# Patient Record
Sex: Female | Born: 1935 | Race: White | Hispanic: No | Smoking: Never smoker
Health system: Southern US, Community
[De-identification: ages and names within clinical notes are randomized; demographics above are authoritative.]

## PROBLEM LIST (undated history)

## (undated) DIAGNOSIS — Z950 Presence of cardiac pacemaker: Secondary | ICD-10-CM

## (undated) DIAGNOSIS — E119 Type 2 diabetes mellitus without complications: Secondary | ICD-10-CM

## (undated) DIAGNOSIS — I10 Essential (primary) hypertension: Secondary | ICD-10-CM

## (undated) DIAGNOSIS — L909 Atrophic disorder of skin, unspecified: Secondary | ICD-10-CM

## (undated) DIAGNOSIS — R7989 Other specified abnormal findings of blood chemistry: Secondary | ICD-10-CM

## (undated) DIAGNOSIS — I442 Atrioventricular block, complete: Secondary | ICD-10-CM

## (undated) DIAGNOSIS — R238 Other skin changes: Secondary | ICD-10-CM

## (undated) DIAGNOSIS — R945 Abnormal results of liver function studies: Secondary | ICD-10-CM

## (undated) DIAGNOSIS — F0390 Unspecified dementia without behavioral disturbance: Secondary | ICD-10-CM

## (undated) DIAGNOSIS — I493 Ventricular premature depolarization: Secondary | ICD-10-CM

## (undated) DIAGNOSIS — I2510 Atherosclerotic heart disease of native coronary artery without angina pectoris: Secondary | ICD-10-CM

## (undated) DIAGNOSIS — I482 Chronic atrial fibrillation, unspecified: Secondary | ICD-10-CM

## (undated) DIAGNOSIS — I255 Ischemic cardiomyopathy: Secondary | ICD-10-CM

## (undated) DIAGNOSIS — E785 Hyperlipidemia, unspecified: Secondary | ICD-10-CM

## (undated) HISTORY — DX: Ischemic cardiomyopathy: I25.5

## (undated) HISTORY — DX: Atherosclerotic heart disease of native coronary artery without angina pectoris: I25.10

## (undated) HISTORY — DX: Abnormal results of liver function studies: R94.5

## (undated) HISTORY — DX: Other specified abnormal findings of blood chemistry: R79.89

## (undated) HISTORY — DX: Hyperlipidemia, unspecified: E78.5

## (undated) HISTORY — DX: Atrioventricular block, complete: I44.2

## (undated) HISTORY — DX: Ventricular premature depolarization: I49.3

## (undated) HISTORY — DX: Essential (primary) hypertension: I10

## (undated) HISTORY — DX: Type 2 diabetes mellitus without complications: E11.9

## (undated) HISTORY — PX: OTHER SURGICAL HISTORY: SHX169

## (undated) HISTORY — DX: Chronic atrial fibrillation, unspecified: I48.20

## (undated) HISTORY — DX: Presence of cardiac pacemaker: Z95.0

---

## 1983-04-16 HISTORY — PX: CORONARY ARTERY BYPASS GRAFT: SHX141

## 1996-04-15 HISTORY — PX: CORONARY ARTERY BYPASS GRAFT: SHX141

## 1998-12-26 ENCOUNTER — Other Ambulatory Visit: Admission: RE | Admit: 1998-12-26 | Discharge: 1998-12-26 | Payer: Self-pay | Admitting: Internal Medicine

## 1999-04-02 ENCOUNTER — Encounter: Admission: RE | Admit: 1999-04-02 | Discharge: 1999-04-02 | Payer: Self-pay | Admitting: Internal Medicine

## 1999-04-02 ENCOUNTER — Encounter: Payer: Self-pay | Admitting: Internal Medicine

## 1999-09-20 ENCOUNTER — Encounter: Admission: RE | Admit: 1999-09-20 | Discharge: 1999-09-20 | Payer: Self-pay | Admitting: Internal Medicine

## 1999-09-20 ENCOUNTER — Encounter: Payer: Self-pay | Admitting: Internal Medicine

## 2000-03-24 ENCOUNTER — Encounter: Admission: RE | Admit: 2000-03-24 | Discharge: 2000-03-24 | Payer: Self-pay | Admitting: Internal Medicine

## 2000-03-24 ENCOUNTER — Encounter: Payer: Self-pay | Admitting: Internal Medicine

## 2001-02-03 ENCOUNTER — Other Ambulatory Visit: Admission: RE | Admit: 2001-02-03 | Discharge: 2001-02-03 | Payer: Self-pay | Admitting: Internal Medicine

## 2001-03-25 ENCOUNTER — Encounter: Payer: Self-pay | Admitting: Internal Medicine

## 2001-03-25 ENCOUNTER — Encounter: Admission: RE | Admit: 2001-03-25 | Discharge: 2001-03-25 | Payer: Self-pay | Admitting: Internal Medicine

## 2002-01-04 ENCOUNTER — Encounter: Payer: Self-pay | Admitting: Cardiology

## 2002-01-04 ENCOUNTER — Ambulatory Visit (HOSPITAL_COMMUNITY): Admission: RE | Admit: 2002-01-04 | Discharge: 2002-01-04 | Payer: Self-pay | Admitting: Cardiology

## 2002-03-09 ENCOUNTER — Inpatient Hospital Stay (HOSPITAL_COMMUNITY): Admission: RE | Admit: 2002-03-09 | Discharge: 2002-03-19 | Payer: Self-pay | Admitting: Cardiology

## 2002-03-12 ENCOUNTER — Encounter: Payer: Self-pay | Admitting: Internal Medicine

## 2002-03-16 ENCOUNTER — Encounter: Payer: Self-pay | Admitting: Internal Medicine

## 2002-08-19 ENCOUNTER — Encounter: Payer: Self-pay | Admitting: Internal Medicine

## 2002-08-19 ENCOUNTER — Encounter: Admission: RE | Admit: 2002-08-19 | Discharge: 2002-08-19 | Payer: Self-pay | Admitting: Internal Medicine

## 2003-01-04 ENCOUNTER — Encounter: Admission: RE | Admit: 2003-01-04 | Discharge: 2003-01-04 | Payer: Self-pay | Admitting: Specialist

## 2003-01-04 ENCOUNTER — Encounter: Payer: Self-pay | Admitting: Specialist

## 2003-01-11 ENCOUNTER — Emergency Department (HOSPITAL_COMMUNITY): Admission: EM | Admit: 2003-01-11 | Discharge: 2003-01-11 | Payer: Self-pay | Admitting: *Deleted

## 2003-01-11 ENCOUNTER — Encounter: Payer: Self-pay | Admitting: *Deleted

## 2003-11-15 ENCOUNTER — Encounter: Admission: RE | Admit: 2003-11-15 | Discharge: 2003-11-15 | Payer: Self-pay | Admitting: Internal Medicine

## 2004-02-20 ENCOUNTER — Ambulatory Visit: Payer: Self-pay | Admitting: Cardiology

## 2004-03-07 ENCOUNTER — Ambulatory Visit: Payer: Self-pay

## 2004-03-14 ENCOUNTER — Ambulatory Visit: Payer: Self-pay | Admitting: Cardiology

## 2004-04-04 ENCOUNTER — Ambulatory Visit: Payer: Self-pay | Admitting: Cardiology

## 2004-04-30 ENCOUNTER — Ambulatory Visit: Payer: Self-pay | Admitting: Cardiology

## 2004-05-02 ENCOUNTER — Ambulatory Visit: Payer: Self-pay | Admitting: Cardiology

## 2004-05-30 ENCOUNTER — Ambulatory Visit: Payer: Self-pay | Admitting: Cardiovascular Disease

## 2004-06-27 ENCOUNTER — Ambulatory Visit: Payer: Self-pay | Admitting: *Deleted

## 2004-07-18 ENCOUNTER — Ambulatory Visit: Payer: Self-pay | Admitting: Cardiology

## 2004-07-18 ENCOUNTER — Ambulatory Visit: Payer: Self-pay | Admitting: *Deleted

## 2004-07-20 ENCOUNTER — Ambulatory Visit: Payer: Self-pay | Admitting: Cardiology

## 2004-08-06 ENCOUNTER — Ambulatory Visit: Payer: Self-pay | Admitting: Cardiology

## 2004-08-16 ENCOUNTER — Ambulatory Visit: Payer: Self-pay | Admitting: Cardiology

## 2004-08-27 ENCOUNTER — Ambulatory Visit: Payer: Self-pay | Admitting: Cardiology

## 2004-09-13 ENCOUNTER — Ambulatory Visit: Payer: Self-pay | Admitting: Cardiology

## 2004-09-24 ENCOUNTER — Ambulatory Visit: Payer: Self-pay | Admitting: Internal Medicine

## 2004-10-01 ENCOUNTER — Ambulatory Visit: Payer: Self-pay | Admitting: Cardiology

## 2004-10-15 ENCOUNTER — Ambulatory Visit: Payer: Self-pay | Admitting: Cardiology

## 2004-10-17 ENCOUNTER — Ambulatory Visit: Payer: Self-pay | Admitting: Cardiology

## 2004-11-12 ENCOUNTER — Ambulatory Visit: Payer: Self-pay | Admitting: Cardiology

## 2004-12-10 ENCOUNTER — Ambulatory Visit: Payer: Self-pay | Admitting: Cardiology

## 2004-12-18 ENCOUNTER — Ambulatory Visit: Payer: Self-pay | Admitting: Cardiology

## 2005-01-03 ENCOUNTER — Encounter: Admission: RE | Admit: 2005-01-03 | Discharge: 2005-01-03 | Payer: Self-pay | Admitting: Internal Medicine

## 2005-01-08 ENCOUNTER — Ambulatory Visit: Payer: Self-pay | Admitting: Cardiology

## 2005-01-15 ENCOUNTER — Ambulatory Visit: Payer: Self-pay | Admitting: Cardiology

## 2005-01-16 ENCOUNTER — Ambulatory Visit: Payer: Self-pay | Admitting: Cardiology

## 2005-01-25 ENCOUNTER — Ambulatory Visit: Payer: Self-pay

## 2005-02-05 ENCOUNTER — Ambulatory Visit: Payer: Self-pay | Admitting: Cardiology

## 2005-02-08 ENCOUNTER — Ambulatory Visit: Payer: Self-pay | Admitting: Cardiology

## 2005-02-12 ENCOUNTER — Ambulatory Visit: Payer: Self-pay | Admitting: Cardiology

## 2005-02-13 ENCOUNTER — Inpatient Hospital Stay (HOSPITAL_BASED_OUTPATIENT_CLINIC_OR_DEPARTMENT_OTHER): Admission: RE | Admit: 2005-02-13 | Discharge: 2005-02-13 | Payer: Self-pay | Admitting: Cardiology

## 2005-02-13 ENCOUNTER — Ambulatory Visit: Payer: Self-pay | Admitting: Cardiology

## 2005-02-18 ENCOUNTER — Ambulatory Visit: Payer: Self-pay

## 2005-02-19 ENCOUNTER — Ambulatory Visit: Payer: Self-pay | Admitting: Internal Medicine

## 2005-02-20 ENCOUNTER — Ambulatory Visit: Payer: Self-pay | Admitting: Cardiology

## 2005-03-04 ENCOUNTER — Ambulatory Visit: Payer: Self-pay | Admitting: Cardiology

## 2005-03-18 ENCOUNTER — Ambulatory Visit: Payer: Self-pay | Admitting: Cardiology

## 2005-04-03 ENCOUNTER — Ambulatory Visit: Payer: Self-pay | Admitting: Cardiology

## 2005-04-03 ENCOUNTER — Ambulatory Visit: Payer: Self-pay | Admitting: Internal Medicine

## 2005-04-12 ENCOUNTER — Ambulatory Visit: Payer: Self-pay | Admitting: Cardiology

## 2005-05-01 ENCOUNTER — Ambulatory Visit: Payer: Self-pay | Admitting: Cardiology

## 2005-05-07 ENCOUNTER — Ambulatory Visit: Payer: Self-pay | Admitting: Cardiology

## 2005-05-17 ENCOUNTER — Ambulatory Visit: Payer: Self-pay | Admitting: Cardiology

## 2005-06-12 ENCOUNTER — Ambulatory Visit: Payer: Self-pay | Admitting: Internal Medicine

## 2005-07-09 ENCOUNTER — Ambulatory Visit: Payer: Self-pay | Admitting: Cardiology

## 2005-07-10 ENCOUNTER — Ambulatory Visit: Payer: Self-pay | Admitting: *Deleted

## 2005-07-28 ENCOUNTER — Emergency Department (HOSPITAL_COMMUNITY): Admission: EM | Admit: 2005-07-28 | Discharge: 2005-07-28 | Payer: Self-pay | Admitting: Emergency Medicine

## 2005-07-31 ENCOUNTER — Ambulatory Visit: Payer: Self-pay | Admitting: *Deleted

## 2005-08-12 ENCOUNTER — Ambulatory Visit: Payer: Self-pay | Admitting: Cardiology

## 2005-08-16 ENCOUNTER — Ambulatory Visit (HOSPITAL_COMMUNITY): Admission: RE | Admit: 2005-08-16 | Discharge: 2005-08-16 | Payer: Self-pay | Admitting: Cardiology

## 2005-08-20 ENCOUNTER — Ambulatory Visit (HOSPITAL_COMMUNITY): Admission: RE | Admit: 2005-08-20 | Discharge: 2005-08-20 | Payer: Self-pay | Admitting: Cardiology

## 2005-08-20 ENCOUNTER — Ambulatory Visit: Payer: Self-pay | Admitting: Cardiology

## 2005-09-03 ENCOUNTER — Ambulatory Visit: Payer: Self-pay | Admitting: *Deleted

## 2005-09-13 ENCOUNTER — Ambulatory Visit: Payer: Self-pay | Admitting: Cardiology

## 2005-09-27 ENCOUNTER — Ambulatory Visit: Payer: Self-pay | Admitting: Cardiology

## 2005-10-18 ENCOUNTER — Ambulatory Visit: Payer: Self-pay | Admitting: Internal Medicine

## 2005-11-15 ENCOUNTER — Ambulatory Visit: Payer: Self-pay | Admitting: Cardiology

## 2005-12-01 ENCOUNTER — Emergency Department (HOSPITAL_COMMUNITY): Admission: AD | Admit: 2005-12-01 | Discharge: 2005-12-01 | Payer: Self-pay | Admitting: Family Medicine

## 2005-12-13 ENCOUNTER — Ambulatory Visit: Payer: Self-pay | Admitting: Cardiology

## 2006-01-03 ENCOUNTER — Ambulatory Visit: Payer: Self-pay | Admitting: Cardiology

## 2006-01-06 ENCOUNTER — Encounter: Admission: RE | Admit: 2006-01-06 | Discharge: 2006-01-06 | Payer: Self-pay | Admitting: Internal Medicine

## 2006-01-29 ENCOUNTER — Ambulatory Visit: Payer: Self-pay | Admitting: Internal Medicine

## 2006-02-14 ENCOUNTER — Ambulatory Visit: Payer: Self-pay | Admitting: Cardiology

## 2006-02-19 ENCOUNTER — Emergency Department (HOSPITAL_COMMUNITY): Admission: EM | Admit: 2006-02-19 | Discharge: 2006-02-19 | Payer: Self-pay | Admitting: Emergency Medicine

## 2006-02-25 ENCOUNTER — Ambulatory Visit: Payer: Self-pay | Admitting: *Deleted

## 2006-03-11 ENCOUNTER — Ambulatory Visit: Payer: Self-pay | Admitting: Cardiology

## 2006-03-24 ENCOUNTER — Ambulatory Visit: Payer: Self-pay | Admitting: Cardiology

## 2006-04-01 ENCOUNTER — Ambulatory Visit: Payer: Self-pay | Admitting: Cardiology

## 2006-04-16 ENCOUNTER — Ambulatory Visit: Payer: Self-pay | Admitting: *Deleted

## 2006-04-23 ENCOUNTER — Ambulatory Visit: Payer: Self-pay | Admitting: Cardiology

## 2006-05-14 ENCOUNTER — Ambulatory Visit: Payer: Self-pay | Admitting: Internal Medicine

## 2006-05-21 ENCOUNTER — Ambulatory Visit: Payer: Self-pay | Admitting: Cardiology

## 2006-06-04 ENCOUNTER — Ambulatory Visit: Payer: Self-pay | Admitting: Cardiovascular Disease

## 2006-06-18 ENCOUNTER — Ambulatory Visit: Payer: Self-pay | Admitting: *Deleted

## 2006-07-09 ENCOUNTER — Ambulatory Visit: Payer: Self-pay | Admitting: Cardiovascular Disease

## 2006-07-18 ENCOUNTER — Ambulatory Visit: Payer: Self-pay | Admitting: Cardiology

## 2006-08-01 ENCOUNTER — Ambulatory Visit: Payer: Self-pay | Admitting: Cardiology

## 2006-08-11 ENCOUNTER — Ambulatory Visit: Payer: Self-pay | Admitting: Cardiology

## 2006-08-29 ENCOUNTER — Ambulatory Visit: Payer: Self-pay | Admitting: Cardiology

## 2006-09-16 ENCOUNTER — Ambulatory Visit: Payer: Self-pay

## 2006-09-16 ENCOUNTER — Ambulatory Visit: Payer: Self-pay | Admitting: Cardiology

## 2006-09-16 ENCOUNTER — Encounter: Payer: Self-pay | Admitting: Cardiology

## 2006-10-07 ENCOUNTER — Ambulatory Visit: Payer: Self-pay | Admitting: Cardiology

## 2006-10-30 ENCOUNTER — Ambulatory Visit: Payer: Self-pay | Admitting: Cardiology

## 2006-11-04 ENCOUNTER — Ambulatory Visit: Payer: Self-pay | Admitting: Internal Medicine

## 2006-11-18 ENCOUNTER — Ambulatory Visit: Payer: Self-pay | Admitting: Cardiology

## 2006-12-09 ENCOUNTER — Ambulatory Visit: Payer: Self-pay | Admitting: Cardiology

## 2006-12-23 ENCOUNTER — Ambulatory Visit: Payer: Self-pay | Admitting: Cardiology

## 2006-12-25 ENCOUNTER — Ambulatory Visit: Payer: Self-pay | Admitting: Cardiology

## 2007-01-08 ENCOUNTER — Encounter: Admission: RE | Admit: 2007-01-08 | Discharge: 2007-01-08 | Payer: Self-pay | Admitting: Internal Medicine

## 2007-01-13 ENCOUNTER — Ambulatory Visit: Payer: Self-pay | Admitting: Cardiology

## 2007-01-27 ENCOUNTER — Ambulatory Visit: Payer: Self-pay | Admitting: Cardiology

## 2007-02-10 ENCOUNTER — Ambulatory Visit: Payer: Self-pay | Admitting: Cardiology

## 2007-02-19 ENCOUNTER — Ambulatory Visit: Payer: Self-pay | Admitting: Cardiology

## 2007-02-24 ENCOUNTER — Ambulatory Visit: Payer: Self-pay | Admitting: Cardiovascular Disease

## 2007-03-10 ENCOUNTER — Ambulatory Visit: Payer: Self-pay | Admitting: Internal Medicine

## 2007-03-17 ENCOUNTER — Ambulatory Visit: Payer: Self-pay | Admitting: Cardiology

## 2007-03-17 LAB — CONVERTED CEMR LAB
BUN: 24 mg/dL — ABNORMAL HIGH (ref 6–23)
CO2: 27 meq/L (ref 19–32)
Calcium: 9.6 mg/dL (ref 8.4–10.5)
Eosinophils Relative: 0.8 % (ref 0.0–5.0)
GFR calc Af Amer: 43 mL/min
GFR calc non Af Amer: 35 mL/min
Lymphocytes Relative: 23.7 % (ref 12.0–46.0)
MCV: 94.5 fL (ref 78.0–100.0)
Neutro Abs: 4.5 10*3/uL (ref 1.4–7.7)
Platelets: 222 10*3/uL (ref 150–400)
Potassium: 4.1 meq/L (ref 3.5–5.1)
Pro B Natriuretic peptide (BNP): 615 pg/mL — ABNORMAL HIGH (ref 0.0–100.0)
RBC: 3.55 M/uL — ABNORMAL LOW (ref 3.87–5.11)
RDW: 12.9 % (ref 11.5–14.6)
Sodium: 139 meq/L (ref 135–145)
TSH: 2.52 microintl units/mL (ref 0.35–5.50)

## 2007-04-01 ENCOUNTER — Ambulatory Visit: Payer: Self-pay | Admitting: Cardiovascular Disease

## 2007-04-03 ENCOUNTER — Observation Stay (HOSPITAL_COMMUNITY): Admission: EM | Admit: 2007-04-03 | Discharge: 2007-04-06 | Payer: Self-pay | Admitting: Emergency Medicine

## 2007-04-03 ENCOUNTER — Ambulatory Visit: Payer: Self-pay | Admitting: Cardiology

## 2007-04-14 ENCOUNTER — Ambulatory Visit: Payer: Self-pay

## 2007-04-14 ENCOUNTER — Encounter: Payer: Self-pay | Admitting: Cardiology

## 2007-04-14 ENCOUNTER — Ambulatory Visit: Payer: Self-pay | Admitting: Internal Medicine

## 2007-04-14 LAB — CONVERTED CEMR LAB
Creatinine, Ser: 1.4 mg/dL — ABNORMAL HIGH (ref 0.4–1.2)
GFR calc Af Amer: 46 mL/min
Potassium: 4.7 meq/L (ref 3.5–5.1)
Sodium: 140 meq/L (ref 135–145)

## 2007-04-17 ENCOUNTER — Ambulatory Visit: Payer: Self-pay | Admitting: Cardiology

## 2007-04-24 ENCOUNTER — Encounter: Admission: RE | Admit: 2007-04-24 | Discharge: 2007-04-24 | Payer: Self-pay | Admitting: Internal Medicine

## 2007-04-27 ENCOUNTER — Ambulatory Visit: Payer: Self-pay | Admitting: Cardiology

## 2007-04-27 LAB — CONVERTED CEMR LAB
BUN: 34 mg/dL — ABNORMAL HIGH (ref 6–23)
Basophils Absolute: 0 10*3/uL (ref 0.0–0.1)
Chloride: 101 meq/L (ref 96–112)
Eosinophils Absolute: 0 10*3/uL (ref 0.0–0.6)
Eosinophils Relative: 0.7 % (ref 0.0–5.0)
Glucose, Bld: 108 mg/dL — ABNORMAL HIGH (ref 70–99)
HCT: 37.9 % (ref 36.0–46.0)
Hemoglobin: 13 g/dL (ref 12.0–15.0)
Monocytes Absolute: 0.6 10*3/uL (ref 0.2–0.7)
Neutro Abs: 4.7 10*3/uL (ref 1.4–7.7)
Neutrophils Relative %: 66.5 % (ref 43.0–77.0)
Platelets: 167 10*3/uL (ref 150–400)
Sodium: 141 meq/L (ref 135–145)
WBC: 7 10*3/uL (ref 4.5–10.5)

## 2007-04-29 ENCOUNTER — Ambulatory Visit: Payer: Self-pay | Admitting: Cardiology

## 2007-04-29 LAB — CONVERTED CEMR LAB
Prothrombin Time: 20.4 s — ABNORMAL HIGH (ref 10.9–13.3)
aPTT: 35.4 s — ABNORMAL HIGH (ref 21.7–29.8)

## 2007-04-30 ENCOUNTER — Inpatient Hospital Stay (HOSPITAL_COMMUNITY): Admission: AD | Admit: 2007-04-30 | Discharge: 2007-05-01 | Payer: Self-pay | Admitting: Cardiology

## 2007-04-30 ENCOUNTER — Ambulatory Visit: Payer: Self-pay | Admitting: Cardiology

## 2007-04-30 ENCOUNTER — Ambulatory Visit: Payer: Self-pay | Admitting: Internal Medicine

## 2007-05-04 ENCOUNTER — Inpatient Hospital Stay (HOSPITAL_COMMUNITY): Admission: AD | Admit: 2007-05-04 | Discharge: 2007-05-06 | Payer: Self-pay | Admitting: Internal Medicine

## 2007-05-04 ENCOUNTER — Ambulatory Visit: Payer: Self-pay | Admitting: Internal Medicine

## 2007-05-11 ENCOUNTER — Ambulatory Visit: Payer: Self-pay | Admitting: Cardiology

## 2007-05-19 ENCOUNTER — Ambulatory Visit: Payer: Self-pay | Admitting: Cardiology

## 2007-05-19 ENCOUNTER — Ambulatory Visit: Payer: Self-pay | Admitting: Cardiovascular Disease

## 2007-05-19 LAB — CONVERTED CEMR LAB
Calcium: 9.9 mg/dL (ref 8.4–10.5)
Chloride: 102 meq/L (ref 96–112)
Eosinophils Relative: 2.1 % (ref 0.0–5.0)
GFR calc Af Amer: 39 mL/min
GFR calc non Af Amer: 33 mL/min
Glucose, Bld: 103 mg/dL — ABNORMAL HIGH (ref 70–99)
HCT: 37.9 % (ref 36.0–46.0)
Hemoglobin: 12.4 g/dL (ref 12.0–15.0)
Lymphocytes Relative: 25.7 % (ref 12.0–46.0)
Monocytes Absolute: 0.7 10*3/uL (ref 0.2–0.7)
Neutrophils Relative %: 62.7 % (ref 43.0–77.0)
Pro B Natriuretic peptide (BNP): 544 pg/mL — ABNORMAL HIGH (ref 0.0–100.0)
RBC: 4.07 M/uL (ref 3.87–5.11)
WBC: 7.4 10*3/uL (ref 4.5–10.5)

## 2007-06-03 ENCOUNTER — Ambulatory Visit: Payer: Self-pay | Admitting: Internal Medicine

## 2007-06-03 ENCOUNTER — Ambulatory Visit: Payer: Self-pay

## 2007-06-03 LAB — CONVERTED CEMR LAB
Calcium: 9.1 mg/dL (ref 8.4–10.5)
Chloride: 101 meq/L (ref 96–112)
GFR calc Af Amer: 39 mL/min
GFR calc non Af Amer: 33 mL/min
Glucose, Bld: 116 mg/dL — ABNORMAL HIGH (ref 70–99)
Sodium: 139 meq/L (ref 135–145)

## 2007-06-09 ENCOUNTER — Ambulatory Visit: Payer: Self-pay | Admitting: Cardiology

## 2007-07-02 ENCOUNTER — Ambulatory Visit: Payer: Self-pay | Admitting: Cardiology

## 2007-07-02 ENCOUNTER — Ambulatory Visit: Payer: Self-pay | Admitting: Internal Medicine

## 2007-07-02 LAB — CONVERTED CEMR LAB
Basophils Relative: 1.1 % — ABNORMAL HIGH (ref 0.0–1.0)
CO2: 29 meq/L (ref 19–32)
Eosinophils Absolute: 0.1 10*3/uL (ref 0.0–0.6)
Eosinophils Relative: 1.3 % (ref 0.0–5.0)
Glucose, Bld: 106 mg/dL — ABNORMAL HIGH (ref 70–99)
HCT: 34.4 % — ABNORMAL LOW (ref 36.0–46.0)
Hemoglobin: 11.4 g/dL — ABNORMAL LOW (ref 12.0–15.0)
MCHC: 33.2 g/dL (ref 30.0–36.0)
Monocytes Absolute: 0.6 10*3/uL (ref 0.2–0.7)
Neutro Abs: 4.2 10*3/uL (ref 1.4–7.7)
Potassium: 4 meq/L (ref 3.5–5.1)
RDW: 14.8 % — ABNORMAL HIGH (ref 11.5–14.6)
WBC: 6.9 10*3/uL (ref 4.5–10.5)

## 2007-07-10 ENCOUNTER — Ambulatory Visit: Payer: Self-pay | Admitting: Cardiology

## 2007-07-10 ENCOUNTER — Ambulatory Visit: Payer: Self-pay | Admitting: Internal Medicine

## 2007-07-10 LAB — CONVERTED CEMR LAB
Chloride: 106 meq/L (ref 96–112)
Glucose, Bld: 98 mg/dL (ref 70–99)
Potassium: 4.3 meq/L (ref 3.5–5.1)

## 2007-07-23 ENCOUNTER — Ambulatory Visit: Payer: Self-pay | Admitting: Internal Medicine

## 2007-07-30 ENCOUNTER — Ambulatory Visit: Payer: Self-pay | Admitting: Cardiology

## 2007-08-07 ENCOUNTER — Ambulatory Visit: Payer: Self-pay | Admitting: Internal Medicine

## 2007-08-07 LAB — CONVERTED CEMR LAB
BUN: 26 mg/dL — ABNORMAL HIGH (ref 6–23)
CO2: 28 meq/L (ref 19–32)
Chloride: 110 meq/L (ref 96–112)
GFR calc Af Amer: 55 mL/min
GFR calc non Af Amer: 45 mL/min
Glucose, Bld: 120 mg/dL — ABNORMAL HIGH (ref 70–99)
Potassium: 4.4 meq/L (ref 3.5–5.1)
Sodium: 143 meq/L (ref 135–145)

## 2007-08-13 ENCOUNTER — Ambulatory Visit: Payer: Self-pay | Admitting: Cardiology

## 2007-08-24 ENCOUNTER — Ambulatory Visit: Payer: Self-pay | Admitting: Cardiology

## 2007-09-14 ENCOUNTER — Ambulatory Visit: Payer: Self-pay | Admitting: Cardiovascular Disease

## 2007-10-12 ENCOUNTER — Ambulatory Visit: Payer: Self-pay | Admitting: Cardiology

## 2007-10-29 ENCOUNTER — Ambulatory Visit: Payer: Self-pay | Admitting: Cardiology

## 2007-10-29 ENCOUNTER — Ambulatory Visit: Payer: Self-pay | Admitting: Cardiovascular Disease

## 2007-10-29 LAB — CONVERTED CEMR LAB
Basophils Relative: 1 % (ref 0.0–3.0)
Calcium: 9.4 mg/dL (ref 8.4–10.5)
Eosinophils Relative: 2.6 % (ref 0.0–5.0)
GFR calc Af Amer: 46 mL/min
GFR calc non Af Amer: 38 mL/min
Glucose, Bld: 102 mg/dL — ABNORMAL HIGH (ref 70–99)
HCT: 32.1 % — ABNORMAL LOW (ref 36.0–46.0)
Hemoglobin: 10.8 g/dL — ABNORMAL LOW (ref 12.0–15.0)
Lymphocytes Relative: 28.6 % (ref 12.0–46.0)
MCV: 94.4 fL (ref 78.0–100.0)
Neutro Abs: 3.6 10*3/uL (ref 1.4–7.7)
Neutrophils Relative %: 58.9 % (ref 43.0–77.0)
RDW: 13 % (ref 11.5–14.6)

## 2007-11-09 ENCOUNTER — Ambulatory Visit: Payer: Self-pay | Admitting: Cardiology

## 2007-11-09 ENCOUNTER — Ambulatory Visit: Payer: Self-pay | Admitting: Cardiovascular Disease

## 2007-11-09 LAB — CONVERTED CEMR LAB
BUN: 26 mg/dL — ABNORMAL HIGH (ref 6–23)
Calcium: 9.3 mg/dL (ref 8.4–10.5)
Creatinine, Ser: 1.4 mg/dL — ABNORMAL HIGH (ref 0.4–1.2)
Sodium: 143 meq/L (ref 135–145)

## 2007-11-24 ENCOUNTER — Ambulatory Visit: Payer: Self-pay | Admitting: Cardiovascular Disease

## 2007-12-04 ENCOUNTER — Ambulatory Visit: Payer: Self-pay | Admitting: Internal Medicine

## 2007-12-25 ENCOUNTER — Ambulatory Visit: Payer: Self-pay | Admitting: Internal Medicine

## 2008-01-08 ENCOUNTER — Ambulatory Visit: Payer: Self-pay | Admitting: Cardiology

## 2008-01-21 ENCOUNTER — Encounter: Admission: RE | Admit: 2008-01-21 | Discharge: 2008-01-21 | Payer: Self-pay | Admitting: Internal Medicine

## 2008-01-27 ENCOUNTER — Ambulatory Visit: Payer: Self-pay | Admitting: Cardiology

## 2008-01-27 LAB — CONVERTED CEMR LAB
Basophils Absolute: 0 10*3/uL (ref 0.0–0.1)
Chloride: 111 meq/L (ref 96–112)
Creatinine, Ser: 1.2 mg/dL (ref 0.4–1.2)
GFR calc non Af Amer: 45 mL/min
HCT: 31.9 % — ABNORMAL LOW (ref 36.0–46.0)
Hemoglobin: 10.7 g/dL — ABNORMAL LOW (ref 12.0–15.0)
MCV: 95.1 fL (ref 78.0–100.0)
Monocytes Absolute: 0.5 10*3/uL (ref 0.1–1.0)
Neutro Abs: 3.9 10*3/uL (ref 1.4–7.7)
Neutrophils Relative %: 61.9 % (ref 43.0–77.0)
Potassium: 4.4 meq/L (ref 3.5–5.1)
RBC: 3.35 M/uL — ABNORMAL LOW (ref 3.87–5.11)
RDW: 12.4 % (ref 11.5–14.6)

## 2008-01-28 ENCOUNTER — Encounter: Admission: RE | Admit: 2008-01-28 | Discharge: 2008-01-28 | Payer: Self-pay | Admitting: Internal Medicine

## 2008-02-12 ENCOUNTER — Ambulatory Visit: Payer: Self-pay | Admitting: Internal Medicine

## 2008-02-12 ENCOUNTER — Ambulatory Visit: Payer: Self-pay | Admitting: Cardiology

## 2008-02-12 LAB — CONVERTED CEMR LAB
Chloride: 106 meq/L (ref 96–112)
Creatinine, Ser: 1.2 mg/dL (ref 0.4–1.2)
GFR calc Af Amer: 55 mL/min
Glucose, Bld: 95 mg/dL (ref 70–99)
Sodium: 140 meq/L (ref 135–145)

## 2008-03-11 ENCOUNTER — Ambulatory Visit: Payer: Self-pay | Admitting: Internal Medicine

## 2008-04-06 ENCOUNTER — Ambulatory Visit: Payer: Self-pay | Admitting: Cardiovascular Disease

## 2008-04-09 DIAGNOSIS — I110 Hypertensive heart disease with heart failure: Secondary | ICD-10-CM | POA: Insufficient documentation

## 2008-04-09 DIAGNOSIS — I4891 Unspecified atrial fibrillation: Secondary | ICD-10-CM | POA: Insufficient documentation

## 2008-04-09 DIAGNOSIS — E119 Type 2 diabetes mellitus without complications: Secondary | ICD-10-CM

## 2008-04-09 DIAGNOSIS — I25810 Atherosclerosis of coronary artery bypass graft(s) without angina pectoris: Secondary | ICD-10-CM

## 2008-04-09 DIAGNOSIS — E785 Hyperlipidemia, unspecified: Secondary | ICD-10-CM

## 2008-04-09 DIAGNOSIS — Z950 Presence of cardiac pacemaker: Secondary | ICD-10-CM

## 2008-04-28 ENCOUNTER — Encounter (INDEPENDENT_AMBULATORY_CARE_PROVIDER_SITE_OTHER): Payer: Self-pay | Admitting: *Deleted

## 2008-05-04 ENCOUNTER — Ambulatory Visit: Payer: Self-pay | Admitting: Internal Medicine

## 2008-05-19 ENCOUNTER — Ambulatory Visit: Payer: Self-pay | Admitting: Cardiology

## 2008-05-19 ENCOUNTER — Ambulatory Visit: Payer: Self-pay | Admitting: Internal Medicine

## 2008-05-19 LAB — CONVERTED CEMR LAB
Albumin: 3.8 g/dL (ref 3.5–5.2)
Alkaline Phosphatase: 42 units/L (ref 39–117)
Basophils Absolute: 0.1 10*3/uL (ref 0.0–0.1)
Basophils Relative: 0.7 % (ref 0.0–3.0)
Bilirubin, Direct: 0.1 mg/dL (ref 0.0–0.3)
Calcium: 9.4 mg/dL (ref 8.4–10.5)
Cholesterol: 124 mg/dL (ref 0–200)
GFR calc Af Amer: 42 mL/min
HCT: 32.9 % — ABNORMAL LOW (ref 36.0–46.0)
MCV: 93.9 fL (ref 78.0–100.0)
Neutro Abs: 4.9 10*3/uL (ref 1.4–7.7)
RBC: 3.51 M/uL — ABNORMAL LOW (ref 3.87–5.11)
Sodium: 140 meq/L (ref 135–145)
Total Bilirubin: 0.5 mg/dL (ref 0.3–1.2)
Total CHOL/HDL Ratio: 2.4

## 2008-05-30 ENCOUNTER — Encounter: Payer: Self-pay | Admitting: Cardiology

## 2008-05-30 ENCOUNTER — Ambulatory Visit: Payer: Self-pay

## 2008-06-13 ENCOUNTER — Ambulatory Visit: Payer: Self-pay | Admitting: Cardiology

## 2008-07-11 ENCOUNTER — Ambulatory Visit: Payer: Self-pay | Admitting: Cardiology

## 2008-08-08 ENCOUNTER — Ambulatory Visit: Payer: Self-pay | Admitting: Cardiology

## 2008-08-18 ENCOUNTER — Telehealth: Payer: Self-pay | Admitting: Cardiology

## 2008-08-31 ENCOUNTER — Ambulatory Visit: Payer: Self-pay | Admitting: Cardiology

## 2008-09-13 ENCOUNTER — Encounter: Payer: Self-pay | Admitting: *Deleted

## 2008-09-15 ENCOUNTER — Telehealth: Payer: Self-pay | Admitting: Cardiology

## 2008-09-19 ENCOUNTER — Ambulatory Visit: Payer: Self-pay | Admitting: Cardiology

## 2008-09-27 ENCOUNTER — Emergency Department (HOSPITAL_COMMUNITY): Admission: EM | Admit: 2008-09-27 | Discharge: 2008-09-27 | Payer: Self-pay | Admitting: Emergency Medicine

## 2008-09-28 ENCOUNTER — Ambulatory Visit: Payer: Self-pay | Admitting: Cardiology

## 2008-09-29 ENCOUNTER — Encounter: Payer: Self-pay | Admitting: Cardiology

## 2008-10-07 ENCOUNTER — Encounter (INDEPENDENT_AMBULATORY_CARE_PROVIDER_SITE_OTHER): Payer: Self-pay | Admitting: Cardiology

## 2008-10-07 ENCOUNTER — Ambulatory Visit: Payer: Self-pay | Admitting: Internal Medicine

## 2008-10-07 LAB — CONVERTED CEMR LAB: POC INR: 1.5

## 2008-10-19 ENCOUNTER — Encounter: Payer: Self-pay | Admitting: *Deleted

## 2008-10-21 ENCOUNTER — Ambulatory Visit: Payer: Self-pay | Admitting: Cardiology

## 2008-10-24 ENCOUNTER — Encounter: Admission: RE | Admit: 2008-10-24 | Discharge: 2008-10-24 | Payer: Self-pay | Admitting: Internal Medicine

## 2008-11-04 ENCOUNTER — Ambulatory Visit: Payer: Self-pay | Admitting: Internal Medicine

## 2008-11-04 LAB — CONVERTED CEMR LAB
POC INR: 1.9
Prothrombin Time: 17 s

## 2008-11-14 ENCOUNTER — Ambulatory Visit: Payer: Self-pay | Admitting: Cardiology

## 2008-11-14 ENCOUNTER — Ambulatory Visit: Payer: Self-pay | Admitting: Internal Medicine

## 2008-11-14 LAB — CONVERTED CEMR LAB
Calcium: 9 mg/dL (ref 8.4–10.5)
GFR calc non Af Amer: 41.23 mL/min (ref >60)
POC INR: 2
Prothrombin Time: 17.3 s
Sodium: 142 meq/L (ref 135–145)

## 2008-11-30 ENCOUNTER — Ambulatory Visit: Payer: Self-pay | Admitting: Cardiology

## 2008-11-30 LAB — CONVERTED CEMR LAB: POC INR: 2.1

## 2008-12-28 ENCOUNTER — Ambulatory Visit: Payer: Self-pay | Admitting: Internal Medicine

## 2008-12-28 LAB — CONVERTED CEMR LAB: POC INR: 2.9

## 2009-01-09 ENCOUNTER — Ambulatory Visit: Payer: Self-pay | Admitting: Cardiology

## 2009-01-25 ENCOUNTER — Ambulatory Visit: Payer: Self-pay | Admitting: Internal Medicine

## 2009-01-25 LAB — CONVERTED CEMR LAB: POC INR: 2.9

## 2009-02-06 ENCOUNTER — Encounter: Admission: RE | Admit: 2009-02-06 | Discharge: 2009-02-06 | Payer: Self-pay | Admitting: Internal Medicine

## 2009-02-22 ENCOUNTER — Ambulatory Visit: Payer: Self-pay | Admitting: Internal Medicine

## 2009-03-22 ENCOUNTER — Ambulatory Visit: Payer: Self-pay

## 2009-04-10 ENCOUNTER — Ambulatory Visit: Payer: Self-pay | Admitting: Cardiology

## 2009-04-19 ENCOUNTER — Ambulatory Visit: Payer: Self-pay | Admitting: Cardiovascular Disease

## 2009-04-19 LAB — CONVERTED CEMR LAB: POC INR: 2.3

## 2009-05-22 ENCOUNTER — Ambulatory Visit: Payer: Self-pay | Admitting: Cardiology

## 2009-05-22 LAB — CONVERTED CEMR LAB: POC INR: 3.4

## 2009-05-23 LAB — CONVERTED CEMR LAB
ALT: 18 units/L (ref 0–35)
AST: 29 units/L (ref 0–37)
Albumin: 3.9 g/dL (ref 3.5–5.2)
Alkaline Phosphatase: 45 units/L (ref 39–117)
Basophils Absolute: 0.1 10*3/uL (ref 0.0–0.1)
Basophils Relative: 1 % (ref 0.0–3.0)
Calcium: 9.1 mg/dL (ref 8.4–10.5)
Cholesterol: 145 mg/dL (ref 0–200)
Eosinophils Relative: 3.1 % (ref 0.0–5.0)
GFR calc non Af Amer: 41.18 mL/min (ref >60)
Glucose, Bld: 83 mg/dL (ref 70–99)
HCT: 32.8 % — ABNORMAL LOW (ref 36.0–46.0)
Hemoglobin: 10.7 g/dL — ABNORMAL LOW (ref 12.0–15.0)
LDL Cholesterol: 71 mg/dL (ref 0–99)
Lymphocytes Relative: 27.2 % (ref 12.0–46.0)
Lymphs Abs: 1.5 10*3/uL (ref 0.7–4.0)
Monocytes Relative: 8.6 % (ref 3.0–12.0)
Neutro Abs: 3.1 10*3/uL (ref 1.4–7.7)
Potassium: 4.1 meq/L (ref 3.5–5.1)
RBC: 3.4 M/uL — ABNORMAL LOW (ref 3.87–5.11)
Sodium: 143 meq/L (ref 135–145)
TSH: 1.83 microintl units/mL (ref 0.35–5.50)
VLDL: 15 mg/dL (ref 0.0–40.0)
WBC: 5.4 10*3/uL (ref 4.5–10.5)

## 2009-05-30 ENCOUNTER — Telehealth: Payer: Self-pay | Admitting: Cardiology

## 2009-06-16 ENCOUNTER — Ambulatory Visit: Payer: Self-pay | Admitting: Cardiology

## 2009-06-16 ENCOUNTER — Encounter (INDEPENDENT_AMBULATORY_CARE_PROVIDER_SITE_OTHER): Payer: Self-pay | Admitting: Cardiology

## 2009-06-16 LAB — CONVERTED CEMR LAB: POC INR: 2.3

## 2009-07-14 ENCOUNTER — Ambulatory Visit: Payer: Self-pay | Admitting: Internal Medicine

## 2009-07-14 LAB — CONVERTED CEMR LAB: POC INR: 3.2

## 2009-08-07 ENCOUNTER — Ambulatory Visit: Payer: Self-pay | Admitting: Cardiovascular Disease

## 2009-09-04 ENCOUNTER — Ambulatory Visit: Payer: Self-pay | Admitting: Cardiology

## 2009-09-04 LAB — CONVERTED CEMR LAB: POC INR: 2.4

## 2009-10-02 ENCOUNTER — Ambulatory Visit: Payer: Self-pay | Admitting: Cardiology

## 2009-10-02 LAB — CONVERTED CEMR LAB: POC INR: 2.1

## 2009-10-17 ENCOUNTER — Encounter (INDEPENDENT_AMBULATORY_CARE_PROVIDER_SITE_OTHER): Payer: Self-pay | Admitting: Internal Medicine

## 2009-10-17 ENCOUNTER — Ambulatory Visit (HOSPITAL_COMMUNITY): Admission: RE | Admit: 2009-10-17 | Discharge: 2009-10-17 | Payer: Self-pay | Admitting: Internal Medicine

## 2009-10-17 ENCOUNTER — Encounter: Payer: Self-pay | Admitting: Cardiovascular Disease

## 2009-10-17 ENCOUNTER — Ambulatory Visit: Payer: Self-pay

## 2009-10-17 ENCOUNTER — Ambulatory Visit: Payer: Self-pay | Admitting: Internal Medicine

## 2009-10-17 DIAGNOSIS — I6529 Occlusion and stenosis of unspecified carotid artery: Secondary | ICD-10-CM

## 2009-10-30 ENCOUNTER — Ambulatory Visit: Payer: Self-pay | Admitting: Cardiology

## 2009-11-14 ENCOUNTER — Ambulatory Visit: Payer: Self-pay | Admitting: Cardiology

## 2009-11-14 DIAGNOSIS — R279 Unspecified lack of coordination: Secondary | ICD-10-CM | POA: Insufficient documentation

## 2009-11-14 DIAGNOSIS — R5381 Other malaise: Secondary | ICD-10-CM

## 2009-11-14 DIAGNOSIS — R5383 Other fatigue: Secondary | ICD-10-CM | POA: Insufficient documentation

## 2009-11-21 LAB — CONVERTED CEMR LAB
BUN: 45 mg/dL — ABNORMAL HIGH (ref 6–23)
Basophils Absolute: 0 10*3/uL (ref 0.0–0.1)
Chloride: 111 meq/L (ref 96–112)
Creatinine, Ser: 1.4 mg/dL — ABNORMAL HIGH (ref 0.4–1.2)
Eosinophils Absolute: 0.2 10*3/uL (ref 0.0–0.7)
Glucose, Bld: 99 mg/dL (ref 70–99)
HCT: 31.6 % — ABNORMAL LOW (ref 36.0–46.0)
Lymphs Abs: 1.6 10*3/uL (ref 0.7–4.0)
MCV: 95.1 fL (ref 78.0–100.0)
Monocytes Absolute: 0.5 10*3/uL (ref 0.1–1.0)
Neutrophils Relative %: 61.5 % (ref 43.0–77.0)
Platelets: 146 10*3/uL — ABNORMAL LOW (ref 150.0–400.0)
Potassium: 4.4 meq/L (ref 3.5–5.1)
RDW: 13.9 % (ref 11.5–14.6)
TSH: 1.64 microintl units/mL (ref 0.35–5.50)
WBC: 6 10*3/uL (ref 4.5–10.5)

## 2009-11-27 ENCOUNTER — Ambulatory Visit: Payer: Self-pay | Admitting: Cardiology

## 2009-11-27 LAB — CONVERTED CEMR LAB: POC INR: 3.3

## 2009-12-19 ENCOUNTER — Ambulatory Visit: Payer: Self-pay | Admitting: Cardiovascular Disease

## 2010-01-16 ENCOUNTER — Ambulatory Visit: Payer: Self-pay | Admitting: Cardiology

## 2010-01-29 ENCOUNTER — Ambulatory Visit: Payer: Self-pay | Admitting: Cardiology

## 2010-02-05 ENCOUNTER — Ambulatory Visit: Payer: Self-pay | Admitting: Internal Medicine

## 2010-02-07 ENCOUNTER — Encounter: Admission: RE | Admit: 2010-02-07 | Discharge: 2010-02-07 | Payer: Self-pay | Admitting: Internal Medicine

## 2010-02-09 ENCOUNTER — Encounter (INDEPENDENT_AMBULATORY_CARE_PROVIDER_SITE_OTHER): Payer: Self-pay | Admitting: *Deleted

## 2010-02-26 ENCOUNTER — Ambulatory Visit: Payer: Self-pay | Admitting: Internal Medicine

## 2010-02-26 LAB — CONVERTED CEMR LAB: POC INR: 2.5

## 2010-03-19 ENCOUNTER — Ambulatory Visit: Payer: Self-pay | Admitting: Internal Medicine

## 2010-03-19 ENCOUNTER — Encounter: Payer: Self-pay | Admitting: Cardiology

## 2010-03-19 ENCOUNTER — Ambulatory Visit: Payer: Self-pay | Admitting: Cardiology

## 2010-03-20 LAB — CONVERTED CEMR LAB
BUN: 37 mg/dL — ABNORMAL HIGH (ref 6–23)
Basophils Absolute: 0 10*3/uL (ref 0.0–0.1)
CO2: 29 meq/L (ref 19–32)
Chloride: 101 meq/L (ref 96–112)
GFR calc non Af Amer: 37.73 mL/min — ABNORMAL LOW (ref >60.00)
Glucose, Bld: 135 mg/dL — ABNORMAL HIGH (ref 70–99)
HCT: 31.6 % — ABNORMAL LOW (ref 36.0–46.0)
Hemoglobin: 10.8 g/dL — ABNORMAL LOW (ref 12.0–15.0)
Lymphs Abs: 1.7 10*3/uL (ref 0.7–4.0)
MCHC: 34.2 g/dL (ref 30.0–36.0)
MCV: 94.9 fL (ref 78.0–100.0)
Monocytes Absolute: 0.5 10*3/uL (ref 0.1–1.0)
Monocytes Relative: 8.3 % (ref 3.0–12.0)
Neutro Abs: 3.6 10*3/uL (ref 1.4–7.7)
Potassium: 4.7 meq/L (ref 3.5–5.1)
RDW: 13.8 % (ref 11.5–14.6)
Sodium: 139 meq/L (ref 135–145)

## 2010-04-17 ENCOUNTER — Ambulatory Visit: Admission: RE | Admit: 2010-04-17 | Discharge: 2010-04-17 | Payer: Self-pay | Source: Home / Self Care

## 2010-05-06 ENCOUNTER — Encounter: Payer: Self-pay | Admitting: Internal Medicine

## 2010-05-07 ENCOUNTER — Ambulatory Visit: Admission: RE | Admit: 2010-05-07 | Discharge: 2010-05-07 | Payer: Self-pay | Source: Home / Self Care

## 2010-05-07 LAB — CONVERTED CEMR LAB: POC INR: 2.6

## 2010-05-15 NOTE — Medication Information (Signed)
Summary: rov/jb  Anticoagulant Therapy  Managed by: Bethena Midget, RN, BSN Referring MD: Charlies Constable MD PCP: DR Lenon Ahmadi MD: Tenny Craw MD, Gunnar Fusi Indication 1: Atrial Fibrillation (ICD-427.31) Lab Used: [facility] Site: [facility] INR POC 2.7 INR RANGE 2 - 3  Dietary changes: no    Health status changes: no    Bleeding/hemorrhagic complications: no    Recent/future hospitalizations: no    Any changes in medication regimen? no    Recent/future dental: no  Any missed doses?: no       Is patient compliant with meds? yes      Comments: Seeing Dr Juanda Chance today.   Allergies: No Known Drug Allergies  Anticoagulation Management History:      The patient is taking warfarin and comes in today for a routine follow up visit.  Positive risk factors for bleeding include an age of 75 years or older and presence of serious comorbidities.  The bleeding index is 'intermediate risk'.  Positive CHADS2 values include History of CHF, Age > 56 years old, and History of Diabetes.  The start date was 12/18/2001.  Her last INR was 2.6 RATIO.  Anticoagulation responsible provider: Tenny Craw MD, Gunnar Fusi.  INR POC: 2.7.  Cuvette Lot#: [id number].  Exp: 03/2011.    Anticoagulation Management Assessment/Plan:      The patient's current anticoagulation dose is Coumadin 5 mg tabs: Take as directed by coumadin clinic..  The target INR is 2 - 3.  The next INR is due 04/17/2010.  Anticoagulation instructions were given to patient.  Results were reviewed/authorized by Bethena Midget, RN, BSN.  She was notified by Bethena Midget, RN, BSN.         Prior Anticoagulation Instructions: The patient is to continue with the same dose of coumadin.  This dosage includes:  5mg  daily.  We will see you again before your other appointment on 12/5 at 1100 am   Current Anticoagulation Instructions: INR 2.7 Continue 5mg s daily. REcheck in 4 weeks.

## 2010-05-15 NOTE — Medication Information (Signed)
Summary: rov/tm  Anticoagulant Therapy  Managed by: Bethena Midget, RN, BSN Referring MD: Charlies Constable MD PCP: DR Lenon Ahmadi MD: Excell Seltzer MD, Casimiro Needle Indication 1: Atrial Fibrillation (ICD-427.31) Lab Used: [facility] Site: [facility] INR POC 2.5 INR RANGE 2 - 3  Dietary changes: no    Health status changes: no    Bleeding/hemorrhagic complications: no    Recent/future hospitalizations: no    Any changes in medication regimen? no    Recent/future dental: no  Any missed doses?: no       Is patient compliant with meds? yes       Allergies: No Known Drug Allergies  Anticoagulation Management History:      The patient is taking warfarin and comes in today for a routine follow up visit.  Positive risk factors for bleeding include an age of 75 years or older and presence of serious comorbidities.  The bleeding index is 'intermediate risk'.  Positive CHADS2 values include History of CHF, Age > 56 years old, and History of Diabetes.  The start date was 12/18/2001.  Her last INR was 2.6 RATIO.  Anticoagulation responsible provider: Excell Seltzer MD, Casimiro Needle.  INR POC: 2.5.  Cuvette Lot#: [id number].  Exp: 09/2010.    Anticoagulation Management Assessment/Plan:      The patient's current anticoagulation dose is Coumadin 5 mg tabs: Take as directed by coumadin clinic..  The target INR is 2 - 3.  The next INR is due 09/04/2009.  Anticoagulation instructions were given to patient.  Results were reviewed/authorized by Bethena Midget, RN, BSN.  She was notified by Bethena Midget, RN, BSN.         Prior Anticoagulation Instructions: INR 3.2 Skip today's dose then resume 5mg s everyday. Recheck in 3 weeks.   Current Anticoagulation Instructions: INR 2.5 Continue 5mg s daily. Recheck in 4 weeks.

## 2010-05-15 NOTE — Miscellaneous (Signed)
Summary: Orders Update  Clinical Lists Changes  Problems: Added new problem of CAROTID ARTERY DISEASE (ICD-433.10) Orders: Added new Test order of Carotid Duplex (Carotid Duplex) - Signed 

## 2010-05-15 NOTE — Medication Information (Signed)
Summary: rov/eac  Anticoagulant Therapy  Managed by: Shelby Dubin, PharmD, BCPS, CPP Referring MD: Charlies Constable MD PCP: DR Lenon Ahmadi MD: Riley Kill MD, Maisie Fus Indication 1: Atrial Fibrillation (ICD-427.31) Lab Used: [facility] Site: [facility] INR POC 2.3 INR RANGE 2 - 3  Dietary changes: no    Health status changes: no    Bleeding/hemorrhagic complications: yes       Details: continues to bruise on regular basis  Recent/future hospitalizations: no    Any changes in medication regimen? no    Recent/future dental: no  Any missed doses?: no       Is patient compliant with meds? yes       Current Medications (verified): 1)  Altace 10 Mg Caps (Ramipril) .... Take 2 Tablets Once Daily 2)  Coreg 25 Mg Tabs (Carvedilol) .Marland Kitchen.. 1 Tablet Twice Daily 3)  Coumadin 5 Mg Tabs (Warfarin Sodium) .... Take As Directed By Coumadin Clinic. 4)  Furosemide 40 Mg Tabs (Furosemide) .... Take 1/2 Tablet Daily 5)  Zocor 40 Mg Tabs (Simvastatin) .... Take 1 Tablet By Mouth At Bedtime 6)  Aspirin 81 Mg Tbec (Aspirin) .... Take One Tablet By Mouth Daily 7)  Onglyza 5 Mg Tabs (Saxagliptin Hcl) .... Take 1/2 Tab Once Daily 8)  Glimepiride 2 Mg Tabs (Glimepiride) .... 1/2  By Mouth Daily  Allergies (verified): No Known Drug Allergies  Anticoagulation Management History:      The patient is taking warfarin and comes in today for a routine follow up visit.  Positive risk factors for bleeding include an age of 74 years or older and presence of serious comorbidities.  The bleeding index is 'intermediate risk'.  Positive CHADS2 values include History of CHF, Age > 54 years old, and History of Diabetes.  The start date was 12/18/2001.  Her last INR was 2.6 RATIO.  Anticoagulation responsible provider: Riley Kill MD, Maisie Fus.  INR POC: 2.3.  Cuvette Lot#: [id number].  Exp: 08/2010.    Anticoagulation Management Assessment/Plan:      The patient's current anticoagulation dose is Coumadin 5 mg tabs: Take as  directed by coumadin clinic..  The target INR is 2 - 3.  The next INR is due 07/14/2009.  Anticoagulation instructions were given to patient.  Results were reviewed/authorized by Shelby Dubin, PharmD, BCPS, CPP.  She was notified by Shelby Dubin PharmD, BCPS, CPP.         Prior Anticoagulation Instructions: INR 3.4  Do not take coumadin today.  Then return to normal dosing schedule of 1 tablet every day. Return to clinic in 4 weeks.  Current Anticoagulation Instructions: INR 2.3  Continue 1 tab daily.  Recheck in 4 weeks.

## 2010-05-15 NOTE — Cardiovascular Report (Signed)
Summary: Office Visit  Office Visit   Imported By: Marylou Mccoy 12/08/2009 10:25:51  _____________________________________________________________________  External Attachment:    Type:   Image     Comment:   External Document

## 2010-05-15 NOTE — Medication Information (Signed)
Summary: Judith Zuniga  Anticoagulant Therapy  Managed by: Bethena Midget, RN, BSN Referring MD: Charlies Constable MD PCP: DR Lenon Ahmadi MD: Johney Frame MD, Fayrene Fearing Indication 1: Atrial Fibrillation (ICD-427.31) Lab Used: [facility] Site: [facility] INR POC 2.7 INR RANGE 2 - 3  Dietary changes: no    Health status changes: no    Bleeding/hemorrhagic complications: no    Recent/future hospitalizations: no    Any changes in medication regimen? yes       Details: Avelox completed last Wednesday  Recent/future dental: no  Any missed doses?: no       Is patient compliant with meds? yes       Allergies: No Known Drug Allergies  Anticoagulation Management History:      The patient is taking warfarin and comes in today for a routine follow up visit.  Positive risk factors for bleeding include an age of 75 years or older and presence of serious comorbidities.  The bleeding index is 'intermediate risk'.  Positive CHADS2 values include History of CHF, Age > 68 years old, and History of Diabetes.  The start date was 12/18/2001.  Her last INR was 2.6 RATIO.  Anticoagulation responsible provider: Allred MD, Fayrene Fearing.  INR POC: 2.7.  Cuvette Lot#: [id number].  Exp: 03/2011.    Anticoagulation Management Assessment/Plan:      The patient's current anticoagulation dose is Coumadin 5 mg tabs: Take as directed by coumadin clinic..  The target INR is 2 - 3.  The next INR is due 02/26/2010.  Anticoagulation instructions were given to patient.  Results were reviewed/authorized by Bethena Midget, RN, BSN.  She was notified by Bethena Midget, RN, BSN.         Prior Anticoagulation Instructions: INR 4.1  Skip today and tomorrow's dose. Then continue taking 1 tablet everyday. Recheck in 1 week.   Current Anticoagulation Instructions: INR 2.7 Continue 5mg s everyday.  Recheck in 3 weeks.

## 2010-05-15 NOTE — Medication Information (Signed)
Summary: rov/sl  Anticoagulant Therapy  Managed by: Bethena Midget, RN, BSN Referring MD: Charlies Constable MD PCP: DR Lenon Ahmadi MD: Eden Emms MD, Theron Arista Indication 1: Atrial Fibrillation (ICD-427.31) Lab Used: [facility] Site: [facility] INR POC 2.7 INR RANGE 2 - 3  Dietary changes: no    Health status changes: no    Bleeding/hemorrhagic complications: no    Recent/future hospitalizations: no    Any changes in medication regimen? no    Recent/future dental: no  Any missed doses?: yes     Details: missed last night dose  Is patient compliant with meds? yes       Allergies: No Known Drug Allergies  Anticoagulation Management History:      The patient is taking warfarin and comes in today for a routine follow up visit.  Positive risk factors for bleeding include an age of 40 years or older and presence of serious comorbidities.  The bleeding index is 'intermediate risk'.  Positive CHADS2 values include History of CHF, Age > 41 years old, and History of Diabetes.  The start date was 12/18/2001.  Her last INR was 2.6 RATIO.  Anticoagulation responsible provider: Eden Emms MD, Theron Arista.  INR POC: 2.7.  Cuvette Lot#: [id number].  Exp: 01/2011.    Anticoagulation Management Assessment/Plan:      The patient's current anticoagulation dose is Coumadin 5 mg tabs: Take as directed by coumadin clinic..  The target INR is 2 - 3.  The next INR is due 01/16/2010.  Anticoagulation instructions were given to patient.  Results were reviewed/authorized by Bethena Midget, RN, BSN.  She was notified by Bethena Midget, RN, BSN.         Prior Anticoagulation Instructions: INR 3.3  Do not Coumadin today, Monday, August 15th. Then, continue taking Coumadin 1 tab (5 mg) every day.  Return to clinic in 3 weeks.   Current Anticoagulation Instructions: INR 2.7 Continue 5mg s everyday. Recheck in 4 weeks.

## 2010-05-15 NOTE — Medication Information (Signed)
Summary: starting Avelox x 7 days on 01/25/10/ewj  Anticoagulant Therapy  Managed by: Weston Brass, PharmD Referring MD: Charlies Constable MD PCP: DR Lenon Ahmadi MD: Daleen Squibb MD, Maisie Fus Indication 1: Atrial Fibrillation (ICD-427.31) Lab Used: [facility] Site: [facility] INR POC 4.1 INR RANGE 2 - 3  Dietary changes: no    Health status changes: no    Bleeding/hemorrhagic complications: no    Recent/future hospitalizations: no    Any changes in medication regimen? yes       Details: avelox  Recent/future dental: no  Any missed doses?: no       Is patient compliant with meds? yes       Allergies: No Known Drug Allergies  Anticoagulation Management History:      The patient is taking warfarin and comes in today for a routine follow up visit.  Positive risk factors for bleeding include an age of 75 years or older and presence of serious comorbidities.  The bleeding index is 'intermediate risk'.  Positive CHADS2 values include History of CHF, Age > 68 years old, and History of Diabetes.  The start date was 12/18/2001.  Her last INR was 2.6 RATIO.  Anticoagulation responsible Junaid Wurzer: Daleen Squibb MD, Maisie Fus.  INR POC: 4.1.  Cuvette Lot#: [id number].  Exp: 02/2011.    Anticoagulation Management Assessment/Plan:      The patient's current anticoagulation dose is Coumadin 5 mg tabs: Take as directed by coumadin clinic..  The target INR is 2 - 3.  The next INR is due 02/05/2010.  Anticoagulation instructions were given to patient.  Results were reviewed/authorized by Weston Brass, PharmD.  She was notified by Ilean Skill D candidate.         Prior Anticoagulation Instructions: INR 2.7  Continue taking Coumadin 1 tab (5 mg) every day. Return to clinic in 4 weeks.   Current Anticoagulation Instructions: INR 4.1  Skip today and tomorrow's dose. Then continue taking 1 tablet everyday. Recheck in 1 week.

## 2010-05-15 NOTE — Medication Information (Signed)
Summary: rov/sp  Anticoagulant Therapy  Managed by: Cloyde Reams, RN, BSN Referring MD: Charlies Constable MD PCP: DR Lenon Ahmadi MD: Shirlee Latch MD, Shanyia Stines Indication 1: Atrial Fibrillation (ICD-427.31) Lab Used: [facility] Site: [address] INR POC 2.1 INR RANGE 2 - 3  Dietary changes: no    Health status changes: no    Bleeding/hemorrhagic complications: yes       Details: Bruising on arms.    Recent/future hospitalizations: no    Any changes in medication regimen? yes       Details: Started on new BP med x 2 weeks, unsure of name.  Recent/future dental: no  Any missed doses?: yes     Details: Missed 1 dosage, but took 1.5 tablets next day.    Is patient compliant with meds? yes       Allergies: No Known Drug Allergies  Anticoagulation Management History:      The patient is taking warfarin and comes in today for a routine follow up visit.  Positive risk factors for bleeding include an age of 75 years or older and presence of serious comorbidities.  The bleeding index is 'intermediate risk'.  Positive CHADS2 values include History of CHF, Age > 75 years old, and History of Diabetes.  The start date was 12/18/2001.  Her last INR was 2.6 RATIO.  Anticoagulation responsible provider: Shirlee Latch MD, Derrius Furtick.  INR POC: 2.1.  Cuvette Lot#: [id number].  Exp: 12/2010.    Anticoagulation Management Assessment/Plan:      The patient's current anticoagulation dose is Coumadin 5 mg tabs: Take as directed by coumadin clinic..  The target INR is 2 - 3.  The next INR is due 10/30/2009.  Anticoagulation instructions were given to patient.  Results were reviewed/authorized by Cloyde Reams, RN, BSN.  She was notified by Cloyde Reams RN.         Prior Anticoagulation Instructions: INR 2.4  Continue same dose of 1 tablet every day.    Current Anticoagulation Instructions: INR 2.1  Continue on same dosage 5mg  daily.  Recheck in 4 weeks.

## 2010-05-15 NOTE — Medication Information (Signed)
Summary: rov/tm  Anticoagulant Therapy  Managed by: Weston Brass, PharmD Referring MD: Charlies Constable MD PCP: DR Lenon Ahmadi MD: Jens Som MD, Arlys John Indication 1: Atrial Fibrillation (ICD-427.31) Lab Used: [facility] Site: [facility] INR POC 2.4 INR RANGE 2 - 3  Dietary changes: no    Health status changes: no    Bleeding/hemorrhagic complications: no    Recent/future hospitalizations: no    Any changes in medication regimen? no    Recent/future dental: no  Any missed doses?: no       Is patient compliant with meds? yes       Allergies: No Known Drug Allergies  Anticoagulation Management History:      The patient is taking warfarin and comes in today for a routine follow up visit.  Positive risk factors for bleeding include an age of 38 years or older and presence of serious comorbidities.  The bleeding index is 'intermediate risk'.  Positive CHADS2 values include History of CHF, Age > 23 years old, and History of Diabetes.  The start date was 12/18/2001.  Her last INR was 2.6 RATIO.  Anticoagulation responsible provider: Jens Som MD, Arlys John.  INR POC: 2.4.  Cuvette Lot#: [id number].  Exp: 11/2010.    Anticoagulation Management Assessment/Plan:      The patient's current anticoagulation dose is Coumadin 5 mg tabs: Take as directed by coumadin clinic..  The target INR is 2 - 3.  The next INR is due 10/02/2009.  Anticoagulation instructions were given to patient.  Results were reviewed/authorized by Weston Brass, PharmD.  She was notified by Weston Brass PharmD.         Prior Anticoagulation Instructions: INR 2.5 Continue 5mg s daily. Recheck in 4 weeks.   Current Anticoagulation Instructions: INR 2.4  Continue same dose of 1 tablet every day.

## 2010-05-15 NOTE — Assessment & Plan Note (Signed)
Summary: per check out   Visit Type:  Follow-up Primary Provider:  DR Selena Batten  CC:  ROV; No Complaints.  History of Present Illness: The patient is 77 years old and return for management of CAD, CHF, atrial fibrillation, and her pacemaker. She had redo bypass surgery in 1998. She had LV dysfunction with an ejection fraction of 25% but this normalized after biventriclar  pacing. She is status post a biventricular pacemaker. and is status post AV nodal ablation. Her pacemaker is programmed to VVI she has a history of systolic and diastolic CHF.  We did an echocardiogram on her recently which showed good LV function with an ejection fraction of 55% but she had severe mitral regurgitation.  Dr. Selena Batten has switched her from Altace to losartan because of cough.  She says she has been well since her last visit with no chest pain shortness of breath or palpitations. 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  I discussed with her followup with Dr. Shirlee Latch for her general cardiology problems and followup in one year with Dr. Graciela Husbands for her biventricular pacemaker.  Current Medications (verified): 1)  Coreg 25 Mg Tabs (Carvedilol) .... Take One Tablet Two Times A Day 2)  Coumadin 5 Mg Tabs (Warfarin Sodium) .... Take As Directed By Coumadin Clinic. 3)  Furosemide 40 Mg Tabs (Furosemide) .... Take 1/2 Tablet Daily As Directed 4)  Zocor 40 Mg Tabs (Simvastatin) .... Take 1 Tablet By Mouth At Bedtime 5)  Aspirin 81 Mg Tbec (Aspirin) .... Take One Tablet By Mouth Daily 6)  Glimepiride 2 Mg Tabs (Glimepiride) .... 1/2  By Mouth Daily 7)  Losartan Potassium 100 Mg Tabs (Losartan Potassium) .... Take 1/2 Tab By Mouth Once Daily  Allergies (verified): No Known Drug Allergies  Past History:  Past Surgical History: Last updated: 05/07/08 1998: CABG x 5:  RIMA to diagonal one, RIMA to diagonal 2, SVG to PD SVG to PL SVG to Circ 1985 CABG x 3: LIMA to LAD, SVG to PD and SVG to PL multiple cardiac  caths (most recently 2006) pacer generator change out  Family History: Last updated: 05/07/2008 Mother died at 68 of MI Father died at 52 in sleep (pt was told it was due to MI, but no autopsy was completed) No siblings with cardiovascular disease  Social History: Last updated: 05/07/08 Lives in [place], 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  No alcohol, tobacco, or drug abuse history.    Past Medical History: Reviewed history from 11/13/2008 and no changes required. Ablation-AV Node (S/P) Pacemaker-BiV (S/P)  AODM (ICD-250.00) PACEMAKER (ICD-V45.Marland Kitchen01) 1. Coronary artery disease status post redo bypass surgery in 1998     with patent graft in January 2009. 2. 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 3. Systolic heart failure, class II and euvolemic. 4. Status post biventricular pacer and status post atrioventricular     nodal ablation with no implantable cardioverter-defibrillator. 5. Chronic atrial fibrillation. 6. Hypertension. 7. Hyperlipidemia. 8. Diabetes. 9. Frequent premature ventricular contractions. 10.Elevated liver function tests on amiodarone.   Review of Systems       ROS is negative except as outlined in HPI.   Vital Signs:  Patient profile:   77 year old female Height:      63 inches Weight:      127 pounds BMI:     22.58 Pulse rate:   70 / minute BP sitting:   156 / 58  (left arm)  Vitals  Entered By: Stanton Kidney, EMT-P (March 19, 2010 11:17 AM)  Physical Exam  Additional Exam:  Gen. Well-nourished, in no distress   Neck: No JVD, thyroid not enlarged, no carotid bruits Lungs: No tachypnea, clear without rales, rhonchi or wheezes Cardiovascular: Rhythm regular, PMI not displaced,  heart sounds  normal, grade 2-3/6 systolic murmur at the apex, no peripheral edema, pulses normal in all 4 extremities. Abdomen: BS normal, abdomen soft and non-tender without masses or organomegaly, no  hepatosplenomegaly. MS: No deformities, no cyanosis or clubbing   Neuro:  No focal sns   Skin:  no lesions    PPM Specifications Following MD:  Everardo Beals. Juanda Chance, MD     PPM Vendor:  [employer]     PPM Model Number:  [id number]     PPM Serial Number:  [id number] DOI:  05/04/2007      Lead 1    Location: RA     DOI: 03/15/2002     Model #: 1688TC     Serial #: [id number]     Status: active Lead 2    Location: RV     DOI: 03/15/2002     Model #: 1688TC     Serial #: [id number]     Status: active Lead 3    Location: LV     DOI: 05/04/2007     Model #: 1158T     Serial #: [id number]     Status: active  Magnet Response Rate:  BOL 98.6 ERI 86.3  Indications:  ICM;A-fib   PPM Follow Up Remote Check?  No Battery Voltage:  2.78 V     Battery Est. Longevity:  8.50 years     Pacer Dependent:  Yes     Right Ventricle  Impedance: 503 ohms, Threshold: 1.0 V at 0.4 msec Left Ventricle  Impedance: 656 ohms, Threshold: 1.0 V at 0.6 msec  Episodes Coumadin:  Yes  Parameters Mode:  VVTR     Lower Rate Limit:  70     Upper Rate Limit:  105 Next Cardiology Appt Due:  09/14/2010 Tech Comments:  No parameter changes.  Device function normal.  Rate response blunted but adequate for the patient's level of activity. TTM's with [employer]. ROV 6 months with Dr. Johney Frame. Altha Harm, LPN  March 19, 2010 11:43 AM   Impression & Recommendations:  Problem # 1:  CAD, ARTERY BYPASS GRAFT (ICD-414.04)  She has had redo bypass surgery and had patent grafts at last catheterization. She's had no chest pain. This polyp is stable. Her updated medication list for this problem includes:    Coreg 25 Mg Tabs (Carvedilol) .Marland Kitchen... Take one tablet two times a day    Coumadin 5 Mg Tabs (Warfarin sodium) .Marland Kitchen... Take as directed by coumadin clinic.    Aspirin 81 Mg Tbec (Aspirin) .Marland Kitchen... Take one tablet by mouth daily  Orders: EKG w/ Interpretation (93000) TLB-BMP (Basic Metabolic Panel-BMET) (80048-METABOL) TLB-CBC Platelet - w/Differential  (85025-CBCD)  Problem # 2:  SYSTOLIC HEART FAILURE, CHRONIC (ICD-428.22)  She has systolic heart failure. Her left ventricular function has normalized on last echo with an ejection fraction of 55% in July of 2011. She does have severe mitral regurgitation by echo. She appears euvolemic today in his palm appears stable. Her updated medication list for this problem includes:    Coreg 25 Mg Tabs (Carvedilol) .Marland Kitchen... Take one tablet two times a day    Coumadin 5 Mg Tabs (Warfarin sodium) .Marland Kitchen... Take as directed by coumadin  clinic.    Furosemide 40 Mg Tabs (Furosemide) .Marland Kitchen... Take 1/2 tablet daily as directed    Aspirin 81 Mg Tbec (Aspirin) .Marland Kitchen... Take one tablet by mouth daily    Losartan Potassium 100 Mg Tabs (Losartan potassium) .Marland Kitchen... Take 1/2 tab by mouth once daily  Orders: EKG w/ Interpretation (93000) TLB-BMP (Basic Metabolic Panel-BMET) (80048-METABOL) TLB-CBC Platelet - w/Differential (85025-CBCD)  Problem # 3:  PACEMAKER, PERMANENT (ICD-V45.01) She has a biventricular pacemaker without an ICD. We interrogated her pacemaker today and she is pacing the ventricle essentially all the time. She had good thresholds on both the right and left ventricle.  Problem # 4:  ATRIAL FIBRILLATION (ICD-427.31) She has chronic atrial fibrillation and is status She is on chronic Coumadin therapy. post AV nodal ablation. Her updated medication list for this problem includes:    Coreg 25 Mg Tabs (Carvedilol) .Marland Kitchen... Take one tablet two times a day    Coumadin 5 Mg Tabs (Warfarin sodium) .Marland Kitchen... Take as directed by coumadin clinic.    Aspirin 81 Mg Tbec (Aspirin) .Marland Kitchen... Take one tablet by mouth daily  Patient Instructions: 1)  Labwork today: bmet/cbc (414.01;427.31;428.22) 2)  Your physician wants you to follow-up in: 1) 6 months with Dr. Shirlee Latch. & 2) 1year with Dr. Graciela Husbands.  You will receive a reminder letter in the mail two months in advance. If you don't receive a letter, please call our office to schedule the  follow-up appointment. 3)  Your physician recommends that you continue on your current medications as directed. Please refer to the Current Medication list given to you today.

## 2010-05-15 NOTE — Medication Information (Signed)
Summary: [facility] Zuniga  Anticoagulant Therapy  Managed by: Bethena Midget, RN, BSN Referring MD: Charlies Constable MD PCP: DR Lenon Ahmadi MD: Tenny Craw MD, Gunnar Fusi Indication 1: Atrial Fibrillation (ICD-427.31) Lab Used: [facility] Site: [facility] INR POC 3.2 INR RANGE 2 - 3  Dietary changes: no    Health status changes: no    Bleeding/hemorrhagic complications: no    Recent/future hospitalizations: no    Any changes in medication regimen? no    Recent/future dental: no  Any missed doses?: no       Is patient compliant with meds? yes      Comments: Didn't eat as many green leafy vegetables this week.  Allergies: No Known Drug Allergies  Anticoagulation Management History:      The patient is taking warfarin and comes in today for a routine follow up visit.  Positive risk factors for bleeding include an age of 75 years or older and presence of serious comorbidities.  The bleeding index is 'intermediate risk'.  Positive CHADS2 values include History of CHF, Age > 39 years old, and History of Diabetes.  The start date was 12/18/2001.  Her last INR was 2.6 RATIO.  Anticoagulation responsible provider: Tenny Craw MD, Gunnar Fusi.  INR POC: 3.2.  Cuvette Lot#: [id number].  Exp: 08/2010.    Anticoagulation Management Assessment/Plan:      The patient's current anticoagulation dose is Coumadin 5 mg tabs: Take as directed by coumadin clinic..  The target INR is 2 - 3.  The next INR is due 08/07/2009.  Anticoagulation instructions were given to patient.  Results were reviewed/authorized by Bethena Midget, RN, BSN.  She was notified by Bethena Midget, RN, BSN.         Prior Anticoagulation Instructions: INR 2.3  Continue 1 tab daily.  Recheck in 4 weeks.    Current Anticoagulation Instructions: INR 3.2 Skip today's dose then resume 5mg s everyday. Recheck in 3 weeks.

## 2010-05-15 NOTE — Assessment & Plan Note (Signed)
Summary: 6 month rov   Primary Provider:  DR Selena Batten   History of Present Illness: The patient is 77 years old and returns for management of CAD and CHF and her pacemaker. She had redo bypass surgery in 1998. Her last catheterization was in 2009 at which time she had patent grafts. She has a history of systolic CHF and she has a biventricular pacemaker without an ICD. She also has a history of atrial fibrillation status post AV nodal ablation. ut this imously had an ejection fraction of 25% but this improved to normal by echocardiography in February 2010 after her biventricular pacemaker.  She says she's been doing quite well has had no chest pain shortness of breath palpitations or swelling.  Current Medications (verified): 1)  Altace 10 Mg Caps (Ramipril) .... Take 2 Tablets Once Daily 2)  Coreg 25 Mg Tabs (Carvedilol) .Marland Kitchen.. 1 Tablet Twice Daily 3)  Coumadin 5 Mg Tabs (Warfarin Sodium) .... Take As Directed By Coumadin Clinic. 4)  Furosemide 40 Mg Tabs (Furosemide) .... Take 1/2 Tablet Daily 5)  Zocor 40 Mg Tabs (Simvastatin) .... Take 1 Tablet By Mouth At Bedtime 6)  Aspirin 81 Mg Tbec (Aspirin) .... Take One Tablet By Mouth Daily 7)  Onglyza 5 Mg Tabs (Saxagliptin Hcl) .... Take 1/2 Tab Once Daily 8)  Glimepiride 2 Mg Tabs (Glimepiride) .... 1/2  By Mouth Daily  Allergies (verified): No Known Drug Allergies  Past History:  Past Medical History: Reviewed history from 11/13/2008 and no changes required. Ablation-AV Node (S/P) Pacemaker-BiV (S/P)  AODM (ICD-250.00) PACEMAKER (ICD-V45.Marland Kitchen01) 1. Coronary artery disease status post redo bypass surgery in 1998     with patent graft in January 2009. 2. 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 3. Systolic heart failure, class II and euvolemic. 4. Status post biventricular pacer and status post atrioventricular     nodal ablation with no implantable cardioverter-defibrillator. 5. Chronic atrial fibrillation. 6. Hypertension. 7.  Hyperlipidemia. 8. Diabetes. 9. Frequent premature ventricular contractions. 10.Elevated liver function tests on amiodarone.   Review of Systems       ROS is negative except as outlined in HPI.   Vital Signs:  Patient profile:   77 year old female Height:      64 inches Weight:      129 pounds Pulse rate:   70 / minute Resp:     16 per minute BP sitting:   130 / 67  (right arm)  Vitals Entered By: Marrion Coy, CNA (May 22, 2009 8:56 AM)  Physical Exam  Additional Exam:  Gen. Well-nourished, in no distress   Neck: No JVD, thyroid not enlarged, no carotid bruits Lungs: No tachypnea, clear without rales, rhonchi or wheezes Cardiovascular: Rhythm regular, PMI not displaced,  heart sounds  normal, grade 2/6 pansystolic murmur at the apex, no peripheral edema, pulses normal in all 4 extremities. Abdomen: BS normal, abdomen soft and non-tender without masses or organomegaly, no hepatosplenomegaly. MS: No deformities, no cyanosis or clubbing   Neuro:  No focal sns   Skin:  no lesions    PPM Specifications Following MD:  Everardo Beals. Juanda Chance, MD     PPM Vendor:  [employer]     PPM Model Number:  [id number]     PPM Serial Number:  [id number] DOI:  05/04/2007      Lead 1    Location: RA     DOI: 03/15/2002     Model #: 1688TC     Serial #: [id number]  Status: active Lead 2    Location: RV     DOI: 03/15/2002     Model #: 1688TC     Serial #: [id number]     Status: active Lead 3    Location: LV     DOI: 05/04/2007     Model #: 1158T     Serial #: [id number]     Status: active  Magnet Response Rate:  BOL 98.6 ERI 86.3  Indications:  ICM;A-fib   PPM Follow Up Remote Check?  No Battery Voltage:  2.78 V     Battery Est. Longevity:  >10 YEARS     Pacer Dependent:  Yes     Right Ventricle  Amplitude: PACED AT 30 mV, Impedance: 488 ohms, Threshold: 1.25 V at 0.4 msec Left Ventricle  Impedance: 669 ohms, Threshold: 1.0 V at 0.6 msec  Episodes Ventricular Pacing:  99%  Parameters Mode:  VVTR      Lower Rate Limit:  70     Upper Rate Limit:  105 Next Cardiology Appt Due:  10/13/2009 Tech Comments:  Normal device function.  No changes made today.  Histagrams appropriate for patient.  TTM's with [facility].  ROV 6 months clinic. Gypsy Balsam RN BSN  May 22, 2009 9:22 AM   Impression & Recommendations:  Problem # 1:  SYSTOLIC HEART FAILURE, CHRONIC (ICD-428.22) She has a history of systolic CHF and left ventricular dysfunction but her LV function has normalized by last echo.ugh her LV function has normalized by last echo. She is euvolemic today and is well compensated. We will continue current therapy.  Her updated medication list for this problem includes:    Altace 10 Mg Caps (Ramipril) .Marland Kitchen... Take 2 tablets once daily    Coreg 25 Mg Tabs (Carvedilol) .Marland Kitchen... 1 tablet twice daily    Coumadin 5 Mg Tabs (Warfarin sodium) .Marland Kitchen... Take as directed by coumadin clinic.    Furosemide 40 Mg Tabs (Furosemide) .Marland Kitchen... Take 1/2 tablet daily    Aspirin 81 Mg Tbec (Aspirin) .Marland Kitchen... Take one tablet by mouth daily  Orders: EKG w/ Interpretation (93000) TLB-BMP (Basic Metabolic Panel-BMET) (80048-METABOL) TLB-CBC Platelet - w/Differential (85025-CBCD) TLB-TSH (Thyroid Stimulating Hormone) (84443-TSH)  Problem # 2:  CAD, ARTERY BYPASS GRAFT (ICD-414.04) She has had redo bypass surgery. She has had no recent chest pain in this problem appears stable.   Her updated medication list for this problem includes:    Altace 10 Mg Caps (Ramipril) .Marland Kitchen... Take 2 tablets once daily    Coreg 25 Mg Tabs (Carvedilol) .Marland Kitchen... 1 tablet twice daily    Coumadin 5 Mg Tabs (Warfarin sodium) .Marland Kitchen... Take as directed by coumadin clinic.    Aspirin 81 Mg Tbec (Aspirin) .Marland Kitchen... Take one tablet by mouth daily  Orders: EKG w/ Interpretation (93000) TLB-BMP (Basic Metabolic Panel-BMET) (80048-METABOL) TLB-CBC Platelet - w/Differential (85025-CBCD) TLB-Hepatic/Liver Function Pnl (80076-HEPATIC) TLB-Lipid Panel (80061-LIPID) TLB-TSH  (Thyroid Stimulating Hormone) (84443-TSH)  Problem # 3:  ATRIAL FIBRILLATION (ICD-427.31) She has chronic atrial fibrillation status post AV nodal ablation. This problem is stable and controlled with her biventricular pacemaker. Her updated medication list for this problem includes:    Coreg 25 Mg Tabs (Carvedilol) .Marland Kitchen... 1 tablet twice daily    Coumadin 5 Mg Tabs (Warfarin sodium) .Marland Kitchen... Take as directed by coumadin clinic.    Aspirin 81 Mg Tbec (Aspirin) .Marland Kitchen... Take one tablet by mouth daily  Orders: EKG w/ Interpretation (93000) TLB-BMP (Basic Metabolic Panel-BMET) (80048-METABOL) TLB-CBC Platelet - w/Differential (85025-CBCD) TLB-TSH (Thyroid Stimulating Hormone) (  84443-TSH)  Problem # 4:  PACEMAKER (ICD-V45.Marland Kitchen01) She has a biventricular pacemaker without an ICD.  She has atrial fibrillation status post AV nodal ablation.rial fibrillation status post AV nodal ablation. Her pacer function is good.  Patient Instructions: 1)  Your physician recommends that you have lab work today: lipid/liver/cbc/bmp/tsh (428.22;414.01;427.31) 2)  Your physician wants you to follow-up in: 6 months.  You will receive a reminder letter in the mail two months in advance. If you don't receive a letter, please call our office to schedule the follow-up appointment.

## 2010-05-15 NOTE — Progress Notes (Signed)
Summary: pt needs today  Phone Note Refill Request Call back at Home Phone [phone] Message from:  Patient  Refills Requested: Medication #1:  COREG 25 MG TABS 1 tablet twice daily Initial call taken by: Omer Jack,  May 30, 2009 12:19 PM    Prescriptions: COREG 25 MG TABS (CARVEDILOL) 1 tablet twice daily  #60 x 12   Entered by:   Kem Parkinson   Authorized by:   Lenoria Farrier, MD, [facility]   Signed by:   Kem Parkinson on 05/30/2009   Method used:   Electronically to        [facility] [phone]* (retail)       [address]       [place], [place]  [address]       Ph: [phone]       Fax: [phone]   RxID:   [id number]

## 2010-05-15 NOTE — Medication Information (Signed)
Summary: rov/tm  Anticoagulant Therapy  Managed by: Reina Fuse, PharmD Referring MD: Charlies Constable MD PCP: DR Lenon Ahmadi MD: Jens Som MD, Arlys John Indication 1: Atrial Fibrillation (ICD-427.31) Lab Used: [facility] Site: [facility] INR POC 2.7 INR RANGE 2 - 3  Dietary changes: no    Health status changes: no    Bleeding/hemorrhagic complications: no    Recent/future hospitalizations: no    Any changes in medication regimen? no    Recent/future dental: no  Any missed doses?: no       Is patient compliant with meds? yes       Current Medications (verified): 1)  Coreg 25 Mg Tabs (Carvedilol) .... Take One Tablet Two Times A Day 2)  Coumadin 5 Mg Tabs (Warfarin Sodium) .... Take As Directed By Coumadin Clinic. 3)  Furosemide 40 Mg Tabs (Furosemide) .... Take 1/2 Tablet Daily As Directed 4)  Zocor 40 Mg Tabs (Simvastatin) .... Take 1 Tablet By Mouth At Bedtime 5)  Aspirin 81 Mg Tbec (Aspirin) .... Take One Tablet By Mouth Daily 6)  Glimepiride 2 Mg Tabs (Glimepiride) .... 1/2  By Mouth Daily 7)  Losartan Potassium 100 Mg Tabs (Losartan Potassium) .... Take 1/2 Tab By Mouth Once Daily  Allergies (verified): No Known Drug Allergies  Anticoagulation Management History:      The patient is taking warfarin and comes in today for a routine follow up visit.  Positive risk factors for bleeding include an age of 75 years or older and presence of serious comorbidities.  The bleeding index is 'intermediate risk'.  Positive CHADS2 values include History of CHF, Age > 75 years old, and History of Diabetes.  The start date was 12/18/2001.  Her last INR was 2.6 RATIO.  Anticoagulation responsible Sharlotte Baka: Jens Som MD, Arlys John.  INR POC: 2.7.  Cuvette Lot#: [id number].  Exp: 01/2011.    Anticoagulation Management Assessment/Plan:      The patient's current anticoagulation dose is Coumadin 5 mg tabs: Take as directed by coumadin clinic..  The target INR is 2 - 3.  The next INR is due  02/14/2010.  Anticoagulation instructions were given to patient.  Results were reviewed/authorized by Reina Fuse, PharmD.  She was notified by Reina Fuse PharmD.         Prior Anticoagulation Instructions: INR 2.7 Continue 5mg s everyday. Recheck in 4 weeks.   Current Anticoagulation Instructions: INR 2.7  Continue taking Coumadin 1 tab (5 mg) every day. Return to clinic in 4 weeks.

## 2010-05-15 NOTE — Miscellaneous (Signed)
Summary: dx code correction  Clinical Lists Changes  Problems: Changed problem from PACEMAKER (ICD-V45.Marland Kitchen01) to PACEMAKER, PERMANENT (ICD-V45.01) changed the incorrect dx code to correct dx code Genella Mech  February 09, 2010 10:12 AM

## 2010-05-15 NOTE — Cardiovascular Report (Signed)
Summary: TTM   TTM   Imported By: Roderic Ovens 05/23/2009 16:01:29  _____________________________________________________________________  External Attachment:    Type:   Image     Comment:   External Document

## 2010-05-15 NOTE — Assessment & Plan Note (Signed)
Summary: f68m   Visit Type:  Follow-up Primary Berkley Wrightsman:  DR Selena Batten  CC:  has been feeling weak for the last week.  History of Present Illness: The patient is 77 years old and return for management of CAD, CHF, atrial fibrillation, and her pacemaker. She had redo bypass surgery in 1998. She had LV dysfunction with an ejection fraction of 25% but this normalized after by V. pacing. She is status post a biventricular pacemaker. tatus post AV nodal ablation.brillation and is status post AV nodal ablation. Her pacemaker is programmed to VVI she has a history of systolic and diastolic CHF.  She says over the last month she has not felt well. She has had a very unsteady gait. She says she's had no energy.  We did an echocardiogram on her recently which showed good LV function with an ejection fraction of 55% but she had severe mitral regurgitation.  Dr. Selena Batten recently switched her from Altace to losartan because of cough.  Current Medications (verified): 1)  Coreg 25 Mg Tabs (Carvedilol) .... Take One Tablet Two Times A Day 2)  Coumadin 5 Mg Tabs (Warfarin Sodium) .... Take As Directed By Coumadin Clinic. 3)  Furosemide 40 Mg Tabs (Furosemide) .... Take 1/2 Tablet Daily As Directed 4)  Zocor 40 Mg Tabs (Simvastatin) .... Take 1 Tablet By Mouth At Bedtime 5)  Aspirin 81 Mg Tbec (Aspirin) .... Take One Tablet By Mouth Daily 6)  Glimepiride 2 Mg Tabs (Glimepiride) .... 1/2  By Mouth Daily 7)  Losartan Potassium 100 Mg Tabs (Losartan Potassium) .... Take 1/2 Tab By Mouth Once Daily  Allergies (verified): No Known Drug Allergies  Past History:  Past Medical History: Reviewed history from 11/13/2008 and no changes required. Ablation-AV Node (S/P) Pacemaker-BiV (S/P)  AODM (ICD-250.00) PACEMAKER (ICD-V45.Marland Kitchen01) 1. Coronary artery disease status post redo bypass surgery in 1998     with patent graft in January 2009. 2. 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 3. Systolic heart failure,  class II and euvolemic. 4. Status post biventricular pacer and status post atrioventricular     nodal ablation with no implantable cardioverter-defibrillator. 5. Chronic atrial fibrillation. 6. Hypertension. 7. Hyperlipidemia. 8. Diabetes. 9. Frequent premature ventricular contractions. 10.Elevated liver function tests on amiodarone.   Review of Systems       ROS is negative except as outlined in HPI.   Vital Signs:  Patient profile:   77 year old female Height:      64 inches Weight:      127 pounds BMI:     21.88 Pulse rate:   82 / minute BP sitting:   141 / 72  (left arm)  Vitals Entered By: Burnett Kanaris, CNA (November 14, 2009 10:11 AM)  Physical Exam  Additional Exam:  Gen. Well-nourished, in no distress   Neck: No JVD, thyroid not enlarged, no carotid bruits Lungs: No tachypnea, clear without rales, rhonchi or wheezes Cardiovascular: Rhythm regular, PMI not displaced,  heart sounds  normal, 3/6 PSM at apex, no peripheral edema, pulses normal in all 4 extremities. Abdomen: BS normal, abdomen soft and non-tender without masses or organomegaly, no hepatosplenomegaly. MS: No deformities, no cyanosis or clubbing   Neuro:  No focal sns   Skin:  no lesions    PPM Specifications Following MD:  Everardo Beals. Juanda Chance, MD     PPM Vendor:  [employer]     PPM Model Number:  [id number]     PPM Serial Number:  [id number] PPM DOI:  05/04/2007      Lead 1    Location: RA     DOI: 03/15/2002     Model #: [id number]     Serial #: [id number]     Status: active Lead 2    Location: RV     DOI: 03/15/2002     Model #: 1688TC     Serial #: [id number]     Status: active Lead 3    Location: LV     DOI: 05/04/2007     Model #: 1158T     Serial #: [id number]     Status: active  Magnet Response Rate:  BOL 98.6 ERI 86.3  Indications:  ICM;A-fib   PPM Follow Up Remote Check?  No Battery Voltage:  2.78 V     Battery Est. Longevity:  9.25 YEARS     Pacer Dependent:  Yes     Right Ventricle  Impedance: 507 ohms, Threshold:  1.0 V at 0.4 msec Left Ventricle  Impedance: 657 ohms, Threshold: 1.0 V at 0.6 msec  Episodes Coumadin:  Yes Ventricular Pacing:  100%  Parameters Mode:  VVTR     Lower Rate Limit:  70     Upper Rate Limit:  105 Next Cardiology Appt Due:  05/16/2010 Tech Comments:  Ventricular sensitivity reprogrammed 4mV.  TTM's with [employer].  ROV 6 months clinic. Altha Harm, LPN  November 14, 2009 10:48 AM   Impression & Recommendations:  Problem # 1:  SYSTOLIC HEART FAILURE, CHRONIC (ICD-428.22) She appears euvolemic today. This appears stable. The following medications were removed from the medication list:    Altace 10 Mg Caps (Ramipril) .Marland Kitchen... Take 2 tablets once daily Her updated medication list for this problem includes:    Coreg 25 Mg Tabs (Carvedilol) .Marland Kitchen... Take one tablet two times a day    Coumadin 5 Mg Tabs (Warfarin sodium) .Marland Kitchen... Take as directed by coumadin clinic.    Furosemide 40 Mg Tabs (Furosemide) .Marland Kitchen... Take 1/2 tablet daily as directed    Aspirin 81 Mg Tbec (Aspirin) .Marland Kitchen... Take one tablet by mouth daily    Losartan Potassium 100 Mg Tabs (Losartan potassium) .Marland Kitchen... Take 1/2 tab by mouth once daily  Orders: TLB-BMP (Basic Metabolic Panel-BMET) (80048-METABOL) TLB-CBC Platelet - w/Differential (85025-CBCD) TLB-TSH (Thyroid Stimulating Hormone) (84443-TSH)  Problem # 2:  ATRIAL FIBRILLATION (ICD-427.31) She has chronic AF. She is s/p AVN RFA and has a pacer. Her updated medication list for this problem includes:    Coreg 25 Mg Tabs (Carvedilol) .Marland Kitchen... Take one tablet two times a day    Coumadin 5 Mg Tabs (Warfarin sodium) .Marland Kitchen... Take as directed by coumadin clinic.    Aspirin 81 Mg Tbec (Aspirin) .Marland Kitchen... Take one tablet by mouth daily  Orders: TLB-BMP (Basic Metabolic Panel-BMET) (80048-METABOL) TLB-CBC Platelet - w/Differential (85025-CBCD) TLB-TSH (Thyroid Stimulating Hormone) (84443-TSH)  Problem # 3:  PACEMAKER (ICD-V45.Marland Kitchen01) She has  a bi-V pacer programmed to VVIR.  This is  stable.  Problem # 4:  CAD, ARTERY BYPASS GRAFT (ICD-414.04) She has had re-do CABG.  No CP.  This is stable. The following medications were removed from the medication list:    Altace 10 Mg Caps (Ramipril) .Marland Kitchen... Take 2 tablets once daily Her updated medication list for this problem includes:    Coreg 25 Mg Tabs (Carvedilol) .Marland Kitchen... Take one tablet two times a day    Coumadin 5 Mg Tabs (Warfarin sodium) .Marland Kitchen... Take as directed by coumadin clinic.    Aspirin 81 Mg Tbec (Aspirin) .Marland KitchenMarland KitchenMarland KitchenMarland Kitchen  Take one tablet by mouth daily  Orders: TLB-BMP (Basic Metabolic Panel-BMET) (80048-METABOL) TLB-CBC Platelet - w/Differential (85025-CBCD) TLB-TSH (Thyroid Stimulating Hormone) (84443-TSH)  Problem # 5:  FATIGUE / MALAISE (ICD-780.79) She has fatigue.  May be related in part to severe MR.  Recent echo showed good LV fct but severe MR.  Problem # 6:  ATAXIA (ICD-781.3) Her other CC is balance problems and difficulty walking.  Defere to Dr Selena Batten re further eval.  Patient Instructions: 1)  Your physician recommends that you schedule a follow-up appointment in: 4 months. 2)  Your physician recommends that you continue on your current medications as directed. Please refer to the Current Medication list given to you today. 3)  Your physician recommends that you have lab work today: bmet/cbc/tsh (428.22;414.01;427.31).

## 2010-05-15 NOTE — Medication Information (Signed)
Summary: rov/ewj  Anticoagulant Therapy  Managed by: Weston Brass, PharmD Referring MD: Charlies Constable MD PCP: DR Lenon Ahmadi MD: Antoine Poche MD, Fayrene Fearing Indication 1: Atrial Fibrillation (ICD-427.31) Lab Used: [facility]  Site: [facility] INR POC 2.4 INR RANGE 2 - 3  Dietary changes: no    Health status changes: no    Bleeding/hemorrhagic complications: no    Recent/future hospitalizations: no    Any changes in medication regimen? no    Recent/future dental: no  Any missed doses?: no       Is patient compliant with meds? yes       Allergies: No Known Drug Allergies  Anticoagulation Management History:      The patient is taking warfarin and comes in today for a routine follow up visit.  Positive risk factors for bleeding include an age of 75 years or older and presence of serious comorbidities.  The bleeding index is 'intermediate risk'.  Positive CHADS2 values include History of CHF, Age > 25 years old, and History of Diabetes.  The start date was 12/18/2001.  Her last INR was 2.6 RATIO.  Anticoagulation responsible provider: Antoine Poche MD, Fayrene Fearing.  INR POC: 2.4.  Cuvette Lot#: [id number].  Exp: 01/2011.    Anticoagulation Management Assessment/Plan:      The patient's current anticoagulation dose is Coumadin 5 mg tabs: Take as directed by coumadin clinic..  The target INR is 2 - 3.  The next INR is due 11/27/2009.  Anticoagulation instructions were given to patient.  Results were reviewed/authorized by Weston Brass, PharmD.  She was notified by Dillard Cannon.         Prior Anticoagulation Instructions: INR 2.1  Continue on same dosage 5mg  daily.  Recheck in 4 weeks.    Current Anticoagulation Instructions: INR 2.4  Continue same dose of 1 tab daily.  Re-check in 4 weeks.

## 2010-05-15 NOTE — Letter (Signed)
Summary: Handout Printed  Printed Handout:  - Coumadin Instructions-w/out Meds 

## 2010-05-15 NOTE — Medication Information (Signed)
Summary: rov/tm  Anticoagulant Therapy  Managed by: Eda Keys, PharmD Referring MD: Charlies Constable MD PCP: DR Lenon Ahmadi MD: Jens Som MD, Arlys John Indication 1: Atrial Fibrillation (ICD-427.31) Lab Used: [facility] Site: [facility] INR POC 3.4 INR RANGE 2 - 3  Dietary changes: no    Health status changes: no    Bleeding/hemorrhagic complications: no    Recent/future hospitalizations: no    Any changes in medication regimen? no    Recent/future dental: no  Any missed doses?: no       Is patient compliant with meds? yes       Allergies: No Known Drug Allergies  Anticoagulation Management History:      The patient is taking warfarin and comes in today for a routine follow up visit.  Positive risk factors for bleeding include an age of 75 years or older and presence of serious comorbidities.  The bleeding index is 'intermediate risk'.  Positive CHADS2 values include History of CHF, Age > 7 years old, and History of Diabetes.  The start date was 12/18/2001.  Her last INR was 2.6 RATIO.  Anticoagulation responsible provider: Jens Som MD, Arlys John.  INR POC: 3.4.  Cuvette Lot#: [id number].  Exp: 07/2010.    Anticoagulation Management Assessment/Plan:      The patient's current anticoagulation dose is Coumadin 5 mg tabs: Take as directed by coumadin clinic..  The target INR is 2 - 3.  The next INR is due 06/16/2009.  Anticoagulation instructions were given to patient.  Results were reviewed/authorized by Eda Keys, PharmD.  She was notified by Eda Keys.         Prior Anticoagulation Instructions: INR 2.3 contnue 5mg s daily. .Recheck in 4 weeks.   Current Anticoagulation Instructions: INR 3.4  Do not take coumadin today.  Then return to normal dosing schedule of 1 tablet every day. Return to clinic in 4 weeks.

## 2010-05-15 NOTE — Cardiovascular Report (Signed)
Summary: Office Visit   Office Visit   Imported By: Roderic Ovens 05/25/2009 14:46:07  _____________________________________________________________________  External Attachment:    Type:   Image     Comment:   External Document

## 2010-05-15 NOTE — Medication Information (Signed)
Summary: rov/tm  Anticoagulant Therapy  Managed by: Bethena Midget, RN, BSN Referring MD: Charlies Constable MD PCP: DR Lenon Ahmadi MD: Clifton James MD, Cristal Deer Indication 1: Atrial Fibrillation (ICD-427.31) Lab Used: [facility] [place] Site: [facility] INR POC 2.3 INR RANGE 2 - 3  Dietary changes: no    Health status changes: no    Bleeding/hemorrhagic complications: yes       Details: Had nosebleed during night Saturday Morning.   Recent/future hospitalizations: no    Any changes in medication regimen? no    Recent/future dental: no  Any missed doses?: no       Is patient compliant with meds? yes       Allergies: No Known Drug Allergies  Anticoagulation Management History:      The patient comes in today for her initial visit for anticoagulation therapy.  Positive risk factors for bleeding include an age of 75 years or older and presence of serious comorbidities.  The bleeding index is 'intermediate risk'.  Positive CHADS2 values include History of CHF, Age > 17 years old, and History of Diabetes.  The start date was 12/18/2001.  Her last INR was 2.6 RATIO.  Anticoagulation responsible provider: Clifton James MD, Cristal Deer.  INR POC: 2.3.  Cuvette Lot#: [id number].  Exp: 05/2010.    Anticoagulation Management Assessment/Plan:      The patient's current anticoagulation dose is Coumadin 5 mg tabs: Take as directed by coumadin clinic..  The target INR is 2 - 3.  The next INR is due 05/22/2009.  Anticoagulation instructions were given to patient.  Results were reviewed/authorized by Bethena Midget, RN, BSN.  She was notified by Bethena Midget, RN, BSN.         Prior Anticoagulation Instructions: INR 2.4 Continue 5mg s everyday. Recheck in 4 weeks.   Current Anticoagulation Instructions: INR 2.3 contnue 5mg s daily. .Recheck in 4 weeks.

## 2010-05-15 NOTE — Medication Information (Signed)
Summary: rov/ln  Anticoagulant Therapy  Managed by: Reina Fuse, Pharmd Referring MD: Charlies Constable MD PCP: DR Lenon Ahmadi MD: Shirlee Latch MD, Dalton Indication 1: Atrial Fibrillation (ICD-427.31) Lab Used: [facility] Site: [address] INR POC 3.3 INR RANGE 2 - 3  Dietary changes: yes       Details: Has not eaten any greens this past month.   Health status changes: no    Bleeding/hemorrhagic complications: no    Recent/future hospitalizations: no    Any changes in medication regimen? no    Recent/future dental: no  Any missed doses?: no       Is patient compliant with meds? yes       Current Medications (verified): 1)  Coreg 25 Mg Tabs (Carvedilol) .... Take One Tablet Two Times A Day 2)  Coumadin 5 Mg Tabs (Warfarin Sodium) .... Take As Directed By Coumadin Clinic. 3)  Furosemide 40 Mg Tabs (Furosemide) .... Take 1/2 Tablet Daily As Directed 4)  Zocor 40 Mg Tabs (Simvastatin) .... Take 1 Tablet By Mouth At Bedtime 5)  Aspirin 81 Mg Tbec (Aspirin) .... Take One Tablet By Mouth Daily 6)  Glimepiride 2 Mg Tabs (Glimepiride) .... 1/2  By Mouth Daily 7)  Losartan Potassium 100 Mg Tabs (Losartan Potassium) .... Take 1/2 Tab By Mouth Once Daily  Allergies (verified): No Known Drug Allergies  Anticoagulation Management History:      The patient is taking warfarin and comes in today for a routine follow up visit.  Positive risk factors for bleeding include an age of 75 years or older and presence of serious comorbidities.  The bleeding index is 'intermediate risk'.  Positive CHADS2 values include History of CHF, Age > 70 years old, and History of Diabetes.  The start date was 12/18/2001.  Her last INR was 2.6 RATIO.  Anticoagulation responsible provider: Shirlee Latch MD, Dalton.  INR POC: 3.3.  Cuvette Lot#: [id number].  Exp: 01/2011.    Anticoagulation Management Assessment/Plan:      The patient's current anticoagulation dose is Coumadin 5 mg tabs: Take as directed by coumadin clinic..   The target INR is 2 - 3.  The next INR is due 12/19/2009.  Anticoagulation instructions were given to patient.  Results were reviewed/authorized by Reina Fuse, Pharmd.  She was notified by Reina Fuse PharmD.         Prior Anticoagulation Instructions: INR 2.4  Continue same dose of 1 tab daily.  Re-check in 4 weeks.  Current Anticoagulation Instructions: INR 3.3  Do not Coumadin today, Monday, August 15th. Then, continue taking Coumadin 1 tab (5 mg) every day.  Return to clinic in 3 weeks.

## 2010-05-16 DIAGNOSIS — I4891 Unspecified atrial fibrillation: Secondary | ICD-10-CM

## 2010-05-16 DIAGNOSIS — Z7901 Long term (current) use of anticoagulants: Secondary | ICD-10-CM

## 2010-05-17 ENCOUNTER — Ambulatory Visit: Admit: 2010-05-17 | Payer: Self-pay

## 2010-05-17 NOTE — Medication Information (Signed)
Summary: ROV/ej  Anticoagulant Therapy  Managed by: Earvin Hansen, PharmD Referring MD: Charlies Constable MD PCP: DR Lenon Ahmadi MD: Daleen Squibb MD, Maisie Fus Indication 1: Atrial Fibrillation (ICD-427.31) Lab Used: [facility] Site: [facility] INR POC 2.6 INR RANGE 2 - 3  Dietary changes: no    Health status changes: no    Bleeding/hemorrhagic complications: no    Recent/future hospitalizations: no    Any changes in medication regimen? no    Recent/future dental: no  Any missed doses?: no       Is patient compliant with meds? yes       Allergies: No Known Drug Allergies  Anticoagulation Management History:      Positive risk factors for bleeding include an age of 75 years or older and presence of serious comorbidities.  The bleeding index is 'intermediate risk'.  Positive CHADS2 values include History of CHF, Age > 75 years old, and History of Diabetes.  The start date was 12/18/2001.  Her last INR was 2.6 RATIO.  Anticoagulation responsible provider: Daleen Squibb MD, Maisie Fus.  INR POC: 2.6.  Exp: 03/2011.    Anticoagulation Management Assessment/Plan:      The patient's current anticoagulation dose is Coumadin 5 mg tabs: Take as directed by coumadin clinic..  The target INR is 2 - 3.  The next INR is due 06/04/2010.  Anticoagulation instructions were given to patient.  Results were reviewed/authorized by Earvin Hansen, PharmD.         Prior Anticoagulation Instructions: INR:  3   Your INR is at the higher end of goal today.  Try some green leafy vegetables tonight.  Continue taking Coumadin 5 mg everyday and return to clinic in 3 weeks for another INR check  Current Anticoagulation Instructions: Continue taking coumadin as scheduled with 5 mg (1 tablet) daily.  INR=2.6

## 2010-05-17 NOTE — Cardiovascular Report (Signed)
Summary: Office Visit   Office Visit   Imported By: Roderic Ovens 03/29/2010 11:50:38  _____________________________________________________________________  External Attachment:    Type:   Image     Comment:   External Document

## 2010-05-17 NOTE — Medication Information (Signed)
Summary: rov/tp  Anticoagulant Therapy  Managed by: Geoffry Paradise, PharmD Referring MD: Charlies Constable MD PCP: DR Lenon Ahmadi MD: Jens Som MD, Arlys John Indication 1: Atrial Fibrillation (ICD-427.31) Lab Used: [facility] Site: [facility] INR RANGE 2 - 3  Dietary changes: no    Health status changes: no    Bleeding/hemorrhagic complications: no    Recent/future hospitalizations: no    Any changes in medication regimen? no    Recent/future dental: no  Any missed doses?: no       Is patient compliant with meds? yes       Allergies: No Known Drug Allergies  Anticoagulation Management History:      Positive risk factors for bleeding include an age of 75 years or older and presence of serious comorbidities.  The bleeding index is 'intermediate risk'.  Positive CHADS2 values include History of CHF, Age > 75 years old, and History of Diabetes.  The start date was 12/18/2001.  Her last INR was 2.6 RATIO.  Anticoagulation responsible provider: Jens Som MD, Arlys John.  Exp: 03/2071.    Anticoagulation Management Assessment/Plan:      The patient's current anticoagulation dose is Coumadin 5 mg tabs: Take as directed by coumadin clinic..  The target INR is 2 - 3.  The next INR is due 05/07/2010.  Anticoagulation instructions were given to patient.  Results were reviewed/authorized by Geoffry Paradise, PharmD.         Prior Anticoagulation Instructions: INR 2.7 Continue 5mg s daily. REcheck in 4 weeks.   Current Anticoagulation Instructions: INR:  3   Your INR is at the higher end of goal today.  Try some green leafy vegetables tonight.  Continue taking Coumadin 5 mg everyday and return to clinic in 3 weeks for another INR check

## 2010-05-17 NOTE — Medication Information (Signed)
Summary: rov/sl  Anticoagulant Therapy  Managed by: Bethena Midget, RN, BSN Referring MD: Charlies Constable MD PCP: DR Lenon Ahmadi MD: Johney Frame MD, Fayrene Fearing Indication 1: Atrial Fibrillation (ICD-427.31) Lab Used: [facility] Site: [facility] INR POC 2.5 INR RANGE 2 - 3  Dietary changes: no    Health status changes: no    Bleeding/hemorrhagic complications: no    Recent/future hospitalizations: no    Any changes in medication regimen? no    Recent/future dental: no  Any missed doses?: no       Is patient compliant with meds? yes       Current Medications (verified): 1)  Coreg 25 Mg Tabs (Carvedilol) .... Take One Tablet Two Times A Day 2)  Coumadin 5 Mg Tabs (Warfarin Sodium) .... Take As Directed By Coumadin Clinic. 3)  Furosemide 40 Mg Tabs (Furosemide) .... Take 1/2 Tablet Daily As Directed 4)  Zocor 40 Mg Tabs (Simvastatin) .... Take 1 Tablet By Mouth At Bedtime 5)  Aspirin 81 Mg Tbec (Aspirin) .... Take One Tablet By Mouth Daily 6)  Glimepiride 2 Mg Tabs (Glimepiride) .... 1/2  By Mouth Daily 7)  Losartan Potassium 100 Mg Tabs (Losartan Potassium) .... Take 1/2 Tab By Mouth Once Daily  Allergies (verified): No Known Drug Allergies  Anticoagulation Management History:      Positive risk factors for bleeding include an age of 75 years or older and presence of serious comorbidities.  The bleeding index is 'intermediate risk'.  Positive CHADS2 values include History of CHF, Age > 20 years old, and History of Diabetes.  The start date was 12/18/2001.  Her last INR was 2.6 RATIO.  Anticoagulation responsible Carmel Garfield: Allred MD, Fayrene Fearing.  INR POC: 2.5.  Exp: 03/2011.    Anticoagulation Management Assessment/Plan:      The patient's current anticoagulation dose is Coumadin 5 mg tabs: Take as directed by coumadin clinic..  The target INR is 2 - 3.  The next INR is due 03/26/2010.  Anticoagulation instructions were given to patient.  Results were reviewed/authorized by Bethena Midget,  RN, BSN.         Prior Anticoagulation Instructions: INR 2.7 Continue 5mg s everyday.  Recheck in 3 weeks.   Current Anticoagulation Instructions: The patient is to continue with the same dose of coumadin.  This dosage includes:  5mg  daily.  We will see you again before your other appointment on 12/5 at 1100 am

## 2010-06-04 ENCOUNTER — Encounter: Payer: Self-pay | Admitting: Cardiovascular Disease

## 2010-06-04 ENCOUNTER — Encounter (INDEPENDENT_AMBULATORY_CARE_PROVIDER_SITE_OTHER): Payer: Medicare Other

## 2010-06-04 DIAGNOSIS — Z7901 Long term (current) use of anticoagulants: Secondary | ICD-10-CM

## 2010-06-04 DIAGNOSIS — I4891 Unspecified atrial fibrillation: Secondary | ICD-10-CM

## 2010-06-12 NOTE — Medication Information (Signed)
Summary: rov/tm  Anticoagulant Therapy  Managed by: Bethena Midget, RN, BSN Referring MD: Charlies Constable MD PCP: DR Lenon Ahmadi MD: Clifton James MD, Cristal Deer Indication 1: Atrial Fibrillation (ICD-427.31) Lab Used: [facility] Site: [address] INR POC 2.9 INR RANGE 2 - 3  Dietary changes: no    Health status changes: no    Bleeding/hemorrhagic complications: no    Recent/future hospitalizations: no    Any changes in medication regimen? no    Recent/future dental: no  Any missed doses?: no       Is patient compliant with meds? yes       Allergies: No Known Drug Allergies  Anticoagulation Management History:      The patient is taking warfarin and comes in today for a routine follow up visit.  Positive risk factors for bleeding include an age of 75 years or older and presence of serious comorbidities.  The bleeding index is 'intermediate risk'.  Positive CHADS2 values include History of CHF, Age > 75 years old, and History of Diabetes.  The start date was 12/18/2001.  Her last INR was 2.6 RATIO.  Anticoagulation responsible provider: Clifton James MD, Cristal Deer.  INR POC: 2.9.  Cuvette Lot#: [id number].  Exp: 05/2011.    Anticoagulation Management Assessment/Plan:      The patient's current anticoagulation dose is Coumadin 5 mg tabs: Take as directed by coumadin clinic..  The target INR is 2 - 3.  The next INR is due 07/02/2010.  Anticoagulation instructions were given to patient.  Results were reviewed/authorized by Bethena Midget, RN, BSN.  She was notified by Bethena Midget, RN, BSN.         Prior Anticoagulation Instructions: Continue taking coumadin as scheduled with 5 mg (1 tablet) daily.  INR=2.6  Current Anticoagulation Instructions: INR 2.9 Continue 5mg s daily. Recheck in 4 weeks.

## 2010-07-02 ENCOUNTER — Encounter: Payer: Self-pay | Admitting: Internal Medicine

## 2010-07-02 ENCOUNTER — Encounter (INDEPENDENT_AMBULATORY_CARE_PROVIDER_SITE_OTHER): Payer: Medicare Other

## 2010-07-02 DIAGNOSIS — I4891 Unspecified atrial fibrillation: Secondary | ICD-10-CM

## 2010-07-02 DIAGNOSIS — Z7901 Long term (current) use of anticoagulants: Secondary | ICD-10-CM

## 2010-07-12 NOTE — Medication Information (Signed)
Summary: rov/tm  Anticoagulant Therapy  Managed by: Louann Sjogren, PharmD Referring MD: Charlies Constable MD PCP: DR Lenon Ahmadi MD: Tenny Craw MD, Gunnar Fusi Indication 1: Atrial Fibrillation (ICD-427.31) Lab Used: [facility] Site: [facility] INR POC 2.3 INR RANGE 2 - 3  Dietary changes: no    Health status changes: no    Bleeding/hemorrhagic complications: no    Recent/future hospitalizations: no    Any changes in medication regimen? no    Recent/future dental: no  Any missed doses?: no       Is patient compliant with meds? yes       Current Medications (verified): 1)  Coreg 25 Mg Tabs (Carvedilol) .... Take One Tablet Two Times A Day 2)  Coumadin 5 Mg Tabs (Warfarin Sodium) .... Take As Directed By Coumadin Clinic. 3)  Furosemide 40 Mg Tabs (Furosemide) .... Take 1/2 Tablet Daily As Directed 4)  Zocor 40 Mg Tabs (Simvastatin) .... Take 1 Tablet By Mouth At Bedtime 5)  Aspirin 81 Mg Tbec (Aspirin) .... Take One Tablet By Mouth Daily 6)  Glimepiride 2 Mg Tabs (Glimepiride) .... 1/2  By Mouth Daily 7)  Losartan Potassium 100 Mg Tabs (Losartan Potassium) .... Take 1/2 Tab By Mouth Once Daily  Allergies (verified): No Known Drug Allergies  Anticoagulation Management History:      The patient is taking warfarin and comes in today for a routine follow up visit.  Positive risk factors for bleeding include an age of 22 years or older and presence of serious comorbidities.  The bleeding index is 'intermediate risk'.  Positive CHADS2 values include History of CHF, Age > 65 years old, and History of Diabetes.  The start date was 12/18/2001.  Her last INR was 2.6 RATIO and today's INR is 2.3.  Anticoagulation responsible provider: Tenny Craw MD, Gunnar Fusi.  INR POC: 2.3.  Cuvette Lot#: [id number].  Exp: 05/2011.    Anticoagulation Management Assessment/Plan:      The patient's current anticoagulation dose is Coumadin 5 mg tabs: Take as directed by coumadin clinic..  The target INR is 2 - 3.  The  next INR is due 07/30/2010.  Anticoagulation instructions were given to patient.  Results were reviewed/authorized by Louann Sjogren, PharmD.  She was notified by Louann Sjogren PharmD.         Prior Anticoagulation Instructions: INR 2.9 Continue 5mg s daily. Recheck in 4 weeks.   Current Anticoagulation Instructions: INR 2.3 (goal 2-3)  Continue taking 5mg  (1 tablet) everyday.  Recheck in 4 weeks.

## 2010-07-16 ENCOUNTER — Telehealth: Payer: Self-pay | Admitting: Cardiology

## 2010-07-16 NOTE — Telephone Encounter (Signed)
Pt needs losartan and zocor..called in to [facility] # [phone]

## 2010-07-17 MED ORDER — LOSARTAN POTASSIUM 100 MG PO TABS: 50.0000 mg | ORAL_TABLET | Freq: Every day | ORAL | Status: DC

## 2010-07-17 MED ORDER — SIMVASTATIN 40 MG PO TABS: 40.0000 mg | ORAL_TABLET | Freq: Every evening | ORAL | Status: DC

## 2010-07-18 ENCOUNTER — Telehealth: Payer: Self-pay | Admitting: Internal Medicine

## 2010-07-18 NOTE — Telephone Encounter (Signed)
This is a cardiology medication that was originally given under the direction of Dr Charlies Constable. Looks like Dr Shirlee Latch is now seeing her. I have spoken to Dois Davenport at [facility] and she will make note and send to correct Dr.

## 2010-07-30 ENCOUNTER — Ambulatory Visit (INDEPENDENT_AMBULATORY_CARE_PROVIDER_SITE_OTHER): Payer: Medicare Other | Admitting: *Deleted

## 2010-07-30 DIAGNOSIS — I4891 Unspecified atrial fibrillation: Secondary | ICD-10-CM

## 2010-07-30 DIAGNOSIS — Z7901 Long term (current) use of anticoagulants: Secondary | ICD-10-CM

## 2010-07-30 LAB — POCT INR: INR: 2.4

## 2010-08-27 ENCOUNTER — Ambulatory Visit (INDEPENDENT_AMBULATORY_CARE_PROVIDER_SITE_OTHER): Payer: Medicare Other | Admitting: *Deleted

## 2010-08-27 DIAGNOSIS — Z7901 Long term (current) use of anticoagulants: Secondary | ICD-10-CM

## 2010-08-27 DIAGNOSIS — I4891 Unspecified atrial fibrillation: Secondary | ICD-10-CM

## 2010-08-27 LAB — POCT INR: INR: 2.4

## 2010-08-28 NOTE — Assessment & Plan Note (Signed)
Judith Zuniga                            CARDIOLOGY OFFICE NOTE   Judith Zuniga, Judith Zuniga                       MRN:          [id number]  DATE:09/16/2006                            DOB:          Aug 06, 1920    PRIMARY CARE PHYSICIAN:  Dr. Pearson Grippe at [facility].   PAST MEDICAL HISTORY:  Judith Zuniga is 77 years old and has had a redo  bypass surgery in 1998 by Dr. Andrey Campanile.  She has good left ventricular  function, but has moderate mitral regurgitation and has had paroxysmal  atrial fibrillation, has heart failure associated with atrial  fibrillation and mitral regurgitation, and diastolic dysfunction.  She  also has a DDD pacemaker in for sick sinus syndrome.   She says she has been doing quite well.  We did a generator change-out  last year with a Victory DDD pacemaker.  Her previous generator had  lasted only a short time.  She says she has had no recent chest pain,  shortness of breath, but she does have occasional palpitations.   PAST MEDICAL HISTORY:  Significant for hypertension, hyperlipidemia.   CURRENT MEDICATIONS:  Metoprolol.  Coumadin.  Aspirin.  Amiodarone.  Lasix.  Altace.  Vytorin.  Januvia, which was started newly for a new  diagnosis of diabetes.   EXAMINATION:  The blood pressure was 149/71, pulse 67 and regular.  There was no venous distension.  The carotid pulses were full without  bruits.  CHEST:  Clear.  CARDIAC:  Rhythm was regular.  There was a grade 2/6 pansystolic murmur  at the apex.  ABDOMEN:  Soft with normal bowel sounds.  There is no  hepatosplenomegaly.  The peripheral pulses are full and there is no peripheral edema.   We interrogated her pacemaker and she is pacing the ventricle very  little and pacing the atrium a good percentage of the time.  She has  good thresholds in both leads and good longevity.   IMPRESSION:  1. Coronary artery disease status post redo bypass surgery in 1998      with  patent grafts in January of 2007.  2. Sick sinus syndrome with paroxysmal atrial fibrillation,      uncontrolled on amiodarone.  3. Status post dual-mode, dual-pacing, dual-sensing pacemaker with      recent generator change using a victory dual-mode, dual-pacing,      dual-sensing pacemaker with good pacer function.  4. Congestive heart failure related to mitral regurgitation, atrial      fibrillation, and diastolic dysfunction now controlled.  5. Hypertension.  6. Hyperlipidemia.  7. Recent-onset diabetes.   RECOMMENDATIONS:  I think Judith Zuniga is doing quite well.  Her blood  pressure is not under optimum control and I asked her to check her blood  pressures over the next couple of weeks and call us with the results.  If she is running over 130 consistently, then we will increase her out-  takes from 10 to 20 a day, and then we will need to get a BNP and a week  after that change.  Her baseline creatinine  was 1.5 on her recent labs.  I will plan to see her back in followup in 6 months.   ADDENDUM:  She had an echocardiogram performed today, which showed  moderate mitral regurgitation and good left ventricular function with an  ejection fraction of 50%.     Bruce Elvera Lennox Juanda Chance, MD, [facility]  Electronically Signed    BRB/MedQ  DD: 09/16/2006  DT: 09/16/2006  Job #: [id number]   cc:   Dr. Pearson Grippe

## 2010-08-28 NOTE — Assessment & Plan Note (Signed)
[facility]                            CARDIOLOGY OFFICE NOTE   MARCAYLA, BUDGE                       MRN:          [id number]  DATE:03/17/2007                            DOB:          1920-06-12    PRIMARY CARE PHYSICIAN:  Dr. Massie Maroon.   CLINICAL COURSE:  Ms. Mckenzy Salazar returns for follow-up management of  her coronary artery disease and diastolic heart failure.  She is 77  years old and has had redo bypass surgery in 1998.  She has good LV  function and moderate mitral regurgitation.  She has had paroxysmal  atrial fibrillation and has had congestive heart failure.  She has had  paroxysmal atrial fibrillation and in the past has had some congestive  heart failure related to her atrial fibrillation and mitral  regurgitation with sick sinus syndrome.   She says she has been doing well and had no recent symptoms of chest  pain, shortness of breath or palpitations.   PAST MEDICAL HISTORY:  1. Significant for hypertension.  2. Hyperlipidemia.   CURRENT MEDICATIONS:  1. Coumadin.  2. Aspirin.  3. Amiodarone.  4. Lasix.  5. Altace.  6. Vytorin.  7. Januvia.  8. Metoprolol.   PHYSICAL EXAMINATION:  VITAL SIGNS:  Blood pressure 139/83, pulse 85 and  irregular.  NECK:  Venous pulsation reveals about 2 cm above the clavicle.  Carotid  pulses full without bruits.  CHEST:  Clear without rales or rhonchi.  HEART:  Rhythm irregular.  There is a grade 3/6 pan-systolic murmur at  the apex.  ABDOMEN:  Soft, with normal bowel sounds.  There is no  hepatosplenomegaly.  EXTREMITIES:  There is no peripheral edema.  Pedal pulses equal.   Her electrocardiogram showed atrial fibrillation and left bundle branch  block.   We interrogated her pacemaker and she had good threshold ventricularly  and was in atrial fibrillation.  She appeared to be in atrial  fibrillation approximately 17% of the time.   IMPRESSION:  1. Coronary artery disease,  status post re-do coronary artery bypass      graft surgery in 1998, with patent grafts in January 2007.  2. Sick sinus syndrome with paroxysmal atrial fibrillation.  3. Status post DVD pacemaker implantation with generator change in      2008, with a Victory DV pacemaker.  4. History of congestive heart failure related to mitral      regurgitation, diastolic dysfunction and atrial fibrillation, now      complicated.  5. Hypertension.  6. Hyperlipidemia.  7. Diabetes.   RECOMMENDATIONS:  I think Ms. Bradstreet is doing fairly well.  She is  having a fair amount of atrial fibrillation and is only on 100 mg of  amiodarone a day.  We will plan to increase her amiodarone to 200 mg  daily.  Will get a BMP, beta natriuretic peptide, CBC and TSH.   FOLLOWUP:  I will plan to see her back in followup in six months.     Bruce Elvera Lennox Juanda Chance, MD, [facility]  Electronically Signed    BRB/MedQ  DD:  03/17/2007  DT: 03/18/2007  Job #: [id number]

## 2010-08-28 NOTE — Assessment & Plan Note (Signed)
[facility]                            CARDIOLOGY OFFICE NOTE   Judith, Zuniga                       MRN:          [id number]  DATE:04/27/2007                            DOB:          1921/01/15    PRIMARY CARE PHYSICIAN:  Dr. Pearson Grippe, he took over Dr. Janice Coffin  place.   CLINICAL HISTORY:  Judith Zuniga returned for a follow-up visit after a  recent hospitalization for congestive heart failure.  She is 77 years  old and has had redo bypass surgery in 1998.  She has previously had  good LV function and moderate mitral regurgitation and atrial  fibrillation.  She has a DDD pacemaker in place which is a Engineer, water. Jude  device.  She recently was admitted with symptoms of shortness of breath  and volume overload and felt to have congestive heart failure.  She was  diuresed, improved and was discharged home.  She had a follow-up  echocardiogram which showed an ejection fraction of 25% - 30% which was  new from a previously ejection fraction of 45%.  She also had a Myoview  scan which showed inferior and anterior ischemia and an ejection  fraction of 33%.   She says she has felt better since her discharge home.  She has had no  shortness of breath.  She has had some headaches and Dr. Selena Batten ordered a  CT which showed no hemorrhage.  She also had a fall and developed some  bruising.   PAST MEDICAL HISTORY:  1. Hypertension.  2. Hyperlipidemia.  3. Diabetes.   CURRENT MEDICATIONS:  Include Coumadin, aspirin, Lasix, Vytorin,  metoprolol, Altace, Januvia and amiodarone.   EXAMINATION:  The blood pressure is 125/70 and pulse 90 and irregular.  Jugular venous pulsations were visible just a sonometer above the  clavicle.  The carotid pulses were full.  CHEST:  Clear without rales or rhonchi.  CARDIAC:  Rhythm was irregular.  There is a grade 2/6 pansystolic murmur  at the apex.  ABDOMEN:  Soft with normal bowel sounds.  There is no peripheral edema  and pedal pulses were equal.   Her electrocardiogram showed atrial fibrillation with a wide left bundle  branch block overriding her pacemaker.  Her pacer function showed good  threshold on the ventricular lead and she was in atrial fibrillation  most all the time.  Her rates were not too fast.   IMPRESSION:  1. Recent systolic congestive heart failure, now appears close to      euvolemic and probably Class III.  2. Coronary artery disease status post redo coronary bypass graft      surgery in 1998 with patent grafts January 2007.  3. Recent ischemic Myoview scan.  4. Atrial fibrillation which has now become chronic.  5. Status post St. Jude Victory DDD pacemaker implantation.  6. Mitral regurgitation.  7. Hypertension.  8. Hyperlipidemia.  9. Diabetes.  10.Ejection fraction 25%.   RECOMMENDATIONS:  Judith Zuniga has had significant fall in her left  ventricular function and she has an ischemic Myoview scan.  We will plan  to bring her in the hospital the end of this week after taking her off  Coumadin for a right and left heart catheterization and grafts.  Her  Coumadin was 4.3 after taking extra doses following her discharge from  the hospital.  Will have her come in for an INR on Wednesday to see if  it will be feasible to do her on Thursday, otherwise we will postpone it  until next week.  She has a very wide QRS with left bundle branch block  and her ejection fraction now is 25% so she may be a candidate for BiV  pacing and ICD if she not a candidate for revascularization.     Bruce Elvera Lennox Juanda Chance, MD, [facility]  Electronically Signed    BRB/MedQ  DD: 04/27/2007  DT: 04/27/2007  Job #: [id number]

## 2010-08-28 NOTE — Cardiovascular Report (Signed)
NAME:  Judith Zuniga, Judith Zuniga                ACCOUNT NO.:  [id number]   MEDICAL RECORD NO.:  [id number]          PATIENT TYPE:  INP   LOCATION:  6526                         FACILITY:  [facility]   PHYSICIAN:  Everardo Beals. Juanda Chance, MD, FACCDATE OF BIRTH:  04/01/1921   DATE OF PROCEDURE:  04/30/2007  DATE OF DISCHARGE:                            CARDIAC CATHETERIZATION   PRIMARY CARE PHYSICIAN:  Dr. Selena Batten in Dr. Janice Coffin office.   CLINICAL HISTORY:  Ms. Gadberry is 52 years and has had a redo bypass  surgery.  She also has had atrial fibrillation that has become chronic  and refractory to amiodarone although her amiodarone has not yet been  discontinued.  She also has a DDD pacer in place.  She recently was  admitted with congestive heart failure.  She had a followup  echocardiogram, which showed a falling ejection fraction from 40% to 45%  to 25% to 30%.  She also had a Myoview scan, which suggested ischemia  and a reduced ejection fraction in the low 30% range.  She was admitted  today for evaluation with angiography.  We stopped her Coumadin on  Sunday and her INR was 2.2 this morning.  We decided to proceed with the  procedure.  She also has a creatinine of 2.0 and she was given  bicarbonate protocol prior to her procedure.   PROCEDURE:  Right heart catheterization was performed percutaneously via  the right femoral artery using an arterial sheath and 6-French preformed  coronary catheters.  A front wall arterial puncture was performed and  Omnipaque contrast was used.  Left heart catheterization was performed  percutaneously via the left femoral artery using an arterial sheath and  5-French preformed coronary catheters.  A front wall arterial puncture  was performed and Omnipaque contrast was used.  JR-4 catheters were used  for injection of the LIMA graft and the vein graft.  No left  ventriculogram was performed to save contrast.  The patient tolerated  the procedure well and left the  laboratory in satisfactory condition.   RESULTS:  Left main coronary artery had a 50% ostial stenosis.  The left  anterior descending artery gave rise to a diagonal branch and was  completely occluded.  There was competing flow in the diagonal branch.   The circumflex artery was completely occluded in its origin.   The right coronary artery.  The right coronary  was a moderate-sized  vessel that gave rise to a right ventricular branch and posterior  descending branch and then was completely occluded before 2  posterolateral branches.  There was 90% proximal and 90% distal stenosis  in the right coronary artery and there was 90%  stenosis at the ostium  of the posterior descending branch.   The saphenous vein graft to the posterior descending and posterolateral  branch of the right coronary artery and distal circumflex artery was  patent and functioned normally.  The distal circumflex artery filled  retrograde and filled 2 small marginal branches.   The RIMA graft to the first and second diagonal branch of the LAD was  patent and  functioned normally.  The diagonal vessels were small vessels  and the second diagonal branch had a 90% stenosis in the sub-branch.   The LIMA graft to the LAD was patent and functioned normally.   No left ventriculogram was performed.   HEMODYNAMIC DATA:  Right atrial pressure was 11 mean.  The pulmonary  artery pressure was 50/17 with mean of 31.  Pulmonary  wedge pressure  was 23 mean.  Left ventricle pressure was 121/16.  Aortic pressure was  121/64 with a mean of 88.  Cardiac output/ cardiac index was 2.0/1.2  m/minute/m squared.  Pulmonary artery saturation was 57%.  Aortic  saturation was 96%.   CONCLUSION:  1. Coronary artery disease, status post redo coronary bypass graft      surgery.  2. Severe native vessel disease with 50% stenosis in the left main      coronary artery, total occlusion of the left anterior descending      and circumflex  arteries, and 90% stenosis in the proximal and      distal right coronary artery with total occlusion of the right      coronary after the posterior descending branch.  3. Patent sequential vein graft to the posterior descending branch and      posterolateral branch of the right coronary and distal circumflex      artery, patent free RIMA graft to the first and second diagonal      branch of the LAD, and patent LIMA graft to the LAD.  4. Severe left ventricular dysfunction by noninvasive studies with an      estimated fraction of 25% to 30%.   RECOMMENDATIONS:  All the grafts are patent and there does not appear to  be any major source of ischemia.  The patient has severe left  ventricular dysfunction.  It does not appear that this is rate related.  She only had rates above 100 about 4% of the time on a recent pacemaker  interrogation.  Her underlying ECG is left bundle branch block.  I think  she may benefit from a BiV pacer, and we have asked Dr. Graciela Husbands to see her  in consultation tomorrow.  I will discuss with her whether she would  like to have an ICD or not.      Bruce Elvera Lennox Juanda Chance, MD, [facility]  Electronically Signed     BRB/MEDQ  D:  04/30/2007  T:  04/30/2007  Job:  [id number]

## 2010-08-28 NOTE — Assessment & Plan Note (Signed)
[facility]                            CARDIOLOGY OFFICE NOTE   Judith Zuniga, Judith Zuniga                       MRN:          [id number]  DATE:10/29/2007                            DOB:          04/08/1921    PRIMARY CARE PHYSICIAN:  Massie Maroon, MD   CLINICAL HISTORY:  Judith Zuniga returned for followup visit for  management of her coronary heart disease and systolic heart failure.  She is an 77 year old and had redo bypass surgery in 1988.  She recently  developed increasing worsening heart failure and her ejection fraction  was found to 25-30%.  She also had left bundle-branch block.  We did a  cath and all her grafts were patent.  We referred her to Dr. Graciela Husbands to  implant a BiV pacer and bi-ICD.  She initially did well, but after her  last visit with Dr. Graciela Husbands, she says she has had more difficulty and  developed profound weakness and decreased energy.  She is not any fluid  that she can tell.   PAST MEDICAL HISTORY:  Significant for chronic atrial fibrillation,  hypertension, hyperlipidemia, diabetes, and elevated liver function  tests on amiodarone.   CURRENT MEDICATIONS:  1. Altace 15 mg daily.  2. Januvia.  3. Furosemide 40 mg 1-1/2 tablet every other day.  4. Vytorin 10/40 daily.  5. Aspirin 81 mg daily.  6. Coreg 25 mg b.i.d..   PHYSICAL EXAMINATION:  VITAL SIGNS:  The blood pressure is 150/70 and  pulse 70 and regular.  NECK:  There was no venous distension.  The carotid pulses were full  without bruits.  CHEST:  Clear without rales or rhonchi.  HEART:  Rhythm was regular.  There was short systolic murmur.  ABDOMEN:  Soft with normal bowel sounds.  EXTREMITIES:  There is no peripheral edema.   On interrogation of pacemaker, she had frequent PVCs.  Her pacemaker was  programmed from VVIR to VVTR, so that when she has PVCs, she will BiV  pace.  Her ventricle had a very short interval after the sensing.   IMPRESSION:  1. Systolic  congestive heart failure class III, but euvolemic.  2. Coronary artery status post redo bypass surgery in 1998 with patent      grafts recently.  3. Status post recent BiV pacer and AV node ablation, but no ICD.  4. Chronic atrial fibrillation.  5. Abnormal liver function test, on amiodarone.  6. Hypertension.  7. Hyperlipidemia.  8. Diabetes.  9. Ejection fraction 25%.  10.Frequent PVCs.   RECOMMENDATIONS:  We have programmed her.  Judith Zuniga has increased  symptoms of fatigue and the reason is not clear.  It may be related to  her frequent PVCs and hopefully VVT pacing will help.  Her blood  pressure slightly elevated.  We will increase her Altace to 20 mg daily.  We will get a BMP, CBC, and TSH today and get another BMP a week after  changing Altace.  If she is not improved with changing a pacer mode she  is to let us know and we will  decide regarding further evaluation.  We  may perhaps consider therapy for her PVCs.     Bruce Elvera Lennox Juanda Chance, MD, [facility]  Electronically Signed    BRB/MedQ  DD: 10/29/2007  DT: 10/30/2007  Job #: [id number]

## 2010-08-28 NOTE — Assessment & Plan Note (Signed)
HEALTHCARE                         ELECTROPHYSIOLOGY OFFICE NOTE   NOREENE, BOREMAN                       MRN:          [id number]  DATE:08/07/2007                            DOB:          02-25-1921    Ms. Andujar is seen following CRT implantation with AV junction ablation  for uncontrolled atrial arrhythmias.  This was done in January.  Her  exercise tolerance is much improved with significantly less fatigue.  She is able to go all day long.  She does not notice any particular  change in her breathing.   She does describe an episode of chest discomfort that was midsternal a  little bit to the left that lasted mostly seconds.  She thinks it was  quite distinct from her previous coronary pain.   When she last saw Dr. Juanda Chance in March, he noted that her blood pressure  was elevated at that time.  Altace was increased. Coreg was increased.  BMET checked thereafter was notable for BUN and creatinine of 27/1.4  which was a little bit high.   Her medications currently include Lasix 40 mg a day, having been up-  titrated from 20 every other day to maybe 20 alternating with 40.   PHYSICAL EXAMINATION:  VITAL SIGNS:  Her blood pressure today, however,  was still elevated at 152/68, her pulse was 64.  NECK:  Her neck veins were flat.  LUNGS:  Clear.  CHEST:  Device pocket was well-healed.  Her heart sounds were regular.  ABDOMEN:  Soft.  EXTREMITIES:  Without edema.  SKIN:  Warm and dry.   Interrogation of her [employer] Frontier BiV pacer demonstrated no  intrinsic ventricular rhythm.  The RV impedance was 470 with a threshold  1.25 at 0.4.  The LV impedance was 570 with a threshold of 1 volt at  0.4.  Heart rate excursion was relatively flat, but she has no symptoms  regarding limitations of activity.  Her device was reprogrammed.   IMPRESSION:  1. Atrial fibrillation with an uncontrolled ventricular response.  2. Status post atrioventricular  junction ablation and CRT-P for the      above.  Implantable cardioverter-defibrillator deferred because of      comorbidities and age.  3. Coronary artery disease.      a.     Prior coronary artery bypass graft.      b.     Recurrent chest discomfort.  Doubt ischemia.  4. Hypertension, still elevated.  5. Evidence on last blood work of increasing prerenal azotemia in the      setting of increasing diuretics.   Ms. Popson is doing well from a functional point of  view following her  pacemaker implantation and AV junction ablation.  I do not think her  chest pain is cardiac, as noted, and I have asked her to follow up with  Dr. Juanda Chance in the event that it recurs.   Given the fact that her blood pressure remains elevated and she  tolerated the up-titration of her Coreg from 12 to 18.75, I have taken  the liberty of increasing it  further to 25 b.i.d., and I have refilled  her prescription for 3 months at that dosing level.   We will plan to recheck her BMET today, given the elevation in her BUN  and creatinine that was noted at the end of March, and then we will need  to make some adjustments and clarify her diuretic dosing.     Duke Salvia, MD, [facility]  Electronically Signed    SCK/MedQ  DD: 08/07/2007  DT: 08/07/2007  Job #: [id number]

## 2010-08-28 NOTE — Assessment & Plan Note (Signed)
[facility] HEALTHCARE                            CARDIOLOGY OFFICE NOTE   KYILEE, GREGG                       MRN:          [id number]  DATE:05/19/2007                            DOB:          December 04, 1920    PRIMARY CARE PHYSICIAN:  Massie Maroon, MD   PAST MEDICAL HISTORY:  Ms. Blinn returns for a followup visit  following her recent BiV pacer implantation and AV node ablation and for  management of her systolic congestive heart failure.  She is 77 years  old and had redo bypass surgery in 1998.  She developed increasing  symptoms of shortness of breath, and on noninvasive testing had  decreased LV function.  We brought her in for a catheterization.  All of  her grafts were patent, but her ejection fraction was 25-30%.  She has  underlying left bundle branch block and she was referred to Dr. Berton Mount, who implanted a BiV pacer.  We decided not to put an ICD in  because of her age and comorbidities.  Because she had inadequate rate  control with her atrial fibrillation, he also did an AV node ablation  the next day.  Her atrial fibrillation has not been chronic, but it has  been present more than 50% of the time.   She also has had moderate mitral regurgitation.   PAST MEDICAL HISTORY:  Significant for hypertension, hyperlipidemia and  diabetes.   MEDICATIONS:  Altace, Coumadin, aspirin, Lasix, vytorin, Tylenol,  Januvia, Coreg.   EXAMINATION:  The blood pressure was 110/63 and the pulse 90, regular.  Venous distention was visible right at the clavicle.  The carotid pulses  were full.  CHEST:  Clear without rales or rhonchi.  CARDIAC: Rhythm was regular.  There was a 2/6 systolic murmur at the  apex.  ABDOMEN:  Soft without organomegaly.  Peripheral pulses were full with no peripheral edema.   IMPRESSION:  1. Systolic congestive heart failure now better compensated class II      and euvolemic.  2. Status post recent BiV pacer and AV node  ablation.  3. Coronary artery disease status post redo coronary artery bypass      graft surgery 1998 with patent grafts recently.  4. Atrial fibrillation, which has become present most of the time.  5. Abnormal liver function tests on amiodarone.  6. Hypertension.  7. Hyperlipidemia.  8. Diabetes.  9. Ejection fraction 25%.   RECOMMENDATIONS:  Ms. Pelto is doing much better.  Her rate is still  at 90, so I think we ought to program that down.  We will get a BMP and  CBC on her today.  I think we can probably  take the Steri-Strips off since it has been two weeks.  I will plan to  see her back in followup in six weeks.     Bruce Elvera Lennox Juanda Chance, MD, [facility]  Electronically Signed    BRB/MedQ  DD: 05/19/2007  DT: 05/19/2007  Job #: [id number]

## 2010-08-28 NOTE — Discharge Summary (Signed)
NAMEMONTOYA, WATKIN                ACCOUNT NO.:  [id number]   MEDICAL RECORD NO.:  [id number]          PATIENT TYPE:  INP   LOCATION:  6533                         FACILITY:  [facility]   PHYSICIAN:  Massie Maroon, MD        DATE OF BIRTH:  May 27, 1920   DATE OF ADMISSION:  05/04/2007  DATE OF DISCHARGE:  24-Apr-202009                               DISCHARGE SUMMARY   ADDENDUM   This is a change in followup.  She will follow up with Dr. Juanda Chance,  Tuesday, February 3, at noon.  This has already been scheduled.  At that  time, a basic metabolic panel will be taken and Dr. Juanda Chance will enlist  the folks with the pacer clinic to interrogate her Bi-V pacer and to  turn down the rate of pacing from 90 to 80.      Maple Mirza, [place]      Massie Maroon, MD  Electronically Signed    GM/MEDQ  D:  24-Apr-202009  T:  24-Apr-202009  Job:  [id number]   cc:   Everardo Beals. Juanda Chance, MD, [facility]

## 2010-08-28 NOTE — Assessment & Plan Note (Signed)
[facility] HEALTHCARE                            CARDIOLOGY OFFICE NOTE   Judith Zuniga, Judith Zuniga                       MRN:          [id number]  DATE:01/27/2008                            DOB:          1920/05/05    PRIMARY CARE PHYSICIAN:  Massie Maroon, MD   CLINICAL HISTORY:  Judith Zuniga is 89 and returned for a followup  management of her coronary heart disease and congestive heart failure.  She had a redo bypass surgery in 1988.  She has had systolic heart  failure and has ICD BiV pacer.  She also has chronic atrial fibrillation  status post AV nodal ablation.  Her last catheterization was performed  in January 2009, at which time all her grafts were patent.   When I saw her last time she was having symptoms of fatigue, but these  have improved and she now feels like she is doing fairly well.  She has  not had any swelling or any chest pain or palpitations and feels like at  her current level of activity is not having much shortness of breath.   PAST MEDICAL HISTORY:  Significant for hypertension, hyperlipidemia, and  diabetes.  She has a history of elevated liver function test, on  amiodarone.   CURRENT MEDICATIONS:  1. Januvia.  2. Furosemide 40 mg one-half tablet every other day.  3. Vytorin.  4. Aspirin.  5. Coreg 25 b.i.d.  6. Altace 10 b.i.d.   PHYSICAL EXAMINATION:  VITAL SIGNS:  Blood pressure is 136/60 and the  pulse is 60 and regular.  NECK:  There was no venous distension.  The carotid pulses were full  without bruits.  CHEST:  Clear.  I could hear no rales or rhonchi.  CARDIAC:  Rhythm was regular.  There is a grade 2/6 pansystolic murmur  at the apex and left sternal edge.  I cannot hear any diastolic murmur.  ABDOMEN:  Soft with normal bowel sounds.  There is no peripheral edema  and the pedal pulses were equal.   We interrogated her pacemaker today and she had good thresholds on both  the right and left ventricular leads.  She is  pacer-dependent.  She is  programmed VVTR so that she will pace bi-V when she has PVCs because she  had frequent PVCs before.  She is ventricularly paced 74% of the time.  I presume that when she senses her PVCs that this does not count as a  paced beat even though she  paces once the sensing occurs in the VVT  mode.   IMPRESSION:  1. Coronary artery disease status post redo bypass surgery in 1998      with patent grafts in January 2009.  2. Ischemic cardiomyopathy, ejection fraction of 25%.  3. Systolic heart failure class II and euvolemic.  4. Status post biventricular pacer and status post atrioventricular      nodal ablation, but no implantable cardioverter-defibrillator.  5. Chronic atrial fibrillation.  6. Abnormal liver function test, on amiodarone.  7. Hypertension.  8. Hyperlipidemia.  9. Diabetes.  10.Frequent premature ventricular contractions.  RECOMMENDATIONS:  Judith Zuniga is doing much better.  We will not need to  make any change in her medications today.  We will get a BMP, BNP, and  CBC today.  I will see him back in followup in 4 months.     Bruce Elvera Lennox Juanda Chance, MD, [facility]  Electronically Signed    BRB/MedQ  DD: 01/27/2008  DT: 01/28/2008  Job #: [id number]   cc:   Massie Maroon, MD

## 2010-08-28 NOTE — Op Note (Signed)
Judith Zuniga, Judith Zuniga                ACCOUNT NO.:  [id number]   MEDICAL RECORD NO.:  [id number]          PATIENT TYPE:  INP   LOCATION:  6533                         FACILITY:  [facility]   PHYSICIAN:  Doylene Canning. Ladona Ridgel, MD    DATE OF BIRTH:  1920/07/04   DATE OF PROCEDURE:  05/05/2007  DATE OF DISCHARGE:                               OPERATIVE REPORT   PROCEDURE PERFORMED:  AV node ablation utilizing His-Purkinje mapping.   INTRODUCTION:  The patient is a very pleasant 77 year old woman who has  a history of atrial fibrillation which has been chronic, unfortunately  associated with a rapid ventricular response.  She is status post  pacemaker insertion in the past and underwent upgrade to a BiV pacemaker  one day ago.  Because her uncontrolled ventricular response and AFib,  she is now referred for AV node ablation.   DESCRIPTION OF PROCEDURE:  After informed consent was obtained, the  patient was taken to the diagnostic EP lab in a fasting state.  After  the usual preparation and draping, intravenous fentanyl and Valium were  given for sedation.  A 7-French quadripolar catheter was inserted  percutaneously in the right femoral vein and advanced to the right  atrium.  Mapping was carried out in the His bundle region.  A single RF  energy application was delivered for 60 seconds, resulting in the  creation of a complete heart block.  The patient was observed for  approximately 15 minutes and had no recurrent conduction through her AV  node.  The catheter was then removed.  Hemostasis was assured, and the  patient was returned to her room in satisfactory condition.  Prior to  return to the room, the patient's device was reprogrammed VVI with a  lower rate of 90.   COMPLICATIONS:  There were no major procedure complications.   RESULTS:  This demonstrates successful AV node ablation in a patient  with chronic atrial fibrillation status post BiV pacemaker insertion.      Doylene Canning. Ladona Ridgel,  MD  Electronically Signed     GWT/MEDQ  D:  05/05/2007  T:  05/05/2007  Job:  [id number]   cc:   Everardo Beals. Juanda Chance, MD, [facility]

## 2010-08-28 NOTE — Assessment & Plan Note (Signed)
[facility] HEALTHCARE                            CARDIOLOGY OFFICE NOTE   ZANAYA, BAIZE                       MRN:          [id number]  DATE:07/02/2007                            DOB:          08-16-1920    PRIMARY CARE PHYSICIAN:  Freada Bergeron.   CLINICAL HISTORY:  Ms. Cartwright returns for follow-up visit after a  recent hospitalization for implantation of a Bi-V pacer and 4 AV nodal  ablation.  She is 77 years old and redo bypass surgery in 1998.  She  recently developed increasing symptoms of congestive heart failure with  worsening LV function with an ejection fraction in the 25-30% range.  She underwent catheterization which showed that all her grafts were  patent.  She has left bundle branch block.  She underwent placement of a  Bi-V pacer by Dr. Graciela Husbands and we decided not with an ICD because of her  age and comorbidities.   She has done quite well since that time.  Says she had no chest pain,  shortness breath or palpitations.  She also has atrial fibrillation  which is now present most the time.  We stopped her Amiodarone due to  elevation in pulmonary function tests and because it was not effective  in controlling her rhythm.   Her past medical history is known for hypertension, hyperlipidemia and  diabetes.  The current medications include Altace 10 mg daily, Lasix 20  mg 1/2 tablet every other day, Coreg 12.5 mg b.i.d., Januvia, Vytorin,  aspirin and Coumadin.   On examination today blood pressure 155/71 and pulse 73 and regular.  Venous pulsation was visible 2 cm above the clavicle.  CHEST:  Was clear.  CARDIAC:  Rhythm was regular.  There was 2/6 systolic murmur at the  apex.  I can hear no diastolic murmur.  ABDOMEN:  Soft, normal bowel sounds.  There is no hepatosplenomegaly.  EXTREMITIES:  No peripheral edema.  Pedal pulses are equal.   We interrogated her pacemaker and she is programmed VVIR and had good  thresholds on both the right  and left ventricular leads.  Her rate  response was adjusted slightly.   IMPRESSION:  1. Systolic congestive heart failure now improved in class II and      close to euvolemic.  2. Status post recent Bi-V pacer and AV nodal ablation.  3. Coronary artery disease status post redo coronary bypass graft      surgery in 1998 with patent grafts recently.  4. Atrial fibrillation persistent.  5. Abnormal liver function tests on amiodarone.  6. Hypertension  7. Hyperlipidemia  8. Diabetes.  9. Ejection fraction 25%.   RECOMMENDATIONS:  I think Ms. Pawling is doing well at present.  Her  blood pressure is a little high and we will increase the Coreg to 12.5  mg 1.5 tablets twice a day and will increase her Altace from 10-15 a  day.  Will get a BMP and CBC today and get another BMP in a week after  increasing her Altace.  I will see her back in 4 months and  she is  scheduled to see Dr. Graciela Husbands in 1 month.     Bruce Elvera Lennox Juanda Chance, MD, [facility]  Electronically Signed    BRB/MedQ  DD: 07/02/2007  DT: 07/02/2007  Job #: [id number]

## 2010-08-28 NOTE — Assessment & Plan Note (Signed)
[place] [facility]CARE                         ELECTROPHYSIOLOGY OFFICE NOTE   CORRENA, MEACHAM                       MRN:          [id number]  DATE:06/03/2007                            DOB:          Aug 16, 1920    Ms.  Wooley is seen a month following CRT upgrade for her congestive  heart failure in the setting of atrial fibrillation, uncontrolled  ventricular response.  Her shortness of breath is much improved.  She is  having some problems with dizziness.  This may be some worse, it is  largely orthostatic in nature.   MEDICATIONS:  1. Altace 5.  2. Coreg 12.5 b.i.d.  3. Januvia 50.  4. Vytorin 10/40.  5. Lasix 40.  6. Coumadin.  7. Aspirin.   PHYSICAL EXAMINATION:  VITAL SIGNS:  Her blood pressure is 112/70, her  pulse was 84.  LUNGS:  Clear.  HEART:  Her heart sounds were regular.  EXTREMITIES:  Without edema.  NECK:  Her neck veins were flat.  CHEST:  Her device incision had a little bit of erythema over the medial  aspect of it.   Her device was reprogrammed from 80-70.   IMPRESSION:  1. Systolic congestive heart failure - chronic - improved.  2. Atrial fibrillation - chronic status post AV junction ablation.  3. Orthostatic intolerance, question related to need for down-      titration of her medications.   PLAN:  1. We will plan to check her BMET today to make sure she is not mildly      dehydrated, which can happen following CRT upgrade.  2. I have recommended six inch blocks underneath the head of her bed.  3. Will put her on Keflex 250 q.8h. for five days to treat any      overlying incisional infection.  4. She is suppose to follow with Dr. Edwyna Shell in the next couple of      weeks and we will wait hear from him if there is anything else that      we might be able to do.     Duke Salvia, MD, [facility]  Electronically Signed   SCK/MedQ  DD: 06/03/2007  DT: 06/04/2007  Job #: [id number]

## 2010-08-28 NOTE — Discharge Summary (Signed)
NAME:  DURENDA, PECHACEK                ACCOUNT NO.:  [id number]   MEDICAL RECORD NO.:  [id number]          PATIENT TYPE:  OBV   LOCATION:  2030                         FACILITY:  [facility]   PHYSICIAN:  Veverly Fells. Excell Seltzer, MD  DATE OF BIRTH:  1921-03-06   DATE OF ADMISSION:  04/03/2007  DATE OF DISCHARGE:  04/06/2007                               DISCHARGE SUMMARY   PROCEDURE:  None.   FINAL DISCHARGE DIAGNOSES:  1. Shortness of breath.  2. Acute on chronic diastolic congestive heart failure.  3. Chronic kidney disease stage III.   SECONDARY DIAGNOSES:  1. Chest pain, cardiac enzymes negative for myocardial infarction and      outpatient Myoview planned.  2. Status post aortocoronary bypass surgery x 2 most recently in 1998.  3. Catheterization in 2006 with severe native three-vessel disease and      patent grafts, but distal disease in the diagonal branch and right      coronary artery system with medical therapy recommended.  4. Ischemic cardiomyopathy with an ejection fraction of 40-45% at cath      as well as moderate mitral regurgitation.  5. Hypertension.  6. Hyperlipidemia.  7. Borderline diabetes with a hemoglobin A1c of 6.1 this admission.  8. History of paroxysmal atrial fibrillation, status post direct      current cardioversion and frequent mode switching by pacemaker      evaluation this admission.  9. Status post Victory XL/DDR [facility] Jude pacemaker for sick sinus      syndrome (generator change out for end-of-life performed).  10.Chronic Coumadin with an INR of 2.1 this admission.   ALLERGIES/INTOLERANCE:  MORPHINE.   FAMILY HISTORY:  Coronary artery disease in her mother who died at age  53.   Time of discharge 36 minutes.   HOSPITAL COURSE:  Judith Zuniga is an 77 year old female with known  coronary artery disease.  She reported a 5 day history of exertional  neck tightness which she states is her angina.  She also had some  orthopnea.  She came to the emergency  room where her BNP was  significantly elevated at 1886.  A chest x-ray did not show acute  pulmonary edema, but she was felt to be volume overloaded by exam as she  had significant JVD and bilateral rales with a few crackles as well.  She was admitted for further evaluation and diuresis.   Her cardiac enzymes were negative for MI.  Her chest pain resolved once  her respiratory status improved.  Her BUN and creatinine were elevated  with a BUN and creatinine of  18/1.56 on admission and after diuresis her BUN was 30 with a creatinine  1.97.  Her GFR at that time was 24.  Her diuretics were held and at  discharge her BUN was 30 with a creatinine 1.52 and GFR of 33.  She is  to go back on Lasix at her home dose and follow her weights carefully.  Dr. Juanda Chance felt that cardiac catheterization should be reserved for  elevated cardiac enzymes or recurrent symptoms.  Her symptoms resolved  and  her cardiac enzymes were negative, so she is scheduled for an  outpatient echocardiogram to evaluate her heart failure and Myoview.  Additionally because of her chronic kidney disease, a BMET will be  checked in a week.  On April 06, 2007, she was evaluated by Dr.  Excell Seltzer who felt she was stable for discharge with close outpatient  followup.   DISCHARGE INSTRUCTIONS:  1. Her activity level is to be increased gradually.  2. She is to stick to a low-sodium diabetic diet.  3. She is to weigh self daily.  4. She is to follow up with the [facility] on April 14, 2007      at 11 a.m.  5. She has an appointment for an echocardiogram on April 14, 2007      at 11:30 and a Myoview at 12:30.  She is to be n.p.o. after      midnight except for clear liquids up to 8:30 on the day of the      stress test.  6. She is to follow up with Dr. Juanda Chance on April 27, 2007 at 4 p.m.      and Dr. Selena Batten as needed.   DISCHARGE MEDICATIONS:  1. Coumadin 2.5 mg daily except for 5 mg on Wednesday.  2. Vytorin 40/10  daily.  3. Metoprolol 50 mg b.i.d.  4. Altace 2.5 mg daily.  5. Januvia 100 mg one-half tablet daily  6. Furosemide 40 mg a day.  7. Amiodarone 200 mg a day.  8. Nitroglycerin sublingual p.r.n.  9. Aspirin 81 mg a day.      Theodore Demark, PA-C      Veverly Fells. Excell Seltzer, MD  Electronically Signed    RB/MEDQ  D:  04/06/2007  T:  04/06/2007  Job:  [id number]   cc:   Massie Maroon, MD

## 2010-08-28 NOTE — Assessment & Plan Note (Signed)
[facility]                            CARDIOLOGY OFFICE NOTE   Judith, Zuniga                       MRN:          [id number]  DATE:05/19/2008                            DOB:          01-02-1921    PRIMARY CARE PHYSICIAN:  Massie Maroon, MD   CLINICAL HISTORY:  Judith Zuniga is 77 years old and returns for followup  management of coronary heart disease and congestive heart failure.  She  had bypass surgery in 1985 and redo bypass surgery in 1998.  She has had  systolic heart failure and has an ICD and IV pacer in place.  This was  put in January 2009.  She also has chronic atrial fibrillation status  post AV nodal ablation.  Her last catheterization in January 2009 showed  all patent grafts.   She had been doing well recently with no chest pain, shortness breath,  or palpitations.   Her past medical history is significant for hypertension,  hyperlipidemia, and diabetes.  She has a history of elevated liver  function tests on amiodarone.   CURRENT MEDICATIONS:  1. Januvia.  2. Vytorin.  3. Aspirin.  4. Coreg 25 b.i.d.  5. Altace 10 mg b.i.d.  6. Furosemide 40 mg daily.   On examination, her blood pressure is 110/54 and pulse 70 and regular.  There is no venous distention.  The carotid pulse were full without  bruits.  Chest was clear without rales or rhonchi.  Cardiac rhythm was  regular with extrasystoles.  There was a short systolic murmur.  The  abdomen was soft.  Normal bowel sounds.  There is no peripheral edema  and pedal pulses are equal.   Electrocardiogram showed ventricular pacing.  Interrogation of pacemaker  showed good thresholds on both leads.  She is pacer dependent.   IMPRESSION:  1. Coronary artery disease status post redo bypass surgery in 1998      with patent graft in January 2009.  2. 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  3. Systolic heart failure, class II and euvolemic.  4. Status post  biventricular pacer and status post atrioventricular      nodal ablation with no implantable cardioverter-defibrillator.  5. Chronic atrial fibrillation.  6. Hypertension.  7. Hyperlipidemia.  8. Diabetes.  9. Frequent premature ventricular contractions.  10.Elevated liver function tests on amiodarone.   RECOMMENDATIONS:  I think Judith Zuniga is doing well.  We will get the  laboratory work today including a lipid liver, CBC, and BMP.  She has  not had an echogram since her by BiV pacer.  We will plan to check that  to reevaluate her LV function.  I will plan to see her back in followup  in 6 months.     Bruce Elvera Lennox Juanda Chance, MD, [facility]  Electronically Signed    BRB/MedQ  DD: 05/19/2008  DT: 05/19/2008  Job #: [id number]

## 2010-08-28 NOTE — Op Note (Signed)
Judith Zuniga, Judith Zuniga                ACCOUNT NO.:  [id number]   MEDICAL RECORD NO.:  [id number]          PATIENT TYPE:  INP   LOCATION:  2807                         FACILITY:  [facility]   PHYSICIAN:  Duke Salvia, MD, FACCDATE OF BIRTH:  [birth year]   DATE OF PROCEDURE:  05/04/2007  DATE OF DISCHARGE:                               OPERATIVE REPORT   PREOPERATIVE DIAGNOSES:  1. Atrial fibrillation.  2. Chronic systolic congestive heart failure.   POSTOPERATIVE DIAGNOSES:  1. Atrial fibrillation.  2. Chronic systolic congestive heart failure.   PROCEDURES:  1. Explantation of a previously-implanted generator.  2. Implantation of a new generator with left ventricular lead      placement.   Following the obtaining of informed consent, the patient was brought to  the electrophysiology laboratory and placed on the fluoroscopic table in  the supine position.  After routine prep and drape of the left upper  chest, lidocaine was infiltrated about 1 cm cephalad to the previous  incision and the incision was carried down to layer of the prepectoral  fascia using sharp dissection.  At this point venipuncture was  accomplished using a micropuncture system without difficulty.  The  guidewire was placed.  The 5-French sheath was placed.  Through this we  placed a Wholey wire and then a Medtronic MB-2 coronary sinus  cannulation catheter was utilized, which allowed for ready access to the  coronary sinus, which was, interestingly, quite apically displaced.  In  any case, following its cannulation a  puff venogram demonstrated our  location and two balloon venograms, one in the LAO and one in the AP  projection, were taken with a 2:1 contrast mix so a total contrast  burden of 6 mL was required.  This demonstrated a high lateral branch  that coursed down onto the posterolateral surface, which with modest  difficulty and the use an Attain 90-degree cannulation sheath, we were  able to place the  wire and then through were able to pass a St. Jude  1158T, serial number [id number] lead was passed.  It had to be passed past  the rim of the of wall in the RAO projection; otherwise, we had  diaphragmatic stimulation, in fact, when we withdrew the sheath and  pulled back a little bit, so I put the soft wire in, pushed it forward  just a hair, and we were able to get it back to where the amplitude was  13.6 mV with a pace impedance of 883 ohms, a threshold 1.6 V at 0.5 msec  and there was, as noted, no diaphragmatic pacing at 10 V.  This lead was  secured to the prepectoral fascia.  Interrogation of the previously-  implanted St. Jude 1688-TC ventricular lead demonstrated a serial number  of [id number] with an R wave of 10, impedance of 324, a threshold 0.25 at  0.5, and the 1688-T active-fixation atrial lead, serial number [id number]  with a P-wave, that is, a fibrillation wave of 0.3 and an impedance of  380 ohms.  To get at these previously-implanted leads, recalling that  the incision  was made cephalad to the previous incision, we then had to  dissect caudal from this incision to open up the device pocket.  This  was accomplished just prior to the aforementioned measurements and this  was to minimize exposure of the chronic hardware.  Hemostasis was  obtained and all leads were then attached to a St. Jude Frontier 5586  CRT pulse generator, serial number [id number].  Intermittent ventricular  pacing was identified with a shortening of the QRS from about 180 msec  to 120 msec.   Surgicel was used to the cephalad aspect of the incision following the  copious irrigation with antibiotic-containing saline solution.  Hemostasis had been assured prior to that.  We then closed the wound  actually in just two layers  using Monocryl on both layers.  The wound was then washed, dried, and a  benzoin and Steri-Strip dressing was applied.  Needle counts, sponge  counts and instrument counts were correct at  the end of the procedure  according to the staff.  The patient tolerated the procedure without  apparent complication.      Duke Salvia, MD, [facility]  Electronically Signed     SCK/MEDQ  D:  05/04/2007  T:  05/04/2007  Job:  [id number]   cc:   Everardo Beals. Juanda Chance, MD, [facility]  Electrophysiology Laboratory  [facility]

## 2010-08-28 NOTE — Discharge Summary (Signed)
Judith Zuniga, Judith Zuniga                ACCOUNT NO.:  [id number]   MEDICAL RECORD NO.:  [id number]          PATIENT TYPE:  INP   LOCATION:  6533                         FACILITY:  [facility]   PHYSICIAN:  Maple Mirza, PA   DATE OF BIRTH:  04/20/1920   DATE OF ADMISSION:  05/04/2007  DATE OF DISCHARGE:  04-13-202009                               DISCHARGE SUMMARY   FINAL DIAGNOSIS:  1. Discharging day #2 status post implantation of a St. Jude Frontier      II  model 5586CRT-P pacing system.  Identifying spaces (explant of      existing pacemaker implanted for sick sinus syndrome).  2. Discharging day #1 status post AV node ablation.  3. Left bundle branch block.   SECONDARY DIAGNOSES:  1. Recent admission Christmastime 2008 for acute-on-chronic [place]      Heart Association class IIIB chronic systolic congestive heart      failure.  2. Echocardiogram April 14, 2007, ejection fraction 25% to 30%.      Identifying spaces declined - previous 45%.  Severe MR, moderate      TR.  3. Coronary artery disease status post CABG with redo 1998.  4. Myoview study April 14, 2007, with ejection fraction 32%,      inferobasal SCAR, mild peri infarct ischemia.  5. Left heart catheterization April 30, 2007, all grafts patent.  6. Atrial fibrillation/Coumadin.  7. Hypertension.  8. Dyslipidemia.  9. Diabetes.  10.Degenerative joint disease.   PROCEDURE THIS ADMISSION:  1. May 04, 2007, explant of existing dual-chamber pacemaker with      implant of a St. Jude Fronteir II Bi space pacer.  2. May 05, 2007, AV node ablation by Dr. Lewayne Bunting, pacemaker      was put in by Dr. Sherryl Manges.   BRIEF HISTORY:  This is a an 77 year old female.  She has a history of  three-vessel coronary artery disease.  She is status post coronary  artery bypass graft surgery and she had a redo in 1998.  She was  admitted four days at [facility] with progressive dyspnea.  She was  up four nights in a  chair with orthopnea.  Diagnosis at that time was  acute-on-chronic [place] Heart Association class IIIB/IV  systolic/diastolic congestive heart failure.  A 2-D echocardiogram done  at that time showed an ejection fraction 25% to 30% with decreased left  ventricular diastolic compliance.  She also demonstrated severe mitral  regurgitation and moderate tricuspid regurgitation.  This finding  represents a decrease from her previous ejection fraction estimated at  45%.  A followup study was done with a Myoview study on April 14, 2007, showing ejection fraction 32% with inferior basal SCAR and minor  peri-infarct ischemia.   As a follow up to the Myoview study.  She saw Dr. Juanda Chance January 12.  He  suggested left heart catheterization.  This was done January 15.  All  grafts were patent.  An electrophysiology consult was scheduled.  She  was seen by Dr. Sherryl Manges January 16 prior to discharge.  He  recommended  explantation of her existing pacemaker with implantation of  a, CRD-P system for biventricular pacing.  Arrangements are made for the  patient to be admitted to [facility] Monday, January 19.   HOSPITAL COURSE:  The patient was admitted electively on May 04, 2007, she underwent explantation of her existing pacemaker with  implantation of the IV pacemaker system by Dr. Graciela Husbands.  This was  successful.  No hematoma.  Following the procedure, she then underwent  AV node ablation the next day on January 20.  She is ready for discharge  postprocedure day #1.  Incision care has been discussed with the  patient, as has mobility of the left arm.  She also has extensive  followup with [facility] November 26, [address]   MEDICATIONS:  Her medications also have been changed slightly:  1. Addition of Keflex for a five-day treatment 1 tablet 1/2 hour      before breakfast, lunch and dinner of a 250-mg Keflex.  2. Stop metoprolol.  3. Coreg 12.5 mg twice  daily.  This is a new dose.  She had previously      been on 6.25 mg twice daily, started at left heart catheterization      April 30, 2007.  4. The patient will be on Coumadin 5-mg tablets.  Her INR on the day      of discharge has been 1.3.  On Wednesday January 21 she will take      7.5 mg, Thursday January 22 she will take 5 mg, Friday January 23      she will take 5, and on Saturday and Sunday 2.5 mg.  She will come      to the Coumadin Clinic Monday, January 26 at 9:45 o'clock.  5. Other medications include Altace 5 mg daily.  This is also new dose      down from 10 mg daily.  6. Enteric-coated aspirin 81 mg daily.  7. Lasix 40 mg daily.  8. Vytorin 10/40 daily at bedtime.  9. Januvia 100 mg 1/2 tablet daily.   DISCHARGE INSTRUCTIONS:  She is asked to keep her incision dry for next  six days and to sponge bathe until Monday, January 26.   DISCHARGE FOLLOWUP:  1. Follow up with [facility] November 26, [address]      and Coumadin Clinic Monday, January 26 at 9:45 o'clock.  2. Pacer clinic Wednesday, February 4 at 9:40.  A basic metabolic      panel will be taken and her device will be turned down from 90      beats a minute to 80 beats a minute.  3. Pacer clinic to see Dr. Graciela Husbands Wednesday, February 18 at 1:45 p.m.      Another basic metabolic panel and pacer check with the device      turned from 80 beats a minute to 70 beats a minute.  4. To see Dr. Graciela Husbands Friday, August 07, 2007, at 9:15 in the morning.   LAB STUDIES PERTINENT TO THIS ADMISSION:  Hemoglobin is 11.6, hematocrit  34.3, platelets 122,000, white cells 6.7 on January 21.  Serum  electrolytes January 21 show sodium 138, potassium 3.6, chloride 104,  carbonate 28, BUN 18, creatinine 1.23, glucose 104.  Once again, on the  discharge day January 21, protime is 16.5 and INR 1.3      Maple Mirza, [place]     GM/MEDQ  D:  February 07, 202009  T:  11-07-2007  Job:  [id number]   cc:   Everardo Beals. Juanda Chance, MD,  [facility]  Duke Salvia, MD, [facility]  Doylene Canning. Ladona Ridgel, MD  [facility] in [place] office  Massie Maroon, MD

## 2010-08-28 NOTE — H&P (Signed)
NAME:  Judith Zuniga, Judith Zuniga                ACCOUNT NO.:  [id number]   MEDICAL RECORD NO.:  [id number]          PATIENT TYPE:  INP   LOCATION:  1826                         FACILITY:  [facility]   PHYSICIAN:  Everardo Beals. Juanda Chance, MD, FACCDATE OF BIRTH:  02-02-1921   DATE OF ADMISSION:  04/03/2007  DATE OF DISCHARGE:                              HISTORY & PHYSICAL   PRIMARY CARE PHYSICIAN:  Dr. Rocky Link at [facility].   PRIMARY CARDIOLOGIST:  Everardo Beals. Juanda Chance, MD, [facility]   CHIEF COMPLAINT:  Shortness of breath and chest pain.   HISTORY OF PRESENT ILLNESS:  Judith Zuniga is an 77 year old female with a  history of coronary artery disease.  She has approximately 4 to 5-day  history of exertional chest pain.  Her symptoms are described as a neck  tightness.  Initially it was with exertion such as walking to the  mailbox.  She has also had nocturnal symptoms which have disturbed her  sleep.  She has associated shortness of breath but denies nausea,  vomiting or diaphoresis.  She has not had increased fatigue lately and  admits to orthopnea but no PND.  Her weight has not changed recently.  She has not had palpitations.  She has not had presyncope or syncope.  The neck tightness is her usual angina and has been previously  associated with treatment for coronary artery disease.  She has chronic  dyspnea on exertion, but this is worse, although she has not had resting  shortness of breath.  Currently she is resting comfortably on O2.   PAST MEDICAL HISTORY:  1. Status post aortocoronary bypass surgery x2, last surgery in 1998.  2. Status post cardiac catheterization November 2006 showing 80% left      main, LAD and circumflex totaled, RCA multiple 80% lesions and PDA      90% lesion, LIMA to LAD patent. RIMA to diagonal patent, but a      branch of the diagonal distally had a 70% stenosis. SVG to RCA and      posterolateral was patent, but there was distal disease.  Medical      therapy was recommended.  3. Ischemic cardiomyopathy with an EF of 40-45% at catheterization.  4. History of moderate MR.  5. Chronic diastolic congestive heart failure.  6. Hypertension.  7. Hyperlipidemia.  8. Borderline diabetes.  9. Paroxysmal atrial fibrillation status post direct current      cardioversion.  10.Sick sinus syndrome status post pacemaker and generator change out      for end of life, Victory XL DDR St. Jude device.  11.Chronic anticoagulation with Coumadin.   SURGICAL HISTORY:  She is status post cardiac catheterizations as well  as bypass surgery x2, pacemaker generator change out..   ALLERGIES:  MORPHINE causes nausea and vomiting.   CURRENT MEDICATIONS:  1. Coumadin 2.5 mg daily except for 5 mg on Wednesday.  2. Vytorin 40/10.  3. Metoprolol 50 mg a.m., 25 mg p.m.  4. Amiodarone 200 mg a day.  5. Aspirin 81 mg a day.  6. Lasix 40 mg tablets alternating 1 tablet and  1/2 tablet daily.  7. Altace 1.5 mg daily.  8. Januvia 50 mg, not taking secondary to financial issues.   SOCIAL HISTORY:  She lives in [place] alone and is currently working  part time Education officer, [employer] and has retired from business.  She has no  history of alcohol, tobacco or drug abuse.   FAMILY HISTORY:  For mother died at age 75 of an MI, and her father died  at age 81 in his sleep; she was told it was a heart attack, but no  autopsy was done.  She has no siblings with coronary artery disease.   REVIEW OF SYSTEMS:  She has had no fevers, chills or sweats.  She gets  hoarseness every winter but denies nasal discharge or sore throat.  The  shortness of breath is as described above.  She occasionally hears  herself wheeze.  She has chronic pain issues in her neck and back.  She  denies reflux symptoms, hematemesis, hemoptysis or melena.  Full 14-  point Review of Systems is otherwise negative.   PHYSICAL EXAMINATION:  VITAL SIGNS:  Temperature is 97.2, blood pressure  initially 141/93, pulse 103, respiratory  rate 20, O2 saturation 94% on  room air.  GENERAL:  She is a well-developed elderly white female in no acute  distress.  HEENT:  Normal.  NECK:  There is no lymphadenopathy, thyromegaly or bruit noted.  She has  JVD greater than 8 cm.  CARDIOVASCULAR:  Heart is slightly irregular in rate and rhythm with an  S1-S2, and systolic murmur is noted.  Distal pulses are 1+ in all four  extremities, and a faint left femoral bruits appreciated.  LUNGS:  She has dense rales in the bases and a few crackles as well.  SKIN:  No rashes or lesions are noted.  ABDOMEN:  Soft and nontender with active bowel sounds.  EXTREMITIES:  There is no cyanosis, clubbing or edema noted.  MUSCULOSKELETAL:  There is no joint deformity or effusions and no spine  or CVA tenderness.  NEUROLOGIC:  She is alert and oriented with cranial nerves II-XII  grossly intact.   Chest x-ray and labs are pending.   EKG is atrial fibrillation by appearance with a left bundle branch block  which is old.   IMPRESSION:  1. Congestive heart failure:  She has acute on chronic diastolic heart      failure.  She will receive IV Lasix today and then hopefully      transition back to p.o. Lasix in the morning.  Labs will be      followed closely with treatment changes depending on the results.  2. Dull pain which is her anginal equivalent:  She will be admitted,      and cardiac enzymes will be cycled.  If she is not having resting      symptoms, we will not add IV nitroglycerin.  Catheterization will      be reserved for elevated troponins or if she has recurrent ischemic      symptoms.  3. History of atrial fibrillation:  She appears to be in atrial      fibrillation at this time and may be pacing part of the time as      well.  The St. Jude representative has been contacted and will      interrogate her pacemaker.  We will increase her a amiodarone and      her beta blocker for better rate control and to hopefully  maintained  sinus rhythm.  4. Anticoagulation:  Her Coumadin is currently on hold.  However, it      can be restarted if her cardiac enzymes are negative and no      catheterization is planned.  5. She is otherwise stable and will be continued on her home      medications.  Target discharge is Sunday or      Monday, and she has outpatient followup arranged with an      echocardiogram and a Myoview on December 30 and a followup visit      with Dr. Juanda Chance on January 12.  Additionally, she had Coumadin      clinic followup on December 31.      Theodore Demark, PA-C      Bruce R. Juanda Chance, MD, [facility]  Electronically Signed    RB/MEDQ  D:  04/03/2007  T:  04/04/2007  Job:  [id number]   cc:   Dr. Rocky Link [facility]

## 2010-08-28 NOTE — Discharge Summary (Signed)
NAME:  Judith Zuniga, Judith Zuniga                ACCOUNT NO.:  [id number]   MEDICAL RECORD NO.:  [id number]          PATIENT TYPE:  INP   LOCATION:  6526                         FACILITY:  [facility]   PHYSICIAN:  Everardo Beals. Juanda Chance, MD, FACCDATE OF BIRTH:  03/24/1921   DATE OF ADMISSION:  04/30/2007  DATE OF DISCHARGE:  05/01/2007                               DISCHARGE SUMMARY   ALLERGIES:  This patient has an AMIODARONE INTOLERANCE with elevated  LFTs.   DISCHARGE PROCESS:  Dictation and examination and preparation for  readmittance greater than 45 minutes.   FINAL DIAGNOSIS:  Discharging day #1, status post left heart  catheterization.  This study showed that all her bypass grafts were  patent including the free right internal mammary artery to the diagonal,  the left internal mammary artery to the left anterior descending and a  sequential saphenous vein graft to the right coronary artery and to the  ramus intermediate.  She has severe native vessel disease.  Left  ventriculogram was not taken.   SECONDARY DIAGNOSES:  1. Recent admission at [facility] 2008 for acute on chronic [place]      [place] Heart Association class IIIB systolic/diastolic congestive      heart failure.      a.     Orthopnea 4 nights prior to admission.  2. Two-dimensional echocardiogram, April 14, 2007, ejection      fraction 25% to 30% with decreased left ventricular diastolic      compliance.  This represents a decreased from previous ejection      fraction noted to be 45%.  Marland Kitchen  a.  Severe mitral regurgitation and moderate tricuspid regurgitation.  1. Myoview study, April 14, 2007:  Ejection fraction 32%, inferior      basal scar, mild peri-infarct ischemia.  2. History of three-vessel coronary artery disease, status post      coronary artery bypass graft surgery with redo coronary artery      bypass graft surgery in 1998.  3. Ischemic cardiomyopathy with decline in ejection fraction from 45%      to 25% to 30% by  Myoview and echocardiogram, both done December      2008.  4. Prior evidence of myocardial infarction with inferior scarring.  5. History of paroxysmal atrial fibrillation, now chronic atrial      fibrillation, Coumadin therapy.  6. Pacemaker implanted, 2003, for sick sinus syndrome, a St. Jude      device; it was an Identity dual-chamber device.      a.     Change-out, May 2007, with implantation of St. Jude Victory       XL DR pacemaker.  7. Left bundle branch block.  8. Stage III chronic kidney disease.  9. Diabetes diagnosis 2-3 months ago.  10.Hypertension.  11.Dyslipidemia.  12.Degenerative joint disease.  13.Severe mitral regurgitation and moderate tricuspid regurgitation.   PROCEDURE:  April 30, 2007, left heart catheterization.  The study  showed that the left anterior descending was occluded after the first  diagonal, but the distal left anterior descending was free of  significant disease.  The left  circumflex was occluded proximally.  It  is fed through the ramus intermediate by a sequential graft.  The right  coronary artery has sequential 90% stenoses throughout the vessel; it is  fed through a saphenous vein graft to the distal right coronary artery.  The grafts are all patent as a free right internal mammary artery to  first diagonal, which then courses to the second diagonal.  The left  internal mammary artery to the mid left anterior descending is patent  and the saphenous vein graft to the distal right coronary artery and  then sequentially to the ramus intermediate is also patent.   BRIEF HISTORY:  Ms. Golding is an 77 year old female.  She has a history  of three-vessel coronary artery disease.  She is status post coronary  artery bypass graft surgery with redo in 1998.  She was admitted 4 days  at [facility] 2008 with progressive dyspnea.  She was up 4 nights in  a chair with orthopnea.  Diagnosis at that time was acute on chronic [place]  [place] Heart  Association class IIIB/IV systolic/diastolic congestive heart  failure.  A 2-D echocardiogram was done at that time, ejection fraction  25% to 30% with decreased left ventricular diastolic compliance and  severe mitral regurgitation and moderate tricuspid regurgitation; this  represents a decrease from her previous ejection fraction of 45%.  Myoview study was done April 14, 2007, ejection fraction of 32%,  inferior basal SCAR and minor peri-infarct ischemia.   She saw Dr. Juanda Chance in followup, Monday, January 12.  He suggested that a  left heart catheterization would be appropriate; this was done April 30, 2007, all grafts being patent.  He then scheduled an  electrophysiology consult.  She was seen by Dr. Sherryl Manges on January  16, prior to discharge.  He recommended explantation of her existing  pacemaker with implantation of a CRT-P system for biventricular pacing.  Arrangements have been made for the patient to be readmitted to [facility] through [facility], Monday, January 19 at 9:30.   DISCHARGE MEDICATIONS:  1. Coumadin.  She is to hold off on taking this until after her      procedure, January 19.  2. Enteric-coated aspirin 81 mg daily.  3. Lasix 40 mg daily.  4. Vytorin 10/40 daily at bedtime.  5. Metoprolol 50 mg twice daily.  6. Altace 10 mg daily.  7. Januvia 100 mg one-half tablet daily.  She is to hold off on taking      this the morning of January 19.  8. She is to stop taking amiodarone altogether.  9. She is to start Coreg 6.25 mg twice daily; this is a new      medication.   FOLLOWUP:  She will return to [facility] at 9:30,  Monday, January 19.  She is asked to eat nothing after midnight, Sunday,  January 18.  She also has an office visit with Dr. Juanda Chance at [facility], [address], Tuesday, February 3, at noon.   LABORATORY STUDIES PERTINENT TO THIS ADMISSION:  Complete blood count:  White cell count  5.7, hemoglobin 11.2, hematocrit 33.1, platelets  118,000.  Serum electrolytes:  Sodium 138, potassium 3.7, chloride 101,  bicarbonate 31, BUN is 30, creatinine 1.45, glucose 95.  Pro time is  26.7, INR 2.4.      Maple Mirza, PA      Bruce R. Juanda Chance, MD, [facility]  Electronically Signed  GM/MEDQ  D:  05/01/2007  T:  05/01/2007  Job:  [id number]   cc:   Massie Maroon, MD  Duke Salvia, MD, [facility]

## 2010-08-31 NOTE — Cardiovascular Report (Signed)
NAME:  Judith Zuniga, Judith Zuniga                          ACCOUNT NO.:  [id number]   MEDICAL RECORD NO.:  [id number]                   PATIENT TYPE:  OIB   LOCATION:  2871                                 FACILITY:  [facility]   PHYSICIAN:  Charlies Constable, M.D. [facility]              DATE OF BIRTH:  December 30, 1920   DATE OF PROCEDURE:  03/09/2002  DATE OF DISCHARGE:                              CARDIAC CATHETERIZATION   CLINICAL HISTORY:  The patient is 77 years old and had redo bypass surgery  in 1998. She also has had recent atrial fibrillation and underwent DC  cardioversion  but reverted back to atrial fibrillation. She has had  persistent symptoms of  shortness of breath with exertion. She also has had  elevated neck veins on physical examination. We made the decision to bring  her in for evaluation with right and left heart catheterization  to evaluate  her filling  pressures and her coronary circulation to rule out an ischemic  etiology for her shortness of breath.   PROCEDURES PERFORMED:  1. Right heart catheterization.  2. Left heart catheterization.  3. Selective coronary angiography.  4. Internal mammary graft.  5. Left ventriculography.   DESCRIPTION OF PROCEDURE:  The procedure was performed by the right  femoral  artery using an arterial sheath and a 6 French preformed coronary catheters.  A front wall arterial puncture was performed and Omnipaque contrast was  used. The right heart catheterization  was performed percutaneously via the  right femoral vein using a venous sheath and a Swann-Ganz _____ catheter.  We used JL4 catheter for injection of the vein grafts and the free RIMA  graft. We used a LIMA catheter for injection of the LIMA graft. The patient  tolerated the procedure well and left the laboratory in satisfactory  condition. We closed the right femoral artery with Perclose at the end of  the procedure.   RESULTS:  1. The left main coronary artery:  The  underlying left main  coronary had a     50% ostial and a 50% distal stenosis.  2. The left anterior descending artery:  The left anterior descending artery     had competing flow distally and there was a 90% stenosis in its     midportion.  3. Left circumflex artery:  The left circumflex artery was completely     occluded after a small intermediate branch. The intermediate branch had a     90% stenosis.  4. The right coronary artery:  The right coronary was a moderately large     vessel that gave rise to a marginal branch and a posterior descending     branch and then was completely occluded. There was a 90% proximal     stenosis and 80% distal stenosis and a 90% stenosis in the posterior     descending branch.  5. There was a Y saphenous vein graft  to the distal right and distal     circumflex system. One portion of the Y graft fed the  posterior     descending branch. The second portion of the Y fed an acute marginal     branch, the posterolateral branch of the right coronary artery, a     posterolateral branch of the circumflex artery and three small marginal     branches of the circumflex artery. This graft was patent and functioned     normally.  6. The free RIMA graft to the first and second diagonal branches of the LAD     was patent. There was 70% stenosis in the first diagonal branch to the     saphenous vein anastomosis and a 90% stenosis in one branch of the second     diagonal branch to the saphenous vein anastomosis.  7. The LIMA graft to the LAD was patent and functioned normal and the distal     LAD was free of major obstruction.  8. The left ventriculogram:  The left ventriculogram performed in the RAO     projection showed hypokinesis of the apex. The rest of the wall motion     was good and the ejection fraction was estimated at 55%. There was 2+     mitral regurgitation.   HEMODYNAMIC DATA:  The right atrial pressure was 12 mean. The right  ventricular pressure was 61/10. The pulmonary  artery pressure was 61/23 with  a mean of 41. The pulmonary wedge pressure was 28 mean. The  left  ventricular pressure was 168/20. The aortic pressure was 168/68 with a mean  of 100. The cardiac output/cardiac index by _____ was 3.5/2 L/min per meter  squared.   CONCLUSION:  1. Coronary artery disease, status post redo coronary artery bypass surgery     in 1998. Severe native vessel disease with 60% stenosis of the left main,     90% stenosis of the left anterior descending artery, total occlusion of     the circumflex artery and 90% proximal and 80% and 90% distal stenosis in     the right coronary artery.  2. Patent and normal functioning sequential vein graft to the posterior     descending branch of the right coronary artery, acute marginal branch of     the right coronary artery, the posterolateral branch of the right     coronary artery, posterolateral branch of the circumflex artery  and     three small marginal branches of the circumflex artery, and a patent     sequential LIMA graft, pre RAMI to the first diagonal branch and second     diagonal branch of the left anterior descending artery with a 70%     narrowing in the first diagonal branch after the anastomosis and 90%     narrowing in the second diagonal branch after the anastomosis and a     patent LIMA graft to the LAD.  3. Good LV function with apical hypokinesis and moderate mitral     regurgitation.  4. Elevated pulmonary wedge pressure of 28 mmHg and elevated pulmonary     artery pressure of 41 mmHg.   RECOMMENDATIONS:  The patient's circulation looks quite good, although she  does have some disease in the native diagonals after the graft insertion  sites. The main problem is elevated filling pressures which is probably a  combination of diastolic dysfunction and mitral regurgitation. This is  complicated by her atrial fibrillation.  We will plan to admit the patient for amiodarone load with plans to cardiovert her  back to sinus rhythm and  will also plan to diurese her.                                                 Charlies Constable, M.D. [facility]    BB/MEDQ  D:  03/09/2002  T:  03/10/2002  Job:  [id number]   cc:   Janae Bridgeman. Eloise Harman., M.D.  [address]  [place]  [place] [address]  Fax: [phone]

## 2010-08-31 NOTE — Discharge Summary (Signed)
NAME:  Judith Zuniga, Judith Zuniga                          ACCOUNT NO.:  [id number]   MEDICAL RECORD NO.:  [id number]                   PATIENT TYPE:  INP   LOCATION:  4733                                 FACILITY:  [facility]   PHYSICIAN:  Charlies Constable, M.D. [facility]              DATE OF BIRTH:  08-04-1920   DATE OF ADMISSION:  03/09/2002  DATE OF DISCHARGE:  03/19/2002                           DISCHARGE SUMMARY - REFERRING   PROCEDURES:  1. Cardiac catheterization.  2. Coronary arteriogram.  3. Left ventriculogram.  4. Graft angiogram.  5. Insertion of dual lead permanent pacemaker (St. Jude).  6. Direct current cardioversion.   HISTORY OF PRESENT ILLNESS:  The patient is a 77 year old female with a  history of atrial fibrillation and cardioversion.  After the cardioversion,  she continued to have shortness of breath and this was relieved with  nitroglycerin.  There was concern that this was secondary to angina and she  had evidence of pulmonary artery pressures as well.  Therefore, it was felt  that she needed admission to the hospital and cardiac catheterization.   HOSPITAL COURSE:  #1 - CORONARY ARTERY DISEASE:  The patient was  catheterized on March 09, 2002, and the cardiac catheterization showed  the left main with 50% and 60% stenoses and an LAD with a 90% mid stenosis.  The ramus intermedius had a 90% stenosis in the circumflex total.  The RCA  had multiple 80% and 90% lesions.  The PLA had a 90% stenosis as well.  The  LIMA to LAD was patent.  The RIMA to the first and second diagonals were  okay, although there were 70% and 90% distal lesions to the anastomosis.  The SVG to acute marginal PLR, PLC, OM2, OM1, as well as the PDA was patent.  She had 2+ MR with inferior hypokinesis and an EF of 55%.  Additionally, her  pulmonary artery pressure was elevated at 28.  It was felt that medical  therapy was the best option for her coronary artery disease with patent  grafts.  The patient was  well controlled on medical therapy.   #2 - CONGESTIVE HEART FAILURE:  This was felt to be multifactorial secondary  to diastolic dysfunction, atrial fibrillation, and mitral regurgitation.  She was diuresed and placed on a daily dose of diuretic, which she had not  been on prior to admission.  Her weight remained stable and her respirations  at baseline.  She is to follow up with a BMET four days after discharge.  She was not discharged on a potassium supplementation, but she was on an ACE  inhibitor and her potassium had been within normal limits.   #3 - ATRIAL FIBRILLATION:  Her rate was controlled and it was felt that she  needed a TEE cardioversion, so this was scheduled.  She was cardioverted  with 150 joules synchronized biphasic energy to a junctional rhythm and then  sinus bradycardia.  The TEE was without complication and there was no  thrombus.  Additionally, she was started on amiodarone.  She had problems  with bradycardia and because of this a permanent pacemaker was inserted.  Her heart rate dropped to a low of 28.  She redeveloped atrial fibrillation.  She was evaluated by Doylene Canning. Ladona Ridgel, M.D., and a St. Jude Identify ADXR  pulse generator was inserted, dual lead in DDD mode, on March 15, 2002.  She was cardioverted at that time as well.   #4 - ANTICOAGULATION:  The patient had been on Coumadin prior to admission  and her INR was therapeutic on 5 mg q.d., except 2.5 mg on Monday and  Thursday.  She was restarted on Coumadin once her pacemaker was reinserted  and her INR climbed to 1.9 by March 19, 2002.  At that point, she was felt  safe for discharge with a single injection of Lovenox prior to discharge.  Additionally, she has an [facility] on [facility].  She  is to be discharged home on 2.5 mg q.d. with a decreased dose secondary to  amiodarone being added to her medication regimen.   DISPOSITION:  With the improvement in her respiratory  status, as well as her  heart rate being under good control and no episodes of chest pain, she was  considered stable for discharge on March 19, 2002, with close follow-up.   LABORATORY DATA:  A BMET is pending at the time of dictation.  Hemoglobin  12.9, hematocrit 38.5, WBC 8.2, platelets 168.  INR 1.9 at discharge.   Chest x-ray:  No pneumothorax, satisfactory placement of dual-lead  pacemaker, stable cardiomegaly.   CONDITION ON DISCHARGE:  Improved.   CONSULTS:  Doylene Canning. Ladona Ridgel, M.D., with electrophysiology.   DISCHARGE DIAGNOSES:  1. Chest pain.  Grafts patent by catheterization this admission with medical     therapy recommended.  2. Atrial fibrillation, status post direct current cardioversion x 2 with     bradycardia on amiodarone.  3. Bradycardia, status post St. Jude Identity dual-lead permanent pacemaker     this admission.  4. Hypertension.  5. Hyperlipidemia.  6. Chronic anticoagulation.  7. Congestive heart failure.  8. Left ventricular dysfunction with an ejection fraction of 40-50%.   ACTIVITY:  Her activity level is to be as tolerated.  She is to increase her  arm movement as described in the discharge instructions.   DIET:  She is to stick to a diet that low in fat, salt, and cholesterol.   FOLLOW-UP:  She is to get an INR at the [facility] on Monday, March 22, 2002, at 11:30 a.m. and also get a BMET that day.  She is to get a wound  check on March 29, 2002, at 9:15 a.m. and follow up with Charlies Constable,  M.D., on April 05, 2002.  She is to follow up with Marcy Salvo C. Lendell Caprice,  M.D., on a p.r.n. basis.   DISCHARGE MEDICATIONS:  1. Amiodarone 200 mg two tablets b.i.d. for one week and then two tablets     q.d.  2. Lasix 40 mg q.d.  3. Coated aspirin 81 mg q.d.  4. Zocor 40 mg q.d.  5. Coumadin half of a 5 mg tablet q.d.  6.     Altace 10 mg q.d.  7. Fosamax 70 mg every week. 8. Lopressor 50 mg one-half tablet b.i.d.     Lavella Hammock, P.A. [facility]  Charlies Constable, M.D. [facility]    RG/MEDQ  D:  03/19/2002  T:  03/19/2002  Job:  [id number]   cc:   Janae Bridgeman. Eloise Harman., M.D.  [address]  [place]  [place] [address]  Fax: [phone]   Shelby Dubin, M.D.

## 2010-08-31 NOTE — Op Note (Signed)
NAME:  Judith Zuniga, Judith Zuniga                          ACCOUNT NO.:  [id number]   MEDICAL RECORD NO.:  [id number]                   PATIENT TYPE:  INP   LOCATION:  2902                                 FACILITY:  [facility]   PHYSICIAN:  Doylene Canning. Ladona Ridgel, M.D. [facility]           DATE OF BIRTH:  1920-06-07   DATE OF PROCEDURE:  DATE OF DISCHARGE:                                 OPERATIVE REPORT   PROCEDURE PERFORMED:  Insertion of a dual chamber pacemaker with DC  cardioversion.   The patient is a very pleasant, 77 year old woman with a history of ischemic  heart disease, stats post bypass surgery who was recently admitted to the  hospital with increasing dyspnea, in atrial fibrillation.  She underwent  catheterization demonstrating patent saphenous vein grafts with negative  three vessel disease and EF of 40%.  She subsequently underwent TE guided  cardioversion by Dr. Tenny Craw.  The day following the procedure, on amiodarone,  the patient developed severe bradycardia with a heart rate in the 20s.  She  subsequently re-developed atrial fibrillation and is now referred for  permanent pacemaker insertion for tachy-brady syndrome.   PROCEDURE:  After informed consent was obtained, the patient was taken to  the diagnostic VP lab in a fasting state.  After the usual preparation and  draping, intravenous Fentanyl and midazolam was given for sedation.  A total  of 30 cc of lidocaine was infiltrated into the left infraclavicular region.  A 6 cm incision was carried out over this region and electrocautery utilized  to dissect down to the subpectoralis fascia.  10 cc of contrast demonstrated  a patent left subclavian vein.  It was subsequently punctured and the St.  Jude, model 1688, 52 cm active fixation lead was placed by way of the left  subclavian vein into the right ventricle.  The ventricular lead serial  number was [id number].  The St. Jude model 1588, 56 cm active fixation lead,  serial number [id number] was  then advanced into the right atrium.  On initial  fluoroscopic evaluation, the patient's heart was rotated markedly towards  the right.  In the RAO projection, the ventricular catheter was placed in  the RV apex.  At this location, the R-wave was measured approximately 20  millivolts and the patient's threshold was 0.5 volts at 0.5 milliseconds  with a pacing impedence of 900 ohms after the lead was actually fixed.  10  volt pacing did not result in diaphragmatic stimulation.  With the  ventricular lead in satisfactory position, attention was turned to the  atrial lead. The patient's underlying atrial rhythm was atrial fibrillation.  At the patient's residual right atrial appendage, the fibrillation waves  were measured between 0.5 and 1 millivolts.  At other sites, the P-waves  were no better.  The lead was actively fixed to this site with AO pacing at  450 ohms once the lead was actively fixed.  At this point, the patient was  more heavily sedated with Fentanyl and versed and underwent DC cardioversion  with a total of 200 synchronized joules restoring sinus rhythm.  At this  point, the patient had very long pauses of over three seconds and a sinus  rate of approximately 20 beats per minute.  P-waves measured between 1 and  1.5 millivolts when they could be obtained.  Pacing was then carried out  from the right atrium, demonstrating an atrial pacing threshold of 0.7 volts  at 0.5 milliseconds with 10 volt pacing again not resulting in diaphragmatic  stimulation.  The patient was maintained at an atrial pacing at a rate of 40  and the leads were secured to the subpectoralis fascia with a figure-of-  eight silk suture.  The sewing sleeves were also secured with silk suture.  Electrocautery was then utilized to make a subcutaneous pocket.  Electrocautery was also used to assure hemostasis.  Gentamycin irrigation  was utilized to irrigate the pocket and the St. Jude Identity DR, model  [id number],  serial [id number], dual chamber pacemaker was connected to the atrial and  ventricular pacing leads, and placed in a subcutaneous pocket.  At this  point, the generator was secured with a silk suture.  Additional gentamicin  was utilized to irrigate the pocket and the incision was closed with a layer  of 2-0 Vicryl followed by a layer of 3-0 Vicryl, followed by a layer of 4-0  Vicryl.  Benzoin was painted on the skin, Steri-Strips were applied and  pressure dressing placed.  The patient was returned to her room in  satisfactory condition.  The patient's underlying pacemaker parameters were  set at DDD pacing at a rate of 70.  The mode switching was left on.  Rate  response was not initially turned on.                                               Doylene Canning. Ladona Ridgel, M.D. [facility]    GWT/MEDQ  D:  03/15/2002  T:  03/15/2002  Job:  [id number]   cc:   Charlies Constable, M.D. [facility]  [address]  [place]  [place] [address]   Janae Bridgeman. Eloise Harman., M.D.  [address]  [place]  [place] [address]  Fax: [phone]   Kathrine Cords, R.N. [facility]  [address]  [place], [place] [address]  Fax: [phone]

## 2010-08-31 NOTE — Assessment & Plan Note (Signed)
[facility]                            CARDIOLOGY OFFICE NOTE   MIKKI, ZIFF                       MRN:          [id number]  DATE:03/24/2006                            DOB:          1920-12-05    REFERRING PHYSICIAN:  Janae Bridgeman. Eloise Harman., M.D.   CLINICAL HISTORY:  Judith Zuniga is 77 years old, and has sick sinus  syndrome with atrial fibrillation controlled on amiodarone, and recently  had a Victory [id number] DDD generator change.  Her previous generator lasted  only 3 years, although her lead function is good.   Ms. Reach also has a history of previous bypass surgery and previous  congestive heart failure related to atrial fibrillation, mitral  regurgitation and diastolic dysfunction.  Her ejection fraction is  estimated at 40% to 45%.   She said she has been doing quite well recently, and has had no recent  chest pain, shortness of breath or palpitations.  However, she fell  about 5 weeks ago and hit her head and right arm, and still has  ecchymoses related to this, although there was no serious injury.   PAST MEDICAL HISTORY:  Significant for hypertension and hyperlipidemia.   CURRENT MEDICATIONS:  Includes metoprolol, Coumadin, aspirin, Actonel,  amiodarone, Lasix, Altace and Vytorin.   EXAMINATION:  Her blood pressure was 155/74 and the pulse 62 and  regular.  There was no venous distention.  The carotid pulses were full without  bruits.  CHEST:  Clear.  CARDIAC:  Rhythm was regular.  I could hear no murmurs or gallops.  ABDOMEN:  Soft without organomegaly.  Peripherals were full.  There was no peripheral edema.  She did have ecchymoses on her right cheek and her right arm and leg.   We interrogated her pacemaker.  Underlying rhythm was sinus rhythm with  left bundle branch block.  She had good thresholds on both leads.  She  is not pacing the ventricle at all since __________ program at 350.  She  is pacing the atrium about  40% of the time.   IMPRESSION:  1. Coronary artery disease status post previous bypass surgery in 1988      with patent grafts in January 2007.  2. Ischemic cardiomyopathy with ejection fraction of 40% to 45% and 2+      mitral regurgitation.  3. Sick sinus syndrome with paroxysmal atrial fibrillation controlled      on amiodarone and Coumadin therapy.  4. Status post recent generator change with Victory XL DR [id number] [employer]ude DD pacemaker with good pacer function.  5. History of congestive heart failure related to diastolic      dysfunction, mitral regurgitation and atrial fibrillation, now      compensated.  6. Hypertension.  7. Hyperlipidemia with recent excellent lipid profile.   RECOMMENDATIONS:  I think [name] is doing extremely well from a  cardiac standpoint.  We will not make any changes in her medications.  I  will get a CBC and BMP today.  We will plan to see her back in 6  months.  We will get an echocardiogram prior to her followup visit since it has  been some time since she has had this done.     Bruce Elvera Lennox Juanda Chance, MD, [facility]  Electronically Signed    BRB/MedQ  DD: 03/24/2006  DT: 03/24/2006  Job #: [id number]

## 2010-08-31 NOTE — Cardiovascular Report (Signed)
NAME:  Judith Zuniga, Judith Zuniga                ACCOUNT NO.:  [id number]   MEDICAL RECORD NO.:  [id number]          PATIENT TYPE:  OIB   LOCATION:  1961                         FACILITY:  [facility]   PHYSICIAN:  Charlies Constable, M.D. [facility] DATE OF BIRTH:  Aug 19, 1920   DATE OF PROCEDURE:  02/13/2005  DATE OF DISCHARGE:                              CARDIAC CATHETERIZATION   CLINICAL HISTORY:  Judith Zuniga is 77 years old and had redo bypass surgery  in 1998.  She has a history of atrial fibrillation, congestive heart  failure, related to diastolic dysfunction and mitral regurgitation.  She has  a St. Jude Identify DDV pacemaker implanted.  She recently had an  echocardiogram which showed a falling ejection fraction from 55% to 30-40%.  She also was felt to have moderately severe mitral regurgitation on her  echocardiogram.  She has not been having any new symptoms of shortness of  breath or chest pain, but because of this change, we elected to bring her in  for evaluation with angiography.   PROCEDURE:  The procedure was performed via the right femoral artery using  arterial sheath and 4 French preformed coronary catheters.  We used a LIMA  catheter to inject the LIMA graft.  We used the right bypass graft catheter  for injection of the vein graft to the right coronary artery.  We attempted  to access the right femoral vein, both on the right side and the left side,  including use with a Doppler needle.  We obtained some mild blood flow and  did an injection to the vein on the right side and the vein appeared to be  occluded at this site.  We finally abandoned further attempts at completing  the right heart catheterization.  The patient tolerated the procedure well  and left the laboratory in satisfactory condition.   RESULTS:  Left main coronary artery had an 80% ostial stenosis.  There was  no dampening of the pressure because the pressure was equalized with the  RIMA graft to the diagonal branch of the  LAD.   Left anterior descending artery was completely occluded after a moderately  large diagonal branch.  There was competing flow with the LIMA graft in the  diagonal branch.   Circumflex artery was completely occluded in its origin.   The right coronary artery was a large vessel that gave rise to a posterior  descending branch and then was completely occluded.  There was 80% proximal  and 80% distal stenosis in the right coronary artery and 90% stenosis in the  ostium of the posterior descending branch.  There is competing flow distally  in the posterior descending branch.   The sequential vein graft to the posterior descending and posterolateral  branch of the right coronary artery and posterolateral branch of the  circumflex was patent and functioned normally.  This is a Y graft with one  end of the Y connected to the posterior descending branch and the other Y  connecting to the two posterolateral branches.  The posterolateral branch in  the circumflex artery was occluded after the insertion site,  but this vessel  filled proximally and filled another posterolateral branch.   The free RIMA graft to the diagonal branch of the LAD was patent and  functioned normally.  The diagonal branch gave rise to two sub-branches, one  of which had a 70% stenosis.   The LIMA graft to the mid LAD was patent and functioned normally.   The left ventriculogram performed in the RAO projection showed mild global  hypokinesis with an estimated ejection fraction 40-45%.  There was 2+ mild  to moderate mitral regurgitation only.   The aortic pressure was 144/62 with a mean of 96.   CONCLUSION:  1.  Coronary artery disease status post prior redo coronary artery bypass      surgery in 1998.  2.  Severe native vessel coronary disease with 80% stenosis in the ostium of      the left main, total occlusion of the left anterior descending and      circumflex arteries, and multiple 80% and 90% stenoses  in the right      coronary artery with total occlusion of the distal right coronary artery      after the posterior descending branch.  3.  Patent sequential vein graft to the posterior descending and      posterolateral branch of the right coronary artery and posterolateral      branch of the circumflex artery with occlusion of the posterolateral      branch of the circumflex artery after the insertion site, patent free      RIMA graft to the diagonal branch of the LAD with 70% stenosis distal to      the insertion site in the diagonal branch, and a patent LIMA graft to      the LAD.  4.  Mild global hypokinesis with an estimated ejection fraction of 40-45%      and 2+ mild to moderate mitral regurgitation.   RECOMMENDATIONS:  All the grafts are patent and working well.  The decrease  in LV function does not appear as much as assessed by echocardiography.  We  are unable to measure right heart pressures due to the difficulty with  access, but the LVEDP was 20 and the mitral regurgitation was only mild to  moderate, at most, so I think this information is not critical.  We will  plan reassurance and continue medical management.  Once she returns for a  follow up visit, we will evaluate her pacer and see if we can have her use  her intrinsic rhythm if possible.  We will also arrange to have venous  Dopplers to rule out deep vein thrombophlebitis.           ______________________________  Charlies Constable, M.D. [facility]     BB/MEDQ  D:  02/13/2005  T:  02/13/2005  Job:  [id number]   cc:   Janae Bridgeman. Eloise Harman., M.D.  Fax: [phone]

## 2010-08-31 NOTE — Cardiovascular Report (Signed)
NAME:  Judith Zuniga, Judith Zuniga                ACCOUNT NO.:  [id number]   MEDICAL RECORD NO.:  [id number]          PATIENT TYPE:  OIB   LOCATION:  2853                         FACILITY:  [facility]   PHYSICIAN:  Charlies Constable, M.D. [facility] DATE OF BIRTH:  [birth year]   DATE OF PROCEDURE:  08/20/2005  DATE OF DISCHARGE:  08/20/2005                              CARDIAC CATHETERIZATION   HISTORY:  [name] is 77 years old and has had previous bypass surgery  and has had previous implantation with Identity DDD pacemaker for sick sinus  syndrome.  She also has had a history of atrial fibrillation and congestive  heart failure related to diastolic dysfunction and moderate mitral  regurgitation.  She recently reached the ERI and was brought in for  generator change.  Coumadin was held prior to her admission.   PROCEDURE:  Explantation of the old Identity ADx DR pacemaker (model [id number],  serial number [id number]), inspection of the old atrial lead (model [id number],  serial number [id number], date of implant March 15, 2002) and inspection of  the old ventricular lead (model [id number], serial number [id number], date of  implant March 15, 2002), and implantation of a new Victory XL DR DDDR  pacemaker (model number [id number], serial number [id number]).   INDICATIONS:  ERI of the old pulse generator and sick sinus syndrome.   ANESTHESIA:  One percent local Xylocaine.   ESTIMATED BLOOD LOSS:  Less than 10 cc.   COMPLICATIONS:  None.   PROCEDURE NOTE:  The procedure was performed in laboratory room #3.  The  left anterior chest was prepped and draped in usual fashion.  The skin was  anesthetized with 1% local Xylocaine.  An incision was made just below the  old incision and extended to the pocket.  The pocket was opened and the  generous removed.  The leads were inspected with good parameters described  below.  The pocket was irrigated with sterile kanamycin solution.  The leads  were detached from the old generator and attached to the  new generator.  The  new generator was implanted into the pocket.  The subcutaneous tissue was  closed with running 2-0 Vicryl.  The skin was closed with running 4-0  Vicryl.  The patient tolerated the procedure well and left the laboratory in  satisfactory condition.   Atrial lead:  P-wave 0.9-1.2 mV.  __________ threshold capture 0.4 volts and  resistance 126 ohms.   Ventricular lead:  R-wave 13.6 mV.  _________ threshold capture 0.7 volts  and resistance 175 ohms.   The patient tolerated the procedure well and left the laboratory in  satisfactory condition.           ______________________________  Charlies Constable, M.D. [facility]     BB/MEDQ  D:  08/20/2005  T:  08/21/2005  Job:  [id number]

## 2010-09-17 ENCOUNTER — Encounter: Payer: Self-pay | Admitting: Cardiology

## 2010-09-18 ENCOUNTER — Emergency Department (HOSPITAL_COMMUNITY): Payer: Medicare Other

## 2010-09-18 ENCOUNTER — Inpatient Hospital Stay (INDEPENDENT_AMBULATORY_CARE_PROVIDER_SITE_OTHER)
Admission: RE | Admit: 2010-09-18 | Discharge: 2010-09-18 | Disposition: A | Discharge status: Home / Self Care | Payer: Medicare Other | Source: Ambulatory Visit | Attending: Emergency Medicine | Admitting: Emergency Medicine

## 2010-09-18 ENCOUNTER — Emergency Department (HOSPITAL_COMMUNITY)
Admission: EM | Admit: 2010-09-18 | Discharge: 2010-09-19 | Disposition: A | Discharge status: Home / Self Care | Payer: Medicare Other | Attending: Emergency Medicine | Admitting: Emergency Medicine

## 2010-09-18 ENCOUNTER — Telehealth: Payer: Self-pay | Admitting: Cardiology

## 2010-09-18 DIAGNOSIS — E119 Type 2 diabetes mellitus without complications: Secondary | ICD-10-CM | POA: Insufficient documentation

## 2010-09-18 DIAGNOSIS — S40029A Contusion of unspecified upper arm, initial encounter: Secondary | ICD-10-CM | POA: Insufficient documentation

## 2010-09-18 DIAGNOSIS — S0990XA Unspecified injury of head, initial encounter: Secondary | ICD-10-CM

## 2010-09-18 DIAGNOSIS — Z79899 Other long term (current) drug therapy: Secondary | ICD-10-CM | POA: Insufficient documentation

## 2010-09-18 DIAGNOSIS — I499 Cardiac arrhythmia, unspecified: Secondary | ICD-10-CM | POA: Insufficient documentation

## 2010-09-18 DIAGNOSIS — Y92009 Unspecified place in unspecified non-institutional (private) residence as the place of occurrence of the external cause: Secondary | ICD-10-CM | POA: Insufficient documentation

## 2010-09-18 DIAGNOSIS — I10 Essential (primary) hypertension: Secondary | ICD-10-CM | POA: Insufficient documentation

## 2010-09-18 DIAGNOSIS — Y998 Other external cause status: Secondary | ICD-10-CM | POA: Insufficient documentation

## 2010-09-18 DIAGNOSIS — IMO0002 Reserved for concepts with insufficient information to code with codable children: Secondary | ICD-10-CM | POA: Insufficient documentation

## 2010-09-18 DIAGNOSIS — Z950 Presence of cardiac pacemaker: Secondary | ICD-10-CM | POA: Insufficient documentation

## 2010-09-18 DIAGNOSIS — S0083XA Contusion of other part of head, initial encounter: Secondary | ICD-10-CM | POA: Insufficient documentation

## 2010-09-18 DIAGNOSIS — E785 Hyperlipidemia, unspecified: Secondary | ICD-10-CM | POA: Insufficient documentation

## 2010-09-18 DIAGNOSIS — S0003XA Contusion of scalp, initial encounter: Secondary | ICD-10-CM | POA: Insufficient documentation

## 2010-09-18 DIAGNOSIS — W19XXXA Unspecified fall, initial encounter: Secondary | ICD-10-CM | POA: Insufficient documentation

## 2010-09-18 DIAGNOSIS — Z7901 Long term (current) use of anticoagulants: Secondary | ICD-10-CM | POA: Insufficient documentation

## 2010-09-18 LAB — POCT I-STAT, CHEM 8
BUN: 21 mg/dL (ref 6–23)
Calcium, Ion: 1.17 mmol/L (ref 1.12–1.32)
Creatinine, Ser: 1.2 mg/dL (ref 0.4–1.2)
Hemoglobin: 11.6 g/dL — ABNORMAL LOW (ref 12.0–15.0)
TCO2: 25 mmol/L (ref 0–100)

## 2010-09-18 NOTE — Telephone Encounter (Signed)
LMOM for pt that if she fell while on coumadin or not she should always be evaluated. Being on coumadin increases her risk of internal bleeding and we always suggested follow with PCP, ER or urgent care. Encouraged on voicemail to seek medical attention immediately since her message states she has bruising on forehead and around her eyes and call us after seeking medical attention and we will also follow up with her. TM

## 2010-09-18 NOTE — Telephone Encounter (Signed)
Pt fell Saturday night and she has bruise on fore head and around eyes.  Does she need to come in for PT check?

## 2010-09-18 NOTE — Telephone Encounter (Signed)
Pt telephoned back after hearing my message. She states she saw  Dr Kellie Simmering today and he wants her to come back on Thursday. He also wants her to get her eyes evaluated due to the bruising around her eyes.  She states that she had been taking Neurotin and that made her very dizzy and she fell at home on her face, faced down on Saturday. She states she is very dizzy now and she has a knot on her forehead. After discussing this with CVRR clinic staff we decided that the pt needed to go to ER. If she has nobody to drive her she needs to call EMS and report immediately to ER. She agreed because she states she is nervous about how she is feeling.

## 2010-09-26 ENCOUNTER — Encounter: Payer: Medicare Other | Admitting: Cardiology

## 2010-09-26 ENCOUNTER — Encounter: Payer: Medicare Other | Admitting: *Deleted

## 2010-10-08 ENCOUNTER — Ambulatory Visit (INDEPENDENT_AMBULATORY_CARE_PROVIDER_SITE_OTHER): Payer: Medicare Other | Admitting: Cardiology

## 2010-10-08 ENCOUNTER — Ambulatory Visit (INDEPENDENT_AMBULATORY_CARE_PROVIDER_SITE_OTHER): Payer: Medicare Other | Admitting: *Deleted

## 2010-10-08 ENCOUNTER — Encounter: Payer: Self-pay | Admitting: Cardiology

## 2010-10-08 DIAGNOSIS — I509 Heart failure, unspecified: Secondary | ICD-10-CM

## 2010-10-08 DIAGNOSIS — Z79899 Other long term (current) drug therapy: Secondary | ICD-10-CM

## 2010-10-08 DIAGNOSIS — I340 Nonrheumatic mitral (valve) insufficiency: Secondary | ICD-10-CM

## 2010-10-08 DIAGNOSIS — E785 Hyperlipidemia, unspecified: Secondary | ICD-10-CM

## 2010-10-08 DIAGNOSIS — I5032 Chronic diastolic (congestive) heart failure: Secondary | ICD-10-CM

## 2010-10-08 DIAGNOSIS — I4891 Unspecified atrial fibrillation: Secondary | ICD-10-CM

## 2010-10-08 DIAGNOSIS — Z7901 Long term (current) use of anticoagulants: Secondary | ICD-10-CM

## 2010-10-08 DIAGNOSIS — I25810 Atherosclerosis of coronary artery bypass graft(s) without angina pectoris: Secondary | ICD-10-CM

## 2010-10-08 DIAGNOSIS — I5033 Acute on chronic diastolic (congestive) heart failure: Secondary | ICD-10-CM | POA: Insufficient documentation

## 2010-10-08 DIAGNOSIS — I059 Rheumatic mitral valve disease, unspecified: Secondary | ICD-10-CM

## 2010-10-08 LAB — LIPID PANEL
LDL Cholesterol: 98 mg/dL (ref 0–99)
Total CHOL/HDL Ratio: 3
VLDL: 16 mg/dL (ref 0.0–40.0)

## 2010-10-08 LAB — BASIC METABOLIC PANEL
BUN: 26 mg/dL — ABNORMAL HIGH (ref 6–23)
CO2: 26 mEq/L (ref 19–32)
Chloride: 108 mEq/L (ref 96–112)
Creatinine, Ser: 1 mg/dL (ref 0.4–1.2)
Glucose, Bld: 95 mg/dL (ref 70–99)
Potassium: 4.4 mEq/L (ref 3.5–5.1)

## 2010-10-08 LAB — HEPATIC FUNCTION PANEL
Bilirubin, Direct: 0.2 mg/dL (ref 0.0–0.3)
Total Bilirubin: 0.5 mg/dL (ref 0.3–1.2)
Total Protein: 6.2 g/dL (ref 6.0–8.3)

## 2010-10-08 MED ORDER — SIMVASTATIN 40 MG PO TABS: 40.0000 mg | ORAL_TABLET | Freq: Every evening | ORAL | Status: DC

## 2010-10-08 MED ORDER — WARFARIN SODIUM 5 MG PO TABS: 5.0000 mg | ORAL_TABLET | ORAL | Status: DC

## 2010-10-08 MED ORDER — FUROSEMIDE 20 MG PO TABS: 20.0000 mg | ORAL_TABLET | Freq: Every day | ORAL | Status: DC

## 2010-10-08 NOTE — Assessment & Plan Note (Signed)
Last EF was 55%.  However, patient still has severe mitral regurgitation and does appear at least mildly volume overloaded today.  She needs to take Lasix 20 mg daily regularly. Will check BNP.

## 2010-10-08 NOTE — Assessment & Plan Note (Signed)
Check lipids/LFTs, goal LDL < 70.  

## 2010-10-08 NOTE — Progress Notes (Signed)
PCP: Dr. Selena Batten  77 yo with a history of atrial fibrillation s/p AV nodal ablation, CAD s/p CABG, and ischemic cardiomyopathy presents for cardiology followup.  She has been seen by Dr. Juanda Chance in the past and is seen by me for the first time today.  She had initial CABG in 1985 with redo in 1998. She developed an ischemic cardiomyopathy with EF 25%.  However, with biventricular pacing, her EF had improved to 55% on last echo. That echo did show severe mitral regurgitation.    Patient lives alone. She has been stable symptomatically. She vacuums and mops in her house with no exertional dyspnea.  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  No chest pain. She has not been taking Lasix regularly.  2-3 weeks ago, she started gabapentin for peripheral neuropathy. This made her feel "drunk" and unsteady.  She fell two weeks ago.  After stopping gabapentin, these symptoms resolved.    Labs (12/11): K 4.7, creatinine 1.4, HCT 31.6  PMH: 1. Atrial fibrillation: s/p AV nodal ablation 2. Ischemic CMP: EF as low as 25%.  After CRT, EF improved.  Last echo (7/11): EF 55%, severe central MR, severe TR, moderate biatrial enlargement.  3.  St. ude CRT device 4. CAD: s/p CABG 1985 with LIMA-LAD, SVG-PDA, SVG-PLV. Redo CABG 1998 with RIMA-D1 and D2, SVG-PDA, SVG-PLV, SVG-CFX.   5. Hyperlipidemia 6. Peripheral neuropathy.  7. Mitral regurgitation: Severe by 7/11 echo.  Possibly due to annular dilatation.   SH: Lives alone in [place]. Retired.  Nonsmoker, no ETOH.    FH: Mother died at 62, h/o MI.  Father with probable sudden cardiac death at 57.   ROS: All systems reviewed and negative except as per HPI.   Current Outpatient Prescriptions  Medication Sig Dispense Refill  . aspirin 81 MG tablet Take 81 mg by mouth daily.        . carvedilol (COREG) 25 MG tablet Take 25 mg by mouth 2 (two) times daily.        . furosemide (LASIX) 40 MG tablet Take 20 mg by mouth daily.        Marland Kitchen glimepiride (AMARYL) 2 MG tablet Take 1 mg by  mouth daily.        Marland Kitchen losartan (COZAAR) 100 MG tablet Take 0.5 tablets (50 mg total) by mouth daily.  15 tablet  9  . simvastatin (ZOCOR) 40 MG tablet Take 1 tablet (40 mg total) by mouth every evening.  30 tablet  9  . warfarin (COUMADIN) 5 MG tablet Take 1 tablet (5 mg total) by mouth as directed.  90 tablet  1  . DISCONTD: simvastatin (ZOCOR) 40 MG tablet Take 1 tablet (40 mg total) by mouth every evening.  30 tablet  9  . DISCONTD: warfarin (COUMADIN) 5 MG tablet Take by mouth as directed.        . furosemide (LASIX) 20 MG tablet Take 1 tablet (20 mg total) by mouth daily.  30 tablet  11    BP 136/62  Pulse 62  Resp 16  Ht 5\' 3"  (1.6 m)  Wt 128 lb (58.06 kg)  BMI 22.67 kg/m2 General: NAD, elderly Neck: JVP 8-9 cm, no thyromegaly or thyroid nodule.  Lungs: Clear to auscultation bilaterally with normal respiratory effort. CV: Nondisplaced PMI.  Heart regular S1/S2, no S3/S4, 2/6 HSM at apex.  1+ ankle edema.  No carotid bruit.  Normal pedal pulses.  Abdomen: Soft, nontender, no hepatosplenomegaly, no distention.  Neurologic: Alert and oriented x 3.  Psych: Normal affect. Extremities: No clubbing or cyanosis.

## 2010-10-08 NOTE — Patient Instructions (Signed)
Your physician recommends that you schedule a follow-up appointment in: 3 MONTHS WITH DR [facility]  Your physician has recommended you make the following change in your medication: ADD FUROSEMIDE 20 MG 1 EVERY DAY  Your physician recommends that you return for lab work in: TODAY BMET LIPID LIVER DX 272.4 V58.69  428.32 424.0

## 2010-10-08 NOTE — Assessment & Plan Note (Signed)
Severe on last echo.  At 77 years old with 2 prior sternotomies, she will not be a candidate for repeat heart surgery. Will manage CHF medically. Continue losartan for afterload reduction.

## 2010-10-08 NOTE — Assessment & Plan Note (Signed)
Stable s/p CABG and redo CABG with no ischemic symptoms.  Continue ASA, Coreg, ARB, and statin.

## 2010-10-08 NOTE — Assessment & Plan Note (Signed)
S/p AV nodal ablation with BIV-pacing.  Continue coumadin.

## 2010-10-12 ENCOUNTER — Telehealth: Payer: Self-pay | Admitting: *Deleted

## 2010-10-12 DIAGNOSIS — E785 Hyperlipidemia, unspecified: Secondary | ICD-10-CM

## 2010-10-12 DIAGNOSIS — I25810 Atherosclerosis of coronary artery bypass graft(s) without angina pectoris: Secondary | ICD-10-CM

## 2010-10-12 MED ORDER — ATORVASTATIN CALCIUM 40 MG PO TABS: 40.0000 mg | ORAL_TABLET | Freq: Every day | ORAL | Status: DC

## 2010-10-12 NOTE — Telephone Encounter (Signed)
Message copied by Jacqlyn Krauss on Fri Oct 12, 2010  2:17 PM ------      Message from: Laurey Morale      Created: Thu Oct 11, 2010  2:09 PM       LDL is above goal (< 70).  Would stop simvastatin and start atorvastatin 40 mg daily with lipids/LFTs in 2 months.

## 2010-10-12 NOTE — Telephone Encounter (Signed)
Notes Recorded by Jacqlyn Krauss, RN on 10/12/2010 at 2:16 PM I discussed results with pt. She will stop simvastatin and start atorvastatin 40mg  daily. She will return for fasting lipid/liver profile 12/13/10. Notes Recorded by Marca Ancona, MD on 10/11/2010 at 2:09 PM LDL is above goal (< 70). Would stop simvastatin and start atorvastatin 40 mg daily with lipids/LFTs in 2 months

## 2010-11-02 ENCOUNTER — Other Ambulatory Visit: Payer: Self-pay | Admitting: Internal Medicine

## 2010-11-02 DIAGNOSIS — I6529 Occlusion and stenosis of unspecified carotid artery: Secondary | ICD-10-CM

## 2010-11-05 ENCOUNTER — Encounter (INDEPENDENT_AMBULATORY_CARE_PROVIDER_SITE_OTHER): Payer: Medicare Other | Admitting: *Deleted

## 2010-11-05 ENCOUNTER — Ambulatory Visit (INDEPENDENT_AMBULATORY_CARE_PROVIDER_SITE_OTHER): Payer: Medicare Other | Admitting: *Deleted

## 2010-11-05 DIAGNOSIS — Z7901 Long term (current) use of anticoagulants: Secondary | ICD-10-CM

## 2010-11-05 DIAGNOSIS — I6529 Occlusion and stenosis of unspecified carotid artery: Secondary | ICD-10-CM

## 2010-11-05 DIAGNOSIS — I4891 Unspecified atrial fibrillation: Secondary | ICD-10-CM

## 2010-11-07 ENCOUNTER — Encounter: Payer: Self-pay | Admitting: Internal Medicine

## 2010-11-16 ENCOUNTER — Telehealth: Payer: Self-pay | Admitting: Cardiology

## 2010-11-16 NOTE — Telephone Encounter (Signed)
[employer] is calling the patient again to find out if she wants them to check her device

## 2010-11-17 ENCOUNTER — Inpatient Hospital Stay (INDEPENDENT_AMBULATORY_CARE_PROVIDER_SITE_OTHER)
Admission: RE | Admit: 2010-11-17 | Discharge: 2010-11-17 | Disposition: A | Discharge status: Home / Self Care | Payer: Medicare Other | Source: Ambulatory Visit | Attending: Emergency Medicine | Admitting: Emergency Medicine

## 2010-11-17 DIAGNOSIS — R040 Epistaxis: Secondary | ICD-10-CM

## 2010-11-17 LAB — PROTIME-INR
INR: 2.47 — ABNORMAL HIGH (ref 0.00–1.49)
Prothrombin Time: 27.2 seconds — ABNORMAL HIGH (ref 11.6–15.2)

## 2010-11-19 NOTE — Telephone Encounter (Signed)
Left message for patient.  She is to call [employer] and set up a time to do her TTM.

## 2010-12-03 ENCOUNTER — Ambulatory Visit (INDEPENDENT_AMBULATORY_CARE_PROVIDER_SITE_OTHER): Payer: Medicare Other | Admitting: *Deleted

## 2010-12-03 DIAGNOSIS — Z7901 Long term (current) use of anticoagulants: Secondary | ICD-10-CM

## 2010-12-03 DIAGNOSIS — I4891 Unspecified atrial fibrillation: Secondary | ICD-10-CM

## 2010-12-03 LAB — POCT INR: INR: 2.1

## 2010-12-13 ENCOUNTER — Telehealth: Payer: Self-pay

## 2010-12-13 ENCOUNTER — Other Ambulatory Visit (INDEPENDENT_AMBULATORY_CARE_PROVIDER_SITE_OTHER): Payer: Medicare Other | Admitting: *Deleted

## 2010-12-13 DIAGNOSIS — E785 Hyperlipidemia, unspecified: Secondary | ICD-10-CM

## 2010-12-13 DIAGNOSIS — I25810 Atherosclerosis of coronary artery bypass graft(s) without angina pectoris: Secondary | ICD-10-CM

## 2010-12-13 LAB — LIPID PANEL
Cholesterol: 142 mg/dL (ref 0–200)
Total CHOL/HDL Ratio: 3
Triglycerides: 76 mg/dL (ref 0.0–149.0)

## 2010-12-13 LAB — HEPATIC FUNCTION PANEL
ALT: 19 U/L (ref 0–35)
AST: 28 U/L (ref 0–37)
Albumin: 3.7 g/dL (ref 3.5–5.2)
Alkaline Phosphatase: 48 U/L (ref 39–117)

## 2010-12-13 NOTE — Progress Notes (Signed)
Quick Note:  RN Preliminarily reviewed. Forwarded to MD desktop for review and signature ______ 

## 2010-12-13 NOTE — Telephone Encounter (Signed)
Pt came in requesting phone number for [facility].  She states she received a call from them and wanted to return it.  She was provided with the phone number.

## 2010-12-31 ENCOUNTER — Ambulatory Visit (INDEPENDENT_AMBULATORY_CARE_PROVIDER_SITE_OTHER): Payer: Medicare Other | Admitting: *Deleted

## 2010-12-31 DIAGNOSIS — I4891 Unspecified atrial fibrillation: Secondary | ICD-10-CM

## 2010-12-31 DIAGNOSIS — Z7901 Long term (current) use of anticoagulants: Secondary | ICD-10-CM

## 2011-01-03 LAB — CBC
HCT: 34.3 — ABNORMAL LOW
Hemoglobin: 11.6 — ABNORMAL LOW
MCV: 92.3
Platelets: 118 — ABNORMAL LOW
RDW: 14.2
WBC: 6.7

## 2011-01-03 LAB — POCT I-STAT 3, VENOUS BLOOD GAS (G3P V)
Acid-base deficit: 2
Bicarbonate: 23.2
O2 Saturation: 57
pO2, Ven: 31

## 2011-01-03 LAB — BASIC METABOLIC PANEL
BUN: 18
BUN: 30 — ABNORMAL HIGH
BUN: 32 — ABNORMAL HIGH
CO2: 28
CO2: 30
Calcium: 8.3 — ABNORMAL LOW
Calcium: 8.3 — ABNORMAL LOW
Calcium: 8.4
Calcium: 8.8
Creatinine, Ser: 1.23 — ABNORMAL HIGH
Creatinine, Ser: 1.45 — ABNORMAL HIGH
Creatinine, Ser: 1.49 — ABNORMAL HIGH
Creatinine, Ser: 1.68 — ABNORMAL HIGH
GFR calc Af Amer: 39 — ABNORMAL LOW
GFR calc Af Amer: 40 — ABNORMAL LOW
GFR calc Af Amer: 50 — ABNORMAL LOW
GFR calc non Af Amer: 29 — ABNORMAL LOW
GFR calc non Af Amer: 33 — ABNORMAL LOW
GFR calc non Af Amer: 34 — ABNORMAL LOW
Glucose, Bld: 104 — ABNORMAL HIGH
Glucose, Bld: 95
Glucose, Bld: 97
Potassium: 4.1
Sodium: 139

## 2011-01-03 LAB — POCT I-STAT 3, ART BLOOD GAS (G3+)
Bicarbonate: 24.3 — ABNORMAL HIGH
Operator id: 274661
pH, Arterial: 7.443 — ABNORMAL HIGH
pO2, Arterial: 78 — ABNORMAL LOW

## 2011-01-03 LAB — PROTIME-INR: INR: 1.3

## 2011-01-09 ENCOUNTER — Encounter: Payer: Self-pay | Admitting: Cardiology

## 2011-01-09 ENCOUNTER — Ambulatory Visit (INDEPENDENT_AMBULATORY_CARE_PROVIDER_SITE_OTHER): Payer: Medicare Other | Admitting: Cardiology

## 2011-01-09 DIAGNOSIS — I4891 Unspecified atrial fibrillation: Secondary | ICD-10-CM

## 2011-01-09 DIAGNOSIS — I25810 Atherosclerosis of coronary artery bypass graft(s) without angina pectoris: Secondary | ICD-10-CM

## 2011-01-09 DIAGNOSIS — R0602 Shortness of breath: Secondary | ICD-10-CM

## 2011-01-09 DIAGNOSIS — I340 Nonrheumatic mitral (valve) insufficiency: Secondary | ICD-10-CM

## 2011-01-09 DIAGNOSIS — I5022 Chronic systolic (congestive) heart failure: Secondary | ICD-10-CM

## 2011-01-09 DIAGNOSIS — I509 Heart failure, unspecified: Secondary | ICD-10-CM

## 2011-01-09 DIAGNOSIS — I059 Rheumatic mitral valve disease, unspecified: Secondary | ICD-10-CM

## 2011-01-09 DIAGNOSIS — E785 Hyperlipidemia, unspecified: Secondary | ICD-10-CM

## 2011-01-09 DIAGNOSIS — I5032 Chronic diastolic (congestive) heart failure: Secondary | ICD-10-CM

## 2011-01-09 LAB — BASIC METABOLIC PANEL
CO2: 27 mEq/L (ref 19–32)
Chloride: 106 mEq/L (ref 96–112)
Glucose, Bld: 77 mg/dL (ref 70–99)
Potassium: 4 mEq/L (ref 3.5–5.1)
Sodium: 142 mEq/L (ref 135–145)

## 2011-01-09 NOTE — Patient Instructions (Signed)
Your physician recommends that you have lab work today--BMP/BNP 428.22  Your physician wants you to follow-up in: 4 months with Dr Shirlee Latch.( January 2013). You will receive a reminder letter in the mail two months in advance. If you don't receive a letter, please call our office to schedule the follow-up appointment.

## 2011-01-10 NOTE — Assessment & Plan Note (Signed)
Stable s/p CABG and redo CABG with no ischemic symptoms.  Continue ASA, Coreg, ARB, and statin.

## 2011-01-10 NOTE — Assessment & Plan Note (Signed)
Severe on last echo.  At 77 years old with 2 prior sternotomies, she will not be a candidate for repeat heart surgery. Will manage CHF medically. Continue losartan for afterload reduction.

## 2011-01-10 NOTE — Assessment & Plan Note (Signed)
Lipids near goal when recently checked (goal LDL < 70).

## 2011-01-10 NOTE — Progress Notes (Signed)
PCP: Dr. Selena Batten  77 yo with a history of atrial fibrillation s/p AV nodal ablation, CAD s/p CABG, and ischemic cardiomyopathy presents for cardiology followup.  She had initial CABG in 1985 with redo in 1998. She developed an ischemic cardiomyopathy with EF 25%.  However, with biventricular pacing, her EF had improved to 55% on last echo. That echo did show severe mitral regurgitation.    Patient lives alone. She has been stable symptomatically. She vacuums and mops in her house with no exertional dyspnea.  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  No chest pain. She is now taking Lasix regularly and lower extremity edema seems to have resolved.    Labs (12/11): K 4.7, creatinine 1.4, HCT 31.6 Labs (8/12): LDL 75, HDL 52  PMH: 1. Atrial fibrillation: s/p AV nodal ablation 2. Ischemic CMP: EF as low as 25%.  After CRT, EF improved.  Last echo (7/11): EF 55%, severe central MR, severe TR, moderate biatrial enlargement.  3.  St. Jude CRT device 4. CAD: s/p CABG 1985 with LIMA-LAD, SVG-PDA, SVG-PLV. Redo CABG 1998 with RIMA-D1 and D2, SVG-PDA, SVG-PLV, SVG-CFX.   5. Hyperlipidemia 6. Peripheral neuropathy.  7. Mitral regurgitation: Severe by 7/11 echo.  Possibly due to annular dilatation.   SH: Lives alone in [place]. Retired.  Nonsmoker, no ETOH.    FH: Mother died at 16, h/o MI.  Father with probable sudden cardiac death at 103.    Current Outpatient Prescriptions  Medication Sig Dispense Refill  . aspirin 81 MG tablet Take 81 mg by mouth daily.        Marland Kitchen atorvastatin (LIPITOR) 40 MG tablet Take 1 tablet (40 mg total) by mouth daily.  30 tablet  3  . carvedilol (COREG) 25 MG tablet Take 25 mg by mouth 2 (two) times daily.        . furosemide (LASIX) 20 MG tablet Take 1 tablet (20 mg total) by mouth daily.  30 tablet  11  . glimepiride (AMARYL) 2 MG tablet Take 1 mg by mouth daily.        Marland Kitchen losartan (COZAAR) 100 MG tablet Take 0.5 tablets (50 mg total) by mouth daily.  15 tablet  9  . warfarin  (COUMADIN) 5 MG tablet Take 1 tablet (5 mg total) by mouth as directed.  90 tablet  1    BP 136/77  Pulse 82  Ht 5\' 2"  (1.575 m)  Wt 127 lb (57.607 kg)  BMI 23.23 kg/m2 General: NAD, elderly Neck: JVP 7 cm, no thyromegaly or thyroid nodule.  Lungs: Clear to auscultation bilaterally with normal respiratory effort. CV: Nondisplaced PMI.  Heart regular S1/S2, no S3/S4, 2/6 HSM at apex.  Trace ankle edema.  No carotid bruit.  Normal pedal pulses.  Abdomen: Soft, nontender, no hepatosplenomegaly, no distention.  Neurologic: Alert and oriented x 3.  Psych: Normal affect. Extremities: No clubbing or cyanosis.

## 2011-01-10 NOTE — Assessment & Plan Note (Signed)
S/p AV nodal ablation with BIV-pacing.  Continue coumadin.

## 2011-01-10 NOTE — Assessment & Plan Note (Signed)
Last EF was 55%.  She has been taking Lasix regularly more recently and appears euvolemic on exam.  NYHA class II symptoms.

## 2011-01-12 ENCOUNTER — Emergency Department (HOSPITAL_COMMUNITY)
Admission: EM | Admit: 2011-01-12 | Discharge: 2011-01-12 | Disposition: A | Discharge status: Home / Self Care | Payer: Medicare Other | Attending: Emergency Medicine | Admitting: Emergency Medicine

## 2011-01-12 DIAGNOSIS — I2510 Atherosclerotic heart disease of native coronary artery without angina pectoris: Secondary | ICD-10-CM | POA: Insufficient documentation

## 2011-01-12 DIAGNOSIS — R040 Epistaxis: Secondary | ICD-10-CM | POA: Insufficient documentation

## 2011-01-12 DIAGNOSIS — Z951 Presence of aortocoronary bypass graft: Secondary | ICD-10-CM | POA: Insufficient documentation

## 2011-01-12 DIAGNOSIS — I10 Essential (primary) hypertension: Secondary | ICD-10-CM | POA: Insufficient documentation

## 2011-01-12 DIAGNOSIS — E785 Hyperlipidemia, unspecified: Secondary | ICD-10-CM | POA: Insufficient documentation

## 2011-01-12 DIAGNOSIS — Z7901 Long term (current) use of anticoagulants: Secondary | ICD-10-CM | POA: Insufficient documentation

## 2011-01-12 DIAGNOSIS — Z79899 Other long term (current) drug therapy: Secondary | ICD-10-CM | POA: Insufficient documentation

## 2011-01-12 DIAGNOSIS — Z7982 Long term (current) use of aspirin: Secondary | ICD-10-CM | POA: Insufficient documentation

## 2011-01-12 DIAGNOSIS — E119 Type 2 diabetes mellitus without complications: Secondary | ICD-10-CM | POA: Insufficient documentation

## 2011-01-12 DIAGNOSIS — Z950 Presence of cardiac pacemaker: Secondary | ICD-10-CM | POA: Insufficient documentation

## 2011-01-12 LAB — CBC
HCT: 33.1 % — ABNORMAL LOW (ref 36.0–46.0)
Hemoglobin: 11.1 g/dL — ABNORMAL LOW (ref 12.0–15.0)
MCH: 31.4 pg (ref 26.0–34.0)
MCV: 93.8 fL (ref 78.0–100.0)
RBC: 3.53 MIL/uL — ABNORMAL LOW (ref 3.87–5.11)

## 2011-01-12 LAB — PROTIME-INR: Prothrombin Time: 29.6 seconds — ABNORMAL HIGH (ref 11.6–15.2)

## 2011-01-18 LAB — CBC
HCT: 37
Hemoglobin: 12.3
MCV: 93.3
RBC: 3.96
WBC: 7.1

## 2011-01-18 LAB — CARDIAC PANEL(CRET KIN+CKTOT+MB+TROPI)
Relative Index: INVALID
Total CK: 110
Total CK: 88
Troponin I: 0.03

## 2011-01-18 LAB — BASIC METABOLIC PANEL
BUN: 22
BUN: 30 — ABNORMAL HIGH
BUN: 30 — ABNORMAL HIGH
CO2: 27
Calcium: 8.5
Calcium: 8.6
Chloride: 100
Creatinine, Ser: 1.52 — ABNORMAL HIGH
Creatinine, Ser: 1.97 — ABNORMAL HIGH
GFR calc Af Amer: 39 — ABNORMAL LOW
GFR calc non Af Amer: 24 — ABNORMAL LOW
GFR calc non Af Amer: 30 — ABNORMAL LOW
Glucose, Bld: 69 — ABNORMAL LOW

## 2011-01-18 LAB — COMPREHENSIVE METABOLIC PANEL
ALT: 161 — ABNORMAL HIGH
AST: 148 — ABNORMAL HIGH
CO2: 26
Calcium: 9.2
GFR calc Af Amer: 38 — ABNORMAL LOW
GFR calc non Af Amer: 32 — ABNORMAL LOW
Sodium: 141

## 2011-01-18 LAB — CK TOTAL AND CKMB (NOT AT ARMC): Total CK: 129

## 2011-01-18 LAB — B-NATRIURETIC PEPTIDE (CONVERTED LAB): Pro B Natriuretic peptide (BNP): 1886 — ABNORMAL HIGH

## 2011-01-18 LAB — DIFFERENTIAL
Eosinophils Absolute: 0 — ABNORMAL LOW
Eosinophils Relative: 0
Lymphs Abs: 1.3
Monocytes Relative: 7

## 2011-01-18 LAB — LIPID PANEL
Total CHOL/HDL Ratio: 3.7
VLDL: 16

## 2011-01-18 LAB — HEMOGLOBIN A1C
Hgb A1c MFr Bld: 6.1
Mean Plasma Glucose: 140

## 2011-01-28 ENCOUNTER — Ambulatory Visit (INDEPENDENT_AMBULATORY_CARE_PROVIDER_SITE_OTHER): Payer: Medicare Other | Admitting: *Deleted

## 2011-01-28 DIAGNOSIS — Z7901 Long term (current) use of anticoagulants: Secondary | ICD-10-CM

## 2011-01-28 DIAGNOSIS — I4891 Unspecified atrial fibrillation: Secondary | ICD-10-CM

## 2011-02-04 ENCOUNTER — Encounter: Payer: Self-pay | Admitting: Internal Medicine

## 2011-02-04 DIAGNOSIS — I495 Sick sinus syndrome: Secondary | ICD-10-CM

## 2011-02-26 ENCOUNTER — Other Ambulatory Visit: Payer: Self-pay | Admitting: Cardiology

## 2011-02-26 ENCOUNTER — Ambulatory Visit (INDEPENDENT_AMBULATORY_CARE_PROVIDER_SITE_OTHER): Payer: Medicare Other | Admitting: *Deleted

## 2011-02-26 DIAGNOSIS — Z7901 Long term (current) use of anticoagulants: Secondary | ICD-10-CM

## 2011-02-26 DIAGNOSIS — I4891 Unspecified atrial fibrillation: Secondary | ICD-10-CM

## 2011-02-26 LAB — POCT INR: INR: 2.5

## 2011-03-20 ENCOUNTER — Ambulatory Visit (INDEPENDENT_AMBULATORY_CARE_PROVIDER_SITE_OTHER): Payer: Medicare Other | Admitting: Internal Medicine

## 2011-03-20 ENCOUNTER — Encounter: Payer: Self-pay | Admitting: Internal Medicine

## 2011-03-20 ENCOUNTER — Ambulatory Visit (INDEPENDENT_AMBULATORY_CARE_PROVIDER_SITE_OTHER): Payer: Medicare Other | Admitting: *Deleted

## 2011-03-20 DIAGNOSIS — I509 Heart failure, unspecified: Secondary | ICD-10-CM

## 2011-03-20 DIAGNOSIS — Z7901 Long term (current) use of anticoagulants: Secondary | ICD-10-CM

## 2011-03-20 DIAGNOSIS — I4891 Unspecified atrial fibrillation: Secondary | ICD-10-CM

## 2011-03-20 DIAGNOSIS — I25810 Atherosclerosis of coronary artery bypass graft(s) without angina pectoris: Secondary | ICD-10-CM

## 2011-03-20 DIAGNOSIS — I5022 Chronic systolic (congestive) heart failure: Secondary | ICD-10-CM

## 2011-03-20 DIAGNOSIS — I498 Other specified cardiac arrhythmias: Secondary | ICD-10-CM

## 2011-03-20 DIAGNOSIS — Z950 Presence of cardiac pacemaker: Secondary | ICD-10-CM

## 2011-03-20 DIAGNOSIS — I5032 Chronic diastolic (congestive) heart failure: Secondary | ICD-10-CM

## 2011-03-20 LAB — PACEMAKER DEVICE OBSERVATION
BATTERY VOLTAGE: 2.78 V
LV LEAD THRESHOLD: 1 V
VENTRICULAR PACING PM: 99

## 2011-03-20 NOTE — Patient Instructions (Addendum)
Your physician wants you to follow-up in: 6 months in device clinic.  You will receive a reminder letter in the mail two months in advance. If you don't receive a letter, please call our office to schedule the follow-up appointment. Your physician has recommended you make the following change in your medication: STOP ASPIRIN 81 MG

## 2011-03-20 NOTE — Progress Notes (Signed)
HPI  Judith Zuniga is a 77 y.o. female following CRT implantation with AV junction ablation for uncontrolled atrial arrhythmias.  This was done in January.2009. This is associated with marked improvement in exercise tolerance. Echo cardiogram 2011 demonstrated improvement in left ventricular function   Left ventricle: Systolic function was normal. The estimated       ejection fraction was 55%. Wall motion was normal; there were no       regional wall motion abnormalities.     - Mitral valve: Calcified annulus. Mildly thickened leaflets .       Severe regurgitation directed centrally.Left atrium: moderately dilated.     - Right atrium:mildly to moderately dilated.     - Tricuspid valve: Severe regurgitation. Currently she is Struggling with a upper respiratory infection Past Medical History  Diagnosis Date  . S/P ablation of ventricular arrhythmia     AV Node (s/p)  . Pacemaker     BiV (s/p) and s/p atrioventricular nodal ablation with no implantable cardioverter-defibrillatior  . Type II or unspecified type diabetes mellitus without mention of complication, not stated as uncontrolled   . Cardiac pacemaker in situ   . CAD (coronary artery disease)     s/p redo bypass surgery in 1998 with patent graft in Jan. 2009.   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 Systolic heart failure     class II and euvolemic  . Chronic atrial fibrillation   . Hyperlipidemia   . Diabetes mellitus   . Premature ventricular contractions     frequent  . Elevated liver function tests     on amiodarone    Past Surgical History  Procedure Date  . Coronary artery bypass graft 1998    CABG x5: RIMA to diagonal one, RIMA to diagonal 2, SVG to PD, SVG to PL, SVG to circ  . Coronary artery bypass graft 1985    x3: LIMA to LAD, SVG to PD and SVG to PL  . Cardiac caths     multile  . Pacer generator change out     St. Jude pacemaker    Current Outpatient Prescriptions    Medication Sig Dispense Refill  . aspirin 81 MG tablet Take 81 mg by mouth daily.        Marland Kitchen atorvastatin (LIPITOR) 40 MG tablet TAKE ONE TABLET BY MOUTH EVERY DAY  30 tablet  5  . carvedilol (COREG) 25 MG tablet Take 25 mg by mouth 2 (two) times daily.        . furosemide (LASIX) 20 MG tablet Take 1 tablet (20 mg total) by mouth daily.  30 tablet  11  . glimepiride (AMARYL) 2 MG tablet Take 1 mg by mouth daily.        Marland Kitchen losartan (COZAAR) 100 MG tablet Take 0.5 tablets (50 mg total) by mouth daily.  15 tablet  9  . warfarin (COUMADIN) 5 MG tablet Take 1 tablet (5 mg total) by mouth as directed.  90 tablet  1  . azithromycin (ZITHROMAX) 250 MG tablet         No Known Allergies  Review of Systems negative except from HPI and PMH  Physical Exam Well developed and well nourished in no acute distress HENT normal E scleral and icterus clear Neck Supple JVP flat; carotids brisk and full Clear to ausculation Regular rate and rhythm, no murmurs gallops or rub Soft with active bowel sounds No clubbing cyanosis none Edema  Alert and oriented, grossly normal motor and sensory function Skin Warm and Dry   ECG demonstrates atrial fibrillation with biventricular pacing  Assessment and  Plan

## 2011-03-20 NOTE — Assessment & Plan Note (Signed)
Stable; will continue current medications 

## 2011-03-20 NOTE — Assessment & Plan Note (Signed)
The patient's device was interrogated.  The information was reviewed. No changes were made in the programming.    

## 2011-03-20 NOTE — Assessment & Plan Note (Signed)
Controlled the AV junction ablation. We'll discontinue her aspirin and continue her on warfarin

## 2011-03-20 NOTE — Assessment & Plan Note (Signed)
Discontinue aspirin a setting of chronic ischemic heart disease as she is still on warfarin

## 2011-04-10 ENCOUNTER — Encounter: Payer: Self-pay | Admitting: Internal Medicine

## 2011-04-24 ENCOUNTER — Other Ambulatory Visit: Payer: Self-pay | Admitting: Cardiology

## 2011-04-26 DIAGNOSIS — E119 Type 2 diabetes mellitus without complications: Secondary | ICD-10-CM | POA: Diagnosis not present

## 2011-04-30 ENCOUNTER — Ambulatory Visit (INDEPENDENT_AMBULATORY_CARE_PROVIDER_SITE_OTHER): Payer: Medicare Other | Admitting: *Deleted

## 2011-04-30 ENCOUNTER — Encounter: Payer: Self-pay | Admitting: Cardiology

## 2011-04-30 ENCOUNTER — Ambulatory Visit (INDEPENDENT_AMBULATORY_CARE_PROVIDER_SITE_OTHER): Payer: Medicare Other | Admitting: Cardiology

## 2011-04-30 DIAGNOSIS — Z7901 Long term (current) use of anticoagulants: Secondary | ICD-10-CM | POA: Diagnosis not present

## 2011-04-30 DIAGNOSIS — I25810 Atherosclerosis of coronary artery bypass graft(s) without angina pectoris: Secondary | ICD-10-CM

## 2011-04-30 DIAGNOSIS — I6529 Occlusion and stenosis of unspecified carotid artery: Secondary | ICD-10-CM

## 2011-04-30 DIAGNOSIS — R0602 Shortness of breath: Secondary | ICD-10-CM | POA: Diagnosis not present

## 2011-04-30 DIAGNOSIS — I5032 Chronic diastolic (congestive) heart failure: Secondary | ICD-10-CM

## 2011-04-30 DIAGNOSIS — E785 Hyperlipidemia, unspecified: Secondary | ICD-10-CM

## 2011-04-30 DIAGNOSIS — I4891 Unspecified atrial fibrillation: Secondary | ICD-10-CM | POA: Diagnosis not present

## 2011-04-30 DIAGNOSIS — I059 Rheumatic mitral valve disease, unspecified: Secondary | ICD-10-CM

## 2011-04-30 DIAGNOSIS — R55 Syncope and collapse: Secondary | ICD-10-CM

## 2011-04-30 LAB — BASIC METABOLIC PANEL
Calcium: 9.4 mg/dL (ref 8.4–10.5)
Creatinine, Ser: 1.1 mg/dL (ref 0.4–1.2)
GFR: 51.33 mL/min — ABNORMAL LOW (ref >60.00)

## 2011-04-30 LAB — BRAIN NATRIURETIC PEPTIDE: Pro B Natriuretic peptide (BNP): 505 pg/mL — ABNORMAL HIGH (ref 0.0–100.0)

## 2011-04-30 LAB — POCT INR: INR: 2.1

## 2011-04-30 NOTE — Patient Instructions (Signed)
Your physician recommends that you have lab work today--BMET/BNP 427.31  424.0  Use a cane every day.  Your physician wants you to follow-up in: 3 months with Dr Shirlee Latch. (April 2013).  You will receive a reminder letter in the mail two months in advance. If you don't receive a letter, please call our office to schedule the follow-up appointment.   Your physician has requested that you have a carotid duplex. This test is an ultrasound of the carotid arteries in your neck. It looks at blood flow through these arteries that supply the brain with blood. Allow one hour for this exam. There are no restrictions or special instructions. July 2013  Dr Shirlee Latch is changing your INR goal to 2-2.5.

## 2011-05-01 DIAGNOSIS — R55 Syncope and collapse: Secondary | ICD-10-CM | POA: Insufficient documentation

## 2011-05-01 NOTE — Progress Notes (Signed)
PCP: Dr. Selena Batten  78 yo with a history of atrial fibrillation s/p AV nodal ablation, CAD s/p CABG, and ischemic cardiomyopathy presents for cardiology followup.  She had initial CABG in 1985 with redo in 1998. She developed an ischemic cardiomyopathy with EF 25%.  However, with biventricular pacing, her EF had improved to 55% on last echo. That echo did show severe mitral regurgitation.    Patient lives alone. She has been stable symptomatically. She vacuums and mops in her house with no exertional dyspnea.  Stable weight.  No chest pain. She has had a number of nosebleeds since 11/12, some rather severe.  She is off ASA and only taking coumadin.  She has some gait instability and does not use a cane.  She had a fall in 11/12: stood up after using the bathroom and became lightheaded.  She briefly passed out.    Labs (12/11): K 4.7, creatinine 1.4, HCT 31.6 Labs (8/12): LDL 75, HDL 52 Labs (9/12): K 4, creatinine 1.2, pro-BNP 200, LDL 75, HDL 52  PMH: 1. Atrial fibrillation: s/p AV nodal ablation 2. Ischemic CMP: EF as low as 25%.  After CRT, EF improved.  Last echo (7/11): EF 55%, severe central MR, severe TR, moderate biatrial enlargement.  3.  St. Jude CRT device 4. CAD: s/p CABG 1985 with LIMA-LAD, SVG-PDA, SVG-PLV. Redo CABG 1998 with RIMA-D1 and D2, SVG-PDA, SVG-PLV, SVG-CFX.   5. Hyperlipidemia 6. Peripheral neuropathy.  7. Mitral regurgitation: Severe by 7/11 echo.  Possibly due to annular dilatation.  8. Carotid stenosis: Dopplers (7/12) with 40-59% bilateral stenosis.   SH: Lives alone in [place]. Retired.  Nonsmoker, no ETOH.    FH: Mother died at 64, h/o MI.  Father with probable sudden cardiac death at 2.   ROS: All systems reviewed and negative except as per HPI.    Current Outpatient Prescriptions  Medication Sig Dispense Refill  . atorvastatin (LIPITOR) 40 MG tablet TAKE ONE TABLET BY MOUTH EVERY DAY  30 tablet  5  . carvedilol (COREG) 25 MG tablet Take 25 mg by mouth 2  (two) times daily.        . furosemide (LASIX) 20 MG tablet Take 1 tablet (20 mg total) by mouth daily.  30 tablet  11  . glimepiride (AMARYL) 2 MG tablet Take 1 mg by mouth daily.        Marland Kitchen losartan (COZAAR) 100 MG tablet Take 0.5 tablets (50 mg total) by mouth daily.  15 tablet  9  . mupirocin ointment (BACTROBAN) 2 % As directed      . warfarin (COUMADIN) 5 MG tablet TAKE ONE TABLET BY MOUTH EVERY DAY AS DIRECTED  90 tablet  1    BP 154/67  Pulse 69  Ht 5\' 4"  (1.626 m)  Wt 57.153 kg (126 lb)  BMI 21.63 kg/m2 General: NAD, elderly Neck: JVP 7-8 cm, no thyromegaly or thyroid nodule.  Lungs: Clear to auscultation bilaterally with normal respiratory effort. CV: Nondisplaced PMI.  Heart regular S1/S2, no S3/S4, 3/6 HSM at apex.  1+ ankle edema.  No carotid bruit.  Normal pedal pulses.  Abdomen: Soft, nontender, no hepatosplenomegaly, no distention.  Neurologic: Alert and oriented x 3.  Psych: Normal affect. Extremities: No clubbing or cyanosis.

## 2011-05-01 NOTE — Assessment & Plan Note (Signed)
Mild to moderate bilateral carotid stenosis, repeat carotid dopplers in 7/13.

## 2011-05-01 NOTE — Assessment & Plan Note (Signed)
Last EF was 55%.  She is on Lasix and is near-euvolemic on exam.  NYHA class II symptoms.

## 2011-05-01 NOTE — Assessment & Plan Note (Signed)
S/p AV nodal ablation with BIV-pacing.  She is on coumadin but has had nosebleeds and had a fall in 11/12.  She has some gait unsteadiness.  I talked to her at length today about walking with a cane.  I am going to lower her INR goal to 2-2.5.  If she has further falls, will likely stop coumadin.

## 2011-05-01 NOTE — Assessment & Plan Note (Signed)
Patient had a syncopal episodes after using the bathroom in 11/12.  This was likely vagal as no significant events have been reported from interrogation of CRT device.

## 2011-05-01 NOTE — Assessment & Plan Note (Signed)
Stable s/p CABG and redo CABG with no ischemic symptoms.  Continue Coreg, ARB, and statin.  

## 2011-05-01 NOTE — Assessment & Plan Note (Signed)
LDL was near goal (< 70) in 9/12.

## 2011-05-03 ENCOUNTER — Telehealth: Payer: Self-pay | Admitting: *Deleted

## 2011-05-03 NOTE — Telephone Encounter (Signed)
05/03/11--1445p--spoke with ms Kingsbury and advised, due to lab work , we'd like her to weigh herself daily--pt agrees and states she can tell when she's retaining fluids as he feet swell--nt

## 2011-05-06 ENCOUNTER — Encounter: Payer: Self-pay | Admitting: Internal Medicine

## 2011-05-06 DIAGNOSIS — I495 Sick sinus syndrome: Secondary | ICD-10-CM

## 2011-05-07 DIAGNOSIS — I10 Essential (primary) hypertension: Secondary | ICD-10-CM | POA: Diagnosis not present

## 2011-05-07 DIAGNOSIS — E119 Type 2 diabetes mellitus without complications: Secondary | ICD-10-CM | POA: Diagnosis not present

## 2011-05-10 ENCOUNTER — Other Ambulatory Visit: Payer: Self-pay | Admitting: *Deleted

## 2011-05-10 MED ORDER — CARVEDILOL 25 MG PO TABS: 25.0000 mg | ORAL_TABLET | Freq: Two times a day (BID) | ORAL | Status: DC

## 2011-05-21 DIAGNOSIS — E119 Type 2 diabetes mellitus without complications: Secondary | ICD-10-CM | POA: Diagnosis not present

## 2011-06-11 ENCOUNTER — Ambulatory Visit (HOSPITAL_COMMUNITY)
Admission: RE | Admit: 2011-06-11 | Discharge: 2011-06-11 | Disposition: A | Discharge status: Home / Self Care | Payer: Medicare Other | Source: Ambulatory Visit | Attending: Rheumatology | Admitting: Rheumatology

## 2011-06-11 DIAGNOSIS — M79609 Pain in unspecified limb: Secondary | ICD-10-CM | POA: Insufficient documentation

## 2011-06-11 DIAGNOSIS — M7989 Other specified soft tissue disorders: Secondary | ICD-10-CM | POA: Diagnosis not present

## 2011-06-11 DIAGNOSIS — M25569 Pain in unspecified knee: Secondary | ICD-10-CM | POA: Diagnosis not present

## 2011-06-11 NOTE — Progress Notes (Signed)
Left lower extremity venous duplex completed at 14:35.  Preliminary report is negative for DVT, SVT, or a Baker's cyst in the left leg.  Negative for DVT in the right common femoral vein.

## 2011-06-13 ENCOUNTER — Ambulatory Visit (INDEPENDENT_AMBULATORY_CARE_PROVIDER_SITE_OTHER): Payer: Medicare Other | Admitting: Pharmacist

## 2011-06-13 DIAGNOSIS — M25569 Pain in unspecified knee: Secondary | ICD-10-CM | POA: Diagnosis not present

## 2011-06-13 DIAGNOSIS — I4891 Unspecified atrial fibrillation: Secondary | ICD-10-CM

## 2011-06-13 DIAGNOSIS — Z7901 Long term (current) use of anticoagulants: Secondary | ICD-10-CM

## 2011-06-13 DIAGNOSIS — M7989 Other specified soft tissue disorders: Secondary | ICD-10-CM | POA: Diagnosis not present

## 2011-06-13 LAB — POCT INR: INR: 3.1

## 2011-06-14 ENCOUNTER — Other Ambulatory Visit: Payer: Self-pay | Admitting: Rheumatology

## 2011-06-14 DIAGNOSIS — R52 Pain, unspecified: Secondary | ICD-10-CM

## 2011-06-18 ENCOUNTER — Ambulatory Visit
Admission: RE | Admit: 2011-06-18 | Discharge: 2011-06-18 | Disposition: A | Discharge status: Home / Self Care | Payer: Medicare Other | Source: Ambulatory Visit | Attending: Rheumatology | Admitting: Rheumatology

## 2011-06-18 DIAGNOSIS — R52 Pain, unspecified: Secondary | ICD-10-CM

## 2011-06-18 DIAGNOSIS — M7989 Other specified soft tissue disorders: Secondary | ICD-10-CM | POA: Diagnosis not present

## 2011-06-18 DIAGNOSIS — M25569 Pain in unspecified knee: Secondary | ICD-10-CM | POA: Diagnosis not present

## 2011-06-18 DIAGNOSIS — S7000XA Contusion of unspecified hip, initial encounter: Secondary | ICD-10-CM | POA: Diagnosis not present

## 2011-06-18 DIAGNOSIS — M79609 Pain in unspecified limb: Secondary | ICD-10-CM | POA: Diagnosis not present

## 2011-06-22 ENCOUNTER — Emergency Department (INDEPENDENT_AMBULATORY_CARE_PROVIDER_SITE_OTHER)
Admission: EM | Admit: 2011-06-22 | Discharge: 2011-06-22 | Disposition: A | Discharge status: Home / Self Care | Payer: Medicare Other | Source: Home / Self Care | Attending: Family Medicine | Admitting: Family Medicine

## 2011-06-22 ENCOUNTER — Encounter (HOSPITAL_COMMUNITY): Payer: Self-pay | Admitting: *Deleted

## 2011-06-22 DIAGNOSIS — L139 Bullous disorder, unspecified: Secondary | ICD-10-CM | POA: Diagnosis not present

## 2011-06-22 MED ORDER — SILVER SULFADIAZINE 1 % EX CREA
TOPICAL_CREAM | Freq: Once | CUTANEOUS | Status: AC
  Administered 2011-06-22: 12:00:00 via TOPICAL

## 2011-06-22 MED ORDER — SILVER SULFADIAZINE 1 % EX CREA: TOPICAL_CREAM | Freq: Two times a day (BID) | CUTANEOUS | Status: DC

## 2011-06-22 MED ORDER — CEPHALEXIN 500 MG PO CAPS: 500.0000 mg | ORAL_CAPSULE | Freq: Four times a day (QID) | ORAL | Status: AC

## 2011-06-22 NOTE — Discharge Instructions (Signed)
Please apply prescribed cream to the wound daily for the first 2-3 days. Change the dressing at least once daily with a clean, dry dressing. Keep the wound clean, with soap and water. Return to care. Should symptoms worsen, such as increased redness, pain, drainage or warmth to the area. Please followup with your regular doctor as discussed.

## 2011-06-22 NOTE — ED Notes (Addendum)
Pt with large blister anterior left foot - has been seen and treated by own md - told blister would break - 5am this morning blister broke here for treatment - per pt and daughter blister appeared 5 days ago seen by own md - ct scan

## 2011-06-22 NOTE — ED Provider Notes (Signed)
History     CSN: [id number]  Arrival date & time 06/22/11  1114   First MD Initiated Contact with Patient 06/22/11 1119      Chief Complaint  Patient presents with  . Blister    (Consider location/radiation/quality/duration/timing/severity/associated sxs/prior treatment) HPI Comments: Soriah is brought in by her family for evaluation of a very large bullous lesion over the dorsum of her left foot. Her daughter reports that she recently had an episode where her entire left leg was darkened in color and swollen. She was evaluated by a rheumatologist at this point was uncertain of the etiology. The discoloration has since resolved. There is a large bullous lesion that remains over the dorsum of her left foot.  The history is provided by the patient and a relative.    Past Medical History  Diagnosis Date  . S/P ablation of ventricular arrhythmia     AV Node (s/p)  . Pacemaker     BiV (s/p) and s/p atrioventricular nodal ablation with no implantable cardioverter-defibrillatior  . Type II or unspecified type diabetes mellitus without mention of complication, not stated as uncontrolled   . Cardiac pacemaker in situ   . CAD (coronary artery disease)     s/p redo bypass surgery in 1998 with patent graft in Jan. 2009.   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 Systolic heart failure     class II and euvolemic  . Chronic atrial fibrillation   . Hyperlipidemia   . Diabetes mellitus   . Premature ventricular contractions     frequent  . Elevated liver function tests     on amiodarone    Past Surgical History  Procedure Date  . Coronary artery bypass graft 1998    CABG x5: RIMA to diagonal one, RIMA to diagonal 2, SVG to PD, SVG to PL, SVG to circ  . Coronary artery bypass graft 1985    x3: LIMA to LAD, SVG to PD and SVG to PL  . Cardiac caths     multile  . Pacer generator change out     St. Jude pacemaker    History reviewed. No pertinent family  history.  History  Substance Use Topics  . Smoking status: Never Smoker   . Smokeless tobacco: Not on file  . Alcohol Use: No    OB History    Grav Para Term Preterm Abortions TAB SAB Ect Mult Living                  Review of Systems  Constitutional: Negative.   HENT: Negative.   Eyes: Negative.   Respiratory: Negative.   Cardiovascular: Positive for leg swelling.  Gastrointestinal: Negative.   Genitourinary: Negative.   Musculoskeletal: Negative.   Skin: Positive for color change and wound.  Neurological: Negative.     Allergies  Review of patient's allergies indicates no known allergies.  Home Medications   Current Outpatient Rx  Name Route Sig Dispense Refill  . ATORVASTATIN CALCIUM 40 MG PO TABS  TAKE ONE TABLET BY MOUTH EVERY DAY 30 tablet 5  . CARVEDILOL 25 MG PO TABS Oral Take 1 tablet (25 mg total) by mouth 2 (two) times daily. 60 tablet 5  . FUROSEMIDE 20 MG PO TABS Oral Take 1 tablet (20 mg total) by mouth daily. 30 tablet 11  . GLIMEPIRIDE 2 MG PO TABS Oral Take 1 mg by mouth daily.      Marland Kitchen HYDROCODONE-ACETAMINOPHEN 5-325  MG PO TABS Oral Take 1 tablet by mouth every 6 (six) hours as needed.    Marland Kitchen LOSARTAN POTASSIUM 100 MG PO TABS Oral Take 0.5 tablets (50 mg total) by mouth daily. 15 tablet 9  . WARFARIN SODIUM 5 MG PO TABS  TAKE ONE TABLET BY MOUTH EVERY DAY AS DIRECTED 90 tablet 1    90 tablets is 90 day supply  . CEPHALEXIN 500 MG PO CAPS Oral Take 1 capsule (500 mg total) by mouth 4 (four) times daily. 28 capsule 0  . MUPIROCIN 2 % EX OINT  As directed    . SILVER SULFADIAZINE 1 % EX CREA Topical Apply topically 2 (two) times daily. 50 g 0    BP 108/76  Pulse 70  Temp(Src) 98.3 F (36.8 C) (Oral)  Resp 18  SpO2 100%  Physical Exam  Nursing note and vitals reviewed. Constitutional: She is oriented to person, place, and time. She appears well-developed and well-nourished.  HENT:  Head: Normocephalic and atraumatic.  Eyes: EOM are normal.  Neck:  Normal range of motion.  Pulmonary/Chest: Effort normal.  Musculoskeletal: Normal range of motion.       Left lower leg: She exhibits swelling and edema.       Legs:      Feet:  Neurological: She is alert and oriented to person, place, and time.  Skin: Skin is warm and dry. Lesion noted.  Psychiatric: Her behavior is normal.    ED Course  Debridement Date/Time: 06/22/2011 12:00 PM Performed by: Renaee Munda Authorized by: Delanna Notice B Consent: Verbal consent obtained. Risks and benefits: risks, benefits and alternatives were discussed Consent given by: patient Patient understanding: patient states understanding of the procedure being performed Patient identity confirmed: verbally with patient and arm band Preparation: Patient was prepped and draped in the usual sterile fashion. Local anesthesia used: no Patient tolerance: Patient tolerated the procedure well with no immediate complications. Comments: Large 8 cm bullous lesion incised, debrided dead skin; covered with silvadene; dry. Sterile dressing.   (including critical care time)  Labs Reviewed - No data to display No results found.   1. Bullous eruption, localized       MDM  Incised and drained; skin debrided; silvadene applied; dry, sterile dressing; rx for cephalexin and silver sulfadiazene given       Renaee Munda, MD 06/22/11 1246

## 2011-06-26 DIAGNOSIS — L03119 Cellulitis of unspecified part of limb: Secondary | ICD-10-CM | POA: Diagnosis not present

## 2011-06-26 DIAGNOSIS — R509 Fever, unspecified: Secondary | ICD-10-CM | POA: Diagnosis not present

## 2011-06-26 DIAGNOSIS — Z4800 Encounter for change or removal of nonsurgical wound dressing: Secondary | ICD-10-CM | POA: Diagnosis not present

## 2011-06-28 DIAGNOSIS — Z4800 Encounter for change or removal of nonsurgical wound dressing: Secondary | ICD-10-CM | POA: Diagnosis not present

## 2011-06-28 DIAGNOSIS — L02619 Cutaneous abscess of unspecified foot: Secondary | ICD-10-CM | POA: Diagnosis not present

## 2011-06-28 DIAGNOSIS — L03119 Cellulitis of unspecified part of limb: Secondary | ICD-10-CM | POA: Diagnosis not present

## 2011-07-03 ENCOUNTER — Ambulatory Visit (INDEPENDENT_AMBULATORY_CARE_PROVIDER_SITE_OTHER): Payer: Medicare Other | Admitting: *Deleted

## 2011-07-03 ENCOUNTER — Telehealth: Payer: Self-pay | Admitting: *Deleted

## 2011-07-03 DIAGNOSIS — I4891 Unspecified atrial fibrillation: Secondary | ICD-10-CM

## 2011-07-03 DIAGNOSIS — Z7901 Long term (current) use of anticoagulants: Secondary | ICD-10-CM | POA: Diagnosis not present

## 2011-07-03 LAB — POCT INR: INR: 7.2

## 2011-07-03 LAB — PROTIME-INR
INR: 5.44 (ref <1.50)
Prothrombin Time: 50.3 seconds — ABNORMAL HIGH (ref 11.6–15.2)

## 2011-07-03 NOTE — Telephone Encounter (Signed)
Lab addressed by Coumadin clinic.  See anticoagulation encounter for details.

## 2011-07-03 NOTE — Patient Instructions (Signed)
Follow anticoagulant therapy guidelines for safety with elevated INR.

## 2011-07-03 NOTE — Progress Notes (Signed)
POC-INR-- 7.2--- 07-03-11

## 2011-07-03 NOTE — Telephone Encounter (Signed)
Received a call from lab on patients INR - 5.44 and PT 50.3 repeated and verified  Will forward to coumadin clinic for reveiw

## 2011-07-04 DIAGNOSIS — S81809A Unspecified open wound, unspecified lower leg, initial encounter: Secondary | ICD-10-CM | POA: Diagnosis not present

## 2011-07-07 ENCOUNTER — Emergency Department (HOSPITAL_COMMUNITY): Payer: Medicare Other

## 2011-07-07 ENCOUNTER — Emergency Department (HOSPITAL_COMMUNITY)
Admission: EM | Admit: 2011-07-07 | Discharge: 2011-07-08 | Disposition: A | Discharge status: Home / Self Care | Payer: Medicare Other | Attending: Emergency Medicine | Admitting: Emergency Medicine

## 2011-07-07 ENCOUNTER — Encounter (HOSPITAL_COMMUNITY): Payer: Self-pay | Admitting: *Deleted

## 2011-07-07 DIAGNOSIS — I2510 Atherosclerotic heart disease of native coronary artery without angina pectoris: Secondary | ICD-10-CM | POA: Diagnosis not present

## 2011-07-07 DIAGNOSIS — Z79899 Other long term (current) drug therapy: Secondary | ICD-10-CM | POA: Diagnosis not present

## 2011-07-07 DIAGNOSIS — E1169 Type 2 diabetes mellitus with other specified complication: Secondary | ICD-10-CM | POA: Diagnosis not present

## 2011-07-07 DIAGNOSIS — G319 Degenerative disease of nervous system, unspecified: Secondary | ICD-10-CM | POA: Insufficient documentation

## 2011-07-07 DIAGNOSIS — R7301 Impaired fasting glucose: Secondary | ICD-10-CM | POA: Diagnosis not present

## 2011-07-07 DIAGNOSIS — E162 Hypoglycemia, unspecified: Secondary | ICD-10-CM

## 2011-07-07 DIAGNOSIS — I4891 Unspecified atrial fibrillation: Secondary | ICD-10-CM | POA: Diagnosis not present

## 2011-07-07 DIAGNOSIS — R51 Headache: Secondary | ICD-10-CM | POA: Diagnosis not present

## 2011-07-07 DIAGNOSIS — F4489 Other dissociative and conversion disorders: Secondary | ICD-10-CM | POA: Diagnosis not present

## 2011-07-07 DIAGNOSIS — W19XXXA Unspecified fall, initial encounter: Secondary | ICD-10-CM

## 2011-07-07 LAB — GLUCOSE, CAPILLARY: Glucose-Capillary: 106 mg/dL — ABNORMAL HIGH (ref 70–99)

## 2011-07-07 LAB — POCT I-STAT, CHEM 8
BUN: 27 mg/dL — ABNORMAL HIGH (ref 6–23)
Creatinine, Ser: 1 mg/dL (ref 0.50–1.10)
Glucose, Bld: 114 mg/dL — ABNORMAL HIGH (ref 70–99)
Hemoglobin: 9.5 g/dL — ABNORMAL LOW (ref 12.0–15.0)
Potassium: 3.7 mEq/L (ref 3.5–5.1)
TCO2: 25 mmol/L (ref 0–100)

## 2011-07-07 LAB — CBC
MCH: 30.6 pg (ref 26.0–34.0)
MCV: 95.5 fL (ref 78.0–100.0)
Platelets: 196 10*3/uL (ref 150–400)
RDW: 15.5 % (ref 11.5–15.5)

## 2011-07-07 NOTE — ED Notes (Signed)
Patient brought in by EMS from home after being found in the bathroom by her family. Patient reports "passing out" in the bathroom, but denies injury or hitting head.  Per EMS, patient's blood sugar was 43 upon arrival. Patient was able to eat a sandwich en route and re-check of patient's blood sugar en route was 53. Patient AOx3 and c/o 10/10 left foot pain. Patient has ulceration to left anterior foot; wrapped upon arrival.

## 2011-07-07 NOTE — ED Provider Notes (Signed)
History     CSN: [id number]  Arrival date & time 07/07/11  7829   First MD Initiated Contact with Patient 07/07/11 2149      Chief Complaint  Patient presents with  . Hypoglycemia    (Consider location/radiation/quality/duration/timing/severity/associated sxs/prior treatment) HPI Comments: Patient has been have an infection in her foot for the last 3 weeks and has been slightly disoriented for the last couple days.  Friend reports that she spoke to her at 2:30 this afternoon.  She was at her normal baseline.  The patient called her at 6:30 and reported that she had fallen in the bathroom.  Patient is unsure, if she's eaten today, if she's taken her medication today.  Denies hitting her head.  There is no obvious sign of head trauma, but she is on Coumadin per her report, the last INR check was on Wednesday, and her INR was 7, but she was told to hold the dose for several days and eat greens, to  have it rechecked on Monday  The history is provided by the patient and a friend.    Past Medical History  Diagnosis Date  . S/P ablation of ventricular arrhythmia     AV Node (s/p)  . Pacemaker     BiV (s/p) and s/p atrioventricular nodal ablation with no implantable cardioverter-defibrillatior  . Type II or unspecified type diabetes mellitus without mention of complication, not stated as uncontrolled   . Cardiac pacemaker in situ   . CAD (coronary artery disease)     s/p redo bypass surgery in 1998 with patent graft in Jan. 2009.   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 Systolic heart failure     class II and euvolemic  . Chronic atrial fibrillation   . Hyperlipidemia   . Diabetes mellitus   . Premature ventricular contractions     frequent  . Elevated liver function tests     on amiodarone    Past Surgical History  Procedure Date  . Coronary artery bypass graft 1998    CABG x5: RIMA to diagonal one, RIMA to diagonal 2, SVG to PD, SVG to PL, SVG to  circ  . Coronary artery bypass graft 1985    x3: LIMA to LAD, SVG to PD and SVG to PL  . Cardiac caths     multile  . Pacer generator change out     St. Jude pacemaker    History reviewed. No pertinent family history.  History  Substance Use Topics  . Smoking status: Never Smoker   . Smokeless tobacco: Not on file  . Alcohol Use: No    OB History    Grav Para Term Preterm Abortions TAB SAB Ect Mult Living                  Review of Systems  Constitutional: Negative for fever.  HENT: Negative for congestion.   Respiratory: Negative for cough and shortness of breath.   Gastrointestinal: Negative for nausea and vomiting.  Neurological: Negative for dizziness, weakness and headaches.  Psychiatric/Behavioral: Positive for confusion.    Allergies  Review of patient's allergies indicates no known allergies.  Home Medications   Current Outpatient Rx  Name Route Sig Dispense Refill  . ACETAMINOPHEN 500 MG PO TABS Oral Take 1,000 mg by mouth every 6 (six) hours as needed. For pain    . ATORVASTATIN CALCIUM 40 MG PO TABS  TAKE ONE TABLET BY MOUTH  EVERY DAY 30 tablet 5  . CARVEDILOL 25 MG PO TABS Oral Take 1 tablet (25 mg total) by mouth 2 (two) times daily. 60 tablet 5  . FUROSEMIDE 20 MG PO TABS Oral Take 1 tablet (20 mg total) by mouth daily. 30 tablet 11  . GLIMEPIRIDE 2 MG PO TABS Oral Take 1 mg by mouth daily.      Marland Kitchen HYDROCODONE-ACETAMINOPHEN 5-325 MG PO TABS Oral Take 1 tablet by mouth every 6 (six) hours as needed.    Marland Kitchen LOSARTAN POTASSIUM 100 MG PO TABS Oral Take 0.5 tablets (50 mg total) by mouth daily. 15 tablet 9  . SILVER SULFADIAZINE 1 % EX CREA Topical Apply topically 2 (two) times daily. 50 g 0  . WARFARIN SODIUM 5 MG PO TABS Oral Take 5 mg by mouth daily.    . WARFARIN SODIUM 5 MG PO TABS        BP 135/64  Pulse 71  Temp(Src) 98.3 F (36.8 C) (Oral)  Resp 15  SpO2 96%  Physical Exam  Constitutional: She appears well-developed and well-nourished.  HENT:   Head: Normocephalic.  Eyes: Pupils are equal, round, and reactive to light.  Neck: Normal range of motion.  Cardiovascular: Normal rate.   Pulmonary/Chest: Effort normal.  Abdominal: She exhibits no distension. There is no tenderness.  Musculoskeletal:       Known L foot infection followed by Dr, Lajoyce Corners  Neurological: She is alert.  Skin: Skin is warm and dry.    ED Course  Procedures (including critical care time)  Labs Reviewed  GLUCOSE, CAPILLARY - Abnormal; Notable for the following:    Glucose-Capillary 106 (*)    All other components within normal limits  CBC - Abnormal; Notable for the following:    RBC 2.91 (*)    Hemoglobin 8.9 (*)    HCT 27.8 (*)    All other components within normal limits  PROTIME-INR - Abnormal; Notable for the following:    Prothrombin Time 28.7 (*)    INR 2.65 (*)    All other components within normal limits  POCT I-STAT, CHEM 8 - Abnormal; Notable for the following:    BUN 27 (*)    Glucose, Bld 114 (*)    Hemoglobin 9.5 (*)    HCT 28.0 (*)    All other components within normal limits  GLUCOSE, CAPILLARY - Abnormal; Notable for the following:    Glucose-Capillary 113 (*)    All other components within normal limits   Ct Head Wo Contrast  07/07/2011  *RADIOLOGY REPORT*  Clinical Data: Status post fall; headache.  On Coumadin.  CT HEAD WITHOUT CONTRAST  Technique:  Contiguous axial images were obtained from the base of the skull through the vertex without contrast.  Comparison: CT of the head performed 09/18/2010  Findings: There is no evidence of acute infarction, mass lesion, or intra- or extra-axial hemorrhage on CT.  Scattered periventricular and subcortical white matter change likely reflects small vessel ischemic microangiopathy.  Mild age- appropriate cortical volume loss is noted, with slight prominence of the ventricles and sulci.  Mild cerebellar atrophy is seen.  The brainstem and fourth ventricle are within normal limits.  The basal  ganglia are unremarkable in appearance.  The cerebral hemispheres demonstrate grossly normal gray-white differentiation. No mass effect or midline shift is seen.  There is no evidence of fracture; visualized osseous structures are unremarkable in appearance.  The orbits are within normal limits. The paranasal sinuses and mastoid air cells are  well-aerated.  No significant soft tissue abnormalities are seen.  IMPRESSION:  1.  No evidence of traumatic intracranial injury or fracture. 2.  Mild age-appropriate cortical volume loss and scattered small vessel ischemic microangiopathy.  Original Report Authenticated By: Tonia Ghent, M.D.     1. Hypoglycemia   2. Fall       MDM  Hypoglycemia most likely from taking medications without eating but patient unsure if she took her meds or ate  Will check head CT, INR, I-sta,t cbc         Arman Filter, NP 07/08/11 0056  Arman Filter, NP 07/08/11 0056  Arman Filter, NP 07/08/11 1610

## 2011-07-07 NOTE — ED Notes (Signed)
Patient provided with sandwich and crackers at this time. Awaiting discharge instructions.

## 2011-07-07 NOTE — ED Notes (Signed)
Patient brought in from home by EMS after being found on the ground by family at home. Patient's initial FSBS =43; en route patient's FSBS=53. Patient AOx3. Patient denies hitting head or any other injuries. Hx of Type 2 Diabetes.

## 2011-07-07 NOTE — ED Notes (Signed)
Gail, NP at bedside. 

## 2011-07-07 NOTE — ED Notes (Signed)
Patient transported to CT 

## 2011-07-08 DIAGNOSIS — E1149 Type 2 diabetes mellitus with other diabetic neurological complication: Secondary | ICD-10-CM | POA: Diagnosis not present

## 2011-07-08 DIAGNOSIS — I70269 Atherosclerosis of native arteries of extremities with gangrene, unspecified extremity: Secondary | ICD-10-CM | POA: Diagnosis not present

## 2011-07-08 DIAGNOSIS — L97409 Non-pressure chronic ulcer of unspecified heel and midfoot with unspecified severity: Secondary | ICD-10-CM | POA: Diagnosis not present

## 2011-07-08 LAB — URINALYSIS, ROUTINE W REFLEX MICROSCOPIC
Bilirubin Urine: NEGATIVE
Nitrite: NEGATIVE
Specific Gravity, Urine: 1.017 (ref 1.005–1.030)
pH: 6.5 (ref 5.0–8.0)

## 2011-07-08 LAB — URINE MICROSCOPIC-ADD ON

## 2011-07-08 NOTE — ED Notes (Signed)
Dr. Knapp to bedside. 

## 2011-07-08 NOTE — ED Provider Notes (Signed)
See prior note   Ward Givens, MD 07/08/11 1031

## 2011-07-08 NOTE — ED Provider Notes (Signed)
Patient lives alone. She has had a infection in her left foot for the past 3 weeks. Chest an appointment tomorrow Dr. Lajoyce Corners. She has diabetes and states she normally checks her blood sugar rated but she did not check it today. Is unsure if she ate today or even took her medicine today. However she did fall and was unable to get up. EMS report her blood sugar was 48. Patient states she fixes her food although her daughter did bring her some food yesterday that she states she ate today. Patient seems a little confused but daughter doesn't seem to feel like this is something new. She is alert and cooperative.  Medical screening examination/treatment/procedure(s) were conducted as a shared visit with non-physician practitioner(s) and myself.  I personally evaluated the patient during the encounter Devoria Albe, MD, Franz Dell, MD 07/08/11 [phone]

## 2011-07-08 NOTE — Discharge Instructions (Signed)
Low Blood Sugar Low blood sugar (hypoglycemia) means that the level of sugar in your blood is lower than it should be. Signs of low blood sugar include:  Getting sweaty.   Feeling hungry.   Feeling dizzy or weak.   Feeling sleepier than normal.   Feeling nervous.   Headaches.   Having a fast heartbeat.  Low blood sugar can happen fast and can be an emergency. Your doctor can do tests to check your blood sugar level. You can have low blood sugar and not have diabetes. HOME CARE  Check your blood sugar as told by your doctor. If it is less than 70 mg/dl or as told by your doctor, take 1 of the following:   3 to 4 glucose tablets.    cup clear juice.    cup soda pop, not diet.   1 cup milk.   5 to 6 hard candies.   Recheck blood sugar after 15 minutes. Repeat until it is at the right level.   Eat a snack if it is more than 1 hour until the next meal.   Only take medicine as told by your doctor.   Do not skip meals. Eat on time.   Do not drink alcohol except with meals.   Check your blood glucose before driving.   Check your blood glucose before and after exercise.   Always carry treatment with you, such as glucose pills.   Always wear a medical alert bracelet if you have diabetes.  GET HELP RIGHT AWAY IF:   Your blood glucose goes below 70 mg/dl or as told by your doctor, and you:   Are confused.   Are not able to swallow.   Pass out (faint).   You cannot treat yourself. You may need someone to help you.   You have low blood sugar problems often.   You have problems from your medicines.   You are not feeling better after 3 to 4 days.   You have vision changes.  MAKE SURE YOU:   Understand these instructions.   Will watch this condition.   Will get help right away if you are not doing well or get worse.  Document Released: 06/26/2009 Document Revised: 03/21/2011 Document Reviewed: 06/26/2009 [facility] Patient Information 2012 [place],  [place]. It is very important that you take your medication on a daily dressing basis, that she checked her blood sugar daily.  If your sugars are consistently in the low range I would discuss this with your physician, Dr. Selena Batten to see if this is even necessary any longer

## 2011-07-09 ENCOUNTER — Ambulatory Visit (INDEPENDENT_AMBULATORY_CARE_PROVIDER_SITE_OTHER): Payer: Medicare Other | Admitting: Vascular Surgery

## 2011-07-09 ENCOUNTER — Encounter: Payer: Self-pay | Admitting: Vascular Surgery

## 2011-07-09 VITALS — BP 158/66 | HR 91 | Temp 98.4°F | Resp 18 | Ht 63.0 in | Wt 130.0 lb

## 2011-07-09 DIAGNOSIS — I739 Peripheral vascular disease, unspecified: Secondary | ICD-10-CM | POA: Diagnosis not present

## 2011-07-09 DIAGNOSIS — L98499 Non-pressure chronic ulcer of skin of other sites with unspecified severity: Secondary | ICD-10-CM | POA: Diagnosis not present

## 2011-07-09 DIAGNOSIS — I7025 Atherosclerosis of native arteries of other extremities with ulceration: Secondary | ICD-10-CM | POA: Insufficient documentation

## 2011-07-09 DIAGNOSIS — L97909 Non-pressure chronic ulcer of unspecified part of unspecified lower leg with unspecified severity: Secondary | ICD-10-CM | POA: Diagnosis not present

## 2011-07-09 NOTE — Progress Notes (Signed)
Patient presents today for evaluation of arterial flow to her left foot. She has an unusual situation of a large bullous lesion arising from the dorsum of her foot approximately 3 weeks ago. She is here today with her daughter. Fortunately she has a picture of what the foot looked like 3 weeks ago. Very unusual situation she did have some bruising throughout her foot. The only event around this time was in the injection for knee pain. He specifically did not recall wearing tight shoes or walking for great distances. Some bullous lesion ruptured and she's being treated with Silvadene. She is on chronic Coumadin. He does not have any prior history of rest pain or claudication type symptoms. Past medical history is significant for coronary bypass grafting with vein harvest from both calves. He does report pain associated with the left foot currently. There is some edema.  Past Medical History  Diagnosis Date  . S/P ablation of ventricular arrhythmia     AV Node (s/p)  . Pacemaker     BiV (s/p) and s/p atrioventricular nodal ablation with no implantable cardioverter-defibrillatior  . Type II or unspecified type diabetes mellitus without mention of complication, not stated as uncontrolled   . Cardiac pacemaker in situ   . CAD (coronary artery disease)     s/p redo bypass surgery in 1998 with patent graft in Jan. 2009.   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 Systolic heart failure     class II and euvolemic  . Chronic atrial fibrillation   . Hyperlipidemia   . Diabetes mellitus   . Premature ventricular contractions     frequent  . Elevated liver function tests     on amiodarone    History  Substance Use Topics  . Smoking status: Never Smoker   . Smokeless tobacco: Never Used  . Alcohol Use: No    Family History  Problem Relation Age of Onset  . Heart disease Father   . Cancer Father     GALLBLADDER  . Cancer Sister     No Known Allergies  Current  outpatient prescriptions:acetaminophen (TYLENOL) 500 MG tablet, Take 1,000 mg by mouth every 6 (six) hours as needed. For pain, Disp: , Rfl: ;  atorvastatin (LIPITOR) 40 MG tablet, TAKE ONE TABLET BY MOUTH EVERY DAY, Disp: 30 tablet, Rfl: 5;  carvedilol (COREG) 25 MG tablet, Take 1 tablet (25 mg total) by mouth 2 (two) times daily., Disp: 60 tablet, Rfl: 5 furosemide (LASIX) 20 MG tablet, Take 1 tablet (20 mg total) by mouth daily., Disp: 30 tablet, Rfl: 11;  glimepiride (AMARYL) 2 MG tablet, Take 1 mg by mouth daily.  , Disp: , Rfl: ;  HYDROcodone-acetaminophen (NORCO) 5-325 MG per tablet, Take 1 tablet by mouth every 6 (six) hours as needed., Disp: , Rfl: ;  losartan (COZAAR) 100 MG tablet, Take 0.5 tablets (50 mg total) by mouth daily., Disp: 15 tablet, Rfl: 9 silver sulfADIAZINE (SILVADENE) 1 % cream, Apply topically 2 (two) times daily., Disp: 50 g, Rfl: 0;  warfarin (COUMADIN) 5 MG tablet, Take 5 mg by mouth daily., Disp: , Rfl:   BP 158/66  Pulse 91  Temp 98.4 F (36.9 C)  Resp 18  Ht 5\' 3"  (1.6 m)  Wt 130 lb (58.968 kg)  BMI 23.03 kg/m2  Body mass index is 23.03 kg/(m^2).       Review of systems negative except for above  Physical exam: Well-developed well-nourished white  female appearing younger than stated age of 44 she has 2+ radial 2+ femoral and 2+ popliteal pulses bilaterally. I do not feel any pedal pulses bilaterally. She has no lesions on her right foot. On her left foot she is a large area approximately 2 x 4 cm which appears to be partial thickness skin loss over the dorsum of her foot just down to the edge of her toes. She has some diffuse edema in her leg from her knee distally but no evidence of deep infection.  Noninvasive studies reveal some calcification of her vessels. She does have triphasic waveforms on the left monophasic on the right  Impression and plan: Unusual tissue loss on the dorsum of her left foot that appears to be partial thickness. I am unclear as to  the etiology of this. She has normal arterial flow down to the level of her popliteal level bilaterally. This does appear to be healing with local care. I have recommended to continue local wound care and oral antibiotics as is being done. I explained that she could have arteriography and further evaluation but difficult with normal popliteal flow and the tissue loss in the foot. Also she has had prior vein harvest which could make bypass more difficult. This does appear to be improving and is only 3 weeks out from initial treatment. He will continue with local wound care and see [place] if she has progression of her lesions at which time we would proceed with arteriography

## 2011-07-10 ENCOUNTER — Ambulatory Visit (INDEPENDENT_AMBULATORY_CARE_PROVIDER_SITE_OTHER): Payer: Medicare Other | Admitting: Pharmacist

## 2011-07-10 DIAGNOSIS — I4891 Unspecified atrial fibrillation: Secondary | ICD-10-CM

## 2011-07-10 DIAGNOSIS — Z7901 Long term (current) use of anticoagulants: Secondary | ICD-10-CM | POA: Diagnosis not present

## 2011-07-10 LAB — POCT INR: INR: 3

## 2011-07-22 ENCOUNTER — Ambulatory Visit (INDEPENDENT_AMBULATORY_CARE_PROVIDER_SITE_OTHER): Payer: Medicare Other | Admitting: *Deleted

## 2011-07-22 DIAGNOSIS — Z7901 Long term (current) use of anticoagulants: Secondary | ICD-10-CM

## 2011-07-22 DIAGNOSIS — I4891 Unspecified atrial fibrillation: Secondary | ICD-10-CM

## 2011-07-25 DIAGNOSIS — L97409 Non-pressure chronic ulcer of unspecified heel and midfoot with unspecified severity: Secondary | ICD-10-CM | POA: Diagnosis not present

## 2011-07-25 DIAGNOSIS — E1149 Type 2 diabetes mellitus with other diabetic neurological complication: Secondary | ICD-10-CM | POA: Diagnosis not present

## 2011-07-25 DIAGNOSIS — I70269 Atherosclerosis of native arteries of extremities with gangrene, unspecified extremity: Secondary | ICD-10-CM | POA: Diagnosis not present

## 2011-07-26 ENCOUNTER — Encounter: Payer: Self-pay | Admitting: Cardiology

## 2011-07-26 DIAGNOSIS — E119 Type 2 diabetes mellitus without complications: Secondary | ICD-10-CM | POA: Diagnosis not present

## 2011-07-26 DIAGNOSIS — I10 Essential (primary) hypertension: Secondary | ICD-10-CM | POA: Diagnosis not present

## 2011-07-26 DIAGNOSIS — E7800 Pure hypercholesterolemia, unspecified: Secondary | ICD-10-CM | POA: Diagnosis not present

## 2011-08-01 ENCOUNTER — Ambulatory Visit: Payer: Medicare Other | Admitting: Cardiology

## 2011-08-06 DIAGNOSIS — D649 Anemia, unspecified: Secondary | ICD-10-CM | POA: Diagnosis not present

## 2011-08-06 DIAGNOSIS — E119 Type 2 diabetes mellitus without complications: Secondary | ICD-10-CM | POA: Diagnosis not present

## 2011-08-06 DIAGNOSIS — I739 Peripheral vascular disease, unspecified: Secondary | ICD-10-CM | POA: Diagnosis not present

## 2011-08-09 DIAGNOSIS — E1149 Type 2 diabetes mellitus with other diabetic neurological complication: Secondary | ICD-10-CM | POA: Diagnosis not present

## 2011-08-09 DIAGNOSIS — L97409 Non-pressure chronic ulcer of unspecified heel and midfoot with unspecified severity: Secondary | ICD-10-CM | POA: Diagnosis not present

## 2011-08-09 DIAGNOSIS — I70269 Atherosclerosis of native arteries of extremities with gangrene, unspecified extremity: Secondary | ICD-10-CM | POA: Diagnosis not present

## 2011-08-09 DIAGNOSIS — B351 Tinea unguium: Secondary | ICD-10-CM | POA: Diagnosis not present

## 2011-08-23 ENCOUNTER — Ambulatory Visit (INDEPENDENT_AMBULATORY_CARE_PROVIDER_SITE_OTHER): Payer: Medicare Other | Admitting: Cardiology

## 2011-08-23 ENCOUNTER — Ambulatory Visit (INDEPENDENT_AMBULATORY_CARE_PROVIDER_SITE_OTHER): Payer: Medicare Other | Admitting: *Deleted

## 2011-08-23 VITALS — BP 172/70 | HR 70 | Ht 65.0 in | Wt 123.0 lb

## 2011-08-23 DIAGNOSIS — Z7901 Long term (current) use of anticoagulants: Secondary | ICD-10-CM | POA: Diagnosis not present

## 2011-08-23 DIAGNOSIS — I4891 Unspecified atrial fibrillation: Secondary | ICD-10-CM | POA: Diagnosis not present

## 2011-08-23 DIAGNOSIS — I5022 Chronic systolic (congestive) heart failure: Secondary | ICD-10-CM

## 2011-08-23 DIAGNOSIS — L98499 Non-pressure chronic ulcer of skin of other sites with unspecified severity: Secondary | ICD-10-CM

## 2011-08-23 DIAGNOSIS — I6529 Occlusion and stenosis of unspecified carotid artery: Secondary | ICD-10-CM

## 2011-08-23 DIAGNOSIS — I739 Peripheral vascular disease, unspecified: Secondary | ICD-10-CM

## 2011-08-23 DIAGNOSIS — I5032 Chronic diastolic (congestive) heart failure: Secondary | ICD-10-CM

## 2011-08-23 DIAGNOSIS — R0602 Shortness of breath: Secondary | ICD-10-CM

## 2011-08-23 DIAGNOSIS — I509 Heart failure, unspecified: Secondary | ICD-10-CM

## 2011-08-23 DIAGNOSIS — I340 Nonrheumatic mitral (valve) insufficiency: Secondary | ICD-10-CM

## 2011-08-23 DIAGNOSIS — I059 Rheumatic mitral valve disease, unspecified: Secondary | ICD-10-CM

## 2011-08-23 DIAGNOSIS — I2510 Atherosclerotic heart disease of native coronary artery without angina pectoris: Secondary | ICD-10-CM | POA: Diagnosis not present

## 2011-08-23 DIAGNOSIS — I110 Hypertensive heart disease with heart failure: Secondary | ICD-10-CM

## 2011-08-23 DIAGNOSIS — I25810 Atherosclerosis of coronary artery bypass graft(s) without angina pectoris: Secondary | ICD-10-CM

## 2011-08-23 MED ORDER — LOSARTAN POTASSIUM 50 MG PO TABS: 50.0000 mg | ORAL_TABLET | Freq: Two times a day (BID) | ORAL | Status: DC

## 2011-08-23 NOTE — Patient Instructions (Addendum)
Increase losartan to 50mg  twice a day.   Your physician recommends that you return for lab work in: 2 weeks--BMET/BNP.  Your physician recommends that you schedule a follow-up appointment in: 3 months with Dr Shirlee Latch.

## 2011-08-26 ENCOUNTER — Encounter: Payer: Self-pay | Admitting: Cardiology

## 2011-08-26 NOTE — Assessment & Plan Note (Signed)
S/p AV nodal ablation with BIV-pacing.  No recent falls.  She is on coumadin with INR goal 2-2.5.  She has been using her cane.  She is not on aspirin.  If she has more falls, will stop coumadin and go on aspirin.  

## 2011-08-26 NOTE — Assessment & Plan Note (Addendum)
Repeat carotid dopplers in 7/13.

## 2011-08-26 NOTE — Assessment & Plan Note (Signed)
Below-the-knee PAD bilaterally with ulceration on left foot.  Followed by wound care and Dr. Arbie Cookey.

## 2011-08-26 NOTE — Assessment & Plan Note (Signed)
Last EF was 55%.  She is on Lasix and is near-euvolemic on exam.  NYHA class II symptoms.  For now, I will keep her on the same Lasix dose.

## 2011-08-26 NOTE — Assessment & Plan Note (Signed)
Stable s/p CABG and redo CABG with no ischemic symptoms.  Continue Coreg, ARB, and statin.  

## 2011-08-26 NOTE — Progress Notes (Signed)
PCP: Dr. Selena Batten  78 yo with a history of atrial fibrillation s/p AV nodal ablation, CAD s/p CABG, and ischemic cardiomyopathy presents for cardiology followup.  She had initial CABG in 1985 with redo in 1998. She developed an ischemic cardiomyopathy with EF 25%.  However, with biventricular pacing, her EF had improved to 55% on last echo. That echo did show severe mitral regurgitation.  Recently, she has developed a non-healing ulcer on the dorsum of her left foot.  She has had evaluation by Dr. Arbie Cookey and has been found to have significant below the knee PAD bilaterally.   Judith Zuniga has not been going out much for about 3 months because of the pain from the foot ulcer.  She is not short of breath walking around her house.  She denies claudication/leg or foot pain.  No chest pain. No orthopnea or PND.  She has wound care followup for the foot ulcer.  BP has been running high: 172/70 today and systolic typically in the 150s at home.     Labs (12/11): K 4.7, creatinine 1.4, HCT 31.6 Labs (8/12): LDL 75, HDL 52 Labs (9/12): K 4, creatinine 1.2, pro-BNP 200, LDL 75, HDL 52 Labs (4/13): K 4, creatinine 1.3, LDL 63, HDL 46  ECG: BiV-paced  PMH: 1. Atrial fibrillation: s/p AV nodal ablation 2. Ischemic CMP: EF as low as 25%.  After CRT, EF improved.  Last echo (7/11): EF 55%, severe central MR, severe TR, moderate biatrial enlargement.  3.  St. Jude CRT device 4. CAD: s/p CABG 1985 with LIMA-LAD, SVG-PDA, SVG-PLV. Redo CABG 1998 with RIMA-D1 and D2, SVG-PDA, SVG-PLV, SVG-CFX.   5. Hyperlipidemia 6. Peripheral neuropathy.  7. Mitral regurgitation: Severe by 7/11 echo.  Possibly due to annular dilatation.  8. Carotid stenosis: Dopplers (7/12) with 40-59% bilateral stenosis.  9. PAD: Chronic ulcer dorsum left foot.  Evaluation by Dr. Arbie Cookey in 4/13 with good circulation to the popliteal arteries but right TBI 0.35 and left TBI 0.32 (significant below the knee PAD.   SH: Lives alone in [place]. Retired.   Nonsmoker, no ETOH.    FH: Mother died at 57, h/o MI.  Father with probable sudden cardiac death at 48.   ROS: All systems reviewed and negative except as per HPI.    Current Outpatient Prescriptions  Medication Sig Dispense Refill  . acetaminophen (TYLENOL) 500 MG tablet Take 1,000 mg by mouth every 6 (six) hours as needed. For pain      . atorvastatin (LIPITOR) 40 MG tablet TAKE ONE TABLET BY MOUTH EVERY DAY  30 tablet  5  . carvedilol (COREG) 25 MG tablet Take 1 tablet (25 mg total) by mouth 2 (two) times daily.  60 tablet  5  . furosemide (LASIX) 20 MG tablet Take 1 tablet (20 mg total) by mouth daily.  30 tablet  11  . HYDROcodone-acetaminophen (NORCO) 5-325 MG per tablet Take 1 tablet by mouth every 6 (six) hours as needed.      . silver sulfADIAZINE (SILVADENE) 1 % cream Apply topically 2 (two) times daily.  50 g  0  . warfarin (COUMADIN) 5 MG tablet Take 5 mg by mouth daily.      Marland Kitchen losartan (COZAAR) 50 MG tablet Take 1 tablet (50 mg total) by mouth 2 (two) times daily.  60 tablet  6    BP 172/70  Pulse 70  Ht 5\' 5"  (1.651 m)  Wt 123 lb (55.792 kg)  BMI 20.47 kg/m2 General: NAD, elderly Neck:  JVP 7-8 cm, no thyromegaly or thyroid nodule.  Lungs: Clear to auscultation bilaterally with normal respiratory effort. CV: Nondisplaced PMI.  Heart regular S1/S2, no S3/S4, 2/6 HSM at apex.  1+ chronic edema to the knee on left, 1+ edema 1/2 to knee on right.  No carotid bruit.   Abdomen: Soft, nontender, no hepatosplenomegaly, no distention.  Neurologic: Alert and oriented x 3.  Psych: Normal affect. Extremities: No clubbing or cyanosis. Left foot wrapped.

## 2011-08-26 NOTE — Assessment & Plan Note (Signed)
Severe on last echo.  At 78 years old with 2 prior sternotomies, she will not be a candidate for repeat heart surgery. Will manage CHF medically. Continue losartan for afterload reduction (increasing today).

## 2011-08-26 NOTE — Assessment & Plan Note (Signed)
BP running high.  Will increase losartan to 50 mg bid today with BMET and BNP in 2 wks.

## 2011-09-02 ENCOUNTER — Other Ambulatory Visit (INDEPENDENT_AMBULATORY_CARE_PROVIDER_SITE_OTHER): Payer: Medicare Other

## 2011-09-02 DIAGNOSIS — R0602 Shortness of breath: Secondary | ICD-10-CM | POA: Diagnosis not present

## 2011-09-02 DIAGNOSIS — I5022 Chronic systolic (congestive) heart failure: Secondary | ICD-10-CM | POA: Diagnosis not present

## 2011-09-02 DIAGNOSIS — I70269 Atherosclerosis of native arteries of extremities with gangrene, unspecified extremity: Secondary | ICD-10-CM | POA: Diagnosis not present

## 2011-09-02 DIAGNOSIS — I2510 Atherosclerotic heart disease of native coronary artery without angina pectoris: Secondary | ICD-10-CM | POA: Diagnosis not present

## 2011-09-02 DIAGNOSIS — L97409 Non-pressure chronic ulcer of unspecified heel and midfoot with unspecified severity: Secondary | ICD-10-CM | POA: Diagnosis not present

## 2011-09-02 DIAGNOSIS — E1149 Type 2 diabetes mellitus with other diabetic neurological complication: Secondary | ICD-10-CM | POA: Diagnosis not present

## 2011-09-02 LAB — BASIC METABOLIC PANEL
BUN: 19 mg/dL (ref 6–23)
Calcium: 9.1 mg/dL (ref 8.4–10.5)
Creatinine, Ser: 1 mg/dL (ref 0.4–1.2)
GFR: 56.1 mL/min — ABNORMAL LOW (ref >60.00)
Glucose, Bld: 134 mg/dL — ABNORMAL HIGH (ref 70–99)
Sodium: 141 mEq/L (ref 135–145)

## 2011-09-06 ENCOUNTER — Encounter: Payer: Self-pay | Admitting: Internal Medicine

## 2011-09-06 DIAGNOSIS — I495 Sick sinus syndrome: Secondary | ICD-10-CM | POA: Diagnosis not present

## 2011-09-19 ENCOUNTER — Ambulatory Visit (INDEPENDENT_AMBULATORY_CARE_PROVIDER_SITE_OTHER): Payer: Medicare Other | Admitting: *Deleted

## 2011-09-19 DIAGNOSIS — I4891 Unspecified atrial fibrillation: Secondary | ICD-10-CM

## 2011-09-19 DIAGNOSIS — Z7901 Long term (current) use of anticoagulants: Secondary | ICD-10-CM | POA: Diagnosis not present

## 2011-09-19 LAB — PACEMAKER DEVICE OBSERVATION
BATTERY VOLTAGE: 2.78 V
DEVICE MODEL PM: 1989073
LV LEAD THRESHOLD: 1 V
RV LEAD IMPEDENCE PM: 481 Ohm
RV LEAD THRESHOLD: 1.25 V
VENTRICULAR PACING PM: 99

## 2011-09-19 NOTE — Progress Notes (Signed)
Pacer check in clinic  

## 2011-09-30 DIAGNOSIS — L97919 Non-pressure chronic ulcer of unspecified part of right lower leg with unspecified severity: Secondary | ICD-10-CM | POA: Diagnosis not present

## 2011-09-30 DIAGNOSIS — E1149 Type 2 diabetes mellitus with other diabetic neurological complication: Secondary | ICD-10-CM | POA: Diagnosis not present

## 2011-09-30 DIAGNOSIS — I70269 Atherosclerosis of native arteries of extremities with gangrene, unspecified extremity: Secondary | ICD-10-CM | POA: Diagnosis not present

## 2011-09-30 DIAGNOSIS — L97929 Non-pressure chronic ulcer of unspecified part of left lower leg with unspecified severity: Secondary | ICD-10-CM | POA: Diagnosis not present

## 2011-09-30 DIAGNOSIS — I83219 Varicose veins of right lower extremity with both ulcer of unspecified site and inflammation: Secondary | ICD-10-CM | POA: Diagnosis not present

## 2011-10-16 ENCOUNTER — Ambulatory Visit (INDEPENDENT_AMBULATORY_CARE_PROVIDER_SITE_OTHER): Payer: Medicare Other | Admitting: Pharmacist

## 2011-10-16 DIAGNOSIS — Z7901 Long term (current) use of anticoagulants: Secondary | ICD-10-CM

## 2011-10-16 DIAGNOSIS — I4891 Unspecified atrial fibrillation: Secondary | ICD-10-CM | POA: Diagnosis not present

## 2011-10-24 ENCOUNTER — Other Ambulatory Visit: Payer: Self-pay | Admitting: Cardiology

## 2011-10-30 ENCOUNTER — Ambulatory Visit (INDEPENDENT_AMBULATORY_CARE_PROVIDER_SITE_OTHER): Payer: Medicare Other | Admitting: *Deleted

## 2011-10-30 DIAGNOSIS — Z7901 Long term (current) use of anticoagulants: Secondary | ICD-10-CM

## 2011-10-30 DIAGNOSIS — I4891 Unspecified atrial fibrillation: Secondary | ICD-10-CM | POA: Diagnosis not present

## 2011-11-05 ENCOUNTER — Encounter: Payer: Self-pay | Admitting: Cardiology

## 2011-11-05 DIAGNOSIS — E119 Type 2 diabetes mellitus without complications: Secondary | ICD-10-CM | POA: Diagnosis not present

## 2011-11-05 DIAGNOSIS — D649 Anemia, unspecified: Secondary | ICD-10-CM | POA: Diagnosis not present

## 2011-11-08 DIAGNOSIS — R490 Dysphonia: Secondary | ICD-10-CM | POA: Diagnosis not present

## 2011-11-08 DIAGNOSIS — Z951 Presence of aortocoronary bypass graft: Secondary | ICD-10-CM | POA: Diagnosis not present

## 2011-11-08 DIAGNOSIS — R634 Abnormal weight loss: Secondary | ICD-10-CM | POA: Diagnosis not present

## 2011-11-08 DIAGNOSIS — R209 Unspecified disturbances of skin sensation: Secondary | ICD-10-CM | POA: Diagnosis not present

## 2011-11-11 ENCOUNTER — Ambulatory Visit (INDEPENDENT_AMBULATORY_CARE_PROVIDER_SITE_OTHER): Payer: Medicare Other | Admitting: *Deleted

## 2011-11-11 ENCOUNTER — Encounter (INDEPENDENT_AMBULATORY_CARE_PROVIDER_SITE_OTHER): Payer: Medicare Other

## 2011-11-11 ENCOUNTER — Other Ambulatory Visit: Payer: Self-pay | Admitting: Internal Medicine

## 2011-11-11 DIAGNOSIS — R634 Abnormal weight loss: Secondary | ICD-10-CM

## 2011-11-11 DIAGNOSIS — Z7901 Long term (current) use of anticoagulants: Secondary | ICD-10-CM

## 2011-11-11 DIAGNOSIS — R42 Dizziness and giddiness: Secondary | ICD-10-CM

## 2011-11-11 DIAGNOSIS — I4891 Unspecified atrial fibrillation: Secondary | ICD-10-CM | POA: Diagnosis not present

## 2011-11-11 DIAGNOSIS — I6529 Occlusion and stenosis of unspecified carotid artery: Secondary | ICD-10-CM

## 2011-11-11 LAB — POCT INR: INR: 2

## 2011-11-16 ENCOUNTER — Other Ambulatory Visit: Payer: Self-pay | Admitting: Cardiology

## 2011-11-18 DIAGNOSIS — H6120 Impacted cerumen, unspecified ear: Secondary | ICD-10-CM | POA: Diagnosis not present

## 2011-11-18 DIAGNOSIS — R490 Dysphonia: Secondary | ICD-10-CM | POA: Diagnosis not present

## 2011-11-19 ENCOUNTER — Ambulatory Visit
Admission: RE | Admit: 2011-11-19 | Discharge: 2011-11-19 | Disposition: A | Discharge status: Home / Self Care | Payer: Medicare Other | Source: Ambulatory Visit | Attending: Internal Medicine | Admitting: Internal Medicine

## 2011-11-19 DIAGNOSIS — R634 Abnormal weight loss: Secondary | ICD-10-CM | POA: Diagnosis not present

## 2011-11-19 DIAGNOSIS — R911 Solitary pulmonary nodule: Secondary | ICD-10-CM | POA: Diagnosis not present

## 2011-11-19 DIAGNOSIS — K8020 Calculus of gallbladder without cholecystitis without obstruction: Secondary | ICD-10-CM | POA: Diagnosis not present

## 2011-11-19 MED ORDER — IOHEXOL 300 MG/ML  SOLN
100.0000 mL | Freq: Once | INTRAMUSCULAR | Status: AC | PRN
  Administered 2011-11-19: 100 mL via INTRAVENOUS

## 2011-11-29 ENCOUNTER — Other Ambulatory Visit: Payer: Self-pay | Admitting: Cardiology

## 2011-12-02 ENCOUNTER — Encounter: Payer: Self-pay | Admitting: Cardiology

## 2011-12-02 ENCOUNTER — Ambulatory Visit (INDEPENDENT_AMBULATORY_CARE_PROVIDER_SITE_OTHER): Payer: Medicare Other | Admitting: *Deleted

## 2011-12-02 ENCOUNTER — Ambulatory Visit (INDEPENDENT_AMBULATORY_CARE_PROVIDER_SITE_OTHER): Payer: Medicare Other | Admitting: Cardiology

## 2011-12-02 VITALS — BP 182/82 | HR 70 | Ht 65.0 in | Wt 122.0 lb

## 2011-12-02 DIAGNOSIS — L98499 Non-pressure chronic ulcer of skin of other sites with unspecified severity: Secondary | ICD-10-CM | POA: Diagnosis not present

## 2011-12-02 DIAGNOSIS — I340 Nonrheumatic mitral (valve) insufficiency: Secondary | ICD-10-CM

## 2011-12-02 DIAGNOSIS — I5032 Chronic diastolic (congestive) heart failure: Secondary | ICD-10-CM | POA: Diagnosis not present

## 2011-12-02 DIAGNOSIS — I739 Peripheral vascular disease, unspecified: Secondary | ICD-10-CM

## 2011-12-02 DIAGNOSIS — Z7901 Long term (current) use of anticoagulants: Secondary | ICD-10-CM | POA: Diagnosis not present

## 2011-12-02 DIAGNOSIS — R0602 Shortness of breath: Secondary | ICD-10-CM | POA: Diagnosis not present

## 2011-12-02 DIAGNOSIS — I509 Heart failure, unspecified: Secondary | ICD-10-CM

## 2011-12-02 DIAGNOSIS — I059 Rheumatic mitral valve disease, unspecified: Secondary | ICD-10-CM

## 2011-12-02 DIAGNOSIS — I25810 Atherosclerosis of coronary artery bypass graft(s) without angina pectoris: Secondary | ICD-10-CM | POA: Diagnosis not present

## 2011-12-02 DIAGNOSIS — I110 Hypertensive heart disease with heart failure: Secondary | ICD-10-CM

## 2011-12-02 DIAGNOSIS — E785 Hyperlipidemia, unspecified: Secondary | ICD-10-CM

## 2011-12-02 DIAGNOSIS — I6529 Occlusion and stenosis of unspecified carotid artery: Secondary | ICD-10-CM

## 2011-12-02 DIAGNOSIS — I4891 Unspecified atrial fibrillation: Secondary | ICD-10-CM | POA: Diagnosis not present

## 2011-12-02 LAB — POCT INR: INR: 1.6

## 2011-12-02 MED ORDER — FUROSEMIDE 40 MG PO TABS: 40.0000 mg | ORAL_TABLET | Freq: Every day | ORAL | Status: DC

## 2011-12-02 NOTE — Assessment & Plan Note (Signed)
Stable s/p CABG and redo CABG with no ischemic symptoms.  Continue Coreg, ARB, and statin.

## 2011-12-02 NOTE — Progress Notes (Signed)
PCP: Dr. Selena Batten  78 yo with a history of atrial fibrillation s/p AV nodal ablation, CAD s/p CABG, and ischemic cardiomyopathy presents for cardiology followup.  She had initial CABG in 1985 with redo in 1998. She developed an ischemic cardiomyopathy with EF 25%.  However, with biventricular pacing, her EF had improved to 55% on last echo in 7/11. That echo did show severe mitral regurgitation.  She has had vascular evaluation by Dr. Arbie Cookey and has been found to have significant below the knee PAD bilaterally.  She had a left foot ulcer at last appointment which she says has subsequently healed.  She sees Dr. Arbie Cookey again next month. No claudication-type pain.   BP today is 182/90 but she has not yet taken her BP meds.  She denies exertional dyspnea though she is not very active.  No chest pain, orthopnea, or PND.      Labs (12/11): K 4.7, creatinine 1.4, HCT 31.6 Labs (8/12): LDL 75, HDL 52 Labs (9/12): K 4, creatinine 1.2, pro-BNP 200, LDL 75, HDL 52 Labs (4/13): K 4, creatinine 1.3, LDL 63, HDL 46  ECG: BiV-paced with atrial fibrillation  PMH: 1. Atrial fibrillation: s/p AV nodal ablation 2. Ischemic CMP: EF as low as 25%.  After CRT, EF improved.  Last echo (7/11): EF 55%, severe central MR, severe TR, moderate biatrial enlargement.  3.  St. Jude CRT device 4. CAD: s/p CABG 1985 with LIMA-LAD, SVG-PDA, SVG-PLV. Redo CABG 1998 with RIMA-D1 and D2, SVG-PDA, SVG-PLV, SVG-CFX.   5. Hyperlipidemia 6. Peripheral neuropathy.  7. Mitral regurgitation: Severe by 7/11 echo.  Possibly due to annular dilatation.  8. Carotid stenosis: Dopplers (7/12) with 40-59% bilateral stenosis.  Dopplers (7/13) with 40-59% bilateral stenosis.  9. PAD: Chronic ulcer dorsum left foot.  Evaluation by Dr. Arbie Cookey in 4/13 with good circulation to the popliteal arteries but right TBI 0.35 and left TBI 0.32 (significant below the knee PAD.   SH: Lives alone in [place]. Retired.  Nonsmoker, no ETOH.    FH: Mother died at 89,  h/o MI.  Father with probable sudden cardiac death at 56.   ROS: All systems reviewed and negative except as per HPI.    Current Outpatient Prescriptions  Medication Sig Dispense Refill  . acetaminophen (TYLENOL) 500 MG tablet Take 1,000 mg by mouth every 6 (six) hours as needed. For pain      . atorvastatin (LIPITOR) 40 MG tablet TAKE ONE TABLET BY MOUTH EVERY DAY  30 tablet  6  . carvedilol (COREG) 25 MG tablet TAKE ONE TABLET BY MOUTH TWICE DAILY  60 tablet  5  . HYDROcodone-acetaminophen (NORCO) 5-325 MG per tablet Take 1 tablet by mouth every 6 (six) hours as needed.      Marland Kitchen losartan (COZAAR) 50 MG tablet Take 1 tablet (50 mg total) by mouth 2 (two) times daily.  60 tablet  6  . warfarin (COUMADIN) 5 MG tablet Take 5 mg by mouth daily.      Marland Kitchen warfarin (COUMADIN) 5 MG tablet       . warfarin (COUMADIN) 5 MG tablet TAKE ONE TABLET BY MOUTH EVERY DAY AS DIRECTED  90 tablet  1  . DISCONTD: furosemide (LASIX) 20 MG tablet Take 1 tablet (20 mg total) by mouth daily.  30 tablet  11  . DISCONTD: furosemide (LASIX) 20 MG tablet Take 20 mg by mouth daily.      . furosemide (LASIX) 40 MG tablet Take 1 tablet (40 mg total) by mouth  daily.  30 tablet  6    BP 182/82  Pulse 70  Ht 5\' 5"  (1.651 m)  Wt 122 lb (55.339 kg)  BMI 20.30 kg/m2 General: NAD, elderly Neck: JVP 8-9 cm, no thyromegaly or thyroid nodule.  Lungs: Clear to auscultation bilaterally with normal respiratory effort. CV: Nondisplaced PMI.  Heart regular S1/S2, no S3/S4, 3/6 HSM at apex.  1+ right ankle edema, wearing compression stocking on left leg.  No carotid bruit.   Abdomen: Soft, nontender, no hepatosplenomegaly, no distention.  Neurologic: Alert and oriented x 3.  Psych: Normal affect. Extremities: No clubbing or cyanosis.

## 2011-12-02 NOTE — Assessment & Plan Note (Addendum)
Patient is not very active and does not seem to have significant exertional symptoms.  However, she does appear more volume overloaded on exam with elevated JVP.  I am going to have her increase Lasix to 40 mg daily with BMET and BNP in 10 days.  I am going to get an echo to make sure that EF remains normal.

## 2011-12-02 NOTE — Patient Instructions (Addendum)
Increase lasix(furosemide) to 40mg  daily. You can take two 20mg  tablets daily at the same time and use your current supply.  Take and record your blood pressure daily. I will call you in 2 weeks to get the readings. Luana Shu [phone]  Your physician recommends that you return for a FASTING lipid profile /BMET/BNP in about 10 days.  Your physician has requested that you have an echocardiogram. Echocardiography is a painless test that uses sound waves to create images of your heart. It provides your doctor with information about the size and shape of your heart and how well your heart's chambers and valves are working. This procedure takes approximately one hour. There are no restrictions for this procedure. In 10 days when you have the fasting lab.  Your physician recommends that you schedule a follow-up appointment in: 3 months with Dr Shirlee Latch.

## 2011-12-02 NOTE — Assessment & Plan Note (Signed)
BP is high but she has not taken her meds today.  I will have her check her BP daily at home for 2 wks and we will call her to see what her numbers run when she is taking her medications.

## 2011-12-02 NOTE — Assessment & Plan Note (Signed)
Severe on last echo.  At 77 years old with 2 prior sternotomies, she will not be a candidate for repeat heart surgery. Will manage CHF medically. Continue losartan for afterload reduction.  Needs good blood pressure control to try to limit mitral regurgitation.

## 2011-12-02 NOTE — Assessment & Plan Note (Signed)
Below-the-knee PAD bilaterally with resolving ulceration on left foot.  Followed by wound care and Dr. Arbie Cookey.

## 2011-12-02 NOTE — Assessment & Plan Note (Signed)
I will check lipids with goal LDL < 70.  

## 2011-12-02 NOTE — Assessment & Plan Note (Signed)
Stable mild to moderate disease on dopplers in 7/13.  Repeat carotid dopplers in 7/14.

## 2011-12-02 NOTE — Assessment & Plan Note (Signed)
S/p AV nodal ablation with BIV-pacing.  No recent falls.  She is on coumadin with INR goal 2-2.5.  She has been using her cane.  She is not on aspirin.  If she has more falls, will stop coumadin and go on aspirin.

## 2011-12-06 DIAGNOSIS — E119 Type 2 diabetes mellitus without complications: Secondary | ICD-10-CM | POA: Diagnosis not present

## 2011-12-06 DIAGNOSIS — D649 Anemia, unspecified: Secondary | ICD-10-CM | POA: Diagnosis not present

## 2011-12-06 DIAGNOSIS — E7800 Pure hypercholesterolemia, unspecified: Secondary | ICD-10-CM | POA: Diagnosis not present

## 2011-12-06 DIAGNOSIS — I10 Essential (primary) hypertension: Secondary | ICD-10-CM | POA: Diagnosis not present

## 2011-12-08 DIAGNOSIS — I495 Sick sinus syndrome: Secondary | ICD-10-CM | POA: Diagnosis not present

## 2011-12-12 ENCOUNTER — Other Ambulatory Visit (INDEPENDENT_AMBULATORY_CARE_PROVIDER_SITE_OTHER): Payer: Medicare Other

## 2011-12-12 ENCOUNTER — Other Ambulatory Visit: Payer: Self-pay

## 2011-12-12 ENCOUNTER — Ambulatory Visit (HOSPITAL_COMMUNITY): Payer: Medicare Other | Attending: Cardiology

## 2011-12-12 DIAGNOSIS — I079 Rheumatic tricuspid valve disease, unspecified: Secondary | ICD-10-CM | POA: Insufficient documentation

## 2011-12-12 DIAGNOSIS — I4891 Unspecified atrial fibrillation: Secondary | ICD-10-CM

## 2011-12-12 DIAGNOSIS — I509 Heart failure, unspecified: Secondary | ICD-10-CM | POA: Insufficient documentation

## 2011-12-12 DIAGNOSIS — I5032 Chronic diastolic (congestive) heart failure: Secondary | ICD-10-CM | POA: Diagnosis not present

## 2011-12-12 DIAGNOSIS — I080 Rheumatic disorders of both mitral and aortic valves: Secondary | ICD-10-CM | POA: Insufficient documentation

## 2011-12-12 DIAGNOSIS — R0602 Shortness of breath: Secondary | ICD-10-CM | POA: Diagnosis not present

## 2011-12-12 DIAGNOSIS — I739 Peripheral vascular disease, unspecified: Secondary | ICD-10-CM | POA: Insufficient documentation

## 2011-12-12 DIAGNOSIS — I10 Essential (primary) hypertension: Secondary | ICD-10-CM | POA: Diagnosis not present

## 2011-12-12 DIAGNOSIS — I2589 Other forms of chronic ischemic heart disease: Secondary | ICD-10-CM | POA: Diagnosis not present

## 2011-12-12 LAB — LIPID PANEL
Cholesterol: 146 mg/dL (ref 0–200)
Total CHOL/HDL Ratio: 3
Triglycerides: 67 mg/dL (ref 0.0–149.0)

## 2011-12-12 LAB — BASIC METABOLIC PANEL
Calcium: 9.5 mg/dL (ref 8.4–10.5)
Creatinine, Ser: 1.3 mg/dL — ABNORMAL HIGH (ref 0.4–1.2)

## 2011-12-12 LAB — BRAIN NATRIURETIC PEPTIDE: Pro B Natriuretic peptide (BNP): 275 pg/mL — ABNORMAL HIGH (ref 0.0–100.0)

## 2011-12-12 NOTE — Progress Notes (Signed)
Echocardiogram performed.  

## 2011-12-13 ENCOUNTER — Ambulatory Visit (INDEPENDENT_AMBULATORY_CARE_PROVIDER_SITE_OTHER): Payer: Medicare Other | Admitting: Emergency Medicine

## 2011-12-13 ENCOUNTER — Encounter: Payer: Self-pay | Admitting: Emergency Medicine

## 2011-12-13 VITALS — BP 132/60 | HR 77 | Temp 96.8°F | Ht 63.0 in | Wt 115.0 lb

## 2011-12-13 DIAGNOSIS — R918 Other nonspecific abnormal finding of lung field: Secondary | ICD-10-CM | POA: Insufficient documentation

## 2011-12-13 NOTE — Patient Instructions (Addendum)
Your CT scan of the chest shows some inflammatory changes in your right lung. This could represent an atypical mycobacterial infection or colonization. Because you are not having symptoms right now, we do not need to treat this. If symptome develop, however, we may decide to repeat your CT scan or even start antibiotics.  Please follow with Dr Delton Coombes to discuss if you develop any symptoms.

## 2011-12-13 NOTE — Assessment & Plan Note (Signed)
Micronodular disease in the RUL and RLL. 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 Doubt responsible for her wt loss given paucity if CT findings and absence of sx. I wouldn't put her through the treatment for [facility] at this time. I explained colonization to her, asked her to speak w Dr Selena Batten if she develops cough, more wt loss, etc so that we can revisit.

## 2011-12-13 NOTE — Progress Notes (Signed)
Subjective:    Patient ID: Judith Zuniga, female    DOB: [birth year], 78 y.o.   MRN: [id number]  HPI 78 yo woman never smoker with hx CAD.CABG, cardiomyopathy w biV pacer, A fib s/p nodal ablation, DM. She underwent CT scan of the chest, abd, pelvis on 8/6 as part of w/u for unexplained wt loss. The scan shows micronodular disease in the RUL and RLL.  No cough, no dyspnea, no CP.    Review of Systems  Constitutional: Positive for unexpected weight change. Negative for fever.  HENT: Positive for nosebleeds. Negative for ear pain, congestion, sore throat, rhinorrhea, sneezing, trouble swallowing, dental problem, postnasal drip and sinus pressure.   Eyes: Negative for redness and itching.  Respiratory: Negative for cough, chest tightness, shortness of breath and wheezing.   Cardiovascular: Negative for palpitations and leg swelling.  Gastrointestinal: Negative for nausea and vomiting.  Genitourinary: Negative for dysuria.  Musculoskeletal: Negative for joint swelling.  Skin: Negative for rash.  Neurological: Negative for headaches.  Hematological: Bruises/bleeds easily.  Psychiatric/Behavioral: Negative for dysphoric mood. The patient is not nervous/anxious.    Past Medical History  Diagnosis Date  . S/P ablation of ventricular arrhythmia     AV Node (s/p)  . Pacemaker     BiV (s/p) and s/p atrioventricular nodal ablation with no implantable cardioverter-defibrillatior  . Type II or unspecified type diabetes mellitus without mention of complication, not stated as uncontrolled   . Cardiac pacemaker in situ   . CAD (coronary artery disease)     s/p redo bypass surgery in 1998 with patent graft in Jan. 2009.   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 Systolic heart failure     class II and euvolemic  . Chronic atrial fibrillation   . Hyperlipidemia   . Diabetes mellitus   . Premature ventricular contractions     frequent  . Elevated liver function tests       on amiodarone  . Hypertension      Family History  Problem Relation Age of Onset  . Heart disease Father   . Other Mother     pacemaker  . Cancer Sister     gallbladder     History   Social History  . Marital Status: Widowed    Spouse Name: N/A    Number of Children: 1  . Years of Education: N/A   Occupational History  . Retired    Social History Main Topics  . Smoking status: Never Smoker   . Smokeless tobacco: Never Used  . Alcohol Use: No  . Drug Use: No  . Sexually Active: Not on file   Other Topics Concern  . 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      No Known Allergies   Outpatient Prescriptions Prior to Visit  Medication Sig Dispense Refill  . atorvastatin (LIPITOR) 40 MG tablet TAKE ONE TABLET BY MOUTH EVERY DAY  30 tablet  6  . carvedilol (COREG) 25 MG tablet TAKE ONE TABLET BY MOUTH TWICE DAILY  60 tablet  5  . furosemide (LASIX) 40 MG tablet Take 1 tablet (40 mg total) by mouth daily.  30 tablet  6  . HYDROcodone-acetaminophen (NORCO) 5-325 MG per tablet Take 1 tablet by mouth every 6 (six) hours as needed.      . warfarin (COUMADIN) 5 MG  tablet TAKE ONE TABLET BY MOUTH EVERY DAY AS DIRECTED  90 tablet  1  . losartan (COZAAR) 50 MG tablet Take 1 tablet (50 mg total) by mouth 2 (two) times daily.  60 tablet  6  . warfarin (COUMADIN) 5 MG tablet Take 5 mg by mouth daily.      Marland Kitchen warfarin (COUMADIN) 5 MG tablet       . acetaminophen (TYLENOL) 500 MG tablet Take 1,000 mg by mouth every 6 (six) hours as needed. For pain             Objective:   Physical Exam  Gen: Pleasant, thin woman, in no distress,  normal affect, using a cane  ENT: No lesions,  mouth clear,  oropharynx clear, no postnasal drip  Neck: No JVD, no TMG, no carotid bruits  Lungs: No use of accessory muscles, no dullness to percussion, clear without rales or  rhonchi  Cardiovascular: RRR, heart sounds normal, no murmur or gallops, no peripheral edema  Musculoskeletal: No deformities, no cyanosis or clubbing  Neuro: alert, non focal  Skin: Warm, no lesions or rashes   CT CHEST 11/19/11 --  Findings: There is peribronchovascular nodularity within the  lateral right upper lobe peripherally. This is nonspecific but can  be seen with atypical infection and bronchiolitis such as  Mycobacterium avium complex. Within the right lower lobe  peripherally there is some nodularity present as well. No other  evidence of Mycobacterium avium complex is seen. No bronchiectasis  is noted. No pleural effusion is noted.  On soft tissue window images, the thyroid gland contains a few  areas of calcification. A permanent pacemaker is imbedded within  the left upper chest. Median sternotomy sutures are noted from  prior CABG. No mediastinal or hilar adenopathy is seen. The  largest mediastinal node is precarinal measuring 10 mm in short  axis diameter. There is a thoracic kyphosis present with  degenerative change in the thoracic spine.  IMPRESSION:  Peribronchovascular nodularity within the right upper lobe with  some nodularity in the right lower lobe suggestive of atypical  infection such as possibly Mycobacterium avium complex. Clinical  correlation is recommended.      Assessment & Plan:  Multiple pulmonary nodules Micronodular disease in the RUL and RLL. 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 Doubt responsible for her wt loss given paucity if CT findings and absence of sx. I wouldn't put her through the treatment for [facility] at this time. I explained colonization to her, asked her to speak w Dr Selena Batten if she develops cough, more wt loss, etc so that we can revisit.

## 2011-12-17 ENCOUNTER — Other Ambulatory Visit: Payer: Self-pay | Admitting: *Deleted

## 2011-12-17 DIAGNOSIS — I5032 Chronic diastolic (congestive) heart failure: Secondary | ICD-10-CM

## 2011-12-18 ENCOUNTER — Ambulatory Visit (INDEPENDENT_AMBULATORY_CARE_PROVIDER_SITE_OTHER): Payer: Medicare Other | Admitting: Pharmacist

## 2011-12-18 DIAGNOSIS — I4891 Unspecified atrial fibrillation: Secondary | ICD-10-CM

## 2011-12-18 DIAGNOSIS — Z7901 Long term (current) use of anticoagulants: Secondary | ICD-10-CM | POA: Diagnosis not present

## 2011-12-19 ENCOUNTER — Telehealth: Payer: Self-pay | Admitting: *Deleted

## 2011-12-19 NOTE — Telephone Encounter (Signed)
HYPERTENSION, HEART CONTROLLED W/ CHF - Marca Ancona, MD 12/02/2011 5:33 PM Signed  BP is high but she has not taken her meds today. I will have her check her BP daily at home for 2 wks and we will call her to see what her numbers run when she is taking her medications.   12/19/11--spoke with pt. Pt states she does not have her BP log with her. She has checked her BP regularly and it has been in the 130/140-70/80 range.

## 2011-12-27 ENCOUNTER — Other Ambulatory Visit (INDEPENDENT_AMBULATORY_CARE_PROVIDER_SITE_OTHER): Payer: Medicare Other

## 2011-12-27 DIAGNOSIS — I5032 Chronic diastolic (congestive) heart failure: Secondary | ICD-10-CM

## 2011-12-27 LAB — BASIC METABOLIC PANEL
BUN: 41 mg/dL — ABNORMAL HIGH (ref 6–23)
Calcium: 9.4 mg/dL (ref 8.4–10.5)
Creatinine, Ser: 1.3 mg/dL — ABNORMAL HIGH (ref 0.4–1.2)
GFR: 41.67 mL/min — ABNORMAL LOW (ref >60.00)
Glucose, Bld: 132 mg/dL — ABNORMAL HIGH (ref 70–99)
Potassium: 3.8 mEq/L (ref 3.5–5.1)

## 2011-12-30 DIAGNOSIS — L03039 Cellulitis of unspecified toe: Secondary | ICD-10-CM | POA: Diagnosis not present

## 2011-12-30 DIAGNOSIS — E1149 Type 2 diabetes mellitus with other diabetic neurological complication: Secondary | ICD-10-CM | POA: Diagnosis not present

## 2012-01-10 ENCOUNTER — Ambulatory Visit (INDEPENDENT_AMBULATORY_CARE_PROVIDER_SITE_OTHER): Payer: Medicare Other

## 2012-01-10 DIAGNOSIS — Z7901 Long term (current) use of anticoagulants: Secondary | ICD-10-CM | POA: Diagnosis not present

## 2012-01-10 DIAGNOSIS — I4891 Unspecified atrial fibrillation: Secondary | ICD-10-CM

## 2012-01-10 LAB — POCT INR: INR: 2.2

## 2012-01-14 DIAGNOSIS — B351 Tinea unguium: Secondary | ICD-10-CM | POA: Diagnosis not present

## 2012-01-14 DIAGNOSIS — M79609 Pain in unspecified limb: Secondary | ICD-10-CM | POA: Diagnosis not present

## 2012-01-20 DIAGNOSIS — E1149 Type 2 diabetes mellitus with other diabetic neurological complication: Secondary | ICD-10-CM | POA: Diagnosis not present

## 2012-01-20 DIAGNOSIS — L03039 Cellulitis of unspecified toe: Secondary | ICD-10-CM | POA: Diagnosis not present

## 2012-01-20 DIAGNOSIS — L97929 Non-pressure chronic ulcer of unspecified part of left lower leg with unspecified severity: Secondary | ICD-10-CM | POA: Diagnosis not present

## 2012-01-20 DIAGNOSIS — I83219 Varicose veins of right lower extremity with both ulcer of unspecified site and inflammation: Secondary | ICD-10-CM | POA: Diagnosis not present

## 2012-02-04 DIAGNOSIS — E119 Type 2 diabetes mellitus without complications: Secondary | ICD-10-CM | POA: Diagnosis not present

## 2012-02-07 ENCOUNTER — Ambulatory Visit (INDEPENDENT_AMBULATORY_CARE_PROVIDER_SITE_OTHER): Payer: Medicare Other

## 2012-02-07 DIAGNOSIS — Z23 Encounter for immunization: Secondary | ICD-10-CM | POA: Diagnosis not present

## 2012-02-07 DIAGNOSIS — Z7901 Long term (current) use of anticoagulants: Secondary | ICD-10-CM

## 2012-02-07 DIAGNOSIS — I4891 Unspecified atrial fibrillation: Secondary | ICD-10-CM

## 2012-03-04 ENCOUNTER — Encounter: Payer: Self-pay | Admitting: Cardiology

## 2012-03-04 ENCOUNTER — Ambulatory Visit (INDEPENDENT_AMBULATORY_CARE_PROVIDER_SITE_OTHER): Payer: Medicare Other | Admitting: Cardiology

## 2012-03-04 VITALS — BP 152/68 | HR 70 | Ht 63.0 in | Wt 119.0 lb

## 2012-03-04 DIAGNOSIS — I4891 Unspecified atrial fibrillation: Secondary | ICD-10-CM

## 2012-03-04 DIAGNOSIS — I509 Heart failure, unspecified: Secondary | ICD-10-CM

## 2012-03-04 DIAGNOSIS — I5032 Chronic diastolic (congestive) heart failure: Secondary | ICD-10-CM

## 2012-03-04 DIAGNOSIS — I6529 Occlusion and stenosis of unspecified carotid artery: Secondary | ICD-10-CM

## 2012-03-04 DIAGNOSIS — I110 Hypertensive heart disease with heart failure: Secondary | ICD-10-CM

## 2012-03-04 DIAGNOSIS — R0602 Shortness of breath: Secondary | ICD-10-CM

## 2012-03-04 DIAGNOSIS — I340 Nonrheumatic mitral (valve) insufficiency: Secondary | ICD-10-CM

## 2012-03-04 DIAGNOSIS — I059 Rheumatic mitral valve disease, unspecified: Secondary | ICD-10-CM

## 2012-03-04 DIAGNOSIS — I5022 Chronic systolic (congestive) heart failure: Secondary | ICD-10-CM

## 2012-03-04 DIAGNOSIS — L98499 Non-pressure chronic ulcer of skin of other sites with unspecified severity: Secondary | ICD-10-CM | POA: Diagnosis not present

## 2012-03-04 DIAGNOSIS — E785 Hyperlipidemia, unspecified: Secondary | ICD-10-CM

## 2012-03-04 DIAGNOSIS — I25810 Atherosclerosis of coronary artery bypass graft(s) without angina pectoris: Secondary | ICD-10-CM | POA: Diagnosis not present

## 2012-03-04 DIAGNOSIS — I739 Peripheral vascular disease, unspecified: Secondary | ICD-10-CM

## 2012-03-04 LAB — BASIC METABOLIC PANEL
BUN: 22 mg/dL (ref 6–23)
Calcium: 8.8 mg/dL (ref 8.4–10.5)
Creatinine, Ser: 0.9 mg/dL (ref 0.4–1.2)
GFR: 66.81 mL/min (ref >60.00)
Glucose, Bld: 161 mg/dL — ABNORMAL HIGH (ref 70–99)

## 2012-03-04 NOTE — Patient Instructions (Addendum)
Your physician recommends that you have lab work today--BMET/BNP.  Your physician recommends that you schedule a follow-up appointment in: 3 months with Dr McLean.     

## 2012-03-04 NOTE — Progress Notes (Signed)
Patient ID: Judith Zuniga, female   DOB: 02/18/1921, 78 y.o.   MRN: [id number] PCP: Dr. Selena Batten  78 yo with a history of atrial fibrillation s/p AV nodal ablation, CAD s/p CABG, and ischemic cardiomyopathy presents for cardiology followup.  She had initial CABG in 1985 with redo in 1998. She developed an ischemic cardiomyopathy with EF 25%.  However, with biventricular pacing, her EF had improved to 50-55% on last echo in 8/13. That echo did show moderte to severe mitral regurgitation and severe TR.  She has had vascular evaluation by Dr. Arbie Cookey and has been found to have significant below the knee PAD bilaterally.  She has a chronic left foot ulcer and gets wound care with Dr. Lajoyce Corners.  She says that Dr. Lajoyce Corners has told her that the ulcer is healing appropriately.  At last appointment, I increased her Lasix to 40 mg daily due to volume overload, then cut it back to 40 daily alternating with 20 daily due to elevation in BUN/creatinine.   She denies exertional dyspnea when walking.  She uses her cane for balance.  She lives alone and functions independently.  She does the housework like vacuuming and goes shopping without problems.  She has not fallen down recently.  She did have what sounds like a hypoglycemic episode about a week ago where she got shaky and fell against a door, cutting her forearm.  She did not fall to the ground or hit her head.  No chest pain. BP today is 152/68.   Labs (12/11): K 4.7, creatinine 1.4, HCT 31.6 Labs (8/12): LDL 75, HDL 52 Labs (9/12): K 4, creatinine 1.2, pro-BNP 200, LDL 75, HDL 52 Labs (4/13): K 4, creatinine 1.3, LDL 63 Labs (8/13): LDL 75, HDL 57, BNP 275 Labs (9/13): K 3.8, creatinine 1.3  ECG: BiV-paced with atrial fibrillation  PMH: 1. Atrial fibrillation: s/p AV nodal ablation 2. Ischemic CMP: EF as low as 25%.  After CRT, EF improved.  Last echo (8/13): EF 50-55%, moderate to severe central MR, severe TR, PA systolic pressure 49 mmHg.  3.  St. Jude CRT device 4.  CAD: s/p CABG 1985 with LIMA-LAD, SVG-PDA, SVG-PLV. Redo CABG 1998 with RIMA-D1 and D2, SVG-PDA, SVG-PLV, SVG-CFX.   5. Hyperlipidemia 6. Peripheral neuropathy.  7. Mitral regurgitation: Moderate to severe by 8/13 echo.  Possibly due to annular dilatation.  8. Carotid stenosis: Dopplers (7/12) with 40-59% bilateral stenosis.  Dopplers (7/13) with 40-59% bilateral stenosis.  9. PAD: Chronic ulcer dorsum left foot.  Evaluation by Dr. Arbie Cookey in 4/13 with good circulation to the popliteal arteries but right TBI 0.35 and left TBI 0.32 (significant below the knee PAD).  10. 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   SH: Lives alone in [place]. Retired.  Nonsmoker, no ETOH.    FH: Mother died at 63, h/o MI.  Father with probable sudden cardiac death at 35.   ROS: All systems reviewed and negative except as per HPI.    Current Outpatient Prescriptions  Medication Sig Dispense Refill  . atorvastatin (LIPITOR) 40 MG tablet TAKE ONE TABLET BY MOUTH EVERY DAY  30 tablet  6  . carvedilol (COREG) 25 MG tablet TAKE ONE TABLET BY MOUTH TWICE DAILY  60 tablet  5  . furosemide (LASIX) 40 MG tablet 20 mg alternating with 40mg  daily 12/17/11.      Marland Kitchen HYDROcodone-acetaminophen (NORCO) 5-325 MG per tablet Take 1 tablet by mouth every 6 (six) hours as needed.      Marland Kitchen  losartan (COZAAR) 50 MG tablet Take 100 mg by mouth daily.      Marland Kitchen warfarin (COUMADIN) 5 MG tablet TAKE ONE TABLET BY MOUTH EVERY DAY AS DIRECTED  90 tablet  1    BP 152/68  Pulse 70  Ht 5\' 3"  (1.6 m)  Wt 119 lb (53.978 kg)  BMI 21.08 kg/m2 General: NAD, elderly Neck: JVP 8-9 cm, no thyromegaly or thyroid nodule.  Lungs: Clear to auscultation bilaterally with normal respiratory effort. CV: Nondisplaced PMI.  Heart regular S1/S2, no S3/S4, 3/6 HSM at apex/LLSB.  Trace ankle edema bilaterally.  No carotid bruit.   Abdomen: Soft, nontender, no hepatosplenomegaly, no distention.  Neurologic: Alert and oriented x 3.    Psych: Normal affect. Extremities: No clubbing or cyanosis.  Assessment/Plan:  ATRIAL FIBRILLATION S/p AV nodal ablation with BIV-pacing. No recent falls to the ground. She is on coumadin with INR goal 2-2.5. She has been using her cane. She is not on aspirin. If she has more falls, would consider stopping coumadin.  Atherosclerosis of native arteries of the extremities with ulceration  Below-the-knee PAD bilaterally with resolving ulceration on left foot. Followed by wound care and Dr. Arbie Cookey.  CAD, ARTERY BYPASS GRAFT Stable s/p CABG and redo CABG with no ischemic symptoms. Continue Coreg, ARB, and statin.  CAROTID ARTERY DISEASE  Stable mild to moderate disease on dopplers in 7/13. Repeat carotid dopplers in 7/14.  Chronic diastolic heart failure  Patient is not very active and does not seem to have significant exertional dyspnea.  Weight is down 3 lbs since last appointment (taking higher dose of Lasix).  JVP remains mildly elevated.  I am going to keep her on her current Lasix regimen of 40 daily alternating with 20 daily.  Will need to check BMET/BNP today.  HYPERLIPIDEMIA-MIXED  Lipids at goal (LDL < 70) in 9/13.   HYPERTENSION, HEART CONTROLLED W/ CHF  BP mildly elevated but will tolerate SBP 150 or less (want to avoid making her lightheaded due to fall risk).  Mitral regurgitation  Moderate to severe on last echo. At 78 years old with 2 prior sternotomies, she will not be a candidate for repeat heart surgery. Will manage CHF medically. Continue losartan for afterload reduction.  Judith Zuniga 03/04/2012 11:34 AM

## 2012-03-18 DIAGNOSIS — E7800 Pure hypercholesterolemia, unspecified: Secondary | ICD-10-CM | POA: Diagnosis not present

## 2012-03-18 DIAGNOSIS — E119 Type 2 diabetes mellitus without complications: Secondary | ICD-10-CM | POA: Diagnosis not present

## 2012-03-18 DIAGNOSIS — I10 Essential (primary) hypertension: Secondary | ICD-10-CM | POA: Diagnosis not present

## 2012-03-20 ENCOUNTER — Ambulatory Visit (INDEPENDENT_AMBULATORY_CARE_PROVIDER_SITE_OTHER): Payer: Medicare Other

## 2012-03-20 DIAGNOSIS — Z7901 Long term (current) use of anticoagulants: Secondary | ICD-10-CM | POA: Diagnosis not present

## 2012-03-20 DIAGNOSIS — I4891 Unspecified atrial fibrillation: Secondary | ICD-10-CM | POA: Diagnosis not present

## 2012-03-23 DIAGNOSIS — I10 Essential (primary) hypertension: Secondary | ICD-10-CM | POA: Diagnosis not present

## 2012-03-23 DIAGNOSIS — E119 Type 2 diabetes mellitus without complications: Secondary | ICD-10-CM | POA: Diagnosis not present

## 2012-03-23 DIAGNOSIS — D649 Anemia, unspecified: Secondary | ICD-10-CM | POA: Diagnosis not present

## 2012-03-23 DIAGNOSIS — E7800 Pure hypercholesterolemia, unspecified: Secondary | ICD-10-CM | POA: Diagnosis not present

## 2012-04-02 DIAGNOSIS — D485 Neoplasm of uncertain behavior of skin: Secondary | ICD-10-CM | POA: Diagnosis not present

## 2012-04-02 DIAGNOSIS — L820 Inflamed seborrheic keratosis: Secondary | ICD-10-CM | POA: Diagnosis not present

## 2012-04-21 ENCOUNTER — Encounter: Payer: Medicare Other | Admitting: Internal Medicine

## 2012-04-21 NOTE — Progress Notes (Deleted)
Patient Care Team: Massie Maroon, MD as PCP - General Nadara Mustard, MD as Attending Physician (Orthopedic Surgery) Laurey Morale, MD (Cardiology)   HPI  Judith Zuniga is a 79 y.o. female following CRT implantation with AV junction ablation  for uncontrolled atrial arrhythmias. This was done in January.2009. This is associated with marked improvement in exercise tolerance. Echo cardiogram 2011 demonstrated improvement in left ventricular function  Left ventricle: Systolic function was normal. The estimated  ejection fraction was 55%. Wall motion was normal; there were no  regional wall motion abnormalities.  - Mitral valve: Calcified annulus. Mildly thickened leaflets .  Severe regurgitation directed centrally.Left atrium: moderately dilated.  - Right atrium:mildly to moderately dilated.  - Tricuspid valve: Severe regurgitation.  Currently she is Struggling with a upper respiratory infection   Past Medical History  Diagnosis Date  . S/P ablation of ventricular arrhythmia     AV Node (s/p)  . Pacemaker     BiV (s/p) and s/p atrioventricular nodal ablation with no implantable cardioverter-defibrillatior  . Type II or unspecified type diabetes mellitus without mention of complication, not stated as uncontrolled   . Cardiac pacemaker in situ   . CAD (coronary artery disease)     s/p redo bypass surgery in 1998 with patent graft in Jan. 2009.   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 Systolic heart failure     class II and euvolemic  . Chronic atrial fibrillation   . Hyperlipidemia   . Diabetes mellitus   . Premature ventricular contractions     frequent  . Elevated liver function tests     on amiodarone  . Hypertension     Past Surgical History  Procedure Date  . Coronary artery bypass graft 1998    CABG x5: RIMA to diagonal one, RIMA to diagonal 2, SVG to PD, SVG to PL, SVG to circ  . Coronary artery bypass graft 1985    x3: LIMA to LAD,  SVG to PD and SVG to PL  . Cardiac caths     multile  . Pacer generator change out     St. Jude pacemaker    Current Outpatient Prescriptions  Medication Sig Dispense Refill  . atorvastatin (LIPITOR) 40 MG tablet TAKE ONE TABLET BY MOUTH EVERY DAY  30 tablet  6  . carvedilol (COREG) 25 MG tablet TAKE ONE TABLET BY MOUTH TWICE DAILY  60 tablet  5  . furosemide (LASIX) 40 MG tablet 20 mg alternating with 40mg  daily 12/17/11.      Marland Kitchen HYDROcodone-acetaminophen (NORCO) 5-325 MG per tablet Take 1 tablet by mouth every 6 (six) hours as needed.      Marland Kitchen losartan (COZAAR) 50 MG tablet Take 100 mg by mouth daily.      Marland Kitchen warfarin (COUMADIN) 5 MG tablet TAKE ONE TABLET BY MOUTH EVERY DAY AS DIRECTED  90 tablet  1    No Known Allergies  Review of Systems negative except from HPI and PMH  Physical Exam There were no vitals taken for this visit. Well developed and well nourished in no acute distress HENT normal E scleral and icterus clear Neck Supple JVP flat; carotids brisk and full Clear to ausculation {CARD RHYTHM:10874} ***Regular rate and rhythm, no murmurs gallops or rub Soft with active bowel sounds No clubbing cyanosis {Numbers; edema:17696} Edema Alert and oriented, grossly normal motor and sensory function Skin Warm and Dry    Assessment and  Plan

## 2012-04-29 ENCOUNTER — Encounter: Payer: Self-pay | Admitting: Internal Medicine

## 2012-04-29 ENCOUNTER — Ambulatory Visit (INDEPENDENT_AMBULATORY_CARE_PROVIDER_SITE_OTHER): Payer: Medicare Other | Admitting: *Deleted

## 2012-04-29 DIAGNOSIS — Z7901 Long term (current) use of anticoagulants: Secondary | ICD-10-CM | POA: Diagnosis not present

## 2012-04-29 DIAGNOSIS — I4891 Unspecified atrial fibrillation: Secondary | ICD-10-CM

## 2012-04-29 LAB — POCT INR: INR: 1.9

## 2012-05-12 DIAGNOSIS — B351 Tinea unguium: Secondary | ICD-10-CM | POA: Diagnosis not present

## 2012-05-12 DIAGNOSIS — M79609 Pain in unspecified limb: Secondary | ICD-10-CM | POA: Diagnosis not present

## 2012-05-26 ENCOUNTER — Encounter: Payer: Self-pay | Admitting: Internal Medicine

## 2012-05-26 ENCOUNTER — Ambulatory Visit (INDEPENDENT_AMBULATORY_CARE_PROVIDER_SITE_OTHER): Payer: Medicare Other | Admitting: Internal Medicine

## 2012-05-26 ENCOUNTER — Ambulatory Visit (INDEPENDENT_AMBULATORY_CARE_PROVIDER_SITE_OTHER): Payer: Medicare Other

## 2012-05-26 VITALS — BP 140/70 | HR 70 | Ht 63.0 in | Wt 121.0 lb

## 2012-05-26 DIAGNOSIS — I110 Hypertensive heart disease with heart failure: Secondary | ICD-10-CM

## 2012-05-26 DIAGNOSIS — I4891 Unspecified atrial fibrillation: Secondary | ICD-10-CM

## 2012-05-26 DIAGNOSIS — Z950 Presence of cardiac pacemaker: Secondary | ICD-10-CM | POA: Diagnosis not present

## 2012-05-26 DIAGNOSIS — I509 Heart failure, unspecified: Secondary | ICD-10-CM | POA: Diagnosis not present

## 2012-05-26 DIAGNOSIS — I5032 Chronic diastolic (congestive) heart failure: Secondary | ICD-10-CM

## 2012-05-26 DIAGNOSIS — Z7901 Long term (current) use of anticoagulants: Secondary | ICD-10-CM

## 2012-05-26 LAB — PACEMAKER DEVICE OBSERVATION
DEVICE MODEL PM: 1989073
LV LEAD THRESHOLD: 0.75 V
RV LEAD THRESHOLD: 1.25 V

## 2012-05-26 LAB — POCT INR: INR: 3.3

## 2012-05-26 NOTE — Assessment & Plan Note (Signed)
permanet  On anticoagulation

## 2012-05-26 NOTE — Assessment & Plan Note (Addendum)
Her blood pressure is well-controlled. She is taking losartan 50 mg. We will renew at this does. BUN and creatinine were normal in November 2013

## 2012-05-26 NOTE — Assessment & Plan Note (Signed)
Euvolemic. Continue current medications 

## 2012-05-26 NOTE — Assessment & Plan Note (Signed)
The patient's device was interrogated.  The information was reviewed. No changes were made in the programming.    

## 2012-05-26 NOTE — Progress Notes (Signed)
Patient Care Team: Massie Maroon, MD as PCP - General Nadara Mustard, MD as Attending Physician (Orthopedic Surgery) Laurey Morale, MD (Cardiology)   HPI  Judith Zuniga is a 79 y.o. female following CRT implantation with AV junction ablation  for uncontrolled atrial arrhythmias. This was done in January.2009. This is associated with marked improvement in exercise tolerance   a. Echo cardiogram 2011 demonstrated improvement in left ventricular function; she reportedly had severe bilateral   AV valvular regurgitation.  The patient denies chest pain, shortness of breath, nocturnal dyspnea, orthopnea or peripheral edema.  There have been no palpitations, lightheadedness or syncope.     Past Medical History  Diagnosis Date  . Atrioventricular block, complete s/p AV ablation   . Pacemaker -CRT- St Judes   . Type II or unspecified type diabetes mellitus without mention of complication, not stated as uncontrolled   . CAD (coronary artery disease)     s/p redo bypass surgery in 1998 with patent graft in Jan. 2009.   . Ischemic cardiomyopathy     ischemic heart myopathy , ejection fraction of 25%  . Systolic heart failure     class II and euvolemic  . Permanent atrial fibrillation   . Hyperlipidemia   . Diabetes mellitus   . Premature ventricular contractions     frequent  . Elevated liver function tests     on amiodarone  . Hypertension     Past Surgical History  Procedure Laterality Date  . Coronary artery bypass graft  1998    CABG x5: RIMA to diagonal one, RIMA to diagonal 2, SVG to PD, SVG to PL, SVG to circ  . Coronary artery bypass graft  1985    x3: LIMA to LAD, SVG to PD and SVG to PL  . Cardiac caths      multile  . Pacer generator change out      St. Jude pacemaker    Current Outpatient Prescriptions  Medication Sig Dispense Refill  . atorvastatin (LIPITOR) 40 MG tablet TAKE ONE TABLET BY MOUTH EVERY DAY  30 tablet  6  . carvedilol (COREG) 25 MG tablet TAKE  ONE TABLET BY MOUTH TWICE DAILY  60 tablet  5  . furosemide (LASIX) 40 MG tablet 20 mg alternating with 40mg  daily 12/17/11.      Marland Kitchen HYDROcodone-acetaminophen (NORCO) 5-325 MG per tablet Take 1 tablet by mouth every 6 (six) hours as needed.      Marland Kitchen losartan (COZAAR) 50 MG tablet Take 100 mg by mouth daily.      Marland Kitchen warfarin (COUMADIN) 5 MG tablet TAKE ONE TABLET BY MOUTH EVERY DAY AS DIRECTED  90 tablet  1   No current facility-administered medications for this visit.    No Known Allergies  Review of Systems negative except from HPI and PMH  Physical Exam BP 140/70  Pulse 70  Ht 5\' 3"  (1.6 m)  Wt 121 lb (54.885 kg)  BMI 21.44 kg/m2  SpO2 99% Well developed and well nourished in no acute distress HENT normal E scleral and icterus clear Neck Supple JVP flat; carotids brisk and full Clear to ausculation  *Regular rate and rhythm, 2/6 murmur along the right sternal border Soft with active bowel sounds No clubbing cyanosis none Edema Alert and oriented, grossly normal motor and sensory function Skin Warm and Dry    Assessment and  Plan

## 2012-05-26 NOTE — Patient Instructions (Addendum)
Your physician wants you to follow-up in: August with Dr. Shirlee Latch & 1 year with Dr. Graciela Husbands. You will receive a reminder letter in the mail two months in advance. If you don't receive a letter, please call our office to schedule the follow-up appointment.  Your physician recommends that you continue on your current medications as directed. Please refer to the Current Medication list given to you today.

## 2012-06-01 ENCOUNTER — Encounter: Payer: Self-pay | Admitting: Internal Medicine

## 2012-06-02 ENCOUNTER — Other Ambulatory Visit: Payer: Self-pay | Admitting: Cardiology

## 2012-06-03 ENCOUNTER — Encounter: Payer: Medicare Other | Admitting: Internal Medicine

## 2012-06-05 ENCOUNTER — Telehealth: Payer: Self-pay | Admitting: Cardiology

## 2012-06-05 NOTE — Telephone Encounter (Signed)
Pt calling re appt with mclean on 2-25, she thought it was cxl and we would send a card to remind her of her next appt, I think she gotten that confused with klein's appt, but she wanted to make sure, pls call

## 2012-06-05 NOTE — Telephone Encounter (Signed)
Pt is correct - per Dr Odessa  note she is to see Dr Shirlee Latch in August and SCK back in 05/2013.  Recall placed for Mclean appt.  Recall for SCK was already in the system.

## 2012-06-09 ENCOUNTER — Ambulatory Visit: Payer: Medicare Other | Admitting: Cardiology

## 2012-06-10 ENCOUNTER — Ambulatory Visit (INDEPENDENT_AMBULATORY_CARE_PROVIDER_SITE_OTHER): Payer: Medicare Other | Admitting: *Deleted

## 2012-06-10 DIAGNOSIS — I4891 Unspecified atrial fibrillation: Secondary | ICD-10-CM

## 2012-06-10 DIAGNOSIS — Z7901 Long term (current) use of anticoagulants: Secondary | ICD-10-CM | POA: Diagnosis not present

## 2012-06-10 DIAGNOSIS — I495 Sick sinus syndrome: Secondary | ICD-10-CM | POA: Diagnosis not present

## 2012-06-10 LAB — POCT INR: INR: 1.6

## 2012-06-24 ENCOUNTER — Ambulatory Visit (INDEPENDENT_AMBULATORY_CARE_PROVIDER_SITE_OTHER): Payer: Medicare Other | Admitting: *Deleted

## 2012-06-24 DIAGNOSIS — Z7901 Long term (current) use of anticoagulants: Secondary | ICD-10-CM

## 2012-06-24 DIAGNOSIS — I4891 Unspecified atrial fibrillation: Secondary | ICD-10-CM | POA: Diagnosis not present

## 2012-06-24 LAB — POCT INR: INR: 1.8

## 2012-07-07 DIAGNOSIS — M25569 Pain in unspecified knee: Secondary | ICD-10-CM | POA: Diagnosis not present

## 2012-07-07 DIAGNOSIS — M79609 Pain in unspecified limb: Secondary | ICD-10-CM | POA: Diagnosis not present

## 2012-07-08 ENCOUNTER — Ambulatory Visit (INDEPENDENT_AMBULATORY_CARE_PROVIDER_SITE_OTHER): Payer: Medicare Other | Admitting: *Deleted

## 2012-07-08 DIAGNOSIS — I4891 Unspecified atrial fibrillation: Secondary | ICD-10-CM | POA: Diagnosis not present

## 2012-07-08 DIAGNOSIS — Z7901 Long term (current) use of anticoagulants: Secondary | ICD-10-CM

## 2012-07-08 LAB — POCT INR: INR: 2.2

## 2012-07-11 ENCOUNTER — Other Ambulatory Visit: Payer: Self-pay | Admitting: Cardiology

## 2012-07-22 ENCOUNTER — Ambulatory Visit (INDEPENDENT_AMBULATORY_CARE_PROVIDER_SITE_OTHER): Payer: Medicare Other | Admitting: Pharmacist

## 2012-07-22 DIAGNOSIS — I4891 Unspecified atrial fibrillation: Secondary | ICD-10-CM

## 2012-07-22 DIAGNOSIS — Z7901 Long term (current) use of anticoagulants: Secondary | ICD-10-CM

## 2012-08-04 DIAGNOSIS — R490 Dysphonia: Secondary | ICD-10-CM | POA: Diagnosis not present

## 2012-08-04 DIAGNOSIS — R42 Dizziness and giddiness: Secondary | ICD-10-CM | POA: Diagnosis not present

## 2012-08-05 ENCOUNTER — Ambulatory Visit (INDEPENDENT_AMBULATORY_CARE_PROVIDER_SITE_OTHER): Payer: Medicare Other | Admitting: *Deleted

## 2012-08-05 DIAGNOSIS — I4891 Unspecified atrial fibrillation: Secondary | ICD-10-CM | POA: Diagnosis not present

## 2012-08-05 DIAGNOSIS — Z7901 Long term (current) use of anticoagulants: Secondary | ICD-10-CM | POA: Diagnosis not present

## 2012-08-05 LAB — POCT INR: INR: 2.3

## 2012-08-11 DIAGNOSIS — B351 Tinea unguium: Secondary | ICD-10-CM | POA: Diagnosis not present

## 2012-08-11 DIAGNOSIS — M79609 Pain in unspecified limb: Secondary | ICD-10-CM | POA: Diagnosis not present

## 2012-08-19 ENCOUNTER — Ambulatory Visit (INDEPENDENT_AMBULATORY_CARE_PROVIDER_SITE_OTHER): Payer: Medicare Other | Admitting: *Deleted

## 2012-08-19 DIAGNOSIS — Z7901 Long term (current) use of anticoagulants: Secondary | ICD-10-CM | POA: Diagnosis not present

## 2012-08-19 DIAGNOSIS — I4891 Unspecified atrial fibrillation: Secondary | ICD-10-CM | POA: Diagnosis not present

## 2012-08-25 ENCOUNTER — Other Ambulatory Visit: Payer: Self-pay | Admitting: Cardiology

## 2012-08-26 ENCOUNTER — Other Ambulatory Visit: Payer: Self-pay | Admitting: Cardiology

## 2012-08-31 ENCOUNTER — Ambulatory Visit (INDEPENDENT_AMBULATORY_CARE_PROVIDER_SITE_OTHER): Payer: Medicare Other | Admitting: *Deleted

## 2012-08-31 DIAGNOSIS — I4891 Unspecified atrial fibrillation: Secondary | ICD-10-CM | POA: Diagnosis not present

## 2012-08-31 DIAGNOSIS — Z7901 Long term (current) use of anticoagulants: Secondary | ICD-10-CM

## 2012-09-10 ENCOUNTER — Ambulatory Visit (INDEPENDENT_AMBULATORY_CARE_PROVIDER_SITE_OTHER): Payer: Medicare Other | Admitting: Cardiology

## 2012-09-10 ENCOUNTER — Encounter: Payer: Self-pay | Admitting: Cardiology

## 2012-09-10 ENCOUNTER — Ambulatory Visit (INDEPENDENT_AMBULATORY_CARE_PROVIDER_SITE_OTHER): Payer: Medicare Other | Admitting: *Deleted

## 2012-09-10 VITALS — BP 142/64 | HR 71 | Ht 63.0 in | Wt 123.0 lb

## 2012-09-10 DIAGNOSIS — Z7901 Long term (current) use of anticoagulants: Secondary | ICD-10-CM | POA: Diagnosis not present

## 2012-09-10 DIAGNOSIS — R0602 Shortness of breath: Secondary | ICD-10-CM

## 2012-09-10 DIAGNOSIS — I6529 Occlusion and stenosis of unspecified carotid artery: Secondary | ICD-10-CM | POA: Diagnosis not present

## 2012-09-10 DIAGNOSIS — I25810 Atherosclerosis of coronary artery bypass graft(s) without angina pectoris: Secondary | ICD-10-CM | POA: Diagnosis not present

## 2012-09-10 DIAGNOSIS — I4891 Unspecified atrial fibrillation: Secondary | ICD-10-CM

## 2012-09-10 DIAGNOSIS — I5032 Chronic diastolic (congestive) heart failure: Secondary | ICD-10-CM | POA: Diagnosis not present

## 2012-09-10 DIAGNOSIS — E785 Hyperlipidemia, unspecified: Secondary | ICD-10-CM

## 2012-09-10 DIAGNOSIS — I509 Heart failure, unspecified: Secondary | ICD-10-CM

## 2012-09-10 LAB — POCT INR: INR: 2.1

## 2012-09-10 NOTE — Patient Instructions (Addendum)
Start lasix(furosemide) 20 mg daily.  Your physician recommends that you return for lab work in: 1 week--BMET/BNP/Lipid profile/liver profile.  Your physician has requested that you have a carotid duplex. This test is an ultrasound of the carotid arteries in your neck. It looks at blood flow through these arteries that supply the brain with blood. Allow one hour for this exam. There are no restrictions or special instructions. July 2014  Your physician recommends that you schedule a follow-up appointment in: 4 months with Dr Shirlee Latch.

## 2012-09-11 NOTE — Progress Notes (Signed)
Patient ID: Judith Zuniga, female   DOB: 1921/01/18, 79 y.o.   MRN: [id number] PCP: Dr. Selena Batten  79 yo with a history of atrial fibrillation s/p AV nodal ablation, CAD s/p CABG, and ischemic cardiomyopathy presents for cardiology followup.  She had initial CABG in 1985 with redo in 1998. She developed an ischemic cardiomyopathy with EF 25%.  However, with biventricular pacing, her EF had improved to 50-55% on last echo in 8/13. That echo did show moderate to severe mitral regurgitation and severe TR.  She has had vascular evaluation by Dr. Arbie Cookey and has been found to have significant below the knee PAD bilaterally.    She lives alone and functions independently.  She does the housework like vacuuming and goes shopping without problems.  She has not fallen recently.  No chest pain.  She denies exertional dyspnea or orthopnea.  She has had a left foot ulcer related to PAD that has been healing.  She has been taking all her medications except for Lasix.   Labs (12/11): K 4.7, creatinine 1.4, HCT 31.6 Labs (8/12): LDL 75, HDL 52 Labs (9/12): K 4, creatinine 1.2, pro-BNP 200, LDL 75, HDL 52 Labs (4/13): K 4, creatinine 1.3, LDL 63 Labs (8/13): LDL 75, HDL 57, BNP 275 Labs (9/13): K 3.8, creatinine 1.3 Labs (11/13): K 3.9, creatinine 0.9, BNP 365  ECG: BiV-paced with atrial fibrillation  PMH: 1. Atrial fibrillation: s/p AV nodal ablation 2. Ischemic CMP: EF as low as 25%.  After CRT, EF improved.  Last echo (8/13): EF 50-55%, moderate to severe central MR, severe TR, PA systolic pressure 49 mmHg.  3.  St. Jude CRT device 4. CAD: s/p CABG 1985 with LIMA-LAD, SVG-PDA, SVG-PLV. Redo CABG 1998 with RIMA-D1 and D2, SVG-PDA, SVG-PLV, SVG-CFX.   5. Hyperlipidemia 6. Peripheral neuropathy.  7. Mitral regurgitation: Moderate to severe by 8/13 echo.  Possibly due to annular dilatation.  8. Carotid stenosis: Dopplers (7/12) with 40-59% bilateral stenosis.  Dopplers (7/13) with 40-59% bilateral stenosis.  9. PAD:  Chronic ulcer dorsum left foot.  Evaluation by Dr. Arbie Cookey in 4/13 with good circulation to the popliteal arteries but right TBI 0.35 and left TBI 0.32 (significant below the knee PAD).  10. 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   SH: Lives alone in [place]. Retired.  Nonsmoker, no ETOH.    FH: Mother died at 46, h/o MI.  Father with probable sudden cardiac death at 32.   ROS: All systems reviewed and negative except as per HPI.    Current Outpatient Prescriptions  Medication Sig Dispense Refill  . atorvastatin (LIPITOR) 40 MG tablet TAKE ONE TABLET BY MOUTH EVERY DAY  30 tablet  4  . carvedilol (COREG) 25 MG tablet TAKE ONE TABLET BY MOUTH TWICE DAILY  60 tablet  4  . HYDROcodone-acetaminophen (NORCO) 5-325 MG per tablet Take 1 tablet by mouth every 6 (six) hours as needed.      Marland Kitchen losartan (COZAAR) 50 MG tablet Take 50 mg by mouth daily.       Marland Kitchen warfarin (COUMADIN) 5 MG tablet TAKE AS DIRECTED BY COUMADIN CLINIC  100 tablet  1  . furosemide (LASIX) 20 MG tablet Take 1 tablet (20 mg total) by mouth daily.       No current facility-administered medications for this visit.    BP 142/64  Pulse 71  Ht 5\' 3"  (1.6 m)  Wt 123 lb (55.792 kg)  BMI 21.79 kg/m2 General: NAD, elderly Neck: JVP 8-9 cm,  no thyromegaly or thyroid nodule.  Lungs: Clear to auscultation bilaterally with normal respiratory effort. CV: Nondisplaced PMI.  Heart regular S1/S2, no S3/S4, 3/6 HSM at apex/LLSB.  1+ ankle edema bilaterally.  No carotid bruit.   Abdomen: Soft, nontender, no hepatosplenomegaly, no distention.  Neurologic: Alert and oriented x 3.  Psych: Normal affect. Extremities: No clubbing or cyanosis.  Assessment/Plan:  ATRIAL FIBRILLATION S/p AV nodal ablation with BIV-pacing. No recent falls. She is on coumadin with INR goal 2-2.5. She has been using her cane. She is not on aspirin.  Atherosclerosis of native arteries of the extremities with ulceration   Below-the-knee PAD bilaterally with resolving ulceration on left foot. Followed by wound care and Dr. Arbie Cookey.  CAD, ARTERY BYPASS GRAFT Stable s/p CABG and redo CABG with no ischemic symptoms. Continue Coreg, ARB, and statin. She is not on aspirin given warfarin use with stable CAD.  CAROTID ARTERY DISEASE  Stable mild to moderate disease on dopplers in 7/13. Repeat carotid dopplers in 7/14.  Chronic diastolic heart failure  Patient is not very active and does not seem to have significant exertional dyspnea.  However, she does look volume overloaded on exam.  She has not been taking Lasix.  I would like her to restart on Lasix 20 mg daily with BMET/BNP in a week. HYPERLIPIDEMIA-MIXED  Check lipids with labs in 1 week.   HYPERTENSION BP mildly elevated but will tolerate SBP 150 or less (want to avoid making her lightheaded due to fall risk).  Mitral regurgitation  Moderate to severe on last echo. At 79 years old with 2 prior sternotomies, she will not be a candidate for repeat heart surgery. Will manage CHF medically. Continue losartan for afterload reduction.  Judith Zuniga 09/11/2012

## 2012-09-14 NOTE — Addendum Note (Signed)
Addended by: Jacqlyn Krauss on: 09/14/2012 05:15 PM   Modules accepted: Orders

## 2012-09-17 ENCOUNTER — Other Ambulatory Visit (INDEPENDENT_AMBULATORY_CARE_PROVIDER_SITE_OTHER): Payer: Medicare Other

## 2012-09-17 DIAGNOSIS — I5032 Chronic diastolic (congestive) heart failure: Secondary | ICD-10-CM

## 2012-09-17 DIAGNOSIS — R0602 Shortness of breath: Secondary | ICD-10-CM

## 2012-09-17 LAB — LIPID PANEL
Cholesterol: 155 mg/dL (ref 0–200)
VLDL: 7.4 mg/dL (ref 0.0–40.0)

## 2012-09-17 LAB — BRAIN NATRIURETIC PEPTIDE: Pro B Natriuretic peptide (BNP): 262 pg/mL — ABNORMAL HIGH (ref 0.0–100.0)

## 2012-09-17 LAB — HEPATIC FUNCTION PANEL
ALT: 24 U/L (ref 0–35)
AST: 29 U/L (ref 0–37)
Alkaline Phosphatase: 47 U/L (ref 39–117)
Bilirubin, Direct: 0.1 mg/dL (ref 0.0–0.3)
Total Bilirubin: 0.7 mg/dL (ref 0.3–1.2)
Total Protein: 6.8 g/dL (ref 6.0–8.3)

## 2012-09-17 LAB — BASIC METABOLIC PANEL
BUN: 37 mg/dL — ABNORMAL HIGH (ref 6–23)
Calcium: 9.4 mg/dL (ref 8.4–10.5)
Creatinine, Ser: 1 mg/dL (ref 0.4–1.2)
GFR: 56.63 mL/min — ABNORMAL LOW (ref >60.00)
Glucose, Bld: 119 mg/dL — ABNORMAL HIGH (ref 70–99)

## 2012-09-30 ENCOUNTER — Ambulatory Visit (INDEPENDENT_AMBULATORY_CARE_PROVIDER_SITE_OTHER): Payer: Medicare Other | Admitting: *Deleted

## 2012-09-30 DIAGNOSIS — I4891 Unspecified atrial fibrillation: Secondary | ICD-10-CM

## 2012-09-30 DIAGNOSIS — Z7901 Long term (current) use of anticoagulants: Secondary | ICD-10-CM | POA: Diagnosis not present

## 2012-09-30 NOTE — Patient Instructions (Signed)
Reinforced if your have a fall and hit your HEAD call 911 and go to ED,

## 2012-10-01 ENCOUNTER — Encounter: Payer: Self-pay | Admitting: Cardiology

## 2012-10-01 DIAGNOSIS — E119 Type 2 diabetes mellitus without complications: Secondary | ICD-10-CM | POA: Diagnosis not present

## 2012-10-01 DIAGNOSIS — I10 Essential (primary) hypertension: Secondary | ICD-10-CM | POA: Diagnosis not present

## 2012-10-07 DIAGNOSIS — E7800 Pure hypercholesterolemia, unspecified: Secondary | ICD-10-CM | POA: Diagnosis not present

## 2012-10-07 DIAGNOSIS — E119 Type 2 diabetes mellitus without complications: Secondary | ICD-10-CM | POA: Diagnosis not present

## 2012-10-07 DIAGNOSIS — D649 Anemia, unspecified: Secondary | ICD-10-CM | POA: Diagnosis not present

## 2012-10-07 DIAGNOSIS — I10 Essential (primary) hypertension: Secondary | ICD-10-CM | POA: Diagnosis not present

## 2012-10-07 DIAGNOSIS — I4891 Unspecified atrial fibrillation: Secondary | ICD-10-CM | POA: Diagnosis not present

## 2012-10-29 ENCOUNTER — Encounter (INDEPENDENT_AMBULATORY_CARE_PROVIDER_SITE_OTHER): Payer: Medicare Other

## 2012-10-29 ENCOUNTER — Ambulatory Visit (INDEPENDENT_AMBULATORY_CARE_PROVIDER_SITE_OTHER): Payer: Medicare Other | Admitting: *Deleted

## 2012-10-29 DIAGNOSIS — R42 Dizziness and giddiness: Secondary | ICD-10-CM

## 2012-10-29 DIAGNOSIS — I4891 Unspecified atrial fibrillation: Secondary | ICD-10-CM | POA: Diagnosis not present

## 2012-10-29 DIAGNOSIS — Z7901 Long term (current) use of anticoagulants: Secondary | ICD-10-CM | POA: Diagnosis not present

## 2012-10-29 DIAGNOSIS — R0602 Shortness of breath: Secondary | ICD-10-CM

## 2012-10-29 DIAGNOSIS — I5032 Chronic diastolic (congestive) heart failure: Secondary | ICD-10-CM

## 2012-10-29 DIAGNOSIS — I6529 Occlusion and stenosis of unspecified carotid artery: Secondary | ICD-10-CM

## 2012-11-10 DIAGNOSIS — M79609 Pain in unspecified limb: Secondary | ICD-10-CM | POA: Diagnosis not present

## 2012-11-10 DIAGNOSIS — B351 Tinea unguium: Secondary | ICD-10-CM | POA: Diagnosis not present

## 2012-11-26 ENCOUNTER — Ambulatory Visit (INDEPENDENT_AMBULATORY_CARE_PROVIDER_SITE_OTHER): Payer: Medicare Other | Admitting: *Deleted

## 2012-11-26 DIAGNOSIS — Z7901 Long term (current) use of anticoagulants: Secondary | ICD-10-CM

## 2012-11-26 DIAGNOSIS — I4891 Unspecified atrial fibrillation: Secondary | ICD-10-CM | POA: Diagnosis not present

## 2012-11-26 LAB — POCT INR: INR: 2.3

## 2013-01-07 ENCOUNTER — Encounter: Payer: Self-pay | Admitting: Cardiology

## 2013-01-07 ENCOUNTER — Ambulatory Visit (INDEPENDENT_AMBULATORY_CARE_PROVIDER_SITE_OTHER): Payer: Medicare Other | Admitting: Cardiology

## 2013-01-07 ENCOUNTER — Ambulatory Visit (INDEPENDENT_AMBULATORY_CARE_PROVIDER_SITE_OTHER): Payer: Medicare Other | Admitting: *Deleted

## 2013-01-07 VITALS — BP 142/79 | HR 70 | Wt 123.0 lb

## 2013-01-07 DIAGNOSIS — E785 Hyperlipidemia, unspecified: Secondary | ICD-10-CM

## 2013-01-07 DIAGNOSIS — I059 Rheumatic mitral valve disease, unspecified: Secondary | ICD-10-CM

## 2013-01-07 DIAGNOSIS — I5032 Chronic diastolic (congestive) heart failure: Secondary | ICD-10-CM | POA: Diagnosis not present

## 2013-01-07 DIAGNOSIS — Z7901 Long term (current) use of anticoagulants: Secondary | ICD-10-CM

## 2013-01-07 DIAGNOSIS — I4891 Unspecified atrial fibrillation: Secondary | ICD-10-CM

## 2013-01-07 DIAGNOSIS — I110 Hypertensive heart disease with heart failure: Secondary | ICD-10-CM | POA: Diagnosis not present

## 2013-01-07 DIAGNOSIS — R0602 Shortness of breath: Secondary | ICD-10-CM

## 2013-01-07 DIAGNOSIS — I340 Nonrheumatic mitral (valve) insufficiency: Secondary | ICD-10-CM

## 2013-01-07 DIAGNOSIS — I25810 Atherosclerosis of coronary artery bypass graft(s) without angina pectoris: Secondary | ICD-10-CM

## 2013-01-07 DIAGNOSIS — I6529 Occlusion and stenosis of unspecified carotid artery: Secondary | ICD-10-CM

## 2013-01-07 DIAGNOSIS — I509 Heart failure, unspecified: Secondary | ICD-10-CM

## 2013-01-07 MED ORDER — POTASSIUM CHLORIDE ER 10 MEQ PO TBCR: 10.0000 meq | EXTENDED_RELEASE_TABLET | Freq: Every day | ORAL | Status: DC

## 2013-01-07 MED ORDER — FUROSEMIDE 20 MG PO TABS: ORAL_TABLET | ORAL | Status: DC

## 2013-01-07 NOTE — Patient Instructions (Addendum)
  Medication changes:  Take Furosemide ( lasix) 40mg  once a day for a week                                                                               THEN:  decrease furosemide to  20mg  daily ( 1/2 tablet)                                           START: Potassium 10 meq 1 tablet daily  Return to our office in 1 week for lab work: bmp, bnp  You will also receive a reminder to schedule follow-up appointment with Dr. Graciela Husbands in February 2015  Your physician recommends that you schedule a follow-up appointment in: 1 month With Dr. Shirlee Latch

## 2013-01-09 NOTE — Progress Notes (Signed)
Patient ID: Judith Zuniga, female   DOB: Oct 17, 1920, 79 y.o.   MRN: [id number] PCP: Dr. Selena Batten  79 yo with a history of atrial fibrillation s/p AV nodal ablation, CAD s/p CABG, and ischemic cardiomyopathy presents for cardiology followup.  She had initial CABG in 1985 with redo in 1998. She developed an ischemic cardiomyopathy with EF 25%.  However, with biventricular pacing, her EF had improved to 50-55% on last echo in 8/13. That echo did show moderate to severe mitral regurgitation and severe TR.  She has had vascular evaluation by Dr. Arbie Cookey and has been found to have significant below the knee PAD bilaterally.    She lives alone and functions independently.  She has had a left foot ulcer related to PAD that has mostly healed.  She is short of breath with sweeping or other heavier housework.  She is short of breath when she walks further than just room to room in her house.  No orthopnea, PND, or chest pain.  She has not been taking Lasix.  She had similar symptoms at last appointment and I asked her to take her Lasix, but she apparently only took it for a brief period then stopped it.    Labs (12/11): K 4.7, creatinine 1.4, HCT 31.6 Labs (8/12): LDL 75, HDL 52 Labs (9/12): K 4, creatinine 1.2, pro-BNP 200, LDL 75, HDL 52 Labs (4/13): K 4, creatinine 1.3, LDL 63 Labs (8/13): LDL 75, HDL 57, BNP 275 Labs (9/13): K 3.8, creatinine 1.3 Labs (11/13): K 3.9, creatinine 0.9, BNP 365 Labs (6/14): K 4.3, creatinine 1.0, LDL 90, HDL 57  ECG: BiV-paced with atrial fibrillation  PMH: 1. Atrial fibrillation: s/p AV nodal ablation 2. Ischemic CMP: EF as low as 25%.  After CRT, EF improved.  Last echo (8/13): EF 50-55%, moderate to severe central MR, severe TR, PA systolic pressure 49 mmHg.  3.  St. Jude CRT device 4. CAD: s/p CABG 1985 with LIMA-LAD, SVG-PDA, SVG-PLV. Redo CABG 1998 with RIMA-D1 and D2, SVG-PDA, SVG-PLV, SVG-CFX.   5. Hyperlipidemia 6. Peripheral neuropathy.  7. Mitral regurgitation:  Moderate to severe by 8/13 echo.  Possibly due to annular dilatation.  8. Carotid stenosis: Dopplers (7/12) with 40-59% bilateral stenosis.  Dopplers (7/13) with 40-59% bilateral stenosis.  Dopplers (7/14) with 40-59% bilateral stenosis.  9. PAD: Chronic ulcer dorsum left foot (now mostly healed).  Evaluation by Dr. Arbie Cookey in 4/13 with good circulation to the popliteal arteries but right TBI 0.35 and left TBI 0.32 (significant below the knee PAD).  10. 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   SH: Lives alone in [place]. Retired.  Nonsmoker, no ETOH.    FH: Mother died at 57, h/o MI.  Father with probable sudden cardiac death at 43.   ROS: All systems reviewed and negative except as per HPI.    Current Outpatient Prescriptions  Medication Sig Dispense Refill  . atorvastatin (LIPITOR) 40 MG tablet TAKE ONE TABLET BY MOUTH EVERY DAY  30 tablet  4  . carvedilol (COREG) 25 MG tablet TAKE ONE TABLET BY MOUTH TWICE DAILY  60 tablet  4  . furosemide (LASIX) 20 MG tablet Take 2 tablets daily for 1 week. Then decrease to 1 tablet daily  48 tablet  3  . HYDROcodone-acetaminophen (NORCO) 5-325 MG per tablet Take 1 tablet by mouth every 6 (six) hours as needed.      Marland Kitchen losartan (COZAAR) 50 MG tablet Take 50 mg by mouth daily.       Marland Kitchen  warfarin (COUMADIN) 5 MG tablet TAKE AS DIRECTED BY COUMADIN CLINIC  100 tablet  1  . potassium chloride (K-DUR) 10 MEQ tablet Take 1 tablet (10 mEq total) by mouth daily.  90 tablet  3   No current facility-administered medications for this visit.    BP 142/79  Pulse 70  Wt 55.792 kg (123 lb)  BMI 21.79 kg/m2 General: NAD, elderly Neck: JVP 10-12 cm, no thyromegaly or thyroid nodule.  Lungs: Clear to auscultation bilaterally with normal respiratory effort. CV: Nondisplaced PMI.  Heart regular S1/S2, no S3/S4, 3/6 HSM at apex/LLSB.  1+ ankle edema bilaterally.  No carotid bruit.   Abdomen: Soft, nontender, no hepatosplenomegaly, no  distention.  Neurologic: Alert and oriented x 3.  Psych: Normal affect. Extremities: No clubbing or cyanosis.  Assessment/Plan:  ATRIAL FIBRILLATION S/p AV nodal ablation with BIV-pacing. No recent falls. She is on coumadin with INR goal 2-2.5. She has been using her cane. She is not on aspirin.  PAD Below-the-knee PAD bilaterally with mostly resolved ulceration on left foot. Followed by wound care and Dr. Arbie Cookey.  CAD Stable s/p CABG and redo CABG with no ischemic symptoms. Continue Coreg, ARB, and statin. She is not on aspirin given warfarin use with stable CAD.  CAROTID ARTERY DISEASE  Stable mild to moderate disease on dopplers in 7/14. Repeat carotid dopplers in 7/15.  Chronic diastolic heart failure  NYHA class II-III symptoms with volume overload on exam.  She has not been taking her Lasix.  I asked her to take Lasix 40 mg daily x 1 week then can decrease to 20 mg daily long-term.  She will also take KCl 10 mEq daily.  BMET/BNP in 10 days.  HYPERLIPIDEMIA Continue statin.    HYPERTENSION BP mildly elevated but will tolerate SBP 150 or less (want to avoid making her lightheaded due to fall risk).  Mitral regurgitation  Moderate to severe on last echo. At 79 years old with 2 prior sternotomies, she will not be a candidate for repeat heart surgery. Will manage CHF medically. Continue losartan for afterload reduction.  Followup in 1 month.    Marca Ancona 01/09/2013

## 2013-01-14 ENCOUNTER — Other Ambulatory Visit (INDEPENDENT_AMBULATORY_CARE_PROVIDER_SITE_OTHER): Payer: Medicare Other

## 2013-01-14 DIAGNOSIS — R0602 Shortness of breath: Secondary | ICD-10-CM

## 2013-01-14 DIAGNOSIS — I5032 Chronic diastolic (congestive) heart failure: Secondary | ICD-10-CM | POA: Diagnosis not present

## 2013-01-14 LAB — BASIC METABOLIC PANEL
BUN: 39 mg/dL — ABNORMAL HIGH (ref 6–23)
CO2: 32 mEq/L (ref 19–32)
Chloride: 101 mEq/L (ref 96–112)
Creatinine, Ser: 1.3 mg/dL — ABNORMAL HIGH (ref 0.4–1.2)
Glucose, Bld: 114 mg/dL — ABNORMAL HIGH (ref 70–99)

## 2013-01-17 ENCOUNTER — Other Ambulatory Visit: Payer: Self-pay | Admitting: Cardiology

## 2013-01-21 ENCOUNTER — Ambulatory Visit (INDEPENDENT_AMBULATORY_CARE_PROVIDER_SITE_OTHER): Payer: Medicare Other | Admitting: *Deleted

## 2013-01-21 DIAGNOSIS — Z7901 Long term (current) use of anticoagulants: Secondary | ICD-10-CM

## 2013-01-21 DIAGNOSIS — I4891 Unspecified atrial fibrillation: Secondary | ICD-10-CM | POA: Diagnosis not present

## 2013-01-21 LAB — POCT INR: INR: 2.9

## 2013-01-28 ENCOUNTER — Encounter: Payer: Self-pay | Admitting: Cardiology

## 2013-01-28 DIAGNOSIS — E7800 Pure hypercholesterolemia, unspecified: Secondary | ICD-10-CM | POA: Diagnosis not present

## 2013-01-28 DIAGNOSIS — E119 Type 2 diabetes mellitus without complications: Secondary | ICD-10-CM | POA: Diagnosis not present

## 2013-01-28 DIAGNOSIS — I10 Essential (primary) hypertension: Secondary | ICD-10-CM | POA: Diagnosis not present

## 2013-02-02 ENCOUNTER — Ambulatory Visit: Payer: Self-pay | Admitting: Podiatry

## 2013-02-03 ENCOUNTER — Other Ambulatory Visit: Payer: Self-pay | Admitting: Cardiology

## 2013-02-03 DIAGNOSIS — E119 Type 2 diabetes mellitus without complications: Secondary | ICD-10-CM | POA: Diagnosis not present

## 2013-02-03 DIAGNOSIS — E7800 Pure hypercholesterolemia, unspecified: Secondary | ICD-10-CM | POA: Diagnosis not present

## 2013-02-03 DIAGNOSIS — D649 Anemia, unspecified: Secondary | ICD-10-CM | POA: Diagnosis not present

## 2013-02-03 DIAGNOSIS — I10 Essential (primary) hypertension: Secondary | ICD-10-CM | POA: Diagnosis not present

## 2013-02-03 DIAGNOSIS — Z23 Encounter for immunization: Secondary | ICD-10-CM | POA: Diagnosis not present

## 2013-02-09 ENCOUNTER — Ambulatory Visit (INDEPENDENT_AMBULATORY_CARE_PROVIDER_SITE_OTHER): Payer: Medicare Other | Admitting: Cardiology

## 2013-02-09 ENCOUNTER — Encounter: Payer: Self-pay | Admitting: Cardiology

## 2013-02-09 ENCOUNTER — Ambulatory Visit (INDEPENDENT_AMBULATORY_CARE_PROVIDER_SITE_OTHER): Payer: Medicare Other | Admitting: *Deleted

## 2013-02-09 VITALS — BP 132/64 | HR 70 | Ht 60.0 in | Wt 120.0 lb

## 2013-02-09 DIAGNOSIS — I340 Nonrheumatic mitral (valve) insufficiency: Secondary | ICD-10-CM

## 2013-02-09 DIAGNOSIS — I509 Heart failure, unspecified: Secondary | ICD-10-CM | POA: Diagnosis not present

## 2013-02-09 DIAGNOSIS — E785 Hyperlipidemia, unspecified: Secondary | ICD-10-CM | POA: Diagnosis not present

## 2013-02-09 DIAGNOSIS — Z7901 Long term (current) use of anticoagulants: Secondary | ICD-10-CM | POA: Diagnosis not present

## 2013-02-09 DIAGNOSIS — I4891 Unspecified atrial fibrillation: Secondary | ICD-10-CM

## 2013-02-09 DIAGNOSIS — I5032 Chronic diastolic (congestive) heart failure: Secondary | ICD-10-CM

## 2013-02-09 DIAGNOSIS — I6529 Occlusion and stenosis of unspecified carotid artery: Secondary | ICD-10-CM

## 2013-02-09 DIAGNOSIS — I25810 Atherosclerosis of coronary artery bypass graft(s) without angina pectoris: Secondary | ICD-10-CM

## 2013-02-09 DIAGNOSIS — I059 Rheumatic mitral valve disease, unspecified: Secondary | ICD-10-CM

## 2013-02-09 LAB — POCT INR: INR: 3.9

## 2013-02-09 NOTE — Progress Notes (Signed)
Patient ID: Judith Zuniga, female   DOB: [birth year], 79 y.o.   MRN: [id number] PCP: Dr. Selena Batten  79 yo with a history of atrial fibrillation s/p AV nodal ablation, CAD s/p CABG, and ischemic cardiomyopathy presents for cardiology followup.  She had initial CABG in 1985 with redo in 1998. She developed an ischemic cardiomyopathy with EF 25%.  However, with biventricular pacing, her EF had improved to 50-55% on last echo in 8/13. That echo did show moderate to severe mitral regurgitation and severe TR.  She has had vascular evaluation by Dr. Arbie Cookey and has been found to have significant below the knee PAD bilaterally.    She lives alone and functions independently.  She has had a left foot ulcer related to PAD that has healed.  At last appointment, she reported exertional dyspnea and was noted to be volume overloaded.  I had her start Lasix 40 mg daily, which was later decreased to 20 mg daily.   She has lost 3 lbs.  She now denies dyspnea with housework or with walking on flat ground.  Occasional lightheadedness if she stands too fast, no falls or syncope.  No chest pain.  She actually denies significant claudication.    Labs (12/11): K 4.7, creatinine 1.4, HCT 31.6 Labs (8/12): LDL 75, HDL 52 Labs (9/12): K 4, creatinine 1.2, pro-BNP 200, LDL 75, HDL 52 Labs (4/13): K 4, creatinine 1.3, LDL 63 Labs (8/13): LDL 75, HDL 57, BNP 275 Labs (9/13): K 3.8, creatinine 1.3 Labs (11/13): K 3.9, creatinine 0.9, BNP 365 Labs (6/14): K 4.3, creatinine 1.0, LDL 90, HDL 57 Labs (10/14): LDL 98, LDL particle number 1087, K 4.6, creatinine 1.2  ECG: Atrial fibrillation with BiV pacing  PMH: 1. Atrial fibrillation: s/p AV nodal ablation 2. Ischemic CMP: EF as low as 25%.  After CRT, EF improved.  Last echo (8/13): EF 50-55%, moderate to severe central MR, severe TR, PA systolic pressure 49 mmHg.  3.  St. Jude CRT device 4. CAD: s/p CABG 1985 with LIMA-LAD, SVG-PDA, SVG-PLV. Redo CABG 1998 with RIMA-D1 and D2, SVG-PDA,  SVG-PLV, SVG-CFX.   5. Hyperlipidemia 6. Peripheral neuropathy.  7. Mitral regurgitation: Moderate to severe by 8/13 echo.  Possibly due to annular dilatation.  8. Carotid stenosis: Dopplers (7/12) with 40-59% bilateral stenosis.  Dopplers (7/13) with 40-59% bilateral stenosis.  Dopplers (7/14) with 40-59% bilateral stenosis.  9. PAD: Chronic ulcer dorsum left foot (now mostly healed).  Evaluation by Dr. Arbie Cookey in 4/13 with good circulation to the popliteal arteries but right TBI 0.35 and left TBI 0.32 (significant below the knee PAD).  10. 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   SH: Lives alone in [place]. Retired.  Nonsmoker, no ETOH.    FH: Mother died at 38, h/o MI.  Father with probable sudden cardiac death at 7.   ROS: All systems reviewed and negative except as per HPI.    Current Outpatient Prescriptions  Medication Sig Dispense Refill  . atorvastatin (LIPITOR) 40 MG tablet TAKE ONE TABLET BY MOUTH ONCE DAILY  30 tablet  0  . carvedilol (COREG) 25 MG tablet TAKE ONE TABLET BY MOUTH TWICE DAILY  60 tablet  0  . furosemide (LASIX) 40 MG tablet TAKE ONE TABLET BY MOUTH EVERY DAY  30 tablet  6  . HYDROcodone-acetaminophen (NORCO) 5-325 MG per tablet Take 1 tablet by mouth every 6 (six) hours as needed.      Marland Kitchen losartan (COZAAR) 50 MG tablet Take 50 mg  by mouth daily.       . potassium chloride (K-DUR) 10 MEQ tablet Take 1 tablet (10 mEq total) by mouth daily.  90 tablet  3  . warfarin (COUMADIN) 5 MG tablet TAKE AS DIRECTED BY COUMADIN CLINIC  100 tablet  1   No current facility-administered medications for this visit.    BP 132/64  Pulse 70  Ht 5' (1.524 m)  Wt 54.432 kg (120 lb)  BMI 23.44 kg/m2 General: NAD, elderly Neck: JVP 7-8 cm, no thyromegaly or thyroid nodule.  Lungs: Clear to auscultation bilaterally with normal respiratory effort. CV: Nondisplaced PMI.  Heart regular S1/S2, no S3/S4, 3/6 HSM at apex/LLSB.  No edema.  No carotid  bruit.   Abdomen: Soft, nontender, no hepatosplenomegaly, no distention.  Neurologic: Alert and oriented x 3.  Psych: Normal affect. Extremities: No clubbing or cyanosis.  Assessment/Plan:  ATRIAL FIBRILLATION S/p AV nodal ablation with BIV-pacing. No recent falls. She is on coumadin with INR goal 2-2.5. She has been using her cane. She is not on aspirin.  PAD Below-the-knee PAD bilaterally with recently resolved ulceration on left foot. No significant claudication. CAD Stable s/p CABG and redo CABG with no ischemic symptoms. Continue Coreg, ARB, and statin. She is not on aspirin given warfarin use with stable CAD.  CAROTID ARTERY DISEASE  Stable mild to moderate disease on dopplers in 7/14. Repeat carotid dopplers in 7/15.  Chronic diastolic heart failure  NYHA class II symptoms (improved).  She now appears near-euvolemic.  Continue Lasix 20 mg daily.  BMET again in 2 months. HYPERLIPIDEMIA Continue statin.  LDL not ideal but at least < 100.  Mitral regurgitation  Moderate to severe on last echo. At 79 years old with 2 prior sternotomies, she will not be a candidate for repeat heart surgery. Will manage CHF medically. Continue losartan for afterload reduction.  Marca Ancona 02/09/2013

## 2013-02-09 NOTE — Patient Instructions (Addendum)
Your physician recommends that you return for lab work in: 2 months--BMET.  Your physician wants you to follow-up in: 4 months with Dr Shirlee Latch. (February 2015). You will receive a reminder letter in the mail two months in advance. If you don't receive a letter, please call our office to schedule the follow-up appointment.

## 2013-02-11 ENCOUNTER — Other Ambulatory Visit: Payer: Self-pay | Admitting: *Deleted

## 2013-02-11 MED ORDER — ATORVASTATIN CALCIUM 40 MG PO TABS: ORAL_TABLET | ORAL | Status: DC

## 2013-02-25 ENCOUNTER — Ambulatory Visit (INDEPENDENT_AMBULATORY_CARE_PROVIDER_SITE_OTHER): Payer: Medicare Other | Admitting: *Deleted

## 2013-02-25 DIAGNOSIS — Z7901 Long term (current) use of anticoagulants: Secondary | ICD-10-CM

## 2013-02-25 DIAGNOSIS — I4891 Unspecified atrial fibrillation: Secondary | ICD-10-CM | POA: Diagnosis not present

## 2013-03-09 ENCOUNTER — Other Ambulatory Visit: Payer: Self-pay | Admitting: Cardiology

## 2013-03-15 ENCOUNTER — Ambulatory Visit (INDEPENDENT_AMBULATORY_CARE_PROVIDER_SITE_OTHER): Payer: Medicare Other | Admitting: General Practice

## 2013-03-15 DIAGNOSIS — I4891 Unspecified atrial fibrillation: Secondary | ICD-10-CM

## 2013-03-15 DIAGNOSIS — Z7901 Long term (current) use of anticoagulants: Secondary | ICD-10-CM | POA: Diagnosis not present

## 2013-03-15 LAB — POCT INR: INR: 2.7

## 2013-03-18 ENCOUNTER — Encounter: Payer: Self-pay | Admitting: Podiatry

## 2013-03-18 ENCOUNTER — Ambulatory Visit (INDEPENDENT_AMBULATORY_CARE_PROVIDER_SITE_OTHER): Payer: Medicare Other | Admitting: Podiatry

## 2013-03-18 VITALS — BP 109/84 | HR 70 | Resp 16 | Ht 65.0 in | Wt 123.0 lb

## 2013-03-18 DIAGNOSIS — B351 Tinea unguium: Secondary | ICD-10-CM | POA: Diagnosis not present

## 2013-03-18 DIAGNOSIS — M79609 Pain in unspecified limb: Secondary | ICD-10-CM

## 2013-03-20 NOTE — Progress Notes (Signed)
Judith Zuniga presents today with a chief complaint of painful toenails one through 5 bilateral.  Objective: Pulses remain palpable bilateral. Nails are thick yellow dystrophic clinically mycotic and painful on palpation.  Assessment: Pain in limb secondary to onychomycosis 1 through 5 bilateral.  Plan: Debridement of nails 1 through 5 bilateral is cover service secondary to pain.

## 2013-03-29 ENCOUNTER — Other Ambulatory Visit: Payer: Medicare Other

## 2013-04-05 ENCOUNTER — Ambulatory Visit (INDEPENDENT_AMBULATORY_CARE_PROVIDER_SITE_OTHER): Payer: Medicare Other | Admitting: *Deleted

## 2013-04-05 ENCOUNTER — Other Ambulatory Visit (INDEPENDENT_AMBULATORY_CARE_PROVIDER_SITE_OTHER): Payer: Medicare Other

## 2013-04-05 DIAGNOSIS — I4891 Unspecified atrial fibrillation: Secondary | ICD-10-CM

## 2013-04-05 DIAGNOSIS — Z7901 Long term (current) use of anticoagulants: Secondary | ICD-10-CM

## 2013-04-05 DIAGNOSIS — I509 Heart failure, unspecified: Secondary | ICD-10-CM

## 2013-04-05 DIAGNOSIS — I5032 Chronic diastolic (congestive) heart failure: Secondary | ICD-10-CM

## 2013-04-05 LAB — BASIC METABOLIC PANEL
CO2: 27 mEq/L (ref 19–32)
Calcium: 9.2 mg/dL (ref 8.4–10.5)
Chloride: 107 mEq/L (ref 96–112)
Glucose, Bld: 93 mg/dL (ref 70–99)
Potassium: 4.6 mEq/L (ref 3.5–5.1)
Sodium: 142 mEq/L (ref 135–145)

## 2013-04-05 LAB — POCT INR: INR: 2.8

## 2013-04-21 ENCOUNTER — Ambulatory Visit (INDEPENDENT_AMBULATORY_CARE_PROVIDER_SITE_OTHER): Payer: Medicare Other | Admitting: *Deleted

## 2013-04-21 DIAGNOSIS — Z7901 Long term (current) use of anticoagulants: Secondary | ICD-10-CM

## 2013-04-21 DIAGNOSIS — Z5181 Encounter for therapeutic drug level monitoring: Secondary | ICD-10-CM | POA: Diagnosis not present

## 2013-04-21 DIAGNOSIS — I4891 Unspecified atrial fibrillation: Secondary | ICD-10-CM | POA: Diagnosis not present

## 2013-04-21 LAB — POCT INR: INR: 2.6

## 2013-05-03 ENCOUNTER — Other Ambulatory Visit: Payer: Self-pay | Admitting: Pharmacist

## 2013-05-03 MED ORDER — WARFARIN SODIUM 5 MG PO TABS: ORAL_TABLET | ORAL | Status: DC

## 2013-05-05 ENCOUNTER — Ambulatory Visit (INDEPENDENT_AMBULATORY_CARE_PROVIDER_SITE_OTHER): Payer: Medicare Other | Admitting: *Deleted

## 2013-05-05 DIAGNOSIS — I4891 Unspecified atrial fibrillation: Secondary | ICD-10-CM | POA: Diagnosis not present

## 2013-05-05 DIAGNOSIS — Z5181 Encounter for therapeutic drug level monitoring: Secondary | ICD-10-CM | POA: Diagnosis not present

## 2013-05-05 DIAGNOSIS — Z7901 Long term (current) use of anticoagulants: Secondary | ICD-10-CM | POA: Diagnosis not present

## 2013-05-05 LAB — POCT INR: INR: 2.2

## 2013-05-18 DIAGNOSIS — E119 Type 2 diabetes mellitus without complications: Secondary | ICD-10-CM | POA: Diagnosis not present

## 2013-05-18 DIAGNOSIS — I10 Essential (primary) hypertension: Secondary | ICD-10-CM | POA: Diagnosis not present

## 2013-05-21 ENCOUNTER — Encounter: Payer: Self-pay | Admitting: Cardiology

## 2013-05-21 DIAGNOSIS — E119 Type 2 diabetes mellitus without complications: Secondary | ICD-10-CM | POA: Diagnosis not present

## 2013-05-21 DIAGNOSIS — E7800 Pure hypercholesterolemia, unspecified: Secondary | ICD-10-CM | POA: Diagnosis not present

## 2013-05-21 DIAGNOSIS — I10 Essential (primary) hypertension: Secondary | ICD-10-CM | POA: Diagnosis not present

## 2013-05-27 ENCOUNTER — Encounter: Payer: Medicare Other | Admitting: Internal Medicine

## 2013-06-03 DIAGNOSIS — I10 Essential (primary) hypertension: Secondary | ICD-10-CM | POA: Diagnosis not present

## 2013-06-03 DIAGNOSIS — E7800 Pure hypercholesterolemia, unspecified: Secondary | ICD-10-CM | POA: Diagnosis not present

## 2013-06-03 DIAGNOSIS — E119 Type 2 diabetes mellitus without complications: Secondary | ICD-10-CM | POA: Diagnosis not present

## 2013-06-03 DIAGNOSIS — F0390 Unspecified dementia without behavioral disturbance: Secondary | ICD-10-CM | POA: Diagnosis not present

## 2013-06-10 ENCOUNTER — Encounter: Payer: Medicare Other | Admitting: Internal Medicine

## 2013-06-11 ENCOUNTER — Ambulatory Visit (INDEPENDENT_AMBULATORY_CARE_PROVIDER_SITE_OTHER): Payer: Medicare Other | Admitting: Pharmacist

## 2013-06-11 DIAGNOSIS — I4891 Unspecified atrial fibrillation: Secondary | ICD-10-CM

## 2013-06-11 DIAGNOSIS — Z7901 Long term (current) use of anticoagulants: Secondary | ICD-10-CM

## 2013-06-11 LAB — POCT INR: INR: 2

## 2013-06-12 ENCOUNTER — Other Ambulatory Visit: Payer: Self-pay | Admitting: Cardiology

## 2013-06-15 ENCOUNTER — Ambulatory Visit: Payer: BLUE CROSS/BLUE SHIELD | Admitting: Podiatry

## 2013-06-25 ENCOUNTER — Encounter: Payer: Self-pay | Admitting: Podiatry

## 2013-06-25 ENCOUNTER — Ambulatory Visit (INDEPENDENT_AMBULATORY_CARE_PROVIDER_SITE_OTHER): Payer: Medicare Other | Admitting: Podiatry

## 2013-06-25 VITALS — BP 102/64 | HR 86 | Resp 12

## 2013-06-25 DIAGNOSIS — M79609 Pain in unspecified limb: Secondary | ICD-10-CM | POA: Diagnosis not present

## 2013-06-25 DIAGNOSIS — B351 Tinea unguium: Secondary | ICD-10-CM

## 2013-06-25 NOTE — Progress Notes (Signed)
She presents today with a chief complaint of painful E. elongated toenails one through 5 bilateral.  Objective: Vital signs are stable she is alert and oriented x3. Nails are thick yellow dystrophic with mycotic and painful palpation.  Assessment: Pain in limb secondary to onychomycosis 1 through 5 bilateral.  Plan: Debridement of nails in thickness and length as cover service secondary to pain.

## 2013-07-05 DIAGNOSIS — E7800 Pure hypercholesterolemia, unspecified: Secondary | ICD-10-CM | POA: Diagnosis not present

## 2013-07-05 DIAGNOSIS — I10 Essential (primary) hypertension: Secondary | ICD-10-CM | POA: Diagnosis not present

## 2013-07-05 DIAGNOSIS — R413 Other amnesia: Secondary | ICD-10-CM | POA: Diagnosis not present

## 2013-07-05 DIAGNOSIS — E119 Type 2 diabetes mellitus without complications: Secondary | ICD-10-CM | POA: Diagnosis not present

## 2013-07-06 ENCOUNTER — Ambulatory Visit: Payer: BLUE CROSS/BLUE SHIELD | Admitting: Podiatry

## 2013-07-13 ENCOUNTER — Encounter: Payer: Self-pay | Admitting: Internal Medicine

## 2013-07-13 ENCOUNTER — Ambulatory Visit (INDEPENDENT_AMBULATORY_CARE_PROVIDER_SITE_OTHER): Payer: Medicare Other | Admitting: *Deleted

## 2013-07-13 ENCOUNTER — Ambulatory Visit (INDEPENDENT_AMBULATORY_CARE_PROVIDER_SITE_OTHER): Payer: Medicare Other | Admitting: Internal Medicine

## 2013-07-13 VITALS — BP 147/74 | HR 70 | Ht 64.0 in | Wt 121.1 lb

## 2013-07-13 DIAGNOSIS — I509 Heart failure, unspecified: Secondary | ICD-10-CM

## 2013-07-13 DIAGNOSIS — Z5181 Encounter for therapeutic drug level monitoring: Secondary | ICD-10-CM | POA: Insufficient documentation

## 2013-07-13 DIAGNOSIS — I5032 Chronic diastolic (congestive) heart failure: Secondary | ICD-10-CM

## 2013-07-13 DIAGNOSIS — Z7901 Long term (current) use of anticoagulants: Secondary | ICD-10-CM

## 2013-07-13 DIAGNOSIS — I4891 Unspecified atrial fibrillation: Secondary | ICD-10-CM | POA: Diagnosis not present

## 2013-07-13 DIAGNOSIS — Z950 Presence of cardiac pacemaker: Secondary | ICD-10-CM | POA: Diagnosis not present

## 2013-07-13 DIAGNOSIS — E119 Type 2 diabetes mellitus without complications: Secondary | ICD-10-CM | POA: Diagnosis not present

## 2013-07-13 DIAGNOSIS — H04129 Dry eye syndrome of unspecified lacrimal gland: Secondary | ICD-10-CM | POA: Diagnosis not present

## 2013-07-13 LAB — MDC_IDC_ENUM_SESS_TYPE_INCLINIC
Brady Statistic RV Percent Paced: 99 %
Implantable Pulse Generator Serial Number: 1989073
Lead Channel Impedance Value: 469 Ohm
Lead Channel Impedance Value: 709 Ohm
Lead Channel Pacing Threshold Amplitude: 1 V
Lead Channel Pacing Threshold Amplitude: 1 V
Lead Channel Pacing Threshold Pulse Width: 0.4 ms
Lead Channel Pacing Threshold Pulse Width: 0.6 ms
Lead Channel Setting Pacing Amplitude: 2 V
MDC IDC MSMT BATTERY VOLTAGE: 2.76 V
MDC IDC SET LEADCHNL LV PACING PULSEWIDTH: 0.6 ms
MDC IDC SET LEADCHNL RV PACING AMPLITUDE: 2.5 V
MDC IDC SET LEADCHNL RV PACING PULSEWIDTH: 0.4 ms
MDC IDC SET LEADCHNL RV SENSING SENSITIVITY: 4 mV

## 2013-07-13 LAB — POCT INR: INR: 2.5

## 2013-07-13 NOTE — Progress Notes (Signed)
Patient Care Team: Jani Gravel, MD as PCP - General Newt Minion, MD as Attending Physician (Orthopedic Surgery) Larey Dresser, MD (Cardiology)   HPI  Judith Zuniga is a 80 y.o. female following CRT implantation with AV junction ablation  for uncontrolled atrial arrhythmias. This was done in January.2009.    Echo cardiogram 2013 demonstrated improvement in left ventricular function; she had severe bilateral AV valvular regurgitation.   The patient denies chest pain, shortness of breath, nocturnal dyspnea, orthopnea or peripheral edema. There have been no palpitations, or syncope.SHe does however have orthostatic lightheadedness which has been particularly problematic in the morning.   Past Medical History  Diagnosis Date  . Atrioventricular block, complete s/p AV ablation   . Pacemaker -CRT- [employer] Judes   . Type II or unspecified type diabetes mellitus without mention of complication, not stated as uncontrolled   . CAD (coronary artery disease)     s/p redo bypass surgery in 1998 with patent graft in Jan. 2009.   . Ischemic cardiomyopathy     ischemic heart myopathy , ejection fraction of 25%  . Systolic heart failure     class II and euvolemic  . Permanent atrial fibrillation   . Hyperlipidemia   . Diabetes mellitus   . Premature ventricular contractions     frequent  . Elevated liver function tests     on amiodarone  . Hypertension     Past Surgical History  Procedure Laterality Date  . Coronary artery bypass graft  1998    CABG x5: RIMA to diagonal one, RIMA to diagonal 2, SVG to PD, SVG to PL, SVG to circ  . Coronary artery bypass graft  1985    x3: LIMA to LAD, SVG to PD and SVG to PL  . Cardiac caths      multile  . Pacer generator change out      St. Jude pacemaker    Current Outpatient Prescriptions  Medication Sig Dispense Refill  . amLODipine (NORVASC) 2.5 MG tablet Take 2.5 mg by mouth daily.       Marland Kitchen atorvastatin (LIPITOR) 40 MG tablet TAKE ONE  TABLET BY MOUTH ONCE DAILY  30 tablet  3  . carvedilol (COREG) 25 MG tablet TAKE ONE TABLET BY MOUTH TWICE DAILY  60 tablet  0  . furosemide (LASIX) 40 MG tablet TAKE1/2 TABLET BY MOUTH EVERY DAY      . glimepiride (AMARYL) 1 MG tablet Take 1 mg by mouth daily with breakfast.       . losartan (COZAAR) 50 MG tablet Take 50 mg by mouth daily.       . Memantine HCl ER (NAMENDA XR) 28 MG CP24 Take 1 capsule by mouth daily.      . potassium chloride (K-DUR) 10 MEQ tablet Take 1 tablet (10 mEq total) by mouth daily.  90 tablet  3  . warfarin (COUMADIN) 5 MG tablet Take as directed by coumadin clinic  100 tablet  1   No current facility-administered medications for this visit.    No Known Allergies  Review of Systems negative except from HPI and PMH  Physical Exam BP 147/74  Pulse 70  Ht 5\' 4"  (1.626 m)  Wt 121 lb 1.9 oz (54.94 kg)  BMI 20.78 kg/m2 Well developed and well nourished in no acute distress HENT normal E scleral and icterus clear Neck Supple Device pocket well healed; without hematoma or erythema.  There is no tethering  Clear  to ausculation  Regular rate and rhythm, 2/6 murmur preserved S2  Soft with active bowel sounds No clubbing cyanosis  Edema Alert and oriented, grossly normal motor and sensory function Skin Warm and Dry    Assessment and  Plan  Atrial fibrillation-permanent  Complete heart block S./P. AV node ablation  CRT-P.-St. Jude  Hypertension  Orthostatic lightheadedness  Stable from rhythm point view  We discussed the physiology of orthostatic intolerance. We reviewed maneuvers to try to mitigate including raising the head of the bed 4-6 inches, isometric contraction, volume repletion especially in the morning. The event that these are insufficient, on pharmacological maneuvers could include abdominal binding or thigh stockings  Pharmacological maneuvers including the use of Mestinon and her ProAmatine.

## 2013-07-13 NOTE — Patient Instructions (Signed)

## 2013-07-15 ENCOUNTER — Encounter: Payer: Self-pay | Admitting: Internal Medicine

## 2013-07-16 ENCOUNTER — Other Ambulatory Visit: Payer: Self-pay | Admitting: Cardiology

## 2013-07-28 ENCOUNTER — Ambulatory Visit (INDEPENDENT_AMBULATORY_CARE_PROVIDER_SITE_OTHER): Payer: Medicare Other | Admitting: Cardiology

## 2013-07-28 ENCOUNTER — Encounter: Payer: Self-pay | Admitting: Cardiology

## 2013-07-28 VITALS — BP 140/56 | HR 70 | Ht 64.0 in | Wt 122.0 lb

## 2013-07-28 DIAGNOSIS — I509 Heart failure, unspecified: Secondary | ICD-10-CM

## 2013-07-28 DIAGNOSIS — I5032 Chronic diastolic (congestive) heart failure: Secondary | ICD-10-CM

## 2013-07-28 DIAGNOSIS — I951 Orthostatic hypotension: Secondary | ICD-10-CM

## 2013-07-28 DIAGNOSIS — I25810 Atherosclerosis of coronary artery bypass graft(s) without angina pectoris: Secondary | ICD-10-CM | POA: Diagnosis not present

## 2013-07-28 DIAGNOSIS — I340 Nonrheumatic mitral (valve) insufficiency: Secondary | ICD-10-CM

## 2013-07-28 DIAGNOSIS — I059 Rheumatic mitral valve disease, unspecified: Secondary | ICD-10-CM | POA: Diagnosis not present

## 2013-07-28 DIAGNOSIS — I4891 Unspecified atrial fibrillation: Secondary | ICD-10-CM

## 2013-07-28 NOTE — Patient Instructions (Signed)
Stop amlodipine.   Use your cane.  Use compression stockings to help the swelling in your feet and legs. Put them on in the morning and take them off at bedtime.   Your physician wants you to follow-up in: 3 months with Dr Aundra Dubin. (July 2015). You will receive a reminder letter in the mail two months in advance. If you don't receive a letter, please call our office to schedule the follow-up appointment.

## 2013-07-28 NOTE — Progress Notes (Signed)
Patient ID: Judith Zuniga, female   DOB: 1920/11/05, 80 y.o.   MRN: [id number] PCP: Dr. Maudie Mercury  80 yo with a history of atrial fibrillation s/p AV nodal ablation, CAD s/p CABG, and ischemic cardiomyopathy presents for cardiology followup.  She had initial CABG in 1985 with redo in 1998. She developed an ischemic cardiomyopathy with EF 25%.  However, with biventricular pacing, her EF had improved to 50-55% on last echo in 8/13. That echo did show moderate to severe mitral regurgitation and severe TR.  She has had vascular evaluation by Dr. Donnetta Hutching and has been found to have significant below the knee PAD bilaterally.    She lives alone and functions independently.  She has had a left foot ulcer related to PAD that has healed.  At a prior appointment, she reported exertional dyspnea and was noted to be volume overloaded.  I had her start Lasix 40 mg daily, which was later decreased to 20 mg daily.  She now denies dyspnea with housework or with walking on flat ground.  No chest pain.  She actually denies significant claudication.  Main complaint is of "dizzy spells."  She will get lightheaded on occasion with standing.  No falls.   Labs (12/11): K 4.7, creatinine 1.4, HCT 31.6 Labs (8/12): LDL 75, HDL 52 Labs (9/12): K 4, creatinine 1.2, pro-BNP 200, LDL 75, HDL 52 Labs (4/13): K 4, creatinine 1.3, LDL 63 Labs (8/13): LDL 75, HDL 57, BNP 275 Labs (9/13): K 3.8, creatinine 1.3 Labs (11/13): K 3.9, creatinine 0.9, BNP 365 Labs (6/14): K 4.3, creatinine 1.0, LDL 90, HDL 57 Labs (10/14): LDL 98, LDL particle number 1087, K 4.6, creatinine 1.2 Labs (2/15): LDL 88, HDL 55, K 4.1, creatinine 1.1  PMH: 1. Atrial fibrillation: s/p AV nodal ablation 2. Ischemic CMP: EF as low as 25%.  After CRT, EF improved.  Last echo (8/13): EF 50-55%, moderate to severe central MR, severe TR, PA systolic pressure 49 mmHg.  3.  St. Jude CRT device 4. CAD: s/p CABG 1985 with LIMA-LAD, SVG-PDA, SVG-PLV. Redo CABG 1998 with RIMA-D1  and D2, SVG-PDA, SVG-PLV, SVG-CFX.   5. Hyperlipidemia 6. Peripheral neuropathy.  7. Mitral regurgitation: Moderate to severe by 8/13 echo.  Possibly due to annular dilatation.  8. Carotid stenosis: Dopplers (7/12) with 40-59% bilateral stenosis.  Dopplers (7/13) with 40-59% bilateral stenosis.  Dopplers (7/14) with 40-59% bilateral stenosis.  9. PAD: Chronic ulcer dorsum left foot (now mostly healed).  Evaluation by Dr. Donnetta Hutching in 4/13 with good circulation to the popliteal arteries but right TBI 0.35 and left TBI 0.32 (significant below the knee PAD).  10. 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   SH: Lives alone in [place]. Retired.  Nonsmoker, no ETOH.    FH: Mother died at 23, h/o MI.  Father with probable sudden cardiac death at 61.   ROS: All systems reviewed and negative except as per HPI.    Current Outpatient Prescriptions  Medication Sig Dispense Refill  . atorvastatin (LIPITOR) 40 MG tablet TAKE ONE TABLET BY MOUTH ONCE DAILY  30 tablet  3  . carvedilol (COREG) 25 MG tablet TAKE ONE TABLET BY MOUTH TWICE DAILY  60 tablet  3  . furosemide (LASIX) 40 MG tablet TAKE1/2 TABLET BY MOUTH EVERY DAY      . glimepiride (AMARYL) 1 MG tablet Take 1 mg by mouth daily with breakfast.       . losartan (COZAAR) 50 MG tablet Take 50 mg by mouth  daily.       . Memantine HCl ER (NAMENDA XR) 28 MG CP24 Take 1 capsule by mouth daily.      . potassium chloride (K-DUR) 10 MEQ tablet Take 1 tablet (10 mEq total) by mouth daily.  90 tablet  3  . warfarin (COUMADIN) 5 MG tablet Take as directed by coumadin clinic  100 tablet  1   No current facility-administered medications for this visit.    BP 140/56  Pulse 70  Ht 5\' 4"  (1.626 m)  Wt 55.339 kg (122 lb)  BMI 20.93 kg/m2 General: NAD, elderly Neck: JVP 7 cm, no thyromegaly or thyroid nodule.  Lungs: Clear to auscultation bilaterally with normal respiratory effort. CV: Nondisplaced PMI.  Heart regular S1/S2, no  S3/S4, 3/6 HSM at apex/LLSB.  No edema.  No carotid bruit.   Abdomen: Soft, nontender, no hepatosplenomegaly, no distention.  Neurologic: Alert and oriented x 3.  Psych: Normal affect. Extremities: No clubbing or cyanosis.  Assessment/Plan:  ATRIAL FIBRILLATION S/p AV nodal ablation with BIV-pacing. No recent falls. She is on coumadin with INR goal 2-2.5. She has been using her cane. She is not on aspirin.  PAD Below-the-knee PAD bilaterally with recently resolved ulceration on left foot. No significant claudication. CAD Stable s/p CABG and redo CABG with no ischemic symptoms. Continue Coreg, ARB, and statin. She is not on aspirin given warfarin use with stable CAD.  CAROTID ARTERY DISEASE  Stable mild to moderate disease on dopplers in 7/14. Repeat carotid dopplers in 7/15.  Chronic diastolic heart failure  NYHA class II symptoms (improved).  She now appears near-euvolemic.  Continue Lasix 20 mg daily.  Recent BMET looked. HYPERLIPIDEMIA Continue statin.  Recent LDL ok.   Mitral regurgitation  Moderate to severe on last echo. At 80 years old with 2 prior sternotomies, she will not be a candidate for repeat heart surgery. Will manage CHF medically. Continue losartan for afterload reduction. Orthostatic lightheadedness No falls but gets lightheadedness with standing.  I am going to have her stop amlodipine.  I will also have her wear compression stockings.    Larey Dresser 07/28/2013

## 2013-08-24 IMAGING — CT CT FEMUR *L* W/O CM
4 series · 17 of 35 positions shown, 19 images · non-contrast
Comparison: None

CLINICAL DATA: Left thigh pain and swelling.

CT OF THE LEFT FEMUR WITHOUT CONTRAST

[Series 3: femur standard · axial · 0.51mm/px · z∈[-194,+143]mm · 6 of 216 slices shown]
[im 27/216  soft-tissue]
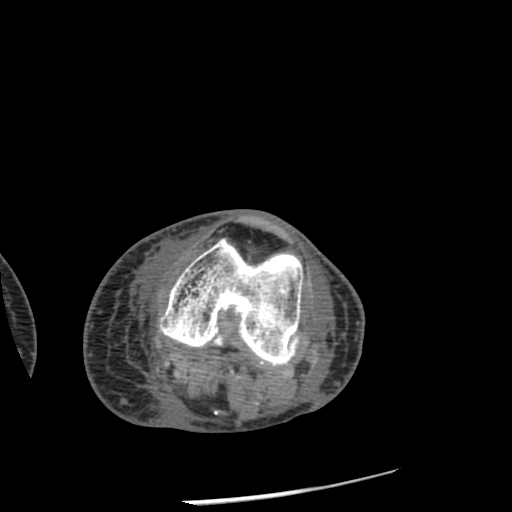
[im 54/216  soft-tissue]
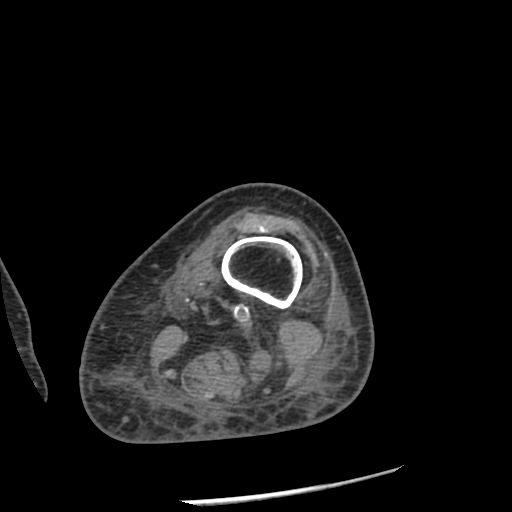
[im 81/216  soft-tissue]
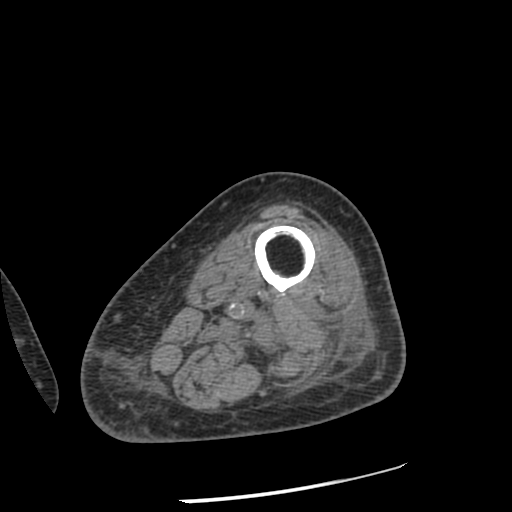
[im 108/216  soft-tissue]
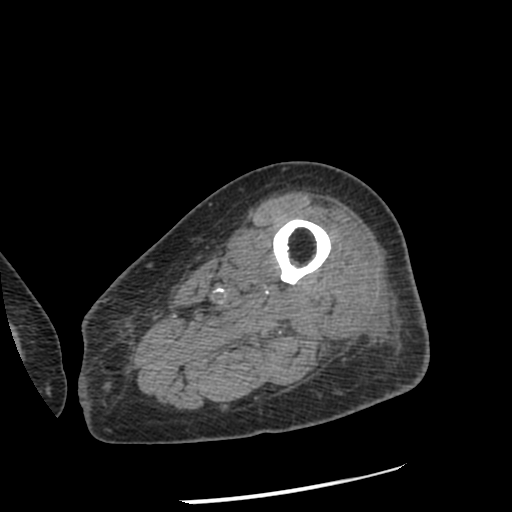
[im 135/216  soft-tissue]
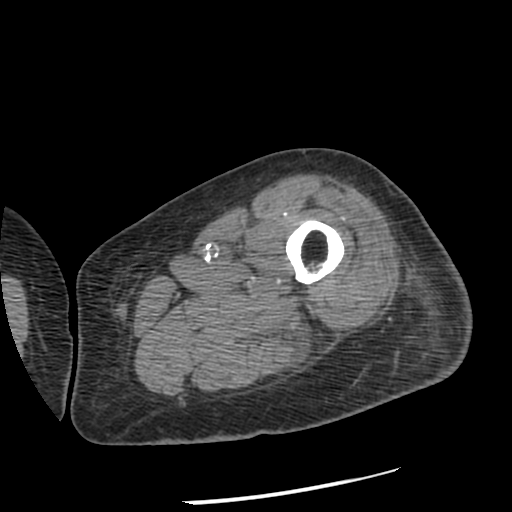
[im 162/216  soft-tissue]
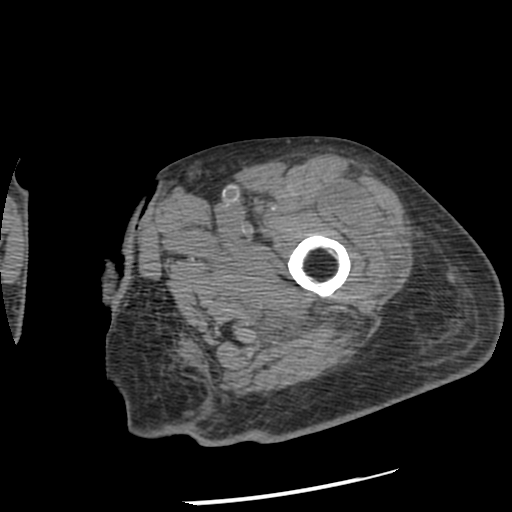

[Series 100: st large dfov · axial · 0.98mm/px · z∈[-124,+136]mm · 3 of 106 slices shown, 4 images]
[im 27/106  soft-tissue]
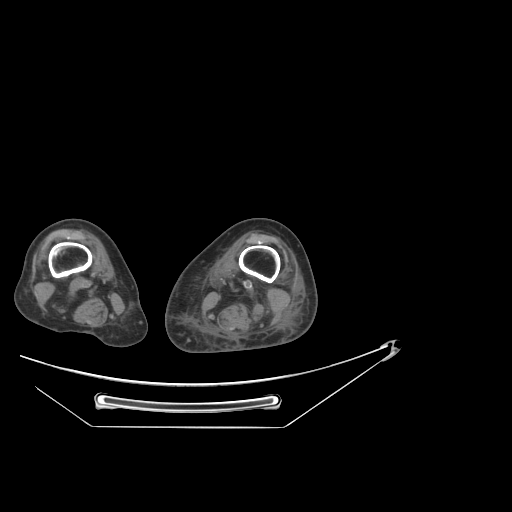
[im 27/106  bone]
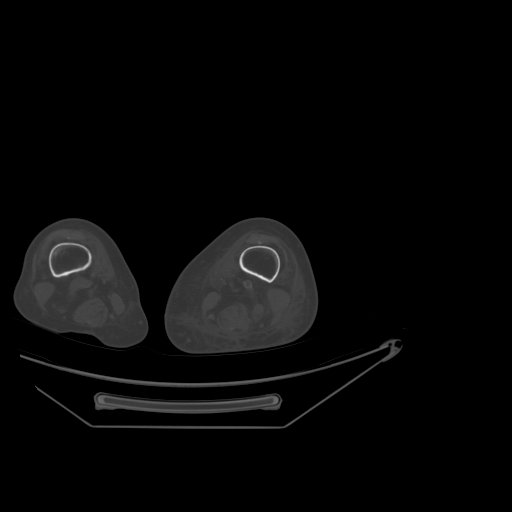
[im 53/106  bone]
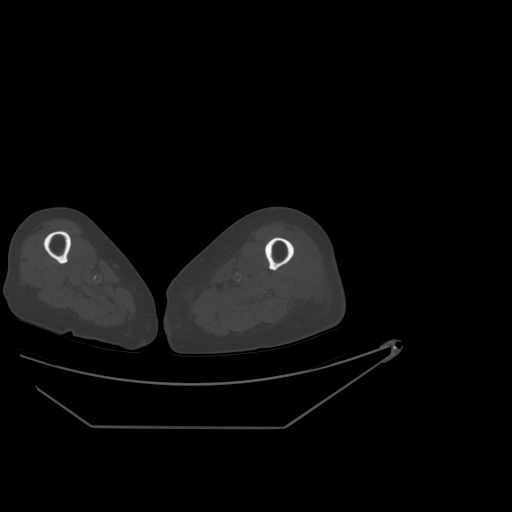
[im 79/106  bone]
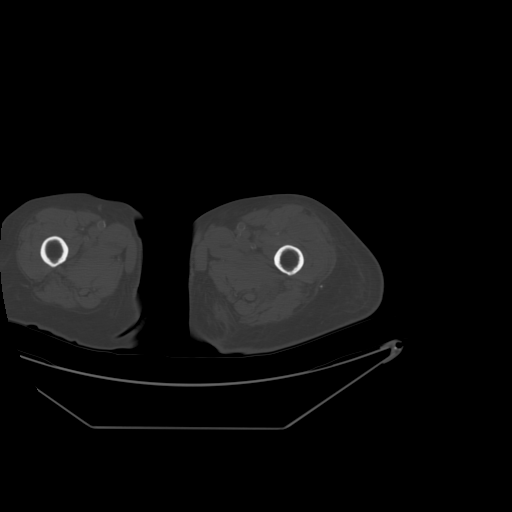

[Series 103: sag · sagittal · 1.08mm/px · 5 of 98 slices shown, 6 images]
[im 33/98  bone]
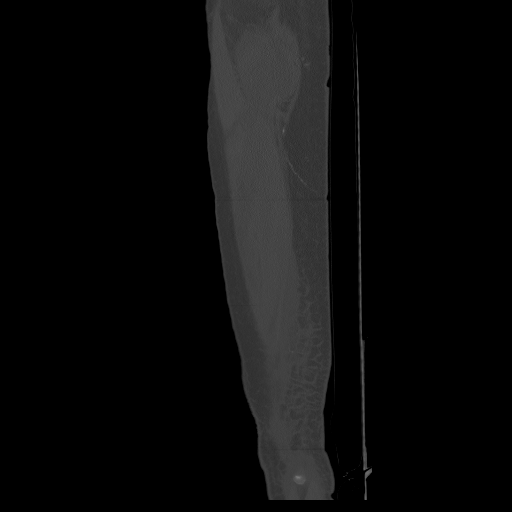
[im 41/98  bone]
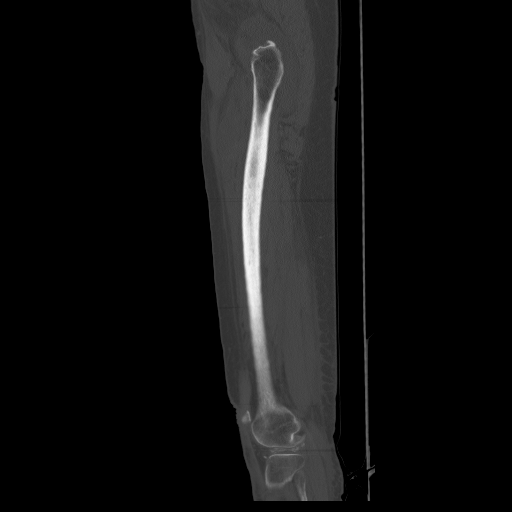
[im 49/98  soft-tissue]
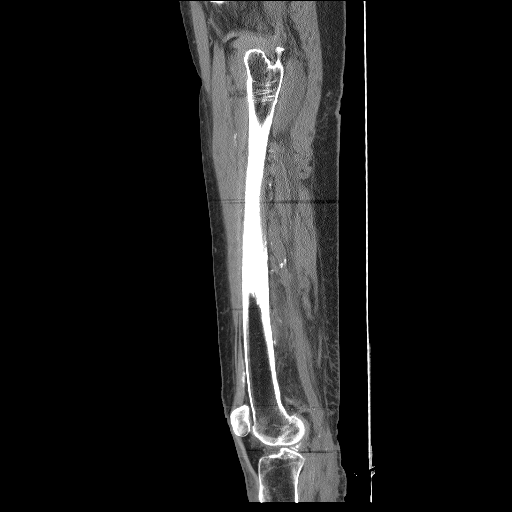
[im 49/98  bone]
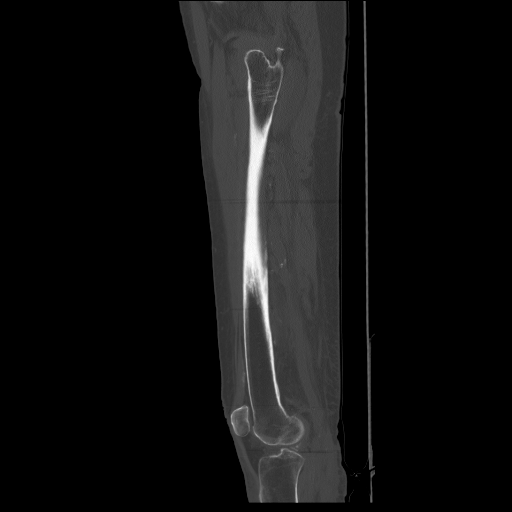
[im 57/98  bone]
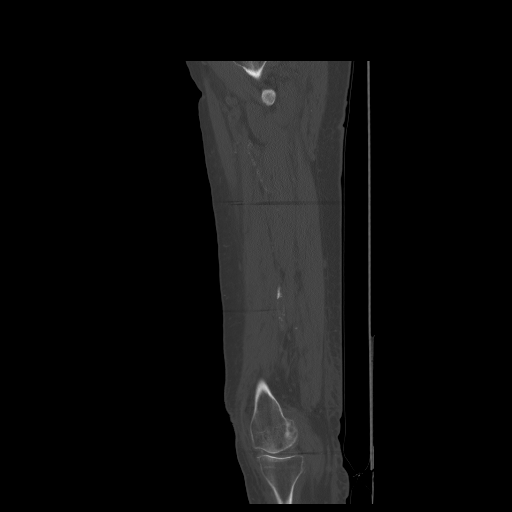
[im 65/98  bone]
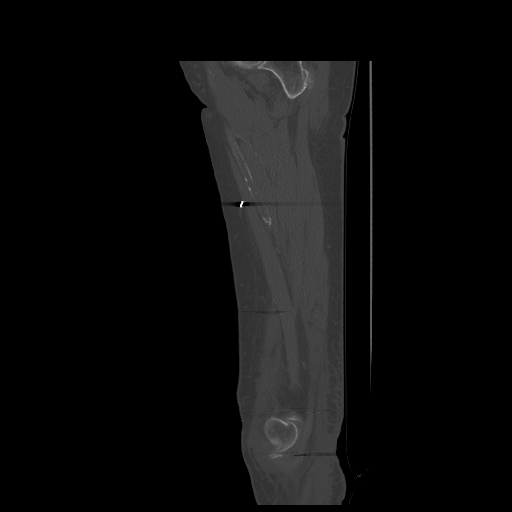

[Series 104: cor · coronal · 1.08mm/px · 3 of 90 slices shown]
[im 18/90  bone]
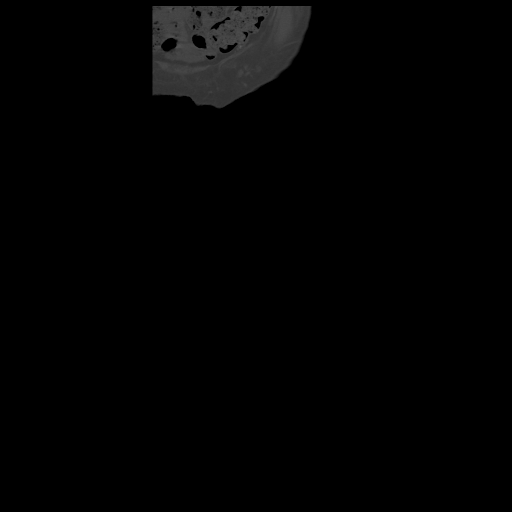
[im 36/90  bone]
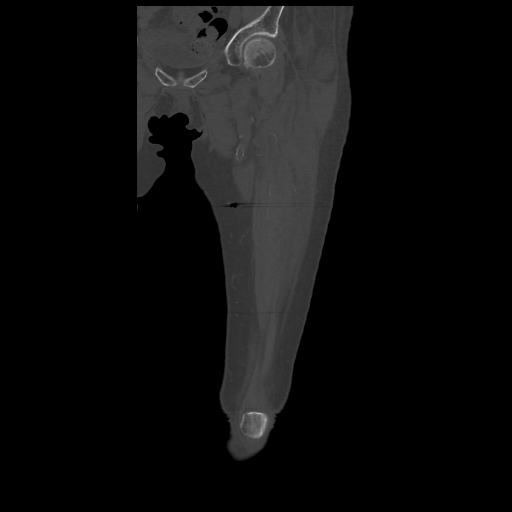
[im 54/90  bone]
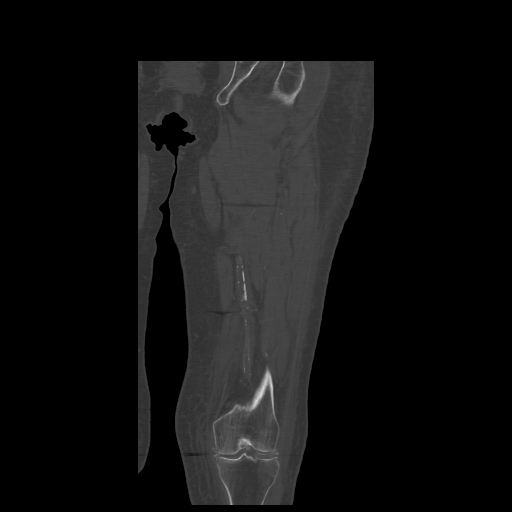

[17 of 35 positions shown; findings below may reference images not displayed]

FINDINGS: There are partially liquefied hematomas involving the
left hip and thigh musculature.  The gluteus medius and minimus
muscles are atrophied bilaterally with fatty change.  There is a
hematoma in the region of the gluteus medius muscle.  There is also
a hematoma involving the quadratus for [place] muscle on the left
and the rectus femoris muscle on the left.  Similar changes in the
subcutaneous fat is also likely hematoma.

No significant bony findings.  The femur is intact.  The hip and
knee demonstrate degenerative changes with chondrocalcinosis.
Calcified fibroids are noted in the pelvis.
IMPRESSION: Partially liquefied hematomas in the left hip and thigh
musculature.

## 2013-08-25 ENCOUNTER — Ambulatory Visit (INDEPENDENT_AMBULATORY_CARE_PROVIDER_SITE_OTHER): Payer: Medicare Other | Admitting: *Deleted

## 2013-08-25 DIAGNOSIS — Z5181 Encounter for therapeutic drug level monitoring: Secondary | ICD-10-CM

## 2013-08-25 DIAGNOSIS — I4891 Unspecified atrial fibrillation: Secondary | ICD-10-CM

## 2013-08-25 DIAGNOSIS — Z7901 Long term (current) use of anticoagulants: Secondary | ICD-10-CM

## 2013-08-25 LAB — POCT INR: INR: 1.9

## 2013-09-22 ENCOUNTER — Ambulatory Visit (INDEPENDENT_AMBULATORY_CARE_PROVIDER_SITE_OTHER): Payer: Medicare Other | Admitting: Pharmacist

## 2013-09-22 DIAGNOSIS — Z5181 Encounter for therapeutic drug level monitoring: Secondary | ICD-10-CM

## 2013-09-22 DIAGNOSIS — I4891 Unspecified atrial fibrillation: Secondary | ICD-10-CM

## 2013-09-22 DIAGNOSIS — Z7901 Long term (current) use of anticoagulants: Secondary | ICD-10-CM

## 2013-09-22 LAB — POCT INR: INR: 2

## 2013-10-07 ENCOUNTER — Ambulatory Visit: Payer: Medicare Other | Admitting: Podiatry

## 2013-10-20 ENCOUNTER — Ambulatory Visit (INDEPENDENT_AMBULATORY_CARE_PROVIDER_SITE_OTHER): Payer: Medicare Other | Admitting: *Deleted

## 2013-10-20 DIAGNOSIS — Z5181 Encounter for therapeutic drug level monitoring: Secondary | ICD-10-CM | POA: Diagnosis not present

## 2013-10-20 DIAGNOSIS — I4891 Unspecified atrial fibrillation: Secondary | ICD-10-CM | POA: Diagnosis not present

## 2013-10-20 DIAGNOSIS — Z7901 Long term (current) use of anticoagulants: Secondary | ICD-10-CM

## 2013-10-20 LAB — POCT INR: INR: 2.2

## 2013-10-26 ENCOUNTER — Encounter: Payer: Self-pay | Admitting: Podiatry

## 2013-10-26 ENCOUNTER — Ambulatory Visit (INDEPENDENT_AMBULATORY_CARE_PROVIDER_SITE_OTHER): Payer: Medicare Other | Admitting: Podiatry

## 2013-10-26 ENCOUNTER — Ambulatory Visit: Payer: Medicare Other | Admitting: Podiatry

## 2013-10-26 VITALS — BP 124/67 | HR 77 | Resp 12

## 2013-10-26 DIAGNOSIS — B351 Tinea unguium: Secondary | ICD-10-CM

## 2013-10-26 DIAGNOSIS — M79609 Pain in unspecified limb: Secondary | ICD-10-CM

## 2013-10-26 DIAGNOSIS — M79676 Pain in unspecified toe(s): Secondary | ICD-10-CM

## 2013-10-27 NOTE — Progress Notes (Signed)
She presents today with a chief complaint of painful elongated toenails one through 5 bilateral.  Objective: Nails are thick yellow dystrophic with mycotic and painful palpation.  Assessment: Pain in limb secondary to onychomycosis 1 through 5 bilateral.  Plan: Treatment of nails 1 through 5 bilateral covered service secondary to pain.

## 2013-11-09 ENCOUNTER — Other Ambulatory Visit: Payer: Self-pay | Admitting: Cardiology

## 2013-11-11 DIAGNOSIS — I10 Essential (primary) hypertension: Secondary | ICD-10-CM | POA: Diagnosis not present

## 2013-11-11 DIAGNOSIS — E119 Type 2 diabetes mellitus without complications: Secondary | ICD-10-CM | POA: Diagnosis not present

## 2013-11-11 DIAGNOSIS — E7800 Pure hypercholesterolemia, unspecified: Secondary | ICD-10-CM | POA: Diagnosis not present

## 2013-11-23 ENCOUNTER — Other Ambulatory Visit: Payer: Self-pay | Admitting: Cardiology

## 2013-11-24 ENCOUNTER — Ambulatory Visit (INDEPENDENT_AMBULATORY_CARE_PROVIDER_SITE_OTHER): Payer: Medicare Other | Admitting: Surgery

## 2013-11-24 DIAGNOSIS — Z7901 Long term (current) use of anticoagulants: Secondary | ICD-10-CM

## 2013-11-24 DIAGNOSIS — I4891 Unspecified atrial fibrillation: Secondary | ICD-10-CM | POA: Diagnosis not present

## 2013-11-24 DIAGNOSIS — Z5181 Encounter for therapeutic drug level monitoring: Secondary | ICD-10-CM | POA: Diagnosis not present

## 2013-11-24 LAB — POCT INR: INR: 2.2

## 2013-12-14 ENCOUNTER — Encounter: Payer: Self-pay | Admitting: *Deleted

## 2013-12-15 ENCOUNTER — Encounter: Payer: Self-pay | Admitting: Cardiology

## 2013-12-15 ENCOUNTER — Ambulatory Visit (INDEPENDENT_AMBULATORY_CARE_PROVIDER_SITE_OTHER): Payer: Medicare Other | Admitting: Cardiology

## 2013-12-15 VITALS — BP 110/50 | HR 70 | Ht 64.0 in | Wt 125.0 lb

## 2013-12-15 DIAGNOSIS — I25810 Atherosclerosis of coronary artery bypass graft(s) without angina pectoris: Secondary | ICD-10-CM | POA: Diagnosis not present

## 2013-12-15 DIAGNOSIS — R0602 Shortness of breath: Secondary | ICD-10-CM

## 2013-12-15 DIAGNOSIS — I059 Rheumatic mitral valve disease, unspecified: Secondary | ICD-10-CM

## 2013-12-15 DIAGNOSIS — I5032 Chronic diastolic (congestive) heart failure: Secondary | ICD-10-CM

## 2013-12-15 DIAGNOSIS — I340 Nonrheumatic mitral (valve) insufficiency: Secondary | ICD-10-CM

## 2013-12-15 DIAGNOSIS — I951 Orthostatic hypotension: Secondary | ICD-10-CM

## 2013-12-15 DIAGNOSIS — I4891 Unspecified atrial fibrillation: Secondary | ICD-10-CM | POA: Diagnosis not present

## 2013-12-15 DIAGNOSIS — I509 Heart failure, unspecified: Secondary | ICD-10-CM

## 2013-12-15 DIAGNOSIS — I480 Paroxysmal atrial fibrillation: Secondary | ICD-10-CM

## 2013-12-15 LAB — BASIC METABOLIC PANEL
BUN: 29 mg/dL — ABNORMAL HIGH (ref 6–23)
CO2: 31 mEq/L (ref 19–32)
Calcium: 9.2 mg/dL (ref 8.4–10.5)
Chloride: 105 mEq/L (ref 96–112)
Creatinine, Ser: 1.2 mg/dL (ref 0.4–1.2)
GFR: 45.57 mL/min — ABNORMAL LOW (ref >60.00)
Glucose, Bld: 122 mg/dL — ABNORMAL HIGH (ref 70–99)
Potassium: 4.4 mEq/L (ref 3.5–5.1)
Sodium: 141 mEq/L (ref 135–145)

## 2013-12-15 LAB — BRAIN NATRIURETIC PEPTIDE: PRO B NATRI PEPTIDE: 429 pg/mL — AB (ref 0.0–100.0)

## 2013-12-15 MED ORDER — BISOPROLOL FUMARATE 10 MG PO TABS: 10.0000 mg | ORAL_TABLET | Freq: Every day | ORAL | Status: DC

## 2013-12-15 NOTE — Progress Notes (Signed)
Patient ID: Judith Zuniga, female   DOB: 09/12/1920, 80 y.o.   MRN: [id number] PCP: Dr. Maudie Mercury  80 yo with a history of atrial fibrillation s/p AV nodal ablation, CAD s/p CABG, and ischemic cardiomyopathy presents for cardiology followup.  She had initial CABG in 1985 with redo in 1998. She developed an ischemic cardiomyopathy with EF 25%.  However, with biventricular pacing, her EF had improved to 50-55% on last echo in 8/13. That echo did show moderate to severe mitral regurgitation and severe TR.  She has had vascular evaluation by Dr. Donnetta Hutching and has been found to have significant below the knee PAD bilaterally.    She lives alone and functions independently despite moderate dementia. Daughter is here with her today.  She has stable dyspnea after walking about 1 block.  She denies dyspnea with housework or with walking on flat ground.  No chest pain.  She denies claudication.  No lightheadedness or falls.  She has not been remembering to take her pm Coreg dose.   ECG: atrial fibrillation with v-pacing  Labs (12/11): K 4.7, creatinine 1.4, HCT 31.6 Labs (8/12): LDL 75, HDL 52 Labs (9/12): K 4, creatinine 1.2, pro-BNP 200, LDL 75, HDL 52 Labs (4/13): K 4, creatinine 1.3, LDL 63 Labs (8/13): LDL 75, HDL 57, BNP 275 Labs (9/13): K 3.8, creatinine 1.3 Labs (11/13): K 3.9, creatinine 0.9, BNP 365 Labs (6/14): K 4.3, creatinine 1.0, LDL 90, HDL 57 Labs (10/14): LDL 98, LDL particle number 1087, K 4.6, creatinine 1.2 Labs (2/15): LDL 88, HDL 55, K 4.1, creatinine 1.1  PMH: 1. Atrial fibrillation: Chronic, s/p AV nodal ablation 2. Ischemic CMP: EF as low as 25%.  After CRT, EF improved.  Last echo (8/13): EF 50-55%, moderate to severe central MR, severe TR, PA systolic pressure 49 mmHg.  3.  St. Jude CRT device 4. CAD: s/p CABG 1985 with LIMA-LAD, SVG-PDA, SVG-PLV. Redo CABG 1998 with RIMA-D1 and D2, SVG-PDA, SVG-PLV, SVG-CFX.   5. Hyperlipidemia 6. Peripheral neuropathy.  7. Mitral regurgitation:  Moderate to severe by 8/13 echo.  Possibly due to annular dilatation.  8. Carotid stenosis: Dopplers (7/12) with 40-59% bilateral stenosis.  Dopplers (7/13) with 40-59% bilateral stenosis.  Dopplers (7/14) with 40-59% bilateral stenosis.  9. PAD: Chronic ulcer dorsum left foot (now mostly healed).  Evaluation by Dr. Donnetta Hutching in 4/13 with good circulation to the popliteal arteries but right TBI 0.35 and left TBI 0.32 (significant below the knee PAD).  10. 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  11. Dementia: Moderate.   SH: Lives alone in [place]. Retired.  Nonsmoker, no ETOH.    FH: Mother died at 43, h/o MI.  Father with probable sudden cardiac death at 55.   ROS: All systems reviewed and negative except as per HPI.    Current Outpatient Prescriptions  Medication Sig Dispense Refill  . atorvastatin (LIPITOR) 40 MG tablet TAKE ONE TABLET BY MOUTH ONCE DAILY  30 tablet  3  . furosemide (LASIX) 40 MG tablet TAKE1/2 TABLET BY MOUTH EVERY DAY      . glimepiride (AMARYL) 1 MG tablet Take 1 mg by mouth daily with breakfast.       . losartan (COZAAR) 50 MG tablet Take 50 mg by mouth daily.       . Memantine HCl ER (NAMENDA XR) 28 MG CP24 Take 1 capsule by mouth daily.      . potassium chloride (K-DUR) 10 MEQ tablet Take 1 tablet (10 mEq total) by  mouth daily.  90 tablet  3  . warfarin (COUMADIN) 5 MG tablet TAKE AS DIRECTED BY MOUTH BY COUMADIN CLINIC  100 tablet  0  . bisoprolol (ZEBETA) 10 MG tablet Take 1 tablet (10 mg total) by mouth daily.  30 tablet  6   No current facility-administered medications for this visit.    BP 110/50  Pulse 70  Ht 5\' 4"  (1.626 m)  Wt 125 lb (56.7 kg)  BMI 21.45 kg/m2 General: NAD, elderly Neck: JVP 7-8 cm, no thyromegaly or thyroid nodule.  Lungs: Clear to auscultation bilaterally with normal respiratory effort. CV: Nondisplaced PMI.  Heart regular S1/S2, no S3/S4, 3/6 HSM at apex/LLSB.  No edema.  No carotid bruit.    Abdomen: Soft, nontender, no hepatosplenomegaly, no distention.  Neurologic: Alert and oriented x 3.  Psych: Normal affect. Extremities: No clubbing or cyanosis.  Assessment/Plan:  ATRIAL FIBRILLATION S/p AV nodal ablation with BIV-pacing. No recent falls. She is on coumadin with INR goal 2-2.5. She has been using her cane.  PAD Below-the-knee PAD bilaterally. No pedal ulcerations.  No significant claudication. CAD Stable s/p CABG and redo CABG with no ischemic symptoms. Continue Coreg, ARB, and statin. She is not on aspirin given warfarin use with stable CAD.  CAROTID ARTERY DISEASE  Stable mild to moderate disease on dopplers in 7/14. This has been stable.  Would hold off on repeat studies at this point.  Chronic diastolic heart failure  NYHA class II symptoms.  She appears near-euvolemic.  Continue Lasix 20 mg daily.  BMET/BNP today.  She has not been remembering to take her pm Coreg dose.  I will stop Coreg and start her on bisoprolol 10 mg daily.  HYPERLIPIDEMIA Continue statin.  Goal LDL < 70.    Mitral regurgitation  Moderate to severe on last echo. At 80 years old with 2 prior sternotomies, she will not be a candidate for repeat heart surgery. Will manage CHF medically. Continue losartan for afterload reduction. Orthostatic lightheadedness Seems to be resolved.     [name] 12/15/2013

## 2013-12-15 NOTE — Patient Instructions (Signed)
Stop coreg (carvedilol).   Start bisoprolol 10mg  daily.   Your physician recommends that you have lab today--BMET/BNP.  Your physician wants you to follow-up in: 6 months with Dr Aundra Dubin. (March 2016).  You will receive a reminder letter in the mail two months in advance. If you don't receive a letter, please call our office to schedule the follow-up appointment.

## 2013-12-24 DIAGNOSIS — L02619 Cutaneous abscess of unspecified foot: Secondary | ICD-10-CM | POA: Diagnosis not present

## 2013-12-30 ENCOUNTER — Ambulatory Visit (INDEPENDENT_AMBULATORY_CARE_PROVIDER_SITE_OTHER): Payer: Medicare Other | Admitting: *Deleted

## 2013-12-30 DIAGNOSIS — Z5181 Encounter for therapeutic drug level monitoring: Secondary | ICD-10-CM

## 2013-12-30 DIAGNOSIS — I509 Heart failure, unspecified: Secondary | ICD-10-CM | POA: Diagnosis not present

## 2013-12-30 DIAGNOSIS — I482 Chronic atrial fibrillation, unspecified: Secondary | ICD-10-CM

## 2013-12-30 DIAGNOSIS — Z7901 Long term (current) use of anticoagulants: Secondary | ICD-10-CM | POA: Diagnosis not present

## 2013-12-30 DIAGNOSIS — I4891 Unspecified atrial fibrillation: Secondary | ICD-10-CM

## 2013-12-30 DIAGNOSIS — I5032 Chronic diastolic (congestive) heart failure: Secondary | ICD-10-CM

## 2013-12-30 LAB — MDC_IDC_ENUM_SESS_TYPE_INCLINIC
Brady Statistic RV Percent Paced: 99 %
Implantable Pulse Generator Model: 5586
Implantable Pulse Generator Serial Number: 1989073
Lead Channel Impedance Value: 514 Ohm
Lead Channel Impedance Value: 762 Ohm
Lead Channel Pacing Threshold Amplitude: 1 V
Lead Channel Pacing Threshold Amplitude: 1 V
Lead Channel Pacing Threshold Pulse Width: 0.6 ms
Lead Channel Setting Pacing Amplitude: 2.5 V
Lead Channel Setting Pacing Pulse Width: 0.4 ms
MDC IDC MSMT LEADCHNL RV PACING THRESHOLD PULSEWIDTH: 0.4 ms
MDC IDC SET LEADCHNL LV PACING AMPLITUDE: 2 V
MDC IDC SET LEADCHNL LV PACING PULSEWIDTH: 0.6 ms
MDC IDC SET LEADCHNL RV SENSING SENSITIVITY: 4 mV

## 2013-12-30 LAB — POCT INR: INR: 2.3

## 2013-12-30 NOTE — Progress Notes (Signed)
CRT-P device check in clinic. Normal device function. Threshold and impedances consistent with previous measurements. Histograms appropriate for patient and level of activity. Permanent AF + Warfarin. No ventricular high rate episodes. Patient bi-ventricularly pacing >99% of the time. Device programmed with appropriate safety margins.  Estimated longevity 4.5 years. Patient will follow up with [facility] in 06-2014.

## 2014-01-04 DIAGNOSIS — M25579 Pain in unspecified ankle and joints of unspecified foot: Secondary | ICD-10-CM | POA: Diagnosis not present

## 2014-01-05 ENCOUNTER — Other Ambulatory Visit: Payer: Self-pay | Admitting: Cardiology

## 2014-01-20 ENCOUNTER — Encounter: Payer: Self-pay | Admitting: Internal Medicine

## 2014-01-27 DIAGNOSIS — L97511 Non-pressure chronic ulcer of other part of right foot limited to breakdown of skin: Secondary | ICD-10-CM | POA: Diagnosis not present

## 2014-02-01 ENCOUNTER — Encounter: Payer: Self-pay | Admitting: Podiatry

## 2014-02-01 ENCOUNTER — Ambulatory Visit (INDEPENDENT_AMBULATORY_CARE_PROVIDER_SITE_OTHER): Payer: Medicare Other | Admitting: Podiatry

## 2014-02-01 DIAGNOSIS — B351 Tinea unguium: Secondary | ICD-10-CM

## 2014-02-01 DIAGNOSIS — M79676 Pain in unspecified toe(s): Secondary | ICD-10-CM | POA: Diagnosis not present

## 2014-02-01 DIAGNOSIS — I739 Peripheral vascular disease, unspecified: Secondary | ICD-10-CM

## 2014-02-01 NOTE — Patient Instructions (Signed)
Diabetes and Foot Care Diabetes may cause you to have problems because of poor blood supply (circulation) to your feet and legs. This may cause the skin on your feet to become thinner, break easier, and heal more slowly. Your skin may become dry, and the skin may peel and crack. You may also have nerve damage in your legs and feet causing decreased feeling in them. You may not notice minor injuries to your feet that could lead to infections or more serious problems. Taking care of your feet is one of the most important things you can do for yourself.  HOME CARE INSTRUCTIONS  Wear shoes at all times, even in the house. Do not go barefoot. Bare feet are easily injured.  Check your feet daily for blisters, cuts, and redness. If you cannot see the bottom of your feet, use a mirror or ask someone for help.  Wash your feet with warm water (do not use hot water) and mild soap. Then pat your feet and the areas between your toes until they are completely dry. Do not soak your feet as this can dry your skin.  Apply a moisturizing lotion or petroleum jelly (that does not contain alcohol and is unscented) to the skin on your feet and to dry, brittle toenails. Do not apply lotion between your toes.  Trim your toenails straight across. Do not dig under them or around the cuticle. File the edges of your nails with an emery board or nail file.  Do not cut corns or calluses or try to remove them with medicine.  Wear clean socks or stockings every day. Make sure they are not too tight. Do not wear knee-high stockings since they may decrease blood flow to your legs.  Wear shoes that fit properly and have enough cushioning. To break in new shoes, wear them for just a few hours a day. This prevents you from injuring your feet. Always look in your shoes before you put them on to be sure there are no objects inside.  Do not cross your legs. This may decrease the blood flow to your feet.  If you find a minor scrape,  cut, or break in the skin on your feet, keep it and the skin around it clean and dry. These areas may be cleansed with mild soap and water. Do not cleanse the area with peroxide, alcohol, or iodine.  When you remove an adhesive bandage, be sure not to damage the skin around it.  If you have a wound, look at it several times a day to make sure it is healing.  Do not use heating pads or hot water bottles. They may burn your skin. If you have lost feeling in your feet or legs, you may not know it is happening until it is too late.  Make sure your health care provider performs a complete foot exam at least annually or more often if you have foot problems. Report any cuts, sores, or bruises to your health care provider immediately. SEEK MEDICAL CARE IF:   You have an injury that is not healing.  You have cuts or breaks in the skin.  You have an ingrown nail.  You notice redness on your legs or feet.  You feel burning or tingling in your legs or feet.  You have pain or cramps in your legs and feet.  Your legs or feet are numb.  Your feet always feel cold. SEEK IMMEDIATE MEDICAL CARE IF:   There is increasing redness,   swelling, or pain in or around a wound.  There is a red line that goes up your leg.  Pus is coming from a wound.  You develop a fever or as directed by your health care provider.  You notice a bad smell coming from an ulcer or wound. Document Released: 03/29/2000 Document Revised: 12/02/2012 Document Reviewed: 09/08/2012 Exit[employer] Patient Information 2015 [employer]. This information is not intended to replace advice given to you by your health care provider. Make sure you discuss any questions you have with your health care provider.  

## 2014-02-01 NOTE — Progress Notes (Signed)
   Subjective:    Patient ID: Judith Zuniga, female    DOB: 12/02/1920, 80 y.o.   MRN: [id number]  HPI Comments: Presents today chief complaint of painful elongated toenails pain nocturnal symptoms right foot.  Objective: Pulses are non- palpable bilateral. Right is worse with areas of erythema.nails are thick, yellow dystrophic onychomycosis and painful palpation.   Assessment: Onychomycosis with pain in limb. Peripheral arterial disease right foot  Plan: Treatment of nails in thickness and length as covered service secondary to pain. Send for vascular evaluation.        Review of Systems     Objective:   Physical Exam        Assessment & Plan:

## 2014-02-01 NOTE — Addendum Note (Signed)
Addended by: Rip Harbour on: 02/01/2014 04:22 PM   Modules accepted: Orders

## 2014-02-03 ENCOUNTER — Telehealth (HOSPITAL_COMMUNITY): Payer: Self-pay | Admitting: *Deleted

## 2014-02-08 ENCOUNTER — Telehealth: Payer: Self-pay | Admitting: *Deleted

## 2014-02-08 NOTE — Telephone Encounter (Signed)
"  I calling in reference to my mother.  She's a patient of Dr. Stephenie Acres.  I have been trying to schedule an appointment at [facility].  I called them Thursday afternoon and 3 times on Friday.  I've called already today.  I'd appreciated if someone would return my phone call.  I could really use some help with this.  I've scheduled an appointment for my mother for Friday morning at [facility].  She cannot get in, I've left a message that my mother cannot make it at that time.  I've left messages, I'm not getting any response!  So I'm going through you, maybe you can make some contact and find out why.  My mother is 80 years old.  She is in a panic about her foot condition!  I'd appreciate it if someone would give me a call.  Thank you!"  I returned her call.  She stated, "After 7 calls to [facility] I finally got through to someone and everything has been taken care of.  Thanks for returning my call."

## 2014-02-10 ENCOUNTER — Ambulatory Visit (INDEPENDENT_AMBULATORY_CARE_PROVIDER_SITE_OTHER): Payer: Medicare Other | Admitting: *Deleted

## 2014-02-10 DIAGNOSIS — I4891 Unspecified atrial fibrillation: Secondary | ICD-10-CM

## 2014-02-10 DIAGNOSIS — Z5181 Encounter for therapeutic drug level monitoring: Secondary | ICD-10-CM

## 2014-02-10 DIAGNOSIS — Z7901 Long term (current) use of anticoagulants: Secondary | ICD-10-CM | POA: Diagnosis not present

## 2014-02-10 LAB — POCT INR: INR: 2

## 2014-02-11 ENCOUNTER — Ambulatory Visit (HOSPITAL_COMMUNITY)
Admission: RE | Admit: 2014-02-11 | Discharge: 2014-02-11 | Disposition: A | Discharge status: Home / Self Care | Payer: Medicare Other | Source: Ambulatory Visit | Attending: Cardiology | Admitting: Cardiology

## 2014-02-11 ENCOUNTER — Encounter (HOSPITAL_COMMUNITY): Payer: Medicare Other

## 2014-02-11 DIAGNOSIS — I739 Peripheral vascular disease, unspecified: Secondary | ICD-10-CM | POA: Diagnosis not present

## 2014-02-11 NOTE — Progress Notes (Signed)
Lower Extremity Arterial Duplex Completed. °Brianna L Mazza,RVT °

## 2014-02-16 ENCOUNTER — Other Ambulatory Visit: Payer: Self-pay | Admitting: Cardiology

## 2014-02-16 ENCOUNTER — Other Ambulatory Visit: Payer: Self-pay | Admitting: *Deleted

## 2014-02-16 DIAGNOSIS — Z23 Encounter for immunization: Secondary | ICD-10-CM | POA: Diagnosis not present

## 2014-02-16 MED ORDER — WARFARIN SODIUM 5 MG PO TABS: 5.0000 mg | ORAL_TABLET | ORAL | Status: DC

## 2014-02-16 MED ORDER — FUROSEMIDE 40 MG PO TABS: ORAL_TABLET | ORAL | Status: DC

## 2014-02-24 DIAGNOSIS — E11621 Type 2 diabetes mellitus with foot ulcer: Secondary | ICD-10-CM | POA: Diagnosis not present

## 2014-02-24 DIAGNOSIS — L03115 Cellulitis of right lower limb: Secondary | ICD-10-CM | POA: Diagnosis not present

## 2014-02-24 DIAGNOSIS — M79671 Pain in right foot: Secondary | ICD-10-CM | POA: Diagnosis not present

## 2014-03-08 DIAGNOSIS — I10 Essential (primary) hypertension: Secondary | ICD-10-CM | POA: Diagnosis not present

## 2014-03-08 DIAGNOSIS — E118 Type 2 diabetes mellitus with unspecified complications: Secondary | ICD-10-CM | POA: Diagnosis not present

## 2014-03-14 DIAGNOSIS — E119 Type 2 diabetes mellitus without complications: Secondary | ICD-10-CM | POA: Diagnosis not present

## 2014-03-14 DIAGNOSIS — I10 Essential (primary) hypertension: Secondary | ICD-10-CM | POA: Diagnosis not present

## 2014-03-14 DIAGNOSIS — E780 Pure hypercholesterolemia: Secondary | ICD-10-CM | POA: Diagnosis not present

## 2014-03-15 ENCOUNTER — Encounter: Payer: Self-pay | Admitting: Cardiology

## 2014-03-24 ENCOUNTER — Ambulatory Visit (INDEPENDENT_AMBULATORY_CARE_PROVIDER_SITE_OTHER): Payer: Medicare Other | Admitting: *Deleted

## 2014-03-24 DIAGNOSIS — I4891 Unspecified atrial fibrillation: Secondary | ICD-10-CM | POA: Diagnosis not present

## 2014-03-24 DIAGNOSIS — Z5181 Encounter for therapeutic drug level monitoring: Secondary | ICD-10-CM | POA: Diagnosis not present

## 2014-03-24 DIAGNOSIS — Z7901 Long term (current) use of anticoagulants: Secondary | ICD-10-CM

## 2014-03-24 LAB — POCT INR: INR: 1.9

## 2014-04-04 DIAGNOSIS — I70261 Atherosclerosis of native arteries of extremities with gangrene, right leg: Secondary | ICD-10-CM | POA: Diagnosis not present

## 2014-04-04 DIAGNOSIS — I70211 Atherosclerosis of native arteries of extremities with intermittent claudication, right leg: Secondary | ICD-10-CM | POA: Diagnosis not present

## 2014-04-25 ENCOUNTER — Other Ambulatory Visit (HOSPITAL_COMMUNITY): Payer: Self-pay | Admitting: Orthopedic Surgery

## 2014-04-25 DIAGNOSIS — I70261 Atherosclerosis of native arteries of extremities with gangrene, right leg: Secondary | ICD-10-CM | POA: Diagnosis not present

## 2014-04-25 DIAGNOSIS — I70211 Atherosclerosis of native arteries of extremities with intermittent claudication, right leg: Secondary | ICD-10-CM

## 2014-04-26 ENCOUNTER — Ambulatory Visit (HOSPITAL_COMMUNITY)
Admission: RE | Admit: 2014-04-26 | Discharge: 2014-04-26 | Disposition: A | Discharge status: Home / Self Care | Payer: Medicare Other | Source: Ambulatory Visit | Attending: Orthopedic Surgery | Admitting: Orthopedic Surgery

## 2014-04-26 ENCOUNTER — Other Ambulatory Visit: Payer: Self-pay | Admitting: Radiology

## 2014-04-26 DIAGNOSIS — I10 Essential (primary) hypertension: Secondary | ICD-10-CM | POA: Diagnosis not present

## 2014-04-26 DIAGNOSIS — E785 Hyperlipidemia, unspecified: Secondary | ICD-10-CM | POA: Insufficient documentation

## 2014-04-26 DIAGNOSIS — I998 Other disorder of circulatory system: Secondary | ICD-10-CM | POA: Insufficient documentation

## 2014-04-26 DIAGNOSIS — I255 Ischemic cardiomyopathy: Secondary | ICD-10-CM | POA: Diagnosis not present

## 2014-04-26 DIAGNOSIS — I482 Chronic atrial fibrillation: Secondary | ICD-10-CM | POA: Diagnosis not present

## 2014-04-26 DIAGNOSIS — I2510 Atherosclerotic heart disease of native coronary artery without angina pectoris: Secondary | ICD-10-CM | POA: Diagnosis not present

## 2014-04-26 DIAGNOSIS — Z79899 Other long term (current) drug therapy: Secondary | ICD-10-CM | POA: Insufficient documentation

## 2014-04-26 DIAGNOSIS — L97519 Non-pressure chronic ulcer of other part of right foot with unspecified severity: Secondary | ICD-10-CM | POA: Diagnosis not present

## 2014-04-26 DIAGNOSIS — I70211 Atherosclerosis of native arteries of extremities with intermittent claudication, right leg: Secondary | ICD-10-CM

## 2014-04-26 DIAGNOSIS — E119 Type 2 diabetes mellitus without complications: Secondary | ICD-10-CM | POA: Diagnosis not present

## 2014-04-26 DIAGNOSIS — M25571 Pain in right ankle and joints of right foot: Secondary | ICD-10-CM | POA: Diagnosis present

## 2014-04-26 DIAGNOSIS — Z7901 Long term (current) use of anticoagulants: Secondary | ICD-10-CM | POA: Diagnosis not present

## 2014-04-26 DIAGNOSIS — Z5309 Procedure and treatment not carried out because of other contraindication: Secondary | ICD-10-CM | POA: Insufficient documentation

## 2014-04-26 DIAGNOSIS — Z951 Presence of aortocoronary bypass graft: Secondary | ICD-10-CM | POA: Insufficient documentation

## 2014-04-26 DIAGNOSIS — L608 Other nail disorders: Secondary | ICD-10-CM | POA: Diagnosis not present

## 2014-04-26 DIAGNOSIS — Z950 Presence of cardiac pacemaker: Secondary | ICD-10-CM | POA: Diagnosis not present

## 2014-04-26 DIAGNOSIS — L98499 Non-pressure chronic ulcer of skin of other sites with unspecified severity: Secondary | ICD-10-CM | POA: Diagnosis not present

## 2014-04-26 DIAGNOSIS — I70261 Atherosclerosis of native arteries of extremities with gangrene, right leg: Secondary | ICD-10-CM

## 2014-04-26 DIAGNOSIS — I739 Peripheral vascular disease, unspecified: Secondary | ICD-10-CM | POA: Diagnosis not present

## 2014-04-26 LAB — CBC WITH DIFFERENTIAL/PLATELET
BASOS PCT: 1 % (ref 0–1)
Basophils Absolute: 0 10*3/uL (ref 0.0–0.1)
EOS ABS: 0.1 10*3/uL (ref 0.0–0.7)
Eosinophils Relative: 1 % (ref 0–5)
HCT: 35.2 % — ABNORMAL LOW (ref 36.0–46.0)
Hemoglobin: 11.5 g/dL — ABNORMAL LOW (ref 12.0–15.0)
Lymphocytes Relative: 14 % (ref 12–46)
Lymphs Abs: 1.1 10*3/uL (ref 0.7–4.0)
MCH: 30.7 pg (ref 26.0–34.0)
MCHC: 32.7 g/dL (ref 30.0–36.0)
MCV: 94.1 fL (ref 78.0–100.0)
Monocytes Absolute: 0.9 10*3/uL (ref 0.1–1.0)
Monocytes Relative: 11 % (ref 3–12)
NEUTROS ABS: 6.1 10*3/uL (ref 1.7–7.7)
NEUTROS PCT: 73 % (ref 43–77)
PLATELETS: 142 10*3/uL — AB (ref 150–400)
RBC: 3.74 MIL/uL — AB (ref 3.87–5.11)
RDW: 14.3 % (ref 11.5–15.5)
WBC: 8.2 10*3/uL (ref 4.0–10.5)

## 2014-04-26 LAB — BASIC METABOLIC PANEL
Anion gap: 11 (ref 5–15)
BUN: 29 mg/dL — ABNORMAL HIGH (ref 6–23)
CHLORIDE: 104 meq/L (ref 96–112)
CO2: 26 mmol/L (ref 19–32)
Calcium: 9.3 mg/dL (ref 8.4–10.5)
Creatinine, Ser: 1.3 mg/dL — ABNORMAL HIGH (ref 0.50–1.10)
GFR calc Af Amer: 40 mL/min — ABNORMAL LOW (ref >90)
GFR, EST NON AFRICAN AMERICAN: 35 mL/min — AB (ref >90)
GLUCOSE: 123 mg/dL — AB (ref 70–99)
POTASSIUM: 3.8 mmol/L (ref 3.5–5.1)
SODIUM: 141 mmol/L (ref 135–145)

## 2014-04-26 LAB — PROTIME-INR
INR: 2.02 — ABNORMAL HIGH (ref 0.00–1.49)
PROTHROMBIN TIME: 23 s — AB (ref 11.6–15.2)

## 2014-04-26 LAB — APTT: APTT: 41 s — AB (ref 24–37)

## 2014-04-26 LAB — GLUCOSE, CAPILLARY: GLUCOSE-CAPILLARY: 112 mg/dL — AB (ref 70–99)

## 2014-04-26 MED ORDER — SODIUM CHLORIDE 0.9 % IV SOLN
Freq: Once | INTRAVENOUS | Status: AC
  Administered 2014-04-26: 09:00:00 via INTRAVENOUS

## 2014-04-26 MED ORDER — LIDOCAINE HCL 1 % IJ SOLN
INTRAMUSCULAR | Status: AC
  Filled 2014-04-26: qty 20

## 2014-04-26 NOTE — H&P (Signed)
Chief Complaint: Rt foot/ankle pain Abnormal B doppler studies  Referring Physician(s): Duda,Marcus V  History of Present Illness: Judith Zuniga is a 81 y.o. female  Pt has had right foot pain x 6 months Worsening pain and now includes Rt ankle Doppler studies 02/11/14 reveal R posterior tibial and peroneal artery occlusive disease and anterior tibial artery occlusive disease with noted reconstitution. Also L anterior and posterior tibial occlusive disease. Now scheduled for Bilateral lower extremity arteriogram Dr Laurence Ferrari has discussed plan with Dr Sharol Given  Pt on coumadin daily for CAD/CABG hx and atrial fib Last dose 04/25/14 2pm   Past Medical History  Diagnosis Date  . Atrioventricular block, complete s/p AV ablation   . Pacemaker -CRT- [employer]   . Type II or unspecified type diabetes mellitus without mention of complication, not stated as uncontrolled   . CAD (coronary artery disease)     s/p redo bypass surgery in 1998 with patent graft in Jan. 2009.   . Ischemic cardiomyopathy     ischemic heart myopathy , ejection fraction of 25%  . Systolic heart failure     class II and euvolemic  . Permanent atrial fibrillation   . Hyperlipidemia   . Diabetes mellitus   . Premature ventricular contractions     frequent  . Elevated liver function tests     on amiodarone  . Hypertension     Past Surgical History  Procedure Laterality Date  . Coronary artery bypass graft  1998    CABG x5: RIMA to diagonal one, RIMA to diagonal 2, SVG to PD, SVG to PL, SVG to circ  . Coronary artery bypass graft  1985    x3: LIMA to LAD, SVG to PD and SVG to PL  . Cardiac caths      multile  . Pacer generator change out      [employer] pacemaker    Allergies: Review of patient's allergies indicates no known allergies.  Medications: Prior to Admission medications   Medication Sig Start Date End Date Taking? Authorizing Provider  atorvastatin (LIPITOR) 40 MG tablet  01/17/14    Historical Provider, MD  bisoprolol (ZEBETA) 10 MG tablet Take 1 tablet (10 mg total) by mouth daily. 12/15/13   Larey Dresser, MD  furosemide (LASIX) 40 MG tablet TAKE1/2 TABLET BY MOUTH EVERY DAY 02/16/14   Larey Dresser, MD  glimepiride (AMARYL) 1 MG tablet Take 1 mg by mouth daily with breakfast.  06/12/13   Historical Provider, MD  losartan (COZAAR) 50 MG tablet Take 50 mg by mouth daily.  08/23/11   Larey Dresser, MD  memantine [facility]) 10 MG tablet  01/18/14   Historical Provider, MD  Memantine HCl ER (NAMENDA XR) 28 MG CP24 Take 1 capsule by mouth daily.    Historical Provider, MD  potassium chloride (K-DUR) 10 MEQ tablet TAKE ONE TABLET BY MOUTH ONCE DAILY 01/06/14   Larey Dresser, MD  warfarin (COUMADIN) 5 MG tablet Take 1 tablet (5 mg total) by mouth as directed. 02/16/14   Larey Dresser, MD    Family History  Problem Relation Age of Onset  . Heart disease Father   . Other Mother     pacemaker  . Cancer Sister     gallbladder    History   Social History  . Marital Status: Widowed    Spouse Name: N/A    Number of Children: 1  . Years of Education: N/A   Occupational History  .  Retired    Social History Main Topics  . Smoking status: Never Smoker   . Smokeless tobacco: Never Used  . Alcohol Use: No  . Drug Use: No  . Sexual Activity: Not on file   Other Topics Concern  . 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      Review of Systems: A 12 point ROS discussed and pertinent positives are indicated in the HPI above.  All other systems are negative.  Review of Systems  Constitutional: Positive for activity change. Negative for fever and unexpected weight change.  Respiratory: Negative for cough and shortness of breath.   Gastrointestinal: Negative for abdominal pain.  Genitourinary: Negative for difficulty urinating.  Skin: Positive for color change.    Neurological: Positive for weakness.  Psychiatric/Behavioral: Positive for confusion. Negative for behavioral problems.    Vital Signs: BP 149/58 mmHg  Pulse 69  Temp(Src) 97.8 F (36.6 C) (Oral)  Resp 16  Ht 5\' 4"  (1.626 m)  Wt 56.7 kg (125 lb)  BMI 21.45 kg/m2  SpO2 98%  Physical Exam  Cardiovascular: Normal rate and regular rhythm.   Murmur heard. Pulmonary/Chest: Effort normal and breath sounds normal. She has no wheezes.  Abdominal: Soft. Bowel sounds are normal. There is no tenderness.  Musculoskeletal: Normal range of motion.  Rt foot pain  Neurological: She is alert.  Skin: Skin is warm and dry. There is erythema.  Psychiatric: She has a normal mood and affect.  Pt oriented to name and date Cannot tell me place or reason for exam Consented dtr at bedside  Nursing note and vitals reviewed.   Imaging: No results found.  Labs:  CBC: No results for input(s): WBC, HGB, HCT, PLT in the last 8760 hours.  COAGS:  Recent Labs  11/24/13 0923 12/30/13 1609 02/10/14 0906 03/24/14 0914  INR 2.2 2.3 2.0 1.9    BMP:  Recent Labs  12/15/13 1403  NA 141  K 4.4  CL 105  CO2 31  GLUCOSE 122*  BUN 29*  CALCIUM 9.2  CREATININE 1.2    LIVER FUNCTION TESTS: No results for input(s): BILITOT, AST, ALT, ALKPHOS, PROT, ALBUMIN in the last 8760 hours.  TUMOR MARKERS: No results for input(s): AFPTM, CEA, CA199, CHROMGRNA in the last 8760 hours.  Assessment and Plan:  Rt foot pain x 6 months Abnormal lower extremity doppler showing occlusive disease Bilaterally Now scheduled for B lower extremity arteriogram  Checking INR this am Pt and dtr aware of procedure benefits and risks and agreeable to proceed Consent signed andin chart  Thank you for this interesting consult.  I greatly enjoyed meeting Judith Zuniga and look forward to participating in their care.     I spent a total of 20 minutes face to face in clinical consultation, greater than 50% of  which was counseling/coordinating care for B lower extr arteriogram  Signed: Dylan Monforte A 04/26/2014, 8:44 AM

## 2014-05-02 ENCOUNTER — Other Ambulatory Visit: Payer: Self-pay | Admitting: Radiology

## 2014-05-03 ENCOUNTER — Other Ambulatory Visit: Payer: Self-pay | Admitting: Radiology

## 2014-05-04 ENCOUNTER — Other Ambulatory Visit: Payer: Self-pay

## 2014-05-04 ENCOUNTER — Encounter (HOSPITAL_COMMUNITY): Payer: Self-pay | Admitting: *Deleted

## 2014-05-04 ENCOUNTER — Other Ambulatory Visit (HOSPITAL_COMMUNITY): Payer: Self-pay | Admitting: Orthopedic Surgery

## 2014-05-04 ENCOUNTER — Other Ambulatory Visit (HOSPITAL_COMMUNITY): Payer: Medicare Other | Admitting: Orthopedic Surgery

## 2014-05-04 ENCOUNTER — Encounter (HOSPITAL_COMMUNITY): Payer: Self-pay

## 2014-05-04 ENCOUNTER — Ambulatory Visit (HOSPITAL_COMMUNITY)
Admission: RE | Admit: 2014-05-04 | Discharge: 2014-05-04 | Disposition: A | Discharge status: Home / Self Care | Payer: Medicare Other | Source: Ambulatory Visit | Attending: Orthopedic Surgery | Admitting: Orthopedic Surgery

## 2014-05-04 ENCOUNTER — Emergency Department (HOSPITAL_COMMUNITY): Payer: Medicare Other

## 2014-05-04 ENCOUNTER — Inpatient Hospital Stay (HOSPITAL_COMMUNITY)
Admission: EM | Admit: 2014-05-04 | Discharge: 2014-05-15 | DRG: 239 | Disposition: A | Discharge status: Skilled Nursing Facility | Payer: Medicare Other | Attending: Internal Medicine | Admitting: Internal Medicine

## 2014-05-04 VITALS — BP 134/49 | HR 69 | Temp 98.3°F | Resp 18 | Ht 63.0 in | Wt 124.0 lb

## 2014-05-04 DIAGNOSIS — I482 Chronic atrial fibrillation, unspecified: Secondary | ICD-10-CM | POA: Diagnosis present

## 2014-05-04 DIAGNOSIS — I255 Ischemic cardiomyopathy: Secondary | ICD-10-CM | POA: Diagnosis present

## 2014-05-04 DIAGNOSIS — I6523 Occlusion and stenosis of bilateral carotid arteries: Secondary | ICD-10-CM | POA: Diagnosis present

## 2014-05-04 DIAGNOSIS — R29810 Facial weakness: Secondary | ICD-10-CM | POA: Diagnosis not present

## 2014-05-04 DIAGNOSIS — I25810 Atherosclerosis of coronary artery bypass graft(s) without angina pectoris: Secondary | ICD-10-CM | POA: Diagnosis not present

## 2014-05-04 DIAGNOSIS — E119 Type 2 diabetes mellitus without complications: Secondary | ICD-10-CM | POA: Diagnosis present

## 2014-05-04 DIAGNOSIS — D62 Acute posthemorrhagic anemia: Secondary | ICD-10-CM | POA: Diagnosis not present

## 2014-05-04 DIAGNOSIS — I5033 Acute on chronic diastolic (congestive) heart failure: Secondary | ICD-10-CM | POA: Diagnosis present

## 2014-05-04 DIAGNOSIS — T50905A Adverse effect of unspecified drugs, medicaments and biological substances, initial encounter: Secondary | ICD-10-CM | POA: Diagnosis not present

## 2014-05-04 DIAGNOSIS — I639 Cerebral infarction, unspecified: Secondary | ICD-10-CM | POA: Diagnosis not present

## 2014-05-04 DIAGNOSIS — I5020 Unspecified systolic (congestive) heart failure: Secondary | ICD-10-CM | POA: Diagnosis present

## 2014-05-04 DIAGNOSIS — I739 Peripheral vascular disease, unspecified: Secondary | ICD-10-CM | POA: Diagnosis not present

## 2014-05-04 DIAGNOSIS — E1151 Type 2 diabetes mellitus with diabetic peripheral angiopathy without gangrene: Secondary | ICD-10-CM | POA: Insufficient documentation

## 2014-05-04 DIAGNOSIS — E876 Hypokalemia: Secondary | ICD-10-CM | POA: Diagnosis not present

## 2014-05-04 DIAGNOSIS — Z8249 Family history of ischemic heart disease and other diseases of the circulatory system: Secondary | ICD-10-CM

## 2014-05-04 DIAGNOSIS — I110 Hypertensive heart disease with heart failure: Secondary | ICD-10-CM | POA: Diagnosis not present

## 2014-05-04 DIAGNOSIS — R4701 Aphasia: Secondary | ICD-10-CM | POA: Diagnosis not present

## 2014-05-04 DIAGNOSIS — I96 Gangrene, not elsewhere classified: Secondary | ICD-10-CM | POA: Diagnosis present

## 2014-05-04 DIAGNOSIS — I70261 Atherosclerosis of native arteries of extremities with gangrene, right leg: Secondary | ICD-10-CM

## 2014-05-04 DIAGNOSIS — R410 Disorientation, unspecified: Secondary | ICD-10-CM | POA: Diagnosis present

## 2014-05-04 DIAGNOSIS — I70211 Atherosclerosis of native arteries of extremities with intermittent claudication, right leg: Secondary | ICD-10-CM

## 2014-05-04 DIAGNOSIS — E1152 Type 2 diabetes mellitus with diabetic peripheral angiopathy with gangrene: Secondary | ICD-10-CM | POA: Diagnosis not present

## 2014-05-04 DIAGNOSIS — D649 Anemia, unspecified: Secondary | ICD-10-CM | POA: Diagnosis not present

## 2014-05-04 DIAGNOSIS — I5022 Chronic systolic (congestive) heart failure: Secondary | ICD-10-CM | POA: Insufficient documentation

## 2014-05-04 DIAGNOSIS — E0859 Diabetes mellitus due to underlying condition with other circulatory complications: Secondary | ICD-10-CM | POA: Diagnosis not present

## 2014-05-04 DIAGNOSIS — R451 Restlessness and agitation: Secondary | ICD-10-CM | POA: Diagnosis not present

## 2014-05-04 DIAGNOSIS — G9340 Encephalopathy, unspecified: Secondary | ICD-10-CM | POA: Diagnosis not present

## 2014-05-04 DIAGNOSIS — E785 Hyperlipidemia, unspecified: Secondary | ICD-10-CM | POA: Diagnosis present

## 2014-05-04 DIAGNOSIS — I7025 Atherosclerosis of native arteries of other extremities with ulceration: Secondary | ICD-10-CM | POA: Diagnosis not present

## 2014-05-04 DIAGNOSIS — F0390 Unspecified dementia without behavioral disturbance: Secondary | ICD-10-CM | POA: Diagnosis present

## 2014-05-04 DIAGNOSIS — Z950 Presence of cardiac pacemaker: Secondary | ICD-10-CM

## 2014-05-04 DIAGNOSIS — L98499 Non-pressure chronic ulcer of skin of other sites with unspecified severity: Secondary | ICD-10-CM | POA: Diagnosis not present

## 2014-05-04 DIAGNOSIS — L97811 Non-pressure chronic ulcer of other part of right lower leg limited to breakdown of skin: Secondary | ICD-10-CM | POA: Diagnosis not present

## 2014-05-04 DIAGNOSIS — Z7901 Long term (current) use of anticoagulants: Secondary | ICD-10-CM

## 2014-05-04 DIAGNOSIS — IMO0002 Reserved for concepts with insufficient information to code with codable children: Secondary | ICD-10-CM | POA: Insufficient documentation

## 2014-05-04 DIAGNOSIS — I5042 Chronic combined systolic (congestive) and diastolic (congestive) heart failure: Secondary | ICD-10-CM | POA: Diagnosis present

## 2014-05-04 DIAGNOSIS — I2510 Atherosclerotic heart disease of native coronary artery without angina pectoris: Secondary | ICD-10-CM | POA: Diagnosis present

## 2014-05-04 DIAGNOSIS — Z951 Presence of aortocoronary bypass graft: Secondary | ICD-10-CM

## 2014-05-04 DIAGNOSIS — I6789 Other cerebrovascular disease: Secondary | ICD-10-CM | POA: Diagnosis not present

## 2014-05-04 DIAGNOSIS — I509 Heart failure, unspecified: Secondary | ICD-10-CM | POA: Diagnosis not present

## 2014-05-04 DIAGNOSIS — M79671 Pain in right foot: Secondary | ICD-10-CM | POA: Diagnosis not present

## 2014-05-04 DIAGNOSIS — I5032 Chronic diastolic (congestive) heart failure: Secondary | ICD-10-CM | POA: Diagnosis not present

## 2014-05-04 DIAGNOSIS — E1165 Type 2 diabetes mellitus with hyperglycemia: Secondary | ICD-10-CM | POA: Diagnosis present

## 2014-05-04 DIAGNOSIS — M6281 Muscle weakness (generalized): Secondary | ICD-10-CM | POA: Diagnosis not present

## 2014-05-04 DIAGNOSIS — Z89511 Acquired absence of right leg below knee: Secondary | ICD-10-CM | POA: Diagnosis not present

## 2014-05-04 DIAGNOSIS — G8918 Other acute postprocedural pain: Secondary | ICD-10-CM | POA: Diagnosis not present

## 2014-05-04 HISTORY — DX: Unspecified dementia, unspecified severity, without behavioral disturbance, psychotic disturbance, mood disturbance, and anxiety: F03.90

## 2014-05-04 LAB — BASIC METABOLIC PANEL
Anion gap: 10 (ref 5–15)
BUN: 21 mg/dL (ref 6–23)
CALCIUM: 9 mg/dL (ref 8.4–10.5)
CO2: 29 mmol/L (ref 19–32)
CREATININE: 1.15 mg/dL — AB (ref 0.50–1.10)
Chloride: 101 mEq/L (ref 96–112)
GFR calc Af Amer: 47 mL/min — ABNORMAL LOW (ref >90)
GFR calc non Af Amer: 40 mL/min — ABNORMAL LOW (ref >90)
GLUCOSE: 180 mg/dL — AB (ref 70–99)
Potassium: 4 mmol/L (ref 3.5–5.1)
Sodium: 140 mmol/L (ref 135–145)

## 2014-05-04 LAB — I-STAT CHEM 8, ED
BUN: 23 mg/dL (ref 6–23)
CALCIUM ION: 1.15 mmol/L (ref 1.13–1.30)
CHLORIDE: 107 meq/L (ref 96–112)
CREATININE: 1.1 mg/dL (ref 0.50–1.10)
GLUCOSE: 152 mg/dL — AB (ref 70–99)
HCT: 30 % — ABNORMAL LOW (ref 36.0–46.0)
Hemoglobin: 10.2 g/dL — ABNORMAL LOW (ref 12.0–15.0)
Potassium: 4 mmol/L (ref 3.5–5.1)
Sodium: 142 mmol/L (ref 135–145)
TCO2: 22 mmol/L (ref 0–100)

## 2014-05-04 LAB — POCT ACTIVATED CLOTTING TIME
ACTIVATED CLOTTING TIME: 214 s
ACTIVATED CLOTTING TIME: 245 s
Activated Clotting Time: 220 seconds

## 2014-05-04 LAB — CBC WITH DIFFERENTIAL/PLATELET
BASOS ABS: 0 10*3/uL (ref 0.0–0.1)
BASOS PCT: 0 % (ref 0–1)
Basophils Absolute: 0 10*3/uL (ref 0.0–0.1)
Basophils Relative: 0 % (ref 0–1)
EOS ABS: 0 10*3/uL (ref 0.0–0.7)
Eosinophils Absolute: 0 10*3/uL (ref 0.0–0.7)
Eosinophils Relative: 0 % (ref 0–5)
Eosinophils Relative: 0 % (ref 0–5)
HEMATOCRIT: 32 % — AB (ref 36.0–46.0)
HEMATOCRIT: 28.8 % — AB (ref 36.0–46.0)
HEMOGLOBIN: 9.5 g/dL — AB (ref 12.0–15.0)
Hemoglobin: 10.4 g/dL — ABNORMAL LOW (ref 12.0–15.0)
LYMPHS ABS: 0.7 10*3/uL (ref 0.7–4.0)
Lymphocytes Relative: 9 % — ABNORMAL LOW (ref 12–46)
Lymphocytes Relative: 9 % — ABNORMAL LOW (ref 12–46)
Lymphs Abs: 0.8 10*3/uL (ref 0.7–4.0)
MCH: 30.1 pg (ref 26.0–34.0)
MCH: 30.7 pg (ref 26.0–34.0)
MCHC: 32.5 g/dL (ref 30.0–36.0)
MCHC: 33 g/dL (ref 30.0–36.0)
MCV: 92.8 fL (ref 78.0–100.0)
MCV: 93.2 fL (ref 78.0–100.0)
MONO ABS: 0.6 10*3/uL (ref 0.1–1.0)
MONOS PCT: 8 % (ref 3–12)
Monocytes Absolute: 0.6 10*3/uL (ref 0.1–1.0)
Monocytes Relative: 6 % (ref 3–12)
NEUTROS ABS: 7.5 10*3/uL (ref 1.7–7.7)
NEUTROS ABS: 7.1 10*3/uL (ref 1.7–7.7)
Neutrophils Relative %: 85 % — ABNORMAL HIGH (ref 43–77)
Neutrophils Relative %: 83 % — ABNORMAL HIGH (ref 43–77)
PLATELETS: 188 10*3/uL (ref 150–400)
Platelets: 159 10*3/uL (ref 150–400)
RBC: 3.45 MIL/uL — ABNORMAL LOW (ref 3.87–5.11)
RBC: 3.09 MIL/uL — ABNORMAL LOW (ref 3.87–5.11)
RDW: 13.8 % (ref 11.5–15.5)
RDW: 13.9 % (ref 11.5–15.5)
WBC: 8.9 10*3/uL (ref 4.0–10.5)
WBC: 8.4 10*3/uL (ref 4.0–10.5)

## 2014-05-04 LAB — COMPREHENSIVE METABOLIC PANEL
ALT: 23 U/L (ref 0–35)
AST: 60 U/L — ABNORMAL HIGH (ref 0–37)
Albumin: 2.8 g/dL — ABNORMAL LOW (ref 3.5–5.2)
Alkaline Phosphatase: 69 U/L (ref 39–117)
Anion gap: 8 (ref 5–15)
BUN: 21 mg/dL (ref 6–23)
CO2: 27 mmol/L (ref 19–32)
Calcium: 8.7 mg/dL (ref 8.4–10.5)
Chloride: 107 mEq/L (ref 96–112)
Creatinine, Ser: 1.21 mg/dL — ABNORMAL HIGH (ref 0.50–1.10)
GFR calc Af Amer: 44 mL/min — ABNORMAL LOW (ref >90)
GFR calc non Af Amer: 38 mL/min — ABNORMAL LOW (ref >90)
Glucose, Bld: 149 mg/dL — ABNORMAL HIGH (ref 70–99)
POTASSIUM: 4.6 mmol/L (ref 3.5–5.1)
Sodium: 142 mmol/L (ref 135–145)
TOTAL PROTEIN: 6.4 g/dL (ref 6.0–8.3)
Total Bilirubin: 1.5 mg/dL — ABNORMAL HIGH (ref 0.3–1.2)

## 2014-05-04 LAB — APTT: aPTT: 39 seconds — ABNORMAL HIGH (ref 24–37)

## 2014-05-04 LAB — PROTIME-INR
INR: 1.65 — ABNORMAL HIGH (ref 0.00–1.49)
Prothrombin Time: 19.7 seconds — ABNORMAL HIGH (ref 11.6–15.2)

## 2014-05-04 LAB — GLUCOSE, CAPILLARY
Glucose-Capillary: 156 mg/dL — ABNORMAL HIGH (ref 70–99)
Glucose-Capillary: 88 mg/dL (ref 70–99)

## 2014-05-04 MED ORDER — NITROGLYCERIN 1 MG/10 ML FOR IR/CATH LAB
200.0000 ug | INTRA_ARTERIAL | Status: AC
  Administered 2014-05-04: 200 ug via INTRA_ARTERIAL

## 2014-05-04 MED ORDER — HEPARIN SODIUM (PORCINE) 1000 UNIT/ML IJ SOLN
1000.0000 [IU] | Freq: Once | INTRAMUSCULAR | Status: AC
  Administered 2014-05-04: 1000 [IU] via INTRAVENOUS

## 2014-05-04 MED ORDER — HEPARIN SODIUM (PORCINE) 1000 UNIT/ML IJ SOLN
INTRAMUSCULAR | Status: AC
  Administered 2014-05-04: 1000 [IU]
  Filled 2014-05-04: qty 1

## 2014-05-04 MED ORDER — ALTEPLASE 100 MG IV SOLR
2.0000 mg | Freq: Once | INTRAVENOUS | Status: DC
  Administered 2014-05-04: 1 mg

## 2014-05-04 MED ORDER — MIDAZOLAM HCL 2 MG/2ML IJ SOLN
INTRAMUSCULAR | Status: AC | PRN
  Administered 2014-05-04: 1 mg via INTRAVENOUS

## 2014-05-04 MED ORDER — ALTEPLASE 100 MG IV SOLR
2.0000 mg | Freq: Once | INTRAVENOUS | Status: AC
  Administered 2014-05-04: 1 mg
  Filled 2014-05-04: qty 2

## 2014-05-04 MED ORDER — IOHEXOL 300 MG/ML  SOLN: 100.0000 mL | Freq: Once | INTRAMUSCULAR | Status: DC | PRN

## 2014-05-04 MED ORDER — FENTANYL CITRATE 0.05 MG/ML IJ SOLN
INTRAMUSCULAR | Status: AC
  Filled 2014-05-04: qty 6

## 2014-05-04 MED ORDER — SODIUM CHLORIDE 0.9 % IV SOLN
Freq: Once | INTRAVENOUS | Status: AC
  Administered 2014-05-04: 09:00:00 via INTRAVENOUS

## 2014-05-04 MED ORDER — NITROGLYCERIN 1 MG/10 ML FOR IR/CATH LAB
400.0000 ug | INTRA_ARTERIAL | Status: AC
  Administered 2014-05-04: 400 ug via INTRA_ARTERIAL

## 2014-05-04 MED ORDER — NITROGLYCERIN 1 MG/10 ML FOR IR/CATH LAB
INTRA_ARTERIAL | Status: AC
  Administered 2014-05-04: 200 ug
  Filled 2014-05-04: qty 10

## 2014-05-04 MED ORDER — HEPARIN SODIUM (PORCINE) 1000 UNIT/ML IJ SOLN
1500.0000 [IU] | Freq: Once | INTRAMUSCULAR | Status: AC
  Administered 2014-05-04: 1500 [IU] via INTRAVENOUS

## 2014-05-04 MED ORDER — CEFAZOLIN SODIUM-DEXTROSE 2-3 GM-% IV SOLR
INTRAVENOUS | Status: AC
  Administered 2014-05-04: 2000 mg
  Filled 2014-05-04: qty 50

## 2014-05-04 MED ORDER — HEPARIN SODIUM (PORCINE) 1000 UNIT/ML IJ SOLN
INTRAMUSCULAR | Status: AC | PRN
  Administered 2014-05-04: 4000 [IU] via INTRAVENOUS

## 2014-05-04 MED ORDER — IODIXANOL 320 MG/ML IV SOLN
100.0000 mL | Freq: Once | INTRAVENOUS | Status: AC | PRN
  Administered 2014-05-04: 110 mL via INTRAVENOUS

## 2014-05-04 MED ORDER — NITROGLYCERIN 1 MG/10 ML FOR IR/CATH LAB
INTRA_ARTERIAL | Status: AC
  Administered 2014-05-04: 125 ug via INTRA_ARTERIAL
  Filled 2014-05-04: qty 20

## 2014-05-04 MED ORDER — FENTANYL CITRATE 0.05 MG/ML IJ SOLN
INTRAMUSCULAR | Status: AC
  Filled 2014-05-04: qty 4

## 2014-05-04 MED ORDER — NITROGLYCERIN 1 MG/10 ML FOR IR/CATH LAB
150.0000 ug | INTRA_ARTERIAL | Status: AC
  Administered 2014-05-04: 150 ug via INTRA_ARTERIAL

## 2014-05-04 MED ORDER — LIDOCAINE HCL 1 % IJ SOLN
INTRAMUSCULAR | Status: AC
  Filled 2014-05-04: qty 20

## 2014-05-04 MED ORDER — SODIUM CHLORIDE 0.9 % IV SOLN
INTRAVENOUS | Status: AC
Start: 2014-05-04 — End: 2014-05-04

## 2014-05-04 MED ORDER — VERAPAMIL HCL 2.5 MG/ML IV SOLN
INTRAVENOUS | Status: AC
  Filled 2014-05-04: qty 2

## 2014-05-04 MED ORDER — FENTANYL CITRATE 0.05 MG/ML IJ SOLN
INTRAMUSCULAR | Status: AC | PRN
  Administered 2014-05-04 (×3): 25 ug via INTRAVENOUS
  Administered 2014-05-04: 12.5 ug via INTRAVENOUS
  Administered 2014-05-04: 50 ug via INTRAVENOUS
  Administered 2014-05-04 (×3): 25 ug via INTRAVENOUS
  Administered 2014-05-04: 12.5 ug via INTRAVENOUS

## 2014-05-04 MED ORDER — MIDAZOLAM HCL 2 MG/2ML IJ SOLN
INTRAMUSCULAR | Status: AC
  Filled 2014-05-04: qty 6

## 2014-05-04 NOTE — Sedation Documentation (Signed)
ACT 220 

## 2014-05-04 NOTE — H&P (Signed)
Chief Complaint: Rt foot and ankle pain  Referring Physician(s): Duda,Marcus V  History of Present Illness: Judith Zuniga is a 81 y.o. female   Pt has had Rt foot pain over 6 months Worsening pain - now including ankle and shin +redness/ swelling; blistering skin on occasion Abnormal doppler studies 01/2014 Now scheduled for Bilateral lower extremity arteriogram with possible angioplasty/stnet placement Off coumadin x 5 days   Past Medical History  Diagnosis Date  . Atrioventricular block, complete s/p AV ablation   . Pacemaker -CRT- [employer]   . Type II or unspecified type diabetes mellitus without mention of complication, not stated as uncontrolled   . CAD (coronary artery disease)     s/p redo bypass surgery in 1998 with patent graft in Jan. 2009.   . Ischemic cardiomyopathy     ischemic heart myopathy , ejection fraction of 25%  . Systolic heart failure     class II and euvolemic  . Permanent atrial fibrillation   . Hyperlipidemia   . Diabetes mellitus   . Premature ventricular contractions     frequent  . Elevated liver function tests     on amiodarone  . Hypertension     Past Surgical History  Procedure Laterality Date  . Coronary artery bypass graft  1998    CABG x5: RIMA to diagonal one, RIMA to diagonal 2, SVG to PD, SVG to PL, SVG to circ  . Coronary artery bypass graft  1985    x3: LIMA to LAD, SVG to PD and SVG to PL  . Cardiac caths      multile  . Pacer generator change out      [employer] Jude pacemaker    Allergies: Review of patient's allergies indicates no known allergies.  Medications: Prior to Admission medications   Medication Sig Start Date End Date Taking? Authorizing Provider  atorvastatin (LIPITOR) 40 MG tablet Take 40 mg by mouth daily.  01/17/14   Historical Provider, MD  bisoprolol (ZEBETA) 10 MG tablet Take 1 tablet (10 mg total) by mouth daily. 12/15/13   Larey Dresser, MD  furosemide (LASIX) 40 MG tablet TAKE1/2 TABLET BY MOUTH  EVERY DAY 02/16/14   Larey Dresser, MD  glimepiride (AMARYL) 1 MG tablet Take 1 mg by mouth daily with breakfast.  06/12/13   Historical Provider, MD  HYDROcodone-acetaminophen (NORCO/VICODIN) 5-325 MG per tablet Take 1 tablet by mouth every 6 (six) hours as needed for moderate pain.    Historical Provider, MD  losartan (COZAAR) 50 MG tablet Take 50 mg by mouth daily.  08/23/11   Larey Dresser, MD  Memantine HCl ER (NAMENDA XR) 28 MG CP24 Take 1 capsule by mouth daily.    Historical Provider, MD  nitroGLYCERIN (NITRODUR - DOSED IN MG/24 HR) 0.2 mg/hr patch Place 0.2 mg onto the skin daily.    Historical Provider, MD  pentoxifylline (TRENTAL) 400 MG CR tablet Take 400 mg by mouth 3 (three) times daily with meals.    Historical Provider, MD  potassium chloride (K-DUR) 10 MEQ tablet TAKE ONE TABLET BY MOUTH ONCE DAILY 01/06/14   Larey Dresser, MD  warfarin (COUMADIN) 5 MG tablet Take 1 tablet (5 mg total) by mouth as directed. Patient taking differently: Take 5-7.5 mg by mouth daily. 5mg  daily except for on Wednesday take 7.5mg  02/16/14   Larey Dresser, MD    Family History  Problem Relation Age of Onset  . Heart disease Father   . Other  Mother     pacemaker  . Cancer Sister     gallbladder    History   Social History  . Marital Status: Widowed    Spouse Name: N/A    Number of Children: 1  . Years of Education: N/A   Occupational History  . Retired    Social History Main Topics  . Smoking status: Never Smoker   . Smokeless tobacco: Never Used  . Alcohol Use: No  . Drug Use: No  . Sexual Activity: None   Other Topics Concern  . 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     Review of Systems: A 12 point ROS discussed and pertinent positives are indicated in the HPI above.  All other systems are negative.  Review of Systems  Constitutional: Positive for activity change. Negative for  fever, appetite change, fatigue and unexpected weight change.  HENT: Positive for hearing loss.   Respiratory: Negative for cough and shortness of breath.   Cardiovascular: Negative for chest pain.  Gastrointestinal: Negative for abdominal pain.  Genitourinary: Negative for difficulty urinating.  Musculoskeletal: Positive for gait problem.  Skin: Positive for color change.       Rt leg redness from shin down to foot Rt 1st toe- blackened tip-- bleeding areas Toenail changes noted  Neurological: Positive for weakness. Negative for dizziness.  Psychiatric/Behavioral: Positive for confusion. Negative for behavioral problems.    Vital Signs: BP 129/53 mmHg  Pulse 68  Temp(Src) 97.6 F (36.4 C) (Oral)  Resp 18  Ht 5\' 3"  (1.6 m)  Wt 56.246 kg (124 lb)  BMI 21.97 kg/m2  SpO2 20%  Physical Exam  Constitutional:  Thin/frail  Cardiovascular: Normal rate and regular rhythm.   Murmur heard. Pulmonary/Chest: Effort normal and breath sounds normal. She has no wheezes.  Abdominal: Soft. Bowel sounds are normal. There is no tenderness.  Musculoskeletal: Normal range of motion. She exhibits edema and tenderness.  Rt lower leg redness; swelling; skin changes; toenail changes  Neurological: She is alert.  Cannot tell me her DOB  Skin: Skin is warm and dry. There is erythema.  Psychiatric: She has a normal mood and affect. Her behavior is normal.  Consented daughter at bedside  Nursing note and vitals reviewed.   Imaging: No results found.  Labs:  CBC:  Recent Labs  04/26/14 0832  WBC 8.2  HGB 11.5*  HCT 35.2*  PLT 142*    COAGS:  Recent Labs  12/30/13 1609 02/10/14 0906 03/24/14 0914 04/26/14 0832  INR 2.3 2.0 1.9 2.02*  APTT  --   --   --  41*    BMP:  Recent Labs  12/15/13 1403 04/26/14 0832  NA 141 141  K 4.4 3.8  CL 105 104  CO2 31 26  GLUCOSE 122* 123*  BUN 29* 29*  CALCIUM 9.2 9.3  CREATININE 1.2 1.30*  GFRNONAA  --  35*  GFRAA  --  40*     LIVER FUNCTION TESTS: No results for input(s): BILITOT, AST, ALT, ALKPHOS, PROT, ALBUMIN in the last 8760 hours.  TUMOR MARKERS: No results for input(s): AFPTM, CEA, CA199, CHROMGRNA in the last 8760 hours.  Assessment and Plan:  Rt low leg and ankle pain; skin and toenail changes Abnormal doppler studies Now scheduled for B lower extremity arteriogram with possible angioplasty/stent placement Pt and dtr aware of procedure benefits and risks and agreeable to proceed Consent signed  andin chart  Thank you for this interesting consult.  I greatly enjoyed meeting SHIRELL STRUTHERS and look forward to participating in their care.    I spent a total of 20 minutes face to face in clinical consultation, greater than 50% of which was counseling/coordinating care for lower ext arteriogram with possible intervention  Signed: Myan Locatelli A 05/04/2014, 8:24 AM

## 2014-05-04 NOTE — Sedation Documentation (Signed)
Patient denies pain and is resting comfortably.  

## 2014-05-04 NOTE — ED Provider Notes (Signed)
CSN: [id number]     Arrival date & time 05/04/14  2102 History   First MD Initiated Contact with Patient 05/04/14 2105     Chief Complaint  Patient presents with  . Extremity Weakness  . Stroke Symptoms   Level V caveat: Dementia  (Consider location/radiation/quality/duration/timing/severity/associated sxs/prior Treatment) HPI Judith Zuniga is a 81 y.o. female with history of dementia, A. fib-paced comes in for evaluation of weakness. Granddaughter is in the room who gives the history of present illness. Granddaughter states the patient had IR angiogram of her right lower extremity done today by Dr. Laurence Ferrari. Patient was discharged at 3:00 PM this afternoon and appeared to have garbled speech. Granddaughter voiced this concern with Dr. Laurence Ferrari who advised she go home and be in a familiar environment. Granddaughter states they went home, but patient just got worse. States patient became agitated, yelling at her that there were children standing in the middle of the road, but there were not. She also reports that patient was accusing family members of breaking in her home and stealing things. Granddaughter reports patient has not had any of her medications today, including her Namenda. Reports she has only had 1 pain pill. Granddaughter denies pt has had SOB or cough, no fevers noticed.  Past Medical History  Diagnosis Date  . Atrioventricular block, complete s/p AV ablation   . Pacemaker -CRT- [employer]   . Type II or unspecified type diabetes mellitus without mention of complication, not stated as uncontrolled   . CAD (coronary artery disease)     s/p redo bypass surgery in 1998 with patent graft in Jan. 2009.   . Ischemic cardiomyopathy     ischemic heart myopathy , ejection fraction of 25%  . Systolic heart failure     class II and euvolemic  . Permanent atrial fibrillation   . Hyperlipidemia   . Diabetes mellitus   . Premature ventricular contractions     frequent  . Elevated  liver function tests     on amiodarone  . Hypertension   . Dementia    Past Surgical History  Procedure Laterality Date  . Coronary artery bypass graft  1998    CABG x5: RIMA to diagonal one, RIMA to diagonal 2, SVG to PD, SVG to PL, SVG to circ  . Coronary artery bypass graft  1985    x3: LIMA to LAD, SVG to PD and SVG to PL  . Cardiac caths      multile  . Pacer generator change out      [employer] Jude pacemaker   Family History  Problem Relation Age of Onset  . Heart disease Father   . Other Mother     pacemaker  . Cancer Sister     gallbladder   History  Substance Use Topics  . Smoking status: Never Smoker   . Smokeless tobacco: Never Used  . Alcohol Use: No   OB History    No data available     Review of Systems  Unable to perform ROS     Allergies  Review of patient's allergies indicates no known allergies.  Home Medications   Prior to Admission medications   Medication Sig Start Date End Date Taking? Authorizing Provider  atorvastatin (LIPITOR) 40 MG tablet Take 40 mg by mouth daily.  01/17/14  Yes Historical Provider, MD  bisoprolol (ZEBETA) 10 MG tablet Take 1 tablet (10 mg total) by mouth daily. 12/15/13  Yes Larey Dresser, MD  furosemide (LASIX)  40 MG tablet TAKE1/2 TABLET BY MOUTH EVERY DAY 02/16/14  Yes Larey Dresser, MD  glimepiride (AMARYL) 1 MG tablet Take 1 mg by mouth daily with breakfast.  06/12/13  Yes Historical Provider, MD  HYDROcodone-acetaminophen (NORCO/VICODIN) 5-325 MG per tablet Take 1 tablet by mouth every 6 (six) hours as needed for moderate pain.   Yes Historical Provider, MD  losartan (COZAAR) 50 MG tablet Take 50 mg by mouth daily.  08/23/11  Yes Larey Dresser, MD  Memantine HCl ER (NAMENDA XR) 28 MG CP24 Take 1 capsule by mouth daily.   Yes Historical Provider, MD  nitroGLYCERIN (NITRODUR - DOSED IN MG/24 HR) 0.2 mg/hr patch Place 0.2 mg onto the skin daily.   Yes Historical Provider, MD  pentoxifylline (TRENTAL) 400 MG CR tablet  Take 400 mg by mouth 3 (three) times daily with meals.   Yes Historical Provider, MD  potassium chloride (K-DUR) 10 MEQ tablet TAKE ONE TABLET BY MOUTH ONCE DAILY 01/06/14  Yes Larey Dresser, MD  warfarin (COUMADIN) 5 MG tablet Take 1 tablet (5 mg total) by mouth as directed. Patient taking differently: Take 5-7.5 mg by mouth daily. 5mg  daily except for on Wednesday take 7.5mg  02/16/14  Yes Larey Dresser, MD   BP 128/97 mmHg  Pulse 70  Temp(Src) 98.1 F (36.7 C) (Oral)  Resp 18  Ht 5\' 2"  (1.575 m)  Wt 125 lb (56.7 kg)  BMI 22.86 kg/m2  SpO2 95% Physical Exam  Constitutional: She appears well-developed and well-nourished.  Patient is somnolent, but is easily aroused by name.  HENT:  Head: Normocephalic and atraumatic.  Mouth/Throat: Oropharynx is clear and moist.  Eyes: Conjunctivae are normal. Right eye exhibits no discharge. Left eye exhibits no discharge. No scleral icterus.  Neck: Normal range of motion. Neck supple.  Cardiovascular: Normal rate, regular rhythm, normal heart sounds and intact distal pulses.   Systolic murmur 2/6  Pulmonary/Chest: Effort normal and breath sounds normal. No respiratory distress. She has no wheezes. She has no rales.  Abdominal: Soft. She exhibits no distension. There is no tenderness.  Musculoskeletal: She exhibits no tenderness.  Right ankle and foot are wrapped from previous procedure.  Neurological: She is alert.  Cranial Nerves II-XII grossly intact. Motor and sensation 5/5 in all 4 extremities. Oriented to person, DOB, place  Skin: Skin is warm and dry. No rash noted.  Psychiatric: She has a normal mood and affect.  Reaching out for books and other objects that are not there.  Nursing note and vitals reviewed.   ED Course  Procedures (including critical care time) Labs Review Labs Reviewed  COMPREHENSIVE METABOLIC PANEL - Abnormal; Notable for the following:    Glucose, Bld 149 (*)    Creatinine, Ser 1.21 (*)    Albumin 2.8 (*)     AST 60 (*)    Total Bilirubin 1.5 (*)    GFR calc non Af Amer 38 (*)    GFR calc Af Amer 44 (*)    All other components within normal limits  CBC WITH DIFFERENTIAL - Abnormal; Notable for the following:    RBC 3.09 (*)    Hemoglobin 9.5 (*)    HCT 28.8 (*)    Neutrophils Relative % 83 (*)    Lymphocytes Relative 9 (*)    All other components within normal limits  I-STAT CHEM 8, ED - Abnormal; Notable for the following:    Glucose, Bld 152 (*)    Hemoglobin 10.2 (*)  HCT 30.0 (*)    All other components within normal limits  URINALYSIS, ROUTINE W REFLEX MICROSCOPIC    Imaging Review Ct Head Wo Contrast  05/04/2014   CLINICAL DATA:  Right-sided facial droop and increased confusion.  EXAM: CT HEAD WITHOUT CONTRAST  TECHNIQUE: Contiguous axial images were obtained from the base of the skull through the vertex without intravenous contrast.  COMPARISON:  07/07/2011  FINDINGS: Ventricles are normal configuration. There is ventricular and sulcal enlargement reflecting age related volume loss. No hydrocephalus.  No parenchymal masses or mass effect.  There is no evidence of a recent cortical infarct.  Patchy white matter hypoattenuation noted consistent with mild chronic microvascular ischemic change, stable.  No extra-axial masses or abnormal fluid collections.  There is no intracranial hemorrhage.  Visualized sinuses and mastoid air cells are clear.  IMPRESSION: 1. No acute intracranial abnormalities. 2. Age related volume loss. Mild chronic microvascular ischemic change.   Electronically Signed   By: Lajean Manes M.D.   On: 05/04/2014 23:22     EKG Interpretation None     Meds given in ED:  Medications - No data to display  New Prescriptions   No medications on file   Filed Vitals:   05/04/14 2115 05/04/14 2116  BP: 128/97 128/97  Pulse:  70  Temp:  98.1 F (36.7 C)  TempSrc:  Oral  Resp:  18  Height:  5\' 2"  (1.575 m)  Weight:  125 lb (56.7 kg)  SpO2:  95%    MDM   Vitals stable - WNL -afebrile Pt resting comfortably in ED. PE--Benign Neuro exam, Pt exhibits symptoms of acute delirium. Reaching for objects in room that are not there. Labwork--noncontributory. Pending UA Imaging--CT head shows no acute intracranial pathology  Discussed pt presentation and ED course with attending, Dr. Wilson Singer. Due to pt living alone and her current state of mind, decision made to have pt admitted for acute delirium.  Consult hospitalist. Dr. Arnoldo Morale to see in ED. Pt admitted.  Final diagnoses:  Stroke       Verl Dicker, PA-C 05/05/14 [place], MD 05/06/14 1630

## 2014-05-04 NOTE — Procedures (Signed)
Interventional Radiology Procedure Note  Procedure:  1.) RLE runoff angiogram 2.) Attempted but unsuccssful recanalization of the AT 3.) Successful atherectomy and PTA of PT to 2.5 mm  Access: Left common femoral 73F, Mynx Grip used for closure Complications:  None Recommendations: - Bedrest x 4 hrs - ADAT - F/U in 2 weeks with ABIs and clinic visit  Signed,  Criselda Peaches, MD

## 2014-05-04 NOTE — Discharge Instructions (Signed)
Angiogram, Care After °Refer to this sheet in the next few weeks. These instructions provide you with information on caring for yourself after your procedure. Your health care provider may also give you more specific instructions. Your treatment has been planned according to current medical practices, but problems sometimes occur. Call your health care provider if you have any problems or questions after your procedure.  °WHAT TO EXPECT AFTER THE PROCEDURE °After your procedure, it is typical to have the following sensations: °· Minor discomfort or tenderness and a small bump at the catheter insertion site. The bump should usually decrease in size and tenderness within 1 to 2 weeks. °· Any bruising will usually fade within 2 to 4 weeks. °HOME CARE INSTRUCTIONS  °· You may need to keep taking blood thinners if they were prescribed for you. Take medicines only as directed by your health care provider. °· Do not apply powder or lotion to the site. °· Do not take baths, swim, or use a hot tub until your health care provider approves. °· You may shower 24 hours after the procedure. Remove the bandage (dressing) and gently wash the site with plain soap and water. Gently pat the site dry. °· Inspect the site at least twice daily. °· Limit your activity for the first 48 hours. Do not bend, squat, or lift anything over 20 lb (9 kg) or as directed by your health care provider. °· Plan to have someone take you home after the procedure. Follow instructions about when you can drive or return to work. °SEEK MEDICAL CARE IF: °· You get light-headed when standing up. °· You have drainage (other than a small amount of blood on the dressing). °· You have chills. °· You have a fever. °· You have redness, warmth, swelling, or pain at the insertion site. °SEEK IMMEDIATE MEDICAL CARE IF:  °· You develop chest pain or shortness of breath, feel faint, or pass out. °· You have bleeding, swelling larger than a walnut, or drainage from the  catheter insertion site. °· You develop pain, discoloration, coldness, or severe bruising in the leg or arm that held the catheter. °· You have heavy bleeding from the site. If this happens, hold pressure on the site and call 911. °MAKE SURE YOU: °· Understand these instructions. °· Will watch your condition. °· Will get help right away if you are not doing well or get worse. °Document Released: 10/18/2004 Document Revised: 08/16/2013 Document Reviewed: 08/24/2012 °[employer]® Patient Information ©2015 [employer]. This information is not intended to replace advice given to you by your health care provider. Make sure you discuss any questions you have with your health care provider. ° °

## 2014-05-04 NOTE — Sedation Documentation (Signed)
ACT 214

## 2014-05-04 NOTE — Progress Notes (Signed)
Virl Son, R.N. called for help from pt's room.  Upon entering pt's room, pt appeared to be confused and climbing out of bed.  Bright red blood on sheets and blanket.  Upon investigation, pts femoral site was unremarkable and she was noted to have pulled her IV out.  Pressure held and pt and bed cleaned up. Mia Staton,CNA sat with pt until her family member returned to room.  Incident reported to Dr. Laurence Ferrari who assessed pt.  Incident reported to family member who was advised that pt should not be left alone while she is so confused even when pt goes home.

## 2014-05-04 NOTE — Sedation Documentation (Signed)
Patient denies pain.

## 2014-05-04 NOTE — Sedation Documentation (Signed)
Small hematoma noted the the Left groin area. MD aware of such and will be monitored.

## 2014-05-04 NOTE — ED Notes (Signed)
Pt arrives via EMS from home. Pt had surgical procedure on LE today and was under anesthesia. Daughter states that she last saw her normal at 0730. Pts daughter then picked her up at 1600 and states that her mother had rt sided facial droop and increased confusion. States that she talked to the MD regarding her concerns and was told it was from anesthesia and pain medicine. Daughter states that she is still concerned. Daughter states that her baseline is confused and repetitive asking of the same questions. States that it was significantly worse today.  States pt takes coumadin and has been off of it for 5 days for surgery.

## 2014-05-05 ENCOUNTER — Observation Stay (HOSPITAL_COMMUNITY): Payer: Medicare Other

## 2014-05-05 ENCOUNTER — Encounter (HOSPITAL_COMMUNITY): Payer: Self-pay | Admitting: Radiology

## 2014-05-05 DIAGNOSIS — E1159 Type 2 diabetes mellitus with other circulatory complications: Secondary | ICD-10-CM | POA: Diagnosis not present

## 2014-05-05 DIAGNOSIS — L98499 Non-pressure chronic ulcer of skin of other sites with unspecified severity: Secondary | ICD-10-CM

## 2014-05-05 DIAGNOSIS — R410 Disorientation, unspecified: Secondary | ICD-10-CM | POA: Diagnosis present

## 2014-05-05 DIAGNOSIS — I639 Cerebral infarction, unspecified: Secondary | ICD-10-CM

## 2014-05-05 DIAGNOSIS — I5022 Chronic systolic (congestive) heart failure: Secondary | ICD-10-CM | POA: Diagnosis not present

## 2014-05-05 DIAGNOSIS — I6789 Other cerebrovascular disease: Secondary | ICD-10-CM

## 2014-05-05 DIAGNOSIS — I739 Peripheral vascular disease, unspecified: Secondary | ICD-10-CM

## 2014-05-05 DIAGNOSIS — I110 Hypertensive heart disease with heart failure: Secondary | ICD-10-CM | POA: Diagnosis not present

## 2014-05-05 DIAGNOSIS — Z950 Presence of cardiac pacemaker: Secondary | ICD-10-CM

## 2014-05-05 DIAGNOSIS — E785 Hyperlipidemia, unspecified: Secondary | ICD-10-CM | POA: Diagnosis not present

## 2014-05-05 DIAGNOSIS — I6340 Cerebral infarction due to embolism of unspecified cerebral artery: Secondary | ICD-10-CM

## 2014-05-05 DIAGNOSIS — I482 Chronic atrial fibrillation, unspecified: Secondary | ICD-10-CM | POA: Diagnosis present

## 2014-05-05 DIAGNOSIS — Z7901 Long term (current) use of anticoagulants: Secondary | ICD-10-CM

## 2014-05-05 DIAGNOSIS — Z8673 Personal history of transient ischemic attack (TIA), and cerebral infarction without residual deficits: Secondary | ICD-10-CM | POA: Insufficient documentation

## 2014-05-05 DIAGNOSIS — E1151 Type 2 diabetes mellitus with diabetic peripheral angiopathy without gangrene: Secondary | ICD-10-CM | POA: Insufficient documentation

## 2014-05-05 DIAGNOSIS — I5020 Unspecified systolic (congestive) heart failure: Secondary | ICD-10-CM | POA: Diagnosis present

## 2014-05-05 DIAGNOSIS — I255 Ischemic cardiomyopathy: Secondary | ICD-10-CM

## 2014-05-05 DIAGNOSIS — I509 Heart failure, unspecified: Secondary | ICD-10-CM

## 2014-05-05 DIAGNOSIS — E1165 Type 2 diabetes mellitus with hyperglycemia: Secondary | ICD-10-CM

## 2014-05-05 DIAGNOSIS — IMO0002 Reserved for concepts with insufficient information to code with codable children: Secondary | ICD-10-CM | POA: Insufficient documentation

## 2014-05-05 DIAGNOSIS — E119 Type 2 diabetes mellitus without complications: Secondary | ICD-10-CM | POA: Diagnosis present

## 2014-05-05 LAB — URINALYSIS, ROUTINE W REFLEX MICROSCOPIC
BILIRUBIN URINE: NEGATIVE
Glucose, UA: NEGATIVE mg/dL
Ketones, ur: NEGATIVE mg/dL
Leukocytes, UA: NEGATIVE
Nitrite: NEGATIVE
PH: 5 (ref 5.0–8.0)
PROTEIN: NEGATIVE mg/dL
Specific Gravity, Urine: 1.01 (ref 1.005–1.030)
UROBILINOGEN UA: 0.2 mg/dL (ref 0.0–1.0)

## 2014-05-05 LAB — LIPID PANEL
Cholesterol: 108 mg/dL (ref 0–200)
HDL: 32 mg/dL — ABNORMAL LOW (ref >39)
LDL Cholesterol: 63 mg/dL (ref 0–99)
Total CHOL/HDL Ratio: 3.4 RATIO
Triglycerides: 63 mg/dL (ref <150)
VLDL: 13 mg/dL (ref 0–40)

## 2014-05-05 LAB — GLUCOSE, CAPILLARY
GLUCOSE-CAPILLARY: 110 mg/dL — AB (ref 70–99)
Glucose-Capillary: 105 mg/dL — ABNORMAL HIGH (ref 70–99)
Glucose-Capillary: 101 mg/dL — ABNORMAL HIGH (ref 70–99)
Glucose-Capillary: 79 mg/dL (ref 70–99)
Glucose-Capillary: 79 mg/dL (ref 70–99)

## 2014-05-05 LAB — BLOOD GAS, ARTERIAL
ACID-BASE EXCESS: 0.5 mmol/L (ref 0.0–2.0)
BICARBONATE: 24.4 meq/L — AB (ref 20.0–24.0)
Drawn by: 313941
FIO2: 0.21 %
O2 Saturation: 92.7 %
Patient temperature: 98.6
TCO2: 25.6 mmol/L (ref 0–100)
pCO2 arterial: 38.4 mmHg (ref 35.0–45.0)
pH, Arterial: 7.419 (ref 7.350–7.450)
pO2, Arterial: 66.8 mmHg — ABNORMAL LOW (ref 80.0–100.0)

## 2014-05-05 LAB — URINE MICROSCOPIC-ADD ON

## 2014-05-05 LAB — PROTIME-INR
INR: 1.54 — AB (ref 0.00–1.49)
PROTHROMBIN TIME: 18.7 s — AB (ref 11.6–15.2)

## 2014-05-05 LAB — HEMOGLOBIN A1C
Hgb A1c MFr Bld: 6.3 % — ABNORMAL HIGH (ref <5.7)
Mean Plasma Glucose: 134 mg/dL — ABNORMAL HIGH (ref <117)

## 2014-05-05 MED ORDER — CHLORHEXIDINE GLUCONATE 0.12 % MT SOLN
15.0000 mL | Freq: Two times a day (BID) | OROMUCOSAL | Status: DC
  Administered 2014-05-05 – 2014-05-14 (×15): 15 mL via OROMUCOSAL
  Filled 2014-05-05 (×11): qty 15

## 2014-05-05 MED ORDER — BISOPROLOL FUMARATE 10 MG PO TABS
10.0000 mg | ORAL_TABLET | Freq: Every day | ORAL | Status: DC
Start: 2014-05-05 — End: 2014-05-15
  Administered 2014-05-06 – 2014-05-15 (×9): 10 mg via ORAL
  Filled 2014-05-05 (×12): qty 1

## 2014-05-05 MED ORDER — SODIUM CHLORIDE 0.9 % IV SOLN
INTRAVENOUS | Status: DC
  Administered 2014-05-05 – 2014-05-13 (×5): via INTRAVENOUS

## 2014-05-05 MED ORDER — WARFARIN SODIUM 5 MG PO TABS: 5.0000 mg | ORAL_TABLET | Freq: Every day | ORAL | Status: DC

## 2014-05-05 MED ORDER — CETYLPYRIDINIUM CHLORIDE 0.05 % MT LIQD
7.0000 mL | Freq: Two times a day (BID) | OROMUCOSAL | Status: DC
  Administered 2014-05-05 – 2014-05-14 (×16): 7 mL via OROMUCOSAL

## 2014-05-05 MED ORDER — POTASSIUM CHLORIDE ER 10 MEQ PO TBCR
10.0000 meq | EXTENDED_RELEASE_TABLET | Freq: Every day | ORAL | Status: DC
  Administered 2014-05-06 – 2014-05-15 (×9): 10 meq via ORAL
  Filled 2014-05-05 (×20): qty 1

## 2014-05-05 MED ORDER — SENNOSIDES-DOCUSATE SODIUM 8.6-50 MG PO TABS: 1.0000 | ORAL_TABLET | Freq: Every evening | ORAL | Status: DC | PRN

## 2014-05-05 MED ORDER — MEMANTINE HCL ER 28 MG PO CP24
28.0000 mg | ORAL_CAPSULE | Freq: Every day | ORAL | Status: DC
  Administered 2014-05-06 – 2014-05-15 (×9): 28 mg via ORAL
  Filled 2014-05-05 (×11): qty 1

## 2014-05-05 MED ORDER — ASPIRIN 325 MG PO TABS
325.0000 mg | ORAL_TABLET | Freq: Every day | ORAL | Status: DC
  Administered 2014-05-06: 325 mg via ORAL
  Filled 2014-05-05: qty 1

## 2014-05-05 MED ORDER — LOSARTAN POTASSIUM 50 MG PO TABS
50.0000 mg | ORAL_TABLET | Freq: Every day | ORAL | Status: DC
  Administered 2014-05-06 – 2014-05-15 (×8): 50 mg via ORAL
  Filled 2014-05-05 (×8): qty 1

## 2014-05-05 MED ORDER — WARFARIN - PHARMACIST DOSING INPATIENT: Freq: Every day | Status: DC

## 2014-05-05 MED ORDER — INSULIN ASPART 100 UNIT/ML ~~LOC~~ SOLN
0.0000 [IU] | SUBCUTANEOUS | Status: DC
  Administered 2014-05-06: 1 [IU] via SUBCUTANEOUS
  Administered 2014-05-06: 3 [IU] via SUBCUTANEOUS
  Administered 2014-05-07 (×2): 2 [IU] via SUBCUTANEOUS
  Administered 2014-05-07 (×2): 1 [IU] via SUBCUTANEOUS

## 2014-05-05 MED ORDER — ASPIRIN 300 MG RE SUPP
300.0000 mg | Freq: Every day | RECTAL | Status: DC
  Administered 2014-05-05: 300 mg via RECTAL
  Filled 2014-05-05: qty 1

## 2014-05-05 MED ORDER — WARFARIN SODIUM 5 MG PO TABS: 5.0000 mg | ORAL_TABLET | ORAL | Status: DC

## 2014-05-05 MED ORDER — WARFARIN SODIUM 7.5 MG PO TABS: 7.5000 mg | ORAL_TABLET | ORAL | Status: DC

## 2014-05-05 MED ORDER — WARFARIN - PHYSICIAN DOSING INPATIENT: Freq: Every day | Status: DC

## 2014-05-05 MED ORDER — HEPARIN (PORCINE) IN NACL 100-0.45 UNIT/ML-% IJ SOLN
900.0000 [IU]/h | INTRAMUSCULAR | Status: DC
  Administered 2014-05-05: 750 [IU]/h via INTRAVENOUS
  Filled 2014-05-05: qty 250

## 2014-05-05 MED ORDER — FUROSEMIDE 20 MG PO TABS: 20.0000 mg | ORAL_TABLET | Freq: Every day | ORAL | Status: DC

## 2014-05-05 MED ORDER — STROKE: EARLY STAGES OF RECOVERY BOOK
Freq: Once | Status: AC
  Administered 2014-05-05: 04:00:00

## 2014-05-05 MED ORDER — LORAZEPAM 2 MG/ML IJ SOLN
1.0000 mg | Freq: Once | INTRAMUSCULAR | Status: AC
  Administered 2014-05-05: 1 mg via INTRAVENOUS
  Filled 2014-05-05: qty 1

## 2014-05-05 MED ORDER — ATORVASTATIN CALCIUM 40 MG PO TABS
40.0000 mg | ORAL_TABLET | Freq: Every day | ORAL | Status: DC
  Administered 2014-05-06 – 2014-05-15 (×9): 40 mg via ORAL
  Filled 2014-05-05 (×9): qty 1

## 2014-05-05 MED ORDER — WARFARIN SODIUM 7.5 MG PO TABS: 7.5000 mg | ORAL_TABLET | Freq: Once | ORAL | Status: DC

## 2014-05-05 NOTE — Progress Notes (Signed)
OT Cancellation Note  Patient Details Name: Judith Zuniga MRN: [id number] DOB: [birth year]   Cancelled Treatment:    Reason Eval/Treat Not Completed: Patient's level of consciousness. According to RN, patient recently had medication and unable to participate in therapies at this time. Will re-attempt as able.   Solenne Manwarren 05/05/2014, 1:53 PM

## 2014-05-05 NOTE — Progress Notes (Signed)
Patient Demographics  Judith Zuniga, is a 81 y.o. female, DOB - Jun 02, 1920, OZD:[id number]  Admit date - 05/04/2014   Admitting Physician Theressa Millard, MD  Outpatient Primary MD for the patient is Jani Gravel, MD  LOS - 1   Chief Complaint  Patient presents with  . Extremity Weakness  . Stroke Symptoms       Admission history of present illness: Judith Zuniga is a 81 y.o. female with a history of CAD/Ischemic Cardiomyopathy, Complete AV Block S/P St Jude Biventricular Pacemaker, Chronic Atrial Fibrillation on Coumadin Rx, Systolic CHF, DM2, HTN, PAD, and Dementia who was brought to the ED via EMS due to symptoms of Increased Confusion, Slurring of her speech and a right facial droop noticed by her daughter at 3 pm during the day following her having an Angiogram and angioplasty of the Rt Lower Extremity by Interventional Radiology in short stay for an ischemic right foot. The history is per the daughter who reports that her mother's words were not making sense, and her speech was slurred. At baseline she has mild dementia and is on Namenda Rx but lives alone and is able to perform all of her ADLs  Subjective:   Judith Zuniga had an episode of delirium and agitation overnight, where she given IV Ativan . Assessment & Plan    Principal Problem:   CVA (cerebral infarction) Active Problems:   Hyperlipidemia   Benign hypertensive heart disease with heart failure   CAD, ARTERY BYPASS GRAFT   PACEMAKER, CRT-St. Jude   Long term current use of anticoagulant   Diastolic CHF, chronic   Atherosclerosis of native arteries of the extremities with ulceration   Delirium   Ischemic cardiomyopathy   Chronic atrial fibrillation   Diabetes mellitus   Systolic heart failure   Chronic systolic heart failure   DM (diabetes mellitus) type II uncontrolled, periph  vascular disorder  Acute encephalopathy, facial droop, slurred speech - Unclear if this is related to acute CVA or not, given unable to do MRI secondary to pacemaker. - We'll proceed with CVA workup including 2-D echo, carotid Dopplers, hemoglobin A1c, lipid panel, - Nothing by mouth till pass his swallow screen - Continue with telemetry   CVA (cerebral infarction)- Rule Out. Ct Head Negative CVA Protocol Neuro checks Telemetry ASA Rx Neuro Consult    Delirium- CVA Workup Monitor     Ischemic cardiomyopathy/CAD, ARTERY BYPASS GRAFT/Systolic CHF On Lasix, Bisoprolol, Losartan, and Atrovastatin   Chronic atrial fibrillation On coumadin Rx which has been on hold since 01/18 for procedure on 01/20 On Bisoprolol    Diastolic CHF, chronic On Lasix, Bisoprolol, and Losartan    Hyperlipidemia On Atorvastatin Rx   Benign hypertensive heart disease with heart failure On Bisoprolol, Losartan, and Lasix Rx   PACEMAKER, CRT-St. Jude- due to Complete Heart Block On Coumadin Rx    Long term current use of anticoagulant Resume coumadin Rx Pharmacy adjustment of Coumadin   Atherosclerosis of native arteries of the extremities with ulceration Vascular Procedure today by Interventional Radiology Dr. Laurence Ferrari Angiogram, and Angioplasty done    DM2- Hold Amaryl SSI coverage PRN Check HbA1C in AM     DVT  Prophylaxis On Coumadin Rx    Code Status:Full  Family Communication: Daughter at bedside  Disposition Plan: Remains inpatient   Procedures  None   Consults  Neurology   Medications  Scheduled Meds: . aspirin  300 mg Rectal Daily   Or  . aspirin  325 mg Oral Daily  . atorvastatin  40 mg Oral Daily  . bisoprolol  10 mg Oral Daily  . furosemide  20 mg Oral Daily  . insulin aspart  0-9 Units Subcutaneous 6 times per day  . losartan  50 mg Oral Daily  . memantine  28 mg Oral Daily  . potassium chloride  10 mEq Oral Daily  . warfarin  7.5 mg Oral ONCE-1800  . Warfarin - Pharmacist Dosing Inpatient   Does not apply q1800   Continuous Infusions:  PRN Meds:.senna-docusate  DVT Prophylaxis  on warfarin  Lab Results  Component Value Date   PLT 159 05/04/2014    Antibiotics   Anti-infectives    None          Objective:   Filed Vitals:   05/05/14 1200 05/05/14 1335 05/05/14 1400 05/05/14 1600  BP: 127/48  107/45 136/49  Pulse: 69  70 82  Temp: 98.9 F (37.2 C) 98.8 F (37.1 C) 98 F (36.7 C) 98.2 F (36.8 C)  TempSrc: Axillary Axillary Axillary Axillary  Resp: 16  17 18   Height:      Weight:      SpO2: 94%  96% 92%    Wt Readings from Last 3 Encounters:  05/05/14 55.1 kg (121 lb 7.6 oz)  04/26/14 56.7 kg (125 lb)  12/15/13 56.7 kg (125 lb)    No intake or output data in the 24 hours ending 05/05/14 1618   Physical Exam  Patient in deep sleep this a.m. after receiving IV Ativan, hard to arouse. [place].AT, Supple Neck,No JVD, No cervical lymphadenopathy appriciated.  Symmetrical Chest wall movement, Good air movement bilaterally, CTAB RRR,No Gallops,Rubs or new Murmurs, No Parasternal Heave +ve B.Sounds, Abd Soft, No tenderness, No organomegaly appriciated, No rebound - guarding or rigidity. No Cyanosis, Clubbing or edema, No new Rash or bruise  right leg dressing.   Data Review   Micro Results No results found for  this or any previous visit (from the past 240 hour(s)).  Radiology Reports Ct Head Wo Contrast  05/05/2014   CLINICAL DATA:  Subsequent evaluation of stroke 24 hr after administration of TPA  EXAM: CT HEAD WITHOUT CONTRAST  TECHNIQUE: Contiguous axial images were obtained from the base of the skull through the vertex without intravenous contrast.  COMPARISON:  05/04/2014  FINDINGS: There is no evidence of hemorrhage or extra-axial fluid. Again identified is moderate diffuse atrophy and moderate low attenuation diffusely in the deep white matter consistent with chronic small vessel ischemic change. No evidence of vascular territory infarct. No hydrocephalus or mass.  IMPRESSION: No significant change from yesterday's examination. No hemorrhage or extra-axial fluid.   Electronically Signed   By: Skipper Cliche M.D.   On: 05/05/2014 10:18   Ct Head Wo Contrast  05/04/2014   CLINICAL DATA:  Right-sided facial droop and increased confusion.  EXAM: CT HEAD WITHOUT CONTRAST  TECHNIQUE: Contiguous axial images were obtained from the base of the skull through the vertex without intravenous contrast.  COMPARISON:  07/07/2011  FINDINGS: Ventricles are normal configuration. There is ventricular and sulcal enlargement reflecting age related volume loss. No hydrocephalus.  No parenchymal masses or mass effect.  There is no evidence of a recent cortical infarct.  Patchy white matter  hypoattenuation noted consistent with mild chronic microvascular ischemic change, stable.  No extra-axial masses or abnormal fluid collections.  There is no intracranial hemorrhage.  Visualized sinuses and mastoid air cells are clear.  IMPRESSION: 1. No acute intracranial abnormalities. 2. Age related volume loss. Mild chronic microvascular ischemic change.   Electronically Signed   By: Lajean Manes M.D.   On: 05/04/2014 23:22    CBC  Recent Labs Lab 05/04/14 0800 05/04/14 2127 05/04/14 2156  WBC 8.9  --  8.4  HGB 10.4* 10.2* 9.5*   HCT 32.0* 30.0* 28.8*  PLT 188  --  159  MCV 92.8  --  93.2  MCH 30.1  --  30.7  MCHC 32.5  --  33.0  RDW 13.8  --  13.9  LYMPHSABS 0.8  --  0.7  MONOABS 0.6  --  0.6  EOSABS 0.0  --  0.0  BASOSABS 0.0  --  0.0    Chemistries   Recent Labs Lab 05/04/14 0800 05/04/14 2127 05/04/14 2156  NA 140 142 142  K 4.0 4.0 4.6  CL 101 107 107  CO2 29  --  27  GLUCOSE 180* 152* 149*  BUN 21 23 21   CREATININE 1.15* 1.10 1.21*  CALCIUM 9.0  --  8.7  AST  --   --  60*  ALT  --   --  23  ALKPHOS  --   --  69  BILITOT  --   --  1.5*   ------------------------------------------------------------------------------------------------------------------ estimated creatinine clearance is 24 mL/min (by C-G formula based on Cr of 1.21). ------------------------------------------------------------------------------------------------------------------  Recent Labs  05/05/14 0514  HGBA1C 6.3*   ------------------------------------------------------------------------------------------------------------------  Recent Labs  05/05/14 0514  CHOL 108  HDL 32*  LDLCALC 63  TRIG 63  CHOLHDL 3.4   ------------------------------------------------------------------------------------------------------------------ No results for input(s): TSH, T4TOTAL, T3FREE, THYROIDAB in the last 72 hours.  Invalid input(s): FREET3 ------------------------------------------------------------------------------------------------------------------ No results for input(s): VITAMINB12, FOLATE, FERRITIN, TIBC, IRON, RETICCTPCT in the last 72 hours.  Coagulation profile  Recent Labs Lab 05/04/14 0800 05/05/14 0514  INR 1.65* 1.54*    No results for input(s): DDIMER in the last 72 hours.  Cardiac Enzymes No results for input(s): CKMB, TROPONINI, MYOGLOBIN in the last 168 hours.  Invalid input(s):  CK ------------------------------------------------------------------------------------------------------------------ Invalid input(s): POCBNP     Time Spent in minutes   25 minutes    Reese Senk M.D on 05/05/2014 at 4:18 PM  Between 7am to 7pm - Pager - [phone]  After 7pm go to [email or web address] - password [email or web address]  And look for the night coverage person covering for me after hours  [facility] Office  [phone]   **Disclaimer: This note may have been dictated with voice recognition software. Similar sounding words can inadvertently be transcribed and this note may contain transcription errors which may not have been corrected upon publication of note.**

## 2014-05-05 NOTE — Consult Note (Signed)
Referring Physician: Elegerhawy    Chief Complaint: Stroke  HPI:                                                                                                                                         Judith Zuniga is an 81 y.o. female with known dementia, on chronic coumadin (this was held for five days recently for procedure on 05/04/2014--recent INR on 1/20 1.54)). Patient underwent a RLE angiogram and Athrectomy for Lower limb ischemia on 1/20.  After the procedure daughter noted patient was very sleepy and possible right lower facial droop.  She went home with her daughter. After sedation had worn off, it was noted she not only had right lower facial droop but also both expressive and receptive aphasia. Daughter states she was moving her limbs with full strength and showed no unilateral weakness. Pateint was brought back to hospital for due to these symptoms.   Currently patient is very drowsy--she will withdrawal from tactile stimuli, push me away but holds eye clenched tight and follows no instructions.  She had received 1 mg Ativan at 0429 and daughter states she has remained drowsy since then.   Date last known well: Date: 05/04/2014 Time last known well: Time: 08:00 tPA Given: No: out of window  Past Medical History  Diagnosis Date  . Atrioventricular block, complete s/p AV ablation   . Pacemaker -CRT- St Judes   . Type II or unspecified type diabetes mellitus without mention of complication, not stated as uncontrolled   . CAD (coronary artery disease)     s/p redo bypass surgery in 1998 with patent graft in Jan. 2009.   . Ischemic cardiomyopathy     ischemic heart myopathy , ejection fraction of 25%  . Systolic heart failure     class II and euvolemic  . Permanent atrial fibrillation   . Hyperlipidemia   . Diabetes mellitus   . Premature ventricular contractions     frequent  . Elevated liver function tests     on amiodarone  . Hypertension   . Dementia     Past Surgical  History  Procedure Laterality Date  . Coronary artery bypass graft  1998    CABG x5: RIMA to diagonal one, RIMA to diagonal 2, SVG to PD, SVG to PL, SVG to circ  . Coronary artery bypass graft  1985    x3: LIMA to LAD, SVG to PD and SVG to PL  . Cardiac caths      multile  . Pacer generator change out      St. Jude pacemaker    Family History  Problem Relation Age of Onset  . Heart disease Father   . Other Mother     pacemaker  . Cancer Sister     gallbladder   Social History:  reports that she has never smoked. She has never used smokeless tobacco.  She reports that she does not drink alcohol or use illicit drugs.  Allergies: No Known Allergies  Medications:                                                                                                                           Current Facility-Administered Medications  Medication Dose Route Frequency Provider Last Rate Last Dose  . aspirin suppository 300 mg  300 mg Rectal Daily Theressa Millard, MD   300 mg at 05/05/14 1036   Or  . aspirin tablet 325 mg  325 mg Oral Daily Harvette Evonnie Dawes, MD      . atorvastatin (LIPITOR) tablet 40 mg  40 mg Oral Daily Theressa Millard, MD   40 mg at 05/05/14 0931  . bisoprolol (ZEBETA) tablet 10 mg  10 mg Oral Daily Theressa Millard, MD   10 mg at 05/05/14 0931  . furosemide (LASIX) tablet 20 mg  20 mg Oral Daily Theressa Millard, MD   20 mg at 05/05/14 0932  . insulin aspart (novoLOG) injection 0-9 Units  0-9 Units Subcutaneous 6 times per day Theressa Millard, MD   0 Units at 05/05/14 0200  . losartan (COZAAR) tablet 50 mg  50 mg Oral Daily Theressa Millard, MD   50 mg at 05/05/14 0932  . memantine (NAMENDA XR) 24 hr capsule 28 mg  28 mg Oral Daily Theressa Millard, MD   28 mg at 05/05/14 0932  . potassium chloride (K-DUR) CR tablet 10 mEq  10 mEq Oral Daily Theressa Millard, MD   10 mEq at 05/05/14 0932  . senna-docusate (Senokot-S) tablet 1 tablet  1 tablet Oral QHS PRN  Theressa Millard, MD      . warfarin (COUMADIN) tablet 7.5 mg  7.5 mg Oral ONCE-1800 Rogue Bussing, [facility]      . Warfarin - Pharmacist Dosing Inpatient   Does not apply q1800 Rogue Bussing, RPH         ROS:                                                                                                                                       History obtained from unobtainable from patient due to mental status   Neurologic Examination:  Blood pressure 118/53, pulse 70, temperature 98.2 F (36.8 C), temperature source Axillary, resp. rate 16, height 5\' 2"  (1.575 m), weight 55.1 kg (121 lb 7.6 oz), SpO2 95 %.  HEENT-  Normocephalic, no lesions, without obvious abnormality.  Normal external eye and conjunctiva.  Normal TM's bilaterally.  Normal auditory canals and external ears. Normal external nose, mucus membranes and septum.  Normal pharynx. Cardiovascular-  pulses palpable throughout   Lungs- no tachypnea, retractions or cyanosis Abdomen- normal findings: bowel sounds normal Extremities- no edema Lymph-no adenopathy palpable Musculoskeletal-no muscular tenderness noted Skin-warm and dry, no hyperpigmentation, vitiligo, or suspicious lesions  Neurological Examination Mental Status: Significantly drowsy, follows no verbal or visual commands, becomes agitated when attempts to interact with patient are made.  Cranial Nerves: II: Discs unable to visualize due to patient holding eyelids clenched closed. No blink to threat when eyelids held open for brief period of time, pupils 44mm equal, round, reactive to light and accommodation III,IV, VI: oculocephalic reflex intact V,VII: right facial droop, facial light touch sensation intact VIII: hearing normal bilaterally IX,X: gag reflex present XI: bilateral shoulder shrug XII: unable to assess Motor: Moving all extremities antigravity  and withdrawing from noxious stimuli Sensory: withdraws from stimuli in all extremities.  Deep Tendon Reflexes: 1+ and symmetric throughout UE and KJ no AJ Plantars: Right: bandaged   Left: downgoing Cerebellar: Unable to assess due to sedation Gait: unable to assess due to sedation.    Lab Results: Basic Metabolic Panel:  Recent Labs Lab 05/04/14 0800 05/04/14 2127 05/04/14 2156  NA 140 142 142  K 4.0 4.0 4.6  CL 101 107 107  CO2 29  --  27  GLUCOSE 180* 152* 149*  BUN 21 23 21   CREATININE 1.15* 1.10 1.21*  CALCIUM 9.0  --  8.7    Liver Function Tests:  Recent Labs Lab 05/04/14 2156  AST 60*  ALT 23  ALKPHOS 69  BILITOT 1.5*  PROT 6.4  ALBUMIN 2.8*   No results for input(s): LIPASE, AMYLASE in the last 168 hours. No results for input(s): AMMONIA in the last 168 hours.  CBC:  Recent Labs Lab 05/04/14 0800 05/04/14 2127 05/04/14 2156  WBC 8.9  --  8.4  NEUTROABS 7.5  --  7.1  HGB 10.4* 10.2* 9.5*  HCT 32.0* 30.0* 28.8*  MCV 92.8  --  93.2  PLT 188  --  159    Cardiac Enzymes: No results for input(s): CKTOTAL, CKMB, CKMBINDEX, TROPONINI in the last 168 hours.  Lipid Panel:  Recent Labs Lab 05/05/14 0514  CHOL 108  TRIG 63  HDL 32*  CHOLHDL 3.4  VLDL 13  LDLCALC 63    CBG:  Recent Labs Lab 05/04/14 0748 05/04/14 1724 05/05/14 0322 05/05/14 1034  GLUCAP 156* 88 110* 105*    Microbiology: No results found for this or any previous visit.  Coagulation Studies:  Recent Labs  05/04/14 0800 05/05/14 0514  LABPROT 19.7* 18.7*  INR 1.65* 1.54*    Imaging: Ct Head Wo Contrast  05/05/2014   CLINICAL DATA:  Subsequent evaluation of stroke 24 hr after administration of TPA  EXAM: CT HEAD WITHOUT CONTRAST  TECHNIQUE: Contiguous axial images were obtained from the base of the skull through the vertex without intravenous contrast.  COMPARISON:  05/04/2014  FINDINGS: There is no evidence of hemorrhage or extra-axial fluid. Again  identified is moderate diffuse atrophy and moderate low attenuation diffusely in the deep white matter consistent with chronic small vessel ischemic change.  No evidence of vascular territory infarct. No hydrocephalus or mass.  IMPRESSION: No significant change from yesterday's examination. No hemorrhage or extra-axial fluid.   Electronically Signed   By: Skipper Cliche M.D.   On: 05/05/2014 10:18   Ct Head Wo Contrast  05/04/2014   CLINICAL DATA:  Right-sided facial droop and increased confusion.  EXAM: CT HEAD WITHOUT CONTRAST  TECHNIQUE: Contiguous axial images were obtained from the base of the skull through the vertex without intravenous contrast.  COMPARISON:  07/07/2011  FINDINGS: Ventricles are normal configuration. There is ventricular and sulcal enlargement reflecting age related volume loss. No hydrocephalus.  No parenchymal masses or mass effect.  There is no evidence of a recent cortical infarct.  Patchy white matter hypoattenuation noted consistent with mild chronic microvascular ischemic change, stable.  No extra-axial masses or abnormal fluid collections.  There is no intracranial hemorrhage.  Visualized sinuses and mastoid air cells are clear.  IMPRESSION: 1. No acute intracranial abnormalities. 2. Age related volume loss. Mild chronic microvascular ischemic change.   Electronically Signed   By: Lajean Manes M.D.   On: 05/04/2014 23:22    Etta Quill PA-C [facility] [phone]  05/05/2014, 11:23 AM   Patient seen and examined.  Clinical course and management discussed.  Necessary edits performed.  I agree with the above.  Assessment and plan of care developed and discussed below.    Assessment: 81 y.o. female admitted with slurred speech and facial droop.  Currently difficult to evaluate due to sedation.  Initial head CT personally reviewed and is only significant for small vessel disease.  Repeat head CT this morning personally reviewed as well and is suggestive of a  larger area of hypodensity in the left temporal lobe but is otherwise unchanged.  Lethargy likely secondary to medications.  Patient unable to go back on anticoagulation secondary to mental status.    Stroke Risk Factors - atrial fibrillation, hyperlipidemia and hypertension  Recommendations: 1. Continue rectal ASA until able to take po. 2. HgbA1c, fasting lipid panel 3. PT consult, OT consult, Speech consult 4. Echocardiogram 5. Carotid dopplers 6. NPO until RN stroke swallow screen 7. Telemetry monitoring 8. Frequent neuro checks    Alexis Goodell, MD [facility] [phone]  05/05/2014  1:03 PM

## 2014-05-05 NOTE — Progress Notes (Signed)
Patient makes only slight movement to painful stimuli, see assessment flowsheet. VSS, see vitals flowsheet. MD paged. New orders placed. Will continue to monitor closely.

## 2014-05-05 NOTE — Progress Notes (Signed)
*  PRELIMINARY RESULTS* Vascular Ultrasound Carotid Duplex (Doppler) has been completed.   Findings suggest 40-59% right internal carotid artery stenosis by peak systolic velocity, 05-69% by ICA/CCA ratio, and 7-94% by end diastolic velocities. The left internal carotid artery exhbits 40-59% stenosis by peak systolic velocity, and 8-01% stenosis by end diastolic velocity and ICA/CCA ratio. Vertebral arteries are patent with antegrade flow.  05/05/2014 1:27 PM Maudry Mayhew, RVT, RDCS, RDMS

## 2014-05-05 NOTE — Progress Notes (Signed)
Patient's condition unchanged from morning assessment. MD notified. No new orders.

## 2014-05-05 NOTE — Progress Notes (Signed)
UR completed 

## 2014-05-05 NOTE — Progress Notes (Signed)
  Echocardiogram 2D Echocardiogram has been performed.  Judith Zuniga 05/05/2014, 9:49 AM

## 2014-05-05 NOTE — Progress Notes (Signed)
Pt arrived to floor from ED. VSS. Pt very confused and hallucinating about objects and people in hospital room. Daughter at bedside. Placed on tele, V-paced. Bed alarm on. Will continue to monitor.

## 2014-05-05 NOTE — Therapy (Signed)
SLP Cancellation Note  Patient Details Name: ATHZIRY MILLICAN MRN: [id number] DOB: 04-26-1920   Cancelled treatment:       Reason Eval/Treat Not Completed: Patient's level of consciousness. Pt currently insufficiently arousable for evaluation. Will continue efforts. RN aware.  Celia B. Quentin [facility], CCC-SLP [phone]  Shonna Chock 05/05/2014, 11:48 AM

## 2014-05-05 NOTE — H&P (Addendum)
[facility] Admission History and Physical       Judith Zuniga XLK:[id number] DOB: 26-Jun-1920 DOA: 05/04/2014  Referring physician: EDP PCP: Jani Gravel, MD  Specialists:   Chief Complaint: Confusion, Slurred Speech and Right Facial Droop  HPI: Judith Zuniga is a 81 y.o. female with a history of CAD/Ischemic Cardiomyopathy, Complete AV Block S/P St Jude Biventricular Pacemaker, Chronic Atrial Fibrillation on Coumadin Rx, Systolic CHF, DM2, HTN, PAD, and Dementia who was brought to the ED via EMS due to symptoms of Increased Confusion, Slurring of her speech and a right facial droop noticed by her daughter at 3 pm during the day following her having an Angiogram and angioplasty of the Rt Lower Extremity by Interventional Radiology in short stay for an ischemic right foot.   The history is per the daughter who reports that her mother's words were not making sense, and her speech was slurred.    At baseline she has mild dementia and is on Namenda  Rx but lives alone and is able to perform all of her ADLs.         Review of Systems: Unable to Obtain from the Patient  Past Medical History  Diagnosis Date  . Atrioventricular block, complete s/p AV ablation   . Pacemaker -CRT- St Judes   . Type II or unspecified type diabetes mellitus without mention of complication, not stated as uncontrolled   . CAD (coronary artery disease)     s/p redo bypass surgery in 1998 with patent graft in Jan. 2009.   . Ischemic cardiomyopathy     ischemic heart myopathy , ejection fraction of 25%  . Systolic heart failure     class II and euvolemic  . Permanent atrial fibrillation   . Hyperlipidemia   . Diabetes mellitus   . Premature ventricular contractions     frequent  . Elevated liver function tests     on amiodarone  . Hypertension   . Dementia      Past Surgical History  Procedure Laterality Date  . Coronary artery bypass graft  1998    CABG x5: RIMA to diagonal one, RIMA to diagonal  2, SVG to PD, SVG to PL, SVG to circ  . Coronary artery bypass graft  1985    x3: LIMA to LAD, SVG to PD and SVG to PL  . Cardiac caths      multile  . Pacer generator change out      St. Jude pacemaker      Prior to Admission medications   Medication Sig Start Date End Date Taking? Authorizing Provider  atorvastatin (LIPITOR) 40 MG tablet Take 40 mg by mouth daily.  01/17/14  Yes Historical Provider, MD  bisoprolol (ZEBETA) 10 MG tablet Take 1 tablet (10 mg total) by mouth daily. 12/15/13  Yes Larey Dresser, MD  furosemide (LASIX) 40 MG tablet TAKE1/2 TABLET BY MOUTH EVERY DAY 02/16/14  Yes Larey Dresser, MD  glimepiride (AMARYL) 1 MG tablet Take 1 mg by mouth daily with breakfast.  06/12/13  Yes Historical Provider, MD  HYDROcodone-acetaminophen (NORCO/VICODIN) 5-325 MG per tablet Take 1 tablet by mouth every 6 (six) hours as needed for moderate pain.   Yes Historical Provider, MD  losartan (COZAAR) 50 MG tablet Take 50 mg by mouth daily.  08/23/11  Yes Larey Dresser, MD  Memantine HCl ER (NAMENDA XR) 28 MG CP24 Take 1 capsule by mouth daily.   Yes Historical Provider, MD  nitroGLYCERIN (NITRODUR -  DOSED IN MG/24 HR) 0.2 mg/hr patch Place 0.2 mg onto the skin daily.   Yes Historical Provider, MD  pentoxifylline (TRENTAL) 400 MG CR tablet Take 400 mg by mouth 3 (three) times daily with meals.   Yes Historical Provider, MD  potassium chloride (K-DUR) 10 MEQ tablet TAKE ONE TABLET BY MOUTH ONCE DAILY 01/06/14  Yes Larey Dresser, MD  warfarin (COUMADIN) 5 MG tablet Take 1 tablet (5 mg total) by mouth as directed. Patient taking differently: Take 5-7.5 mg by mouth daily. 5mg  daily except for on Wednesday take 7.5mg  02/16/14  Yes Larey Dresser, MD      No Known Allergies   Social History:  Lives Alone, walks holding onto [place] and [place], Able to perform all of her ADLs.     reports that she has never smoked. She has never used smokeless tobacco. She reports that she does not drink  alcohol or use illicit drugs.     Family History  Problem Relation Age of Onset  . Heart disease Father   . Other Mother     pacemaker  . Cancer Sister     gallbladder       Physical Exam:  GEN:  Pleasant Elderly Mildly Confused  Thin Frail  81 y.o. Caucasian female examined and in no acute distress; cooperative with exam Filed Vitals:   05/04/14 2115 05/04/14 2116  BP: 128/97 128/97  Pulse:  70  Temp:  98.1 F (36.7 C)  TempSrc:  Oral  Resp:  18  Height:  5\' 2"  (1.575 m)  Weight:  56.7 kg (125 lb)  SpO2:  95%   Blood pressure 128/97, pulse 70, temperature 98.1 F (36.7 C), temperature source Oral, resp. rate 18, height 5\' 2"  (1.575 m), weight 56.7 kg (125 lb), SpO2 95 %. PSYCH: She is alert and oriented x 3; does not appear anxious does not appear depressed; affect is normal HEENT: Normocephalic and Atraumatic, Mucous membranes pink; PERRLA; EOM intact; Fundi:  Benign;  No scleral icterus, Nares: Patent, Oropharynx: Clear, Fair Dentition,    Neck:  FROM, No Cervical Lymphadenopathy nor Thyromegaly or Carotid Bruit; No JVD; Breasts:: Not examined CHEST WALL: No tenderness CHEST: Normal respiration, clear to auscultation bilaterally HEART: Regular rate and rhythm; no murmurs rubs or gallops BACK: No kyphosis or scoliosis; No CVA tenderness ABDOMEN: Positive Bowel Sounds, Scaphoid, Soft Non-Tender; No Masses, No Organomegaly.   Rectal Exam: Not done EXTREMITIES: Right Foot Dressed and +Erythema of the distal 1/3 of the RLE.   LLE: No cyanosis, Clubbing or Edema  CNS:   Mental Status:  Alert, Oriented x 3, Thought Content Appropriate. Speech Fluent without evidence of Aphasia. Able to follow 3 step commands without difficulty.  In No obvious pain.     (Answers questions appropriately, has some difficulty with word finding, She knows who the president is, and what year it is, but does not know which hospital she is in  Cranial Nerves:  II: Discs flat bilaterally; Visual  fields Intact, Pupils equal and reactive.    III,IV, VI: Extra-ocular motions intact bilaterally    V,VII: +Right Facial Droop, facial light touch sensation normal bilaterally    VIII: hearing intact Bilaterally    IX,X: gag reflex present    XI: bilateral shoulder shrug    XII: midline tongue extension   Motor:  Right:  Upper extremity 5/5     Left:  Upper extremity 5/5     Right:  Lower extremity 5/5  Left:  Lower extremity 5/5     Tone and Bulk:  Decreased tone throughout; +Atrophy noted   Sensory:  Pinprick and light touch intact throughout, bilaterally   Deep Tendon Reflexes: 2+ and symmetric throughout   Plantars/ Babinski:  Right: Unable to Obtain due to Dressing       Left: equivocal    Cerebellar:  Finger to nose without difficulty.   Gait: deferred   Vascular:  LLE: Doppler : DP: Pulse Present    Labs on Admission:  Basic Metabolic Panel:  Recent Labs Lab 05/04/14 0800 05/04/14 2127 05/04/14 2156  NA 140 142 142  K 4.0 4.0 4.6  CL 101 107 107  CO2 29  --  27  GLUCOSE 180* 152* 149*  BUN 21 23 21   CREATININE 1.15* 1.10 1.21*  CALCIUM 9.0  --  8.7   Liver Function Tests:  Recent Labs Lab 05/04/14 2156  AST 60*  ALT 23  ALKPHOS 69  BILITOT 1.5*  PROT 6.4  ALBUMIN 2.8*   No results for input(s): LIPASE, AMYLASE in the last 168 hours. No results for input(s): AMMONIA in the last 168 hours. CBC:  Recent Labs Lab 05/04/14 0800 05/04/14 2127 05/04/14 2156  WBC 8.9  --  8.4  NEUTROABS 7.5  --  7.1  HGB 10.4* 10.2* 9.5*  HCT 32.0* 30.0* 28.8*  MCV 92.8  --  93.2  PLT 188  --  159   Cardiac Enzymes: No results for input(s): CKTOTAL, CKMB, CKMBINDEX, TROPONINI in the last 168 hours.  BNP (last 3 results)  Recent Labs  12/15/13 1403  PROBNP 429.0*   CBG:  Recent Labs Lab 05/04/14 0748 05/04/14 1724  GLUCAP 156* 88    Radiological Exams on Admission: Ct Head Wo Contrast  05/04/2014   CLINICAL DATA:  Right-sided facial droop and  increased confusion.  EXAM: CT HEAD WITHOUT CONTRAST  TECHNIQUE: Contiguous axial images were obtained from the base of the skull through the vertex without intravenous contrast.  COMPARISON:  07/07/2011  FINDINGS: Ventricles are normal configuration. There is ventricular and sulcal enlargement reflecting age related volume loss. No hydrocephalus.  No parenchymal masses or mass effect.  There is no evidence of a recent cortical infarct.  Patchy white matter hypoattenuation noted consistent with mild chronic microvascular ischemic change, stable.  No extra-axial masses or abnormal fluid collections.  There is no intracranial hemorrhage.  Visualized sinuses and mastoid air cells are clear.  IMPRESSION: 1. No acute intracranial abnormalities. 2. Age related volume loss. Mild chronic microvascular ischemic change.   Electronically Signed   By: Lajean Manes M.D.   On: 05/04/2014 23:22     EKG: Independently reviewed. Ventricular Paced Rhythm rate 70   Assessment/Plan:   81 y.o. female with  Principal Problem:   1.    CVA (cerebral infarction)- Rule Out.    Ct Head Negative   CVA Protocol   Neuro checks   Telemetry   ASA Rx   Neuro Consult  Active Problems:   2.   Delirium- CVA Workup  Monitor       3.   Ischemic cardiomyopathy/CAD, ARTERY BYPASS GRAFT/Systolic CHF   On Lasix, Bisoprolol, Losartan, and Atrovastatin     4.   Chronic atrial fibrillation   On coumadin Rx which has been on hold since 01/18 for procedure on 01/20   On Bisoprolol       5.   Diastolic CHF, chronic   On Lasix, Bisoprolol, and Losartan  6.   Hyperlipidemia   On Atorvastatin Rx     7.   Benign hypertensive heart disease with heart failure   On Bisoprolol, Losartan, and Lasix Rx     8.   PACEMAKER, CRT-St. Jude- due to Complete Heart Block   On Coumadin Rx     9.   Long term current use of anticoagulant   Resume coumadin Rx   Pharmacy adjustment of Coumadin   10.  Atherosclerosis of native  arteries of the extremities with ulceration   Vascular Procedure today by Interventional Radiology Dr. Laurence Ferrari    Angiogram, and Angioplasty done   11.  DM2-   Hold Amaryl   SSI coverage PRN   Check  HbA1C in AM    12.  DVT Prophylaxis   On Coumadin Rx     Code Status:    FULL CODE   Family Communication:    Daughter at Bedside Disposition Plan:       Observation/ Telemetry Bed  Time spent:  [facility] Pager [phone]   If [place] Please Contact the Day Rounding Team MD for [facility]  If 7PM-7AM, Please Contact Night-Floor Coverage  [email or web address] Password [email or web address]

## 2014-05-05 NOTE — Progress Notes (Signed)
Pt very agitated and confused. Pt restless and repeatedly saying that she needs to leave to go to her car and trying to get out of bed. On call MD notified for something to help with anxiety. 1mg  of Ativan was ordered and given. Will continue to monitor.

## 2014-05-05 NOTE — Progress Notes (Signed)
ANTICOAGULATION CONSULT NOTE - Initial Consult  Pharmacy Consult for heparin Indication: atrial fibrillation  No Known Allergies  Patient Measurements: Height: 5\' 2"  (157.5 cm) Weight: 121 lb 7.6 oz (55.1 kg) IBW/kg (Calculated) : 50.1 Heparin Dosing Weight: 55kg  Vital Signs: Temp: 98.2 F (36.8 C) (01/21 1600) Temp Source: Axillary (01/21 1600) BP: 136/49 mmHg (01/21 1600) Pulse Rate: 82 (01/21 1600)  Labs:  Recent Labs  05/04/14 0800 05/04/14 2127 05/04/14 2156 05/05/14 0514  HGB 10.4* 10.2* 9.5*  --   HCT 32.0* 30.0* 28.8*  --   PLT 188  --  159  --   APTT 39*  --   --   --   LABPROT 19.7*  --   --  18.7*  INR 1.65*  --   --  1.54*  CREATININE 1.15* 1.10 1.21*  --     Estimated Creatinine Clearance: 24 mL/min (by C-G formula based on Cr of 1.21).   Medical History: Past Medical History  Diagnosis Date  . Atrioventricular block, complete s/p AV ablation   . Pacemaker -CRT- [employer] Judes   . Type II or unspecified type diabetes mellitus without mention of complication, not stated as uncontrolled   . CAD (coronary artery disease)     s/p redo bypass surgery in 1998 with patent graft in Jan. 2009.   . Ischemic cardiomyopathy     ischemic heart myopathy , ejection fraction of 25%  . Systolic heart failure     class II and euvolemic  . Permanent atrial fibrillation   . Hyperlipidemia   . Diabetes mellitus   . Premature ventricular contractions     frequent  . Elevated liver function tests     on amiodarone  . Hypertension   . Dementia     Assessment: 72 YOF who is on chronic warfarin for AFib. She was supposed to resume warfarin tonight after angioplasty, however she exhibited stroke-like symptoms following the procedure, and is currently NPO (no meds). Spoke with Dr. Waldron Labs, and will start heparin drip tonight. INR 1.54. No bleeding noted.  Goal of Therapy:  Heparin level 0.3-0.5 units/ml Monitor platelets by anticoagulation protocol: Yes   Plan:   -no bolus d/t high baseline INR and per MD request. Start heparin at 750 units/hr -heparin level in 8 hours -daily heparin level and CBC -follow for ability to resume warfarin (medication orders were discontinued, but protocol order is still active for pharmacy to follow along)  Cori Justus D. Carrol Hougland, PharmD, BCPS Clinical Pharmacist Pager: [phone] 05/05/2014 6:08 PM

## 2014-05-05 NOTE — ED Notes (Signed)
Family at bedside. 

## 2014-05-05 NOTE — Progress Notes (Signed)
ANTICOAGULATION CONSULT NOTE - Initial Consult  Pharmacy Consult for Coumadin Indication: atrial fibrillation  No Known Allergies  Patient Measurements: Height: 5\' 2"  (157.5 cm) Weight: 121 lb 7.6 oz (55.1 kg) IBW/kg (Calculated) : 50.1  Vital Signs: Temp: 98.4 F (36.9 C) (01/21 0300) Temp Source: Oral (01/21 0300) BP: 124/50 mmHg (01/21 0300) Pulse Rate: 73 (01/21 0300)  Labs:  Recent Labs  05/04/14 0800 05/04/14 2127 05/04/14 2156  HGB 10.4* 10.2* 9.5*  HCT 32.0* 30.0* 28.8*  PLT 188  --  159  APTT 39*  --   --   LABPROT 19.7*  --   --   INR 1.65*  --   --   CREATININE 1.15* 1.10 1.21*    Estimated Creatinine Clearance: 24 mL/min (by C-G formula based on Cr of 1.21).   Medical History: Past Medical History  Diagnosis Date  . Atrioventricular block, complete s/p AV ablation   . Pacemaker -CRT- [employer] Judes   . Type II or unspecified type diabetes mellitus without mention of complication, not stated as uncontrolled   . CAD (coronary artery disease)     s/p redo bypass surgery in 1998 with patent graft in Jan. 2009.   . Ischemic cardiomyopathy     ischemic heart myopathy , ejection fraction of 25%  . Systolic heart failure     class II and euvolemic  . Permanent atrial fibrillation   . Hyperlipidemia   . Diabetes mellitus   . Premature ventricular contractions     frequent  . Elevated liver function tests     on amiodarone  . Hypertension   . Dementia     Medications:  Prescriptions prior to admission  Medication Sig Dispense Refill Last Dose  . atorvastatin (LIPITOR) 40 MG tablet Take 40 mg by mouth daily.    05/03/2014 at Unknown time  . bisoprolol (ZEBETA) 10 MG tablet Take 1 tablet (10 mg total) by mouth daily. 30 tablet 6 05/03/2014 at 1000  . furosemide (LASIX) 40 MG tablet TAKE1/2 TABLET BY MOUTH EVERY DAY 30 tablet 1 05/03/2014 at Unknown time  . glimepiride (AMARYL) 1 MG tablet Take 1 mg by mouth daily with breakfast.    05/03/2014 at Unknown time   . HYDROcodone-acetaminophen (NORCO/VICODIN) 5-325 MG per tablet Take 1 tablet by mouth every 6 (six) hours as needed for moderate pain.   05/04/2014 at Unknown time  . losartan (COZAAR) 50 MG tablet Take 50 mg by mouth daily.    05/03/2014 at Unknown time  . Memantine HCl ER (NAMENDA XR) 28 MG CP24 Take 1 capsule by mouth daily.   05/03/2014 at Unknown time  . nitroGLYCERIN (NITRODUR - DOSED IN MG/24 HR) 0.2 mg/hr patch Place 0.2 mg onto the skin daily.   05/03/2014 at Unknown time  . pentoxifylline (TRENTAL) 400 MG CR tablet Take 400 mg by mouth 3 (three) times daily with meals.   05/03/2014 at Unknown time  . potassium chloride (K-DUR) 10 MEQ tablet TAKE ONE TABLET BY MOUTH ONCE DAILY 90 tablet 1 05/03/2014 at Unknown time  . warfarin (COUMADIN) 5 MG tablet Take 1 tablet (5 mg total) by mouth as directed. (Patient taking differently: Take 5-7.5 mg by mouth daily. 5mg  daily except for on Wednesday take 7.5mg ) 100 tablet 0 Past Week at Unknown time   Scheduled:  .  stroke: mapping our early stages of recovery book   Does not apply Once  . aspirin  300 mg Rectal Daily   Or  . aspirin  325 mg Oral Daily  . atorvastatin  40 mg Oral Daily  . bisoprolol  10 mg Oral Daily  . furosemide  20 mg Oral Daily  . insulin aspart  0-9 Units Subcutaneous 6 times per day  . losartan  50 mg Oral Daily  . memantine  28 mg Oral Daily  . potassium chloride  10 mEq Oral Daily  . warfarin  7.5 mg Oral ONCE-1800  . Warfarin - Pharmacist Dosing Inpatient   Does not apply q1800    Assessment: 81yo female had procedure today under anesthesia and then c/o facial droop and confusion after discharged home, admitted for r/o CVA, to continue Coumadin for Afib; current INR below goal, per procedure instructions pt was supposed to resume Coumadin 1/21.  Goal of Therapy:  INR 2-3   Plan:  Will give boosted Coumadin dose of 7.5mg  po x1 today and monitor INR for dose adjustments.  Wynona Neat, PharmD, BCPS  05/05/2014,3:20  AM

## 2014-05-06 DIAGNOSIS — E1152 Type 2 diabetes mellitus with diabetic peripheral angiopathy with gangrene: Secondary | ICD-10-CM | POA: Diagnosis not present

## 2014-05-06 DIAGNOSIS — I110 Hypertensive heart disease with heart failure: Secondary | ICD-10-CM | POA: Diagnosis not present

## 2014-05-06 DIAGNOSIS — I509 Heart failure, unspecified: Secondary | ICD-10-CM | POA: Diagnosis not present

## 2014-05-06 DIAGNOSIS — E0859 Diabetes mellitus due to underlying condition with other circulatory complications: Secondary | ICD-10-CM | POA: Diagnosis not present

## 2014-05-06 DIAGNOSIS — I25810 Atherosclerosis of coronary artery bypass graft(s) without angina pectoris: Secondary | ICD-10-CM

## 2014-05-06 DIAGNOSIS — D649 Anemia, unspecified: Secondary | ICD-10-CM | POA: Diagnosis not present

## 2014-05-06 DIAGNOSIS — I5032 Chronic diastolic (congestive) heart failure: Secondary | ICD-10-CM | POA: Diagnosis not present

## 2014-05-06 DIAGNOSIS — G9340 Encephalopathy, unspecified: Secondary | ICD-10-CM | POA: Diagnosis not present

## 2014-05-06 DIAGNOSIS — I739 Peripheral vascular disease, unspecified: Secondary | ICD-10-CM | POA: Diagnosis not present

## 2014-05-06 DIAGNOSIS — R4701 Aphasia: Secondary | ICD-10-CM | POA: Diagnosis not present

## 2014-05-06 DIAGNOSIS — I70261 Atherosclerosis of native arteries of extremities with gangrene, right leg: Secondary | ICD-10-CM | POA: Diagnosis not present

## 2014-05-06 DIAGNOSIS — I70211 Atherosclerosis of native arteries of extremities with intermittent claudication, right leg: Secondary | ICD-10-CM | POA: Diagnosis not present

## 2014-05-06 DIAGNOSIS — Z950 Presence of cardiac pacemaker: Secondary | ICD-10-CM | POA: Diagnosis not present

## 2014-05-06 DIAGNOSIS — E785 Hyperlipidemia, unspecified: Secondary | ICD-10-CM | POA: Insufficient documentation

## 2014-05-06 DIAGNOSIS — E876 Hypokalemia: Secondary | ICD-10-CM | POA: Diagnosis not present

## 2014-05-06 DIAGNOSIS — I6523 Occlusion and stenosis of bilateral carotid arteries: Secondary | ICD-10-CM | POA: Diagnosis present

## 2014-05-06 DIAGNOSIS — I5042 Chronic combined systolic (congestive) and diastolic (congestive) heart failure: Secondary | ICD-10-CM | POA: Diagnosis present

## 2014-05-06 DIAGNOSIS — F0390 Unspecified dementia without behavioral disturbance: Secondary | ICD-10-CM | POA: Diagnosis present

## 2014-05-06 DIAGNOSIS — I255 Ischemic cardiomyopathy: Secondary | ICD-10-CM | POA: Diagnosis present

## 2014-05-06 DIAGNOSIS — I7025 Atherosclerosis of native arteries of other extremities with ulceration: Secondary | ICD-10-CM | POA: Diagnosis not present

## 2014-05-06 DIAGNOSIS — L97811 Non-pressure chronic ulcer of other part of right lower leg limited to breakdown of skin: Secondary | ICD-10-CM | POA: Diagnosis not present

## 2014-05-06 DIAGNOSIS — I639 Cerebral infarction, unspecified: Secondary | ICD-10-CM | POA: Diagnosis not present

## 2014-05-06 DIAGNOSIS — L98499 Non-pressure chronic ulcer of skin of other sites with unspecified severity: Secondary | ICD-10-CM | POA: Diagnosis not present

## 2014-05-06 DIAGNOSIS — R451 Restlessness and agitation: Secondary | ICD-10-CM | POA: Diagnosis not present

## 2014-05-06 DIAGNOSIS — I5022 Chronic systolic (congestive) heart failure: Secondary | ICD-10-CM | POA: Diagnosis not present

## 2014-05-06 DIAGNOSIS — I482 Chronic atrial fibrillation: Secondary | ICD-10-CM | POA: Diagnosis not present

## 2014-05-06 DIAGNOSIS — E1165 Type 2 diabetes mellitus with hyperglycemia: Secondary | ICD-10-CM | POA: Diagnosis present

## 2014-05-06 DIAGNOSIS — T50905A Adverse effect of unspecified drugs, medicaments and biological substances, initial encounter: Secondary | ICD-10-CM | POA: Diagnosis not present

## 2014-05-06 DIAGNOSIS — D62 Acute posthemorrhagic anemia: Secondary | ICD-10-CM | POA: Diagnosis not present

## 2014-05-06 DIAGNOSIS — M6281 Muscle weakness (generalized): Secondary | ICD-10-CM | POA: Diagnosis not present

## 2014-05-06 DIAGNOSIS — Z89511 Acquired absence of right leg below knee: Secondary | ICD-10-CM | POA: Diagnosis not present

## 2014-05-06 DIAGNOSIS — G8918 Other acute postprocedural pain: Secondary | ICD-10-CM | POA: Diagnosis not present

## 2014-05-06 DIAGNOSIS — Z7901 Long term (current) use of anticoagulants: Secondary | ICD-10-CM | POA: Diagnosis not present

## 2014-05-06 DIAGNOSIS — I96 Gangrene, not elsewhere classified: Secondary | ICD-10-CM | POA: Diagnosis not present

## 2014-05-06 DIAGNOSIS — I2510 Atherosclerotic heart disease of native coronary artery without angina pectoris: Secondary | ICD-10-CM | POA: Diagnosis present

## 2014-05-06 DIAGNOSIS — Z8249 Family history of ischemic heart disease and other diseases of the circulatory system: Secondary | ICD-10-CM | POA: Diagnosis not present

## 2014-05-06 DIAGNOSIS — Z951 Presence of aortocoronary bypass graft: Secondary | ICD-10-CM | POA: Diagnosis not present

## 2014-05-06 DIAGNOSIS — M79671 Pain in right foot: Secondary | ICD-10-CM | POA: Diagnosis not present

## 2014-05-06 LAB — GLUCOSE, CAPILLARY
GLUCOSE-CAPILLARY: 142 mg/dL — AB (ref 70–99)
GLUCOSE-CAPILLARY: 244 mg/dL — AB (ref 70–99)
GLUCOSE-CAPILLARY: 67 mg/dL — AB (ref 70–99)
GLUCOSE-CAPILLARY: 85 mg/dL (ref 70–99)
Glucose-Capillary: 78 mg/dL (ref 70–99)
Glucose-Capillary: 73 mg/dL (ref 70–99)
Glucose-Capillary: 142 mg/dL — ABNORMAL HIGH (ref 70–99)

## 2014-05-06 LAB — CBC
HEMATOCRIT: 26.3 % — AB (ref 36.0–46.0)
HEMOGLOBIN: 8.5 g/dL — AB (ref 12.0–15.0)
MCH: 30.1 pg (ref 26.0–34.0)
MCHC: 32.3 g/dL (ref 30.0–36.0)
MCV: 93.3 fL (ref 78.0–100.0)
Platelets: 152 10*3/uL (ref 150–400)
RBC: 2.82 MIL/uL — AB (ref 3.87–5.11)
RDW: 13.8 % (ref 11.5–15.5)
WBC: 6.3 10*3/uL (ref 4.0–10.5)

## 2014-05-06 LAB — BASIC METABOLIC PANEL
Anion gap: 9 (ref 5–15)
BUN: 19 mg/dL (ref 6–23)
CHLORIDE: 113 meq/L — AB (ref 96–112)
CO2: 24 mmol/L (ref 19–32)
CREATININE: 1.05 mg/dL (ref 0.50–1.10)
Calcium: 8.5 mg/dL (ref 8.4–10.5)
GFR calc non Af Amer: 45 mL/min — ABNORMAL LOW (ref >90)
GFR, EST AFRICAN AMERICAN: 52 mL/min — AB (ref >90)
GLUCOSE: 85 mg/dL (ref 70–99)
Potassium: 3.4 mmol/L — ABNORMAL LOW (ref 3.5–5.1)
SODIUM: 146 mmol/L — AB (ref 135–145)

## 2014-05-06 LAB — PROTIME-INR
INR: 1.56 — AB (ref 0.00–1.49)
INR: 1.45 (ref 0.00–1.49)
PROTHROMBIN TIME: 17.8 s — AB (ref 11.6–15.2)
Prothrombin Time: 18.8 seconds — ABNORMAL HIGH (ref 11.6–15.2)

## 2014-05-06 LAB — HEPARIN LEVEL (UNFRACTIONATED): Heparin Unfractionated: <0.1 IU/mL — ABNORMAL LOW (ref 0.30–0.70)

## 2014-05-06 MED ORDER — DEXTROSE 50 % IV SOLN: 25.0000 mL | Freq: Once | INTRAVENOUS | Status: DC

## 2014-05-06 MED ORDER — ACETAMINOPHEN 650 MG RE SUPP: 325.0000 mg | RECTAL | Status: DC | PRN

## 2014-05-06 MED ORDER — WARFARIN SODIUM 7.5 MG PO TABS
7.5000 mg | ORAL_TABLET | Freq: Once | ORAL | Status: AC
  Administered 2014-05-06: 7.5 mg via ORAL
  Filled 2014-05-06: qty 1

## 2014-05-06 MED ORDER — WARFARIN - PHARMACIST DOSING INPATIENT: Freq: Every day | Status: DC

## 2014-05-06 MED ORDER — DEXTROSE 50 % IV SOLN
INTRAVENOUS | Status: AC
  Administered 2014-05-06: 25 mL
  Filled 2014-05-06: qty 50

## 2014-05-06 MED ORDER — ACETAMINOPHEN 325 MG PO TABS
325.0000 mg | ORAL_TABLET | Freq: Four times a day (QID) | ORAL | Status: DC | PRN
  Administered 2014-05-06 – 2014-05-15 (×3): 325 mg via ORAL
  Filled 2014-05-06 (×3): qty 1

## 2014-05-06 MED ORDER — ASPIRIN EC 81 MG PO TBEC
81.0000 mg | DELAYED_RELEASE_TABLET | Freq: Every day | ORAL | Status: DC
  Administered 2014-05-07 – 2014-05-15 (×8): 81 mg via ORAL
  Filled 2014-05-06 (×9): qty 1

## 2014-05-06 MED ORDER — MAGNESIUM CITRATE PO SOLN
1.0000 | Freq: Once | ORAL | Status: AC
  Administered 2014-05-06: 1 via ORAL
  Filled 2014-05-06: qty 296

## 2014-05-06 NOTE — Progress Notes (Signed)
CRITICAL VALUE ALERT  Critical value received:  CBG 67  Date of notification:  05/06/14  Time of notification:  0045  Critical value read back:Yes.    Nurse who received alert:  Jetty Duhamel  MD notified (1st page):  Kathline Magic  Time of first page:  0100  MD notified (2nd page):  Time of second page:  Responding MD:  Kathline Magic  Time MD responded:  0105   84ml of D50 was given. CBG rechecked 10 minutes later. CBG increased to 147. MD was made aware. No new orders given at this time.

## 2014-05-06 NOTE — Progress Notes (Signed)
ANTICOAGULATION CONSULT NOTE - Initial Consult  Pharmacy Consult for Coumadin Indication: atrial fibrillation  No Known Allergies  Patient Measurements: Height: 5\' 2"  (157.5 cm) Weight: 121 lb 7.6 oz (55.1 kg) IBW/kg (Calculated) : 50.1 Vital Signs: Temp: 98.2 F (36.8 C) (01/22 1309) Temp Source: Oral (01/22 1309) BP: 156/51 mmHg (01/22 1309) Pulse Rate: 73 (01/22 1309)  Labs:  Recent Labs  05/04/14 0800 05/04/14 2127 05/04/14 2156 05/05/14 0514 05/06/14 0406 05/06/14 0500  HGB 10.4* 10.2* 9.5*  --   --  8.5*  HCT 32.0* 30.0* 28.8*  --   --  26.3*  PLT 188  --  159  --   --  152  APTT 39*  --   --   --   --   --   LABPROT 19.7*  --   --  18.7* 17.8*  18.8*  --   INR 1.65*  --   --  1.54* 1.45  1.56*  --   HEPARINUNFRC  --   --   --   --  <0.10*  --   CREATININE 1.15* 1.10 1.21*  --  1.05  --     Estimated Creatinine Clearance: 27.6 mL/min (by C-G formula based on Cr of 1.05).   Medical History: Past Medical History  Diagnosis Date  . Atrioventricular block, complete s/p AV ablation   . Pacemaker -CRT- [employer] Judes   . Type II or unspecified type diabetes mellitus without mention of complication, not stated as uncontrolled   . CAD (coronary artery disease)     s/p redo bypass surgery in 1998 with patent graft in Jan. 2009.   . Ischemic cardiomyopathy     ischemic heart myopathy , ejection fraction of 25%  . Systolic heart failure     class II and euvolemic  . Permanent atrial fibrillation   . Hyperlipidemia   . Diabetes mellitus   . Premature ventricular contractions     frequent  . Elevated liver function tests     on amiodarone  . Hypertension   . Dementia    Assessment: 81 year old female on chronic Coumadin for atrial fibrillation who was NPO yesterday and started on IV heparin now to resume Coumadin. Heparin has been discontinued per Dr. Waldron Labs. INR today is ~1.5. H/H down slightly (8.5/26.3), platelets 152- stable. Repeat CT - no hemorrhage.    Home regimen: 5mg  daily except for 7.5mg  on Wednesday.   Goal of Therapy:  INR 2-3 Monitor platelets by anticoagulation protocol: Yes   Plan:  Coumadin 7.5mg  po x1 tonight.  Daily PT/INR.   Brain Hilts 05/06/2014,1:27 PM

## 2014-05-06 NOTE — Evaluation (Signed)
Clinical/Bedside Swallow Evaluation Patient Details  Name: Judith Zuniga MRN: [id number] Date of Birth: March 01, 1921  Today's Date: 05/06/2014 Time: SLP Start Time (ACUTE ONLY): 1139 SLP Stop Time (ACUTE ONLY): 1149 SLP Time Calculation (min) (ACUTE ONLY): 10 min  Past Medical History:  Past Medical History  Diagnosis Date  . Atrioventricular block, complete s/p AV ablation   . Pacemaker -CRT- [employer] Judes   . Type II or unspecified type diabetes mellitus without mention of complication, not stated as uncontrolled   . CAD (coronary artery disease)     s/p redo bypass surgery in 1998 with patent graft in Jan. 2009.   . Ischemic cardiomyopathy     ischemic heart myopathy , ejection fraction of 25%  . Systolic heart failure     class II and euvolemic  . Permanent atrial fibrillation   . Hyperlipidemia   . Diabetes mellitus   . Premature ventricular contractions     frequent  . Elevated liver function tests     on amiodarone  . Hypertension   . Dementia    Past Surgical History:  Past Surgical History  Procedure Laterality Date  . Coronary artery bypass graft  1998    CABG x5: RIMA to diagonal one, RIMA to diagonal 2, SVG to PD, SVG to PL, SVG to circ  . Coronary artery bypass graft  1985    x3: LIMA to LAD, SVG to PD and SVG to PL  . Cardiac caths      multile  . Pacer generator change out      St. Jude pacemaker   HPI:  Patient is a 81 y/o female who is s/p angiography and revascularization of her RLE on 05/04/14. During the procedure the pt experienced agitation and confusion immediately following administration of the initial dose of conscious sedation and the procedure was completed with some difficulty. She did return to the ER later that evening / early the next morning with worsening aphasia, right facial droop and slurring. Ct head- (-). PMH of dementia, on chronic coumadin (this was held for five days recently for procedure on 05/04/2014--recent INR on 1/20 1.54), pacemker,  CAD, DM.    Assessment / Plan / Recommendation Clinical Impression  Pt's oropharyngeal swallow appears WFL with no overt signs of aspiration. Pt demonstrated adequate oral manipulation and transit with all textures. Recommend regular diet and thin liquids without need for SLP f/u at this time.    Aspiration Risk  Mild    Diet Recommendation Regular;Thin liquid   Liquid Administration via: Cup;Straw Medication Administration: Whole meds with liquid Supervision: Patient able to self feed;Intermittent supervision to cue for compensatory strategies Compensations: Slow rate;Small sips/bites Postural Changes and/or Swallow Maneuvers: Seated upright 90 degrees    Other  Recommendations Oral Care Recommendations: Oral care BID   Follow Up Recommendations  None    Frequency and Duration        Pertinent Vitals/Pain n/a    SLP Swallow Goals     Swallow Study Prior Functional Status  Type of Home: House Available Help at Discharge: Family;Available PRN/intermittently    General Date of Onset: 05/04/14 HPI: Patient is a 81 y/o female who is s/p angiography and revascularization of her RLE on 05/04/14. During the procedure the pt experienced agitation and confusion immediately following administration of the initial dose of conscious sedation and the procedure was completed with some difficulty. She did return to the ER later that evening / early the next morning with worsening aphasia, right facial  droop and slurring. Ct head- (-). PMH of dementia, on chronic coumadin (this was held for five days recently for procedure on 05/04/2014--recent INR on 1/20 1.54), pacemker, CAD, DM.  Type of Study: Bedside swallow evaluation Previous Swallow Assessment: none in chart Diet Prior to this Study: NPO Temperature Spikes Noted: No Respiratory Status: Room air History of Recent Intubation: No Behavior/Cognition: Alert;Cooperative;Pleasant mood;Confused;Requires cueing Oral Cavity - Dentition:  Adequate natural dentition Self-Feeding Abilities: Able to feed self Patient Positioning: Upright in chair Baseline Vocal Quality: Clear Volitional Cough: Strong Volitional Swallow: Able to elicit    Oral/Motor/Sensory Function Overall Oral Motor/Sensory Function: Appears within functional limits for tasks assessed (mild, generalized weakness)   Ice Chips Ice chips: Not tested   Thin Liquid Thin Liquid: Within functional limits Presentation: Cup;Self Fed;Straw    Nectar Thick Nectar Thick Liquid: Not tested   Honey Thick Honey Thick Liquid: Not tested   Puree Puree: Within functional limits Presentation: Self Fed;Spoon   Solid   GO    Solid: Within functional limits Presentation: Self Fed       [name], M.A. CCC-SLP [phone]  [name] 05/06/2014,1:22 PM

## 2014-05-06 NOTE — Progress Notes (Signed)
Chief Complaint: Chief Complaint  Patient presents with  . Extremity Weakness  . Stroke Symptoms   History of Present Illness: Judith Zuniga is a 81 y.o. female with Rutherford class 5 CLI of the RLE.  She underwent angiography and revascularization of her RLE on Wednesday 05/04/14.  She is chronically anticoagulated on coumadin for AFIB.  Her coumadin was held for 5 days prior to the procedure.  On the day of the procedure her INR was 1.6.  During the procedure, she experienced agitation and confusion immediately following administration of the initial dose of conscious sedation.  The procedure was completed with some difficulty with only fentanyl administered from that point on.     During the procedure she was anticoagulated on heparin and multiple ACTs were checked throughout the procedure.  At one point, a small amount of thrombus had formed on a plaque in the distal posterior tibial artery.  A total of 2 mg tPA was administered in 1 mg aliquots several minutes apart which cleared the thrombus.    On post operative evaluation later that evening, she was pleasant and smiling but remained confused and slightly disoriented.  I did not note any facial droop or slurring at that time.  I discussed with her daughter the cumulative effects of the sedation, possible sundowning and loss of contextual clues being out of her normal environment as the likely cause of her confusion.  We elected to discharge her home with instructions to return to the ER if her condition worsened.    Unfortunately, she did return to the ER later that evening / early the next morning with worsening aphasia, right facial droop and slurring.  She was admitted and neurology consulted.  She is undergoing work up for delirium vs. stroke although it appears that stroke is the likely diagnosis.   This morning, Judith Zuniga is resting calmly in her bed.  She has a visible right facial droop and complains that she needs to spit.  She  nods her head no when asked if she has any pain or discomfort. She is moving all 4 extremities.  She is unable to answer basic orientation questions.   Past Medical History  Diagnosis Date  . Atrioventricular block, complete s/p AV ablation   . Pacemaker -CRT- [employer]   . Type II or unspecified type diabetes mellitus without mention of complication, not stated as uncontrolled   . CAD (coronary artery disease)     s/p redo bypass surgery in 1998 with patent graft in Jan. 2009.   . Ischemic cardiomyopathy     ischemic heart myopathy , ejection fraction of 25%  . Systolic heart failure     class II and euvolemic  . Permanent atrial fibrillation   . Hyperlipidemia   . Diabetes mellitus   . Premature ventricular contractions     frequent  . Elevated liver function tests     on amiodarone  . Hypertension   . Dementia     Past Surgical History  Procedure Laterality Date  . Coronary artery bypass graft  1998    CABG x5: RIMA to diagonal one, RIMA to diagonal 2, SVG to PD, SVG to PL, SVG to circ  . Coronary artery bypass graft  1985    x3: LIMA to LAD, SVG to PD and SVG to PL  . Cardiac caths      multile  . Pacer generator change out      [employer] pacemaker  Allergies: Review of patient's allergies indicates no known allergies.  Medications: Prior to Admission medications   Medication Sig Start Date End Date Taking? Authorizing Provider  atorvastatin (LIPITOR) 40 MG tablet Take 40 mg by mouth daily.  01/17/14  Yes Historical Provider, MD  bisoprolol (ZEBETA) 10 MG tablet Take 1 tablet (10 mg total) by mouth daily. 12/15/13  Yes Larey Dresser, MD  furosemide (LASIX) 40 MG tablet TAKE1/2 TABLET BY MOUTH EVERY DAY 02/16/14  Yes Larey Dresser, MD  glimepiride (AMARYL) 1 MG tablet Take 1 mg by mouth daily with breakfast.  06/12/13  Yes Historical Provider, MD  HYDROcodone-acetaminophen (NORCO/VICODIN) 5-325 MG per tablet Take 1 tablet by mouth every 6 (six) hours as needed for  moderate pain.   Yes Historical Provider, MD  losartan (COZAAR) 50 MG tablet Take 50 mg by mouth daily.  08/23/11  Yes Larey Dresser, MD  Memantine HCl ER (NAMENDA XR) 28 MG CP24 Take 1 capsule by mouth daily.   Yes Historical Provider, MD  nitroGLYCERIN (NITRODUR - DOSED IN MG/24 HR) 0.2 mg/hr patch Place 0.2 mg onto the skin daily.   Yes Historical Provider, MD  pentoxifylline (TRENTAL) 400 MG CR tablet Take 400 mg by mouth 3 (three) times daily with meals.   Yes Historical Provider, MD  potassium chloride (K-DUR) 10 MEQ tablet TAKE ONE TABLET BY MOUTH ONCE DAILY 01/06/14  Yes Larey Dresser, MD  warfarin (COUMADIN) 5 MG tablet Take 1 tablet (5 mg total) by mouth as directed. Patient taking differently: Take 5-7.5 mg by mouth daily. 5mg  daily except for on Wednesday take 7.5mg  02/16/14  Yes Larey Dresser, MD    Family History  Problem Relation Age of Onset  . Heart disease Father   . Other Mother     pacemaker  . Cancer Sister     gallbladder    History   Social History  . Marital Status: Widowed    Spouse Name: N/A    Number of Children: 1  . Years of Education: N/A   Occupational History  . Retired    Social History Main Topics  . Smoking status: Never Smoker   . Smokeless tobacco: Never Used  . Alcohol Use: No  . Drug Use: No  . Sexual Activity: None   Other Topics Concern  . 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     Review of Systems: A 12 point ROS discussed and pertinent positives are indicated in the HPI above.  All other systems are negative.  Review of Systems  Vital Signs: BP 120/64 mmHg  Pulse 78  Temp(Src) 98 F (36.7 C) (Axillary)  Resp 18  Ht 5\' 2"  (1.575 m)  Wt 121 lb 7.6 oz (55.1 kg)  BMI 22.21 kg/m2  SpO2 97%  Physical Exam   Left groin: Mild bruising but soft, NT and no hematoma. Right Foot: Bandage remains in place.  Dorsum of foot remains  warm to the touch.    Imaging: Ct Head Wo Contrast  05/05/2014   CLINICAL DATA:  Subsequent evaluation of stroke 24 hr after administration of TPA  EXAM: CT HEAD WITHOUT CONTRAST  TECHNIQUE: Contiguous axial images were obtained from the base of the skull through the vertex without intravenous contrast.  COMPARISON:  05/04/2014  FINDINGS: There is no evidence of hemorrhage or extra-axial fluid. Again identified is moderate diffuse atrophy and moderate low attenuation  diffusely in the deep white matter consistent with chronic small vessel ischemic change. No evidence of vascular territory infarct. No hydrocephalus or mass.  IMPRESSION: No significant change from yesterday's examination. No hemorrhage or extra-axial fluid.   Electronically Signed   By: Skipper Cliche M.D.   On: 05/05/2014 10:18   Ct Head Wo Contrast  05/04/2014   CLINICAL DATA:  Right-sided facial droop and increased confusion.  EXAM: CT HEAD WITHOUT CONTRAST  TECHNIQUE: Contiguous axial images were obtained from the base of the skull through the vertex without intravenous contrast.  COMPARISON:  07/07/2011  FINDINGS: Ventricles are normal configuration. There is ventricular and sulcal enlargement reflecting age related volume loss. No hydrocephalus.  No parenchymal masses or mass effect.  There is no evidence of a recent cortical infarct.  Patchy white matter hypoattenuation noted consistent with mild chronic microvascular ischemic change, stable.  No extra-axial masses or abnormal fluid collections.  There is no intracranial hemorrhage.  Visualized sinuses and mastoid air cells are clear.  IMPRESSION: 1. No acute intracranial abnormalities. 2. Age related volume loss. Mild chronic microvascular ischemic change.   Electronically Signed   By: Lajean Manes M.D.   On: 05/04/2014 23:22   Ir Angiogram Extremity Right  05/05/2014   CLINICAL DATA:  81 year old female with Rutherford stage 5 critical limb ischemia involving the right forefoot and  great toe in the distribution of the anterior tibial artery. Has a pacemaker and cannot undergo MRI angiography. She presents for diagnostic angiogram and possible intervention to revascularize the anterior tibial angio zone. Her Coumadin has been held for the past several days. Her INR is 1.6 today.  EXAM: RIGHT EXTREMITY ARTERIOGRAPHY; IR TIB-PERO ART ATHEREC INC PTA; IR ULTRASOUND GUIDANCE VASC ACCESS LEFT  Date: 05/05/2014  PROCEDURE: 1. Ultrasound-guided puncture of the left common femoral artery. 2. Catheterization of the distal abdominal aorta with a distal abdominal aortic and pelvic arteriography. 3. Often over catheterization of the right common femoral artery. Right lower extremity runoff arteriogram. 4. Catheterization of the anterior tibial artery with arteriogram 5. Attempted recanalization of the chronically occluded right anterior tibial artery. 6. Catheterization of the posterior tibial artery. Atherectomy of the posterior tibial artery. 7. Angioplasty of the posterior tibial artery to 2.5 mm. Interventional Radiologist:  Criselda Peaches, MD  ANESTHESIA/SEDATION: Moderate (conscious) sedation was used. 1 mg Versed, 225 mcg Fentanyl were administered intravenously. The patient's vital signs were monitored continuously by radiology nursing throughout the procedure.  Sedation Time: 250 minutes  MEDICATIONS: Throughout the course of the procedure, nitroglycerin, heparin and 2 mg of TPA were administered. Additionally, 2 g Ancef were administered intravenously.  FLUOROSCOPY TIME:  66 min 42 seconds  643.7 mGy  CONTRAST:  147mL VISIPAQUE IODIXANOL 320 MG/ML IV SOLN  TECHNIQUE: Informed consent was obtained from the patient following explanation of the procedure, risks, benefits and alternatives. The patient understands, agrees and consents for the procedure. All questions were addressed. A time out was performed.  Maximal barrier sterile technique utilized including caps, mask, sterile gowns, sterile  gloves, large sterile drape, hand hygiene, and Betadine skin prep.  The left groin was interrogated with ultrasound. The common femoral artery was found to be widely patent. Local anesthesia was attained by infiltration with 1% lidocaine. A small dermatotomy was made. Under real-time sonographic guidance, the left common femoral artery was punctured with a 21 gauge micropuncture needle. With the assistance of a 4 [place] transitional micro sheath, the micro wire was exchanged for a Bentson wire which  was advanced in the abdominal aorta. Over this, a working 4 [place] vascular sheath was advanced of the common femoral artery. A 4 French pigtail catheter was then advanced into the distal aorta and pelvic aortography was performed. The distal aorta is widely patent. The bilateral iliac arterial systems are widely patent although mildly tortuous. No significant stenosis.  Using a 4 French glide catheter and glidewire, the catheter was advanced often over and into the common femoral artery. The Bentson wire was advanced into the superficial femoral artery. A cook flexor 4 French vascular sheath was then advanced up- [place] the aortic bifurcation and into the superficial femoral artery. A right lower extremity runoff arteriogram was then performed.  Inflow: Widely patent bilateral iliac systems and common femoral arteries.  Outflow: Widely patent right superficial femoral and popliteal arteries.  Runoff: The right anterior tibial artery completely occludes approximately 2 cm beyond the origin. There is a hyper trophic collateral vessel. In the distal lower leg there appears to be faint recanalization of the anterior tibial artery. The dorsalis pedis artery is the red light at the level of the ankle. The tibioperoneal trunk is widely patent. The peroneal artery becomes markedly attenuated approximately 2 cm beyond the origin. The vessel continues into the mid calf but is very small. The posterior tibial artery demonstrates  multifocal disease. There is a short segment high-grade stenosis in the proximal third of the artery and a second short segment high-grade stenosis in the mid third of the artery. Distally, there are tandem moderate stenoses in the distal third of the artery. Additionally, there is focal stenosis in artery as it is passes posterior to the medial malleolus. On the lateral view, there is very little flow to the forefoot.  The Bentson wire was advanced into the popliteal artery and the 90 cm 4 French flexor sheath was advanced of the popliteal artery. Given the planned intervention, the patient was heparinized with a weight based dose of an initial 4000 units of heparin. ACTs were then drawn and additional heparinization was administered as needed to maintain anticoagulation. Using a 2.6 [place] CXI support catheter and Whisper Wire attempts were made to recanalized the right anterior tibial artery. This CXR catheter was advanced into the anterior tibial artery and arteriography was performed. Ultimately, I was only able to progress into the large hypertrophic collateral rather than the true anterior tibial lumen. Consideration was made towards pedal puncture for retrograde approach. However, the patient did not respond well to the initial dose of the Versed and fentanyl for conscious sedation. Her dementia was unmasked and she became agitated and combative. Although we were able to calm her down enough to continue with the procedure, she continued to move her lower extremities randomly. Ultrasound interrogation of the dorsalis pedis demonstrates a very small and calcified vessel. Pedal access is not possible given her current status.  Therefore, I elected to provide optimal revascularization of the posterior tibial artery in the hopes that this will provide additional flow through the plantar arch and into the forefoot.  Therefore, the CX site catheter in was for wire were advanced into the posterior tibial artery.  Arteriography demonstrate did almost no flow beyond the initial stenosis which was an interval change compared to the initial angiogram. Therefore, this was clip quickly crossed with a wire. Nitroglycerin was administered intra-arterially. The stenosis was angioplastied to 2 mm using a 2 x 200 mm are mild a balloon. Followup angiography confirmed restoration of flow. Therefore, pleural arteriography of the  posterior tibial artery was performed confirming a findings as described above.  The was per wire was advanced to the level of the medial malleolus. Unfortunately, I could not get the wire around the tight stenosis at the malleolus. The calcified stenoses throughout the posterior tibial artery with an after rectum eyes using a the [facility] CSI orbital atherectomy device with the 1.25 mm micro Crown. Atherectomy was performed only on the low setting. Following atherectomy, the posterior tibial artery was angioplastied first to 2 mm, and then to 2.5 mm using first a 2 x 150 and then a 2.5 x 150 mm sterling balloon. Inflations were held at 4 atmospheres for 2 min. Followup arteriography demonstrates significantly improved patency throughout the posterior tibial artery. Flow is brisk. There is residual significant stenosis at the medial malleolus in the on treated portion of the vessel. Flow remains patent in the medial and lateral calcaneal branches as well as in the proximal and distal arch is. Flow has improved compared to the pre intervention angiogram.  A final ACT was drawn. The catheters and wires were removed. The sheath was carefully brought back into the left external iliac artery and a limited left common femoral arteriogram was performed confirming access in the common femoral artery. Hemostasis was attained with the assistance of a 5 French Mynx Grip closure device.  COMPLICATIONS: None  IMPRESSION: 1. Right lower extremity runoff demonstrates widely patent inflow and outflow. There is extensive runoff  disease including chronic long segment occlusion of the anterior tibial artery and multifocal high-grade and moderate stenoses of the posterior tibial artery. The plantar arch remains intact. 2. Unsuccessful attempt at recanalization of the chronically occluded anterior tibial artery. Unfortunately, due to patient agitation and motion, pedal access for retrograde recanalization could not be performed. 3. Successful atherectomy and angioplasty of the diseased posterior tibial artery to 2.5 mm with significantly improved flow into the foot and plantar arch.  PLAN: 1. Bed rest for 4 hr prior to discharge home. 2. Resume Coumadin tomorrow. 3. Return to clinic in 2 weeks for repeat ABI and PVRs as well as wound check. 4. Will see patient every 6 weeks thereafter until wound has healed. 5. If the wound does not heal, could consider a second attempt at recanalization of the anterior tibial artery although a a different sedation strategy would have to be employed. I would discuss the risks and benefits of a second attempt given the patient's underlying dementia and confusion with benzodiazepines at length with her daughter. Signed,  Criselda Peaches, MD  [facility]  [facility]   Electronically Signed   By: Jacqulynn Cadet M.D.   On: 05/05/2014 18:00   [place] Pta Mod Sed  05/05/2014   CLINICAL DATA:  81 year old female with Rutherford stage 5 critical limb ischemia involving the right forefoot and great toe in the distribution of the anterior tibial artery. Has a pacemaker and cannot undergo MRI angiography. She presents for diagnostic angiogram and possible intervention to revascularize the anterior tibial angio zone. Her Coumadin has been held for the past several days. Her INR is 1.6 today.  EXAM: RIGHT EXTREMITY ARTERIOGRAPHY; IR TIB-PERO ART ATHEREC INC PTA; IR ULTRASOUND GUIDANCE VASC ACCESS LEFT  Date: 05/05/2014  PROCEDURE: 1.  Ultrasound-guided puncture of the left common femoral artery. 2. Catheterization of the distal abdominal aorta with a distal abdominal aortic and pelvic arteriography. 3. Often over catheterization of the right common femoral artery. Right lower extremity runoff  arteriogram. 4. Catheterization of the anterior tibial artery with arteriogram 5. Attempted recanalization of the chronically occluded right anterior tibial artery. 6. Catheterization of the posterior tibial artery. Atherectomy of the posterior tibial artery. 7. Angioplasty of the posterior tibial artery to 2.5 mm. Interventional Radiologist:  Criselda Peaches, MD  ANESTHESIA/SEDATION: Moderate (conscious) sedation was used. 1 mg Versed, 225 mcg Fentanyl were administered intravenously. The patient's vital signs were monitored continuously by radiology nursing throughout the procedure.  Sedation Time: 250 minutes  MEDICATIONS: Throughout the course of the procedure, nitroglycerin, heparin and 2 mg of TPA were administered. Additionally, 2 g Ancef were administered intravenously.  FLUOROSCOPY TIME:  66 min 42 seconds  643.7 mGy  CONTRAST:  158mL VISIPAQUE IODIXANOL 320 MG/ML IV SOLN  TECHNIQUE: Informed consent was obtained from the patient following explanation of the procedure, risks, benefits and alternatives. The patient understands, agrees and consents for the procedure. All questions were addressed. A time out was performed.  Maximal barrier sterile technique utilized including caps, mask, sterile gowns, sterile gloves, large sterile drape, hand hygiene, and Betadine skin prep.  The left groin was interrogated with ultrasound. The common femoral artery was found to be widely patent. Local anesthesia was attained by infiltration with 1% lidocaine. A small dermatotomy was made. Under real-time sonographic guidance, the left common femoral artery was punctured with a 21 gauge micropuncture needle. With the assistance of a 4 [place] transitional micro  sheath, the micro wire was exchanged for a Bentson wire which was advanced in the abdominal aorta. Over this, a working 4 [place] vascular sheath was advanced of the common femoral artery. A 4 French pigtail catheter was then advanced into the distal aorta and pelvic aortography was performed. The distal aorta is widely patent. The bilateral iliac arterial systems are widely patent although mildly tortuous. No significant stenosis.  Using a 4 French glide catheter and glidewire, the catheter was advanced often over and into the common femoral artery. The Bentson wire was advanced into the superficial femoral artery. A cook flexor 4 French vascular sheath was then advanced up- [place] the aortic bifurcation and into the superficial femoral artery. A right lower extremity runoff arteriogram was then performed.  Inflow: Widely patent bilateral iliac systems and common femoral arteries.  Outflow: Widely patent right superficial femoral and popliteal arteries.  Runoff: The right anterior tibial artery completely occludes approximately 2 cm beyond the origin. There is a hyper trophic collateral vessel. In the distal lower leg there appears to be faint recanalization of the anterior tibial artery. The dorsalis pedis artery is the red light at the level of the ankle. The tibioperoneal trunk is widely patent. The peroneal artery becomes markedly attenuated approximately 2 cm beyond the origin. The vessel continues into the mid calf but is very small. The posterior tibial artery demonstrates multifocal disease. There is a short segment high-grade stenosis in the proximal third of the artery and a second short segment high-grade stenosis in the mid third of the artery. Distally, there are tandem moderate stenoses in the distal third of the artery. Additionally, there is focal stenosis in artery as it is passes posterior to the medial malleolus. On the lateral view, there is very little flow to the forefoot.  The Bentson wire  was advanced into the popliteal artery and the 90 cm 4 French flexor sheath was advanced of the popliteal artery. Given the planned intervention, the patient was heparinized with a weight based dose of an initial 4000 units of heparin.  ACTs were then drawn and additional heparinization was administered as needed to maintain anticoagulation. Using a 2.6 [place] CXI support catheter and Whisper Wire attempts were made to recanalized the right anterior tibial artery. This CXR catheter was advanced into the anterior tibial artery and arteriography was performed. Ultimately, I was only able to progress into the large hypertrophic collateral rather than the true anterior tibial lumen. Consideration was made towards pedal puncture for retrograde approach. However, the patient did not respond well to the initial dose of the Versed and fentanyl for conscious sedation. Her dementia was unmasked and she became agitated and combative. Although we were able to calm her down enough to continue with the procedure, she continued to move her lower extremities randomly. Ultrasound interrogation of the dorsalis pedis demonstrates a very small and calcified vessel. Pedal access is not possible given her current status.  Therefore, I elected to provide optimal revascularization of the posterior tibial artery in the hopes that this will provide additional flow through the plantar arch and into the forefoot.  Therefore, the CX site catheter in was for wire were advanced into the posterior tibial artery. Arteriography demonstrate did almost no flow beyond the initial stenosis which was an interval change compared to the initial angiogram. Therefore, this was clip quickly crossed with a wire. Nitroglycerin was administered intra-arterially. The stenosis was angioplastied to 2 mm using a 2 x 200 mm are mild a balloon. Followup angiography confirmed restoration of flow. Therefore, pleural arteriography of the posterior tibial artery was  performed confirming a findings as described above.  The was per wire was advanced to the level of the medial malleolus. Unfortunately, I could not get the wire around the tight stenosis at the malleolus. The calcified stenoses throughout the posterior tibial artery with an after rectum eyes using a the [facility] CSI orbital atherectomy device with the 1.25 mm micro Crown. Atherectomy was performed only on the low setting. Following atherectomy, the posterior tibial artery was angioplastied first to 2 mm, and then to 2.5 mm using first a 2 x 150 and then a 2.5 x 150 mm sterling balloon. Inflations were held at 4 atmospheres for 2 min. Followup arteriography demonstrates significantly improved patency throughout the posterior tibial artery. Flow is brisk. There is residual significant stenosis at the medial malleolus in the on treated portion of the vessel. Flow remains patent in the medial and lateral calcaneal branches as well as in the proximal and distal arch is. Flow has improved compared to the pre intervention angiogram.  A final ACT was drawn. The catheters and wires were removed. The sheath was carefully brought back into the left external iliac artery and a limited left common femoral arteriogram was performed confirming access in the common femoral artery. Hemostasis was attained with the assistance of a 5 French Mynx Grip closure device.  COMPLICATIONS: None  IMPRESSION: 1. Right lower extremity runoff demonstrates widely patent inflow and outflow. 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 The plantar arch remains intact. 2. Unsuccessful attempt at recanalization of the chronically occluded anterior tibial artery. Unfortunately, due to patient agitation and motion, pedal access for retrograde recanalization could not be performed. 3. Successful atherectomy and  angioplasty of the diseased posterior tibial artery to 2.5 mm with significantly improved flow into the foot and plantar arch.  PLAN: 1. Bed rest for 4 hr prior to discharge home. 2.  Resume Coumadin tomorrow. 3. Return to clinic in 2 weeks for repeat ABI and PVRs as well as wound check. 4. Will see patient every 6 weeks thereafter until wound has healed. 5. If the wound does not heal, could consider a second attempt at recanalization of the anterior tibial artery although a a different sedation strategy would have to be employed. I would discuss the risks and benefits of a second attempt given the patient's underlying dementia and confusion with benzodiazepines at length with her daughter. Signed,  Criselda Peaches, MD  [facility]  [facility]   Electronically Signed   By: Jacqulynn Cadet M.D.   On: 05/05/2014 18:00   Ir US Guide Vasc Access Left  05/05/2014   CLINICAL DATA:  81 year old female with Rutherford stage 5 critical limb ischemia involving the right forefoot and great toe in the distribution of the anterior tibial artery. Has a pacemaker and cannot undergo MRI angiography. She presents for diagnostic angiogram and possible intervention to revascularize the anterior tibial angio zone. Her Coumadin has been held for the past several days. Her INR is 1.6 today.  EXAM: RIGHT EXTREMITY ARTERIOGRAPHY; IR TIB-PERO ART ATHEREC INC PTA; IR ULTRASOUND GUIDANCE VASC ACCESS LEFT  Date: 05/05/2014  PROCEDURE: 1. Ultrasound-guided puncture of the left common femoral artery. 2. Catheterization of the distal abdominal aorta with a distal abdominal aortic and pelvic arteriography. 3. Often over catheterization of the right common femoral artery. Right lower extremity runoff arteriogram. 4. Catheterization of the anterior tibial artery with arteriogram 5. Attempted recanalization of the chronically occluded right anterior tibial artery. 6. Catheterization of the  posterior tibial artery. Atherectomy of the posterior tibial artery. 7. Angioplasty of the posterior tibial artery to 2.5 mm. Interventional Radiologist:  Criselda Peaches, MD  ANESTHESIA/SEDATION: Moderate (conscious) sedation was used. 1 mg Versed, 225 mcg Fentanyl were administered intravenously. The patient's vital signs were monitored continuously by radiology nursing throughout the procedure.  Sedation Time: 250 minutes  MEDICATIONS: Throughout the course of the procedure, nitroglycerin, heparin and 2 mg of TPA were administered. Additionally, 2 g Ancef were administered intravenously.  FLUOROSCOPY TIME:  66 min 42 seconds  643.7 mGy  CONTRAST:  1106mL VISIPAQUE IODIXANOL 320 MG/ML IV SOLN  TECHNIQUE: Informed consent was obtained from the patient following explanation of the procedure, risks, benefits and alternatives. The patient understands, agrees and consents for the procedure. All questions were addressed. A time out was performed.  Maximal barrier sterile technique utilized including caps, mask, sterile gowns, sterile gloves, large sterile drape, hand hygiene, and Betadine skin prep.  The left groin was interrogated with ultrasound. The common femoral artery was found to be widely patent. Local anesthesia was attained by infiltration with 1% lidocaine. A small dermatotomy was made. Under real-time sonographic guidance, the left common femoral artery was punctured with a 21 gauge micropuncture needle. With the assistance of a 4 [place] transitional micro sheath, the micro wire was exchanged for a Bentson wire which was advanced in the abdominal aorta. Over this, a working 4 [place] vascular sheath was advanced of the common femoral artery. A 4 French pigtail catheter was then advanced into the distal aorta and pelvic aortography was performed. The distal aorta is widely patent. The bilateral iliac arterial systems are widely patent although mildly tortuous. No significant stenosis.  Using a 4 French  glide catheter and glidewire, the catheter was advanced often over and into the common femoral artery. The Bentson wire was advanced into the  superficial femoral artery. A cook flexor 4 French vascular sheath was then advanced up- [place] the aortic bifurcation and into the superficial femoral artery. A right lower extremity runoff arteriogram was then performed.  Inflow: Widely patent bilateral iliac systems and common femoral arteries.  Outflow: Widely patent right superficial femoral and popliteal arteries.  Runoff: The right anterior tibial artery completely occludes approximately 2 cm beyond the origin. There is a hyper trophic collateral vessel. In the distal lower leg there appears to be faint recanalization of the anterior tibial artery. The dorsalis pedis artery is the red light at the level of the ankle. The tibioperoneal trunk is widely patent. The peroneal artery becomes markedly attenuated approximately 2 cm beyond the origin. The vessel continues into the mid calf but is very small. The posterior tibial artery demonstrates multifocal disease. There is a short segment high-grade stenosis in the proximal third of the artery and a second short segment high-grade stenosis in the mid third of the artery. Distally, there are tandem moderate stenoses in the distal third of the artery. Additionally, there is focal stenosis in artery as it is passes posterior to the medial malleolus. On the lateral view, there is very little flow to the forefoot.  The Bentson wire was advanced into the popliteal artery and the 90 cm 4 French flexor sheath was advanced of the popliteal artery. Given the planned intervention, the patient was heparinized with a weight based dose of an initial 4000 units of heparin. ACTs were then drawn and additional heparinization was administered as needed to maintain anticoagulation. Using a 2.6 [place] CXI support catheter and Whisper Wire attempts were made to recanalized the right anterior  tibial artery. This CXR catheter was advanced into the anterior tibial artery and arteriography was performed. Ultimately, I was only able to progress into the large hypertrophic collateral rather than the true anterior tibial lumen. Consideration was made towards pedal puncture for retrograde approach. However, the patient did not respond well to the initial dose of the Versed and fentanyl for conscious sedation. Her dementia was unmasked and she became agitated and combative. Although we were able to calm her down enough to continue with the procedure, she continued to move her lower extremities randomly. Ultrasound interrogation of the dorsalis pedis demonstrates a very small and calcified vessel. Pedal access is not possible given her current status.  Therefore, I elected to provide optimal revascularization of the posterior tibial artery in the hopes that this will provide additional flow through the plantar arch and into the forefoot.  Therefore, the CX site catheter in was for wire were advanced into the posterior tibial artery. Arteriography demonstrate did almost no flow beyond the initial stenosis which was an interval change compared to the initial angiogram. Therefore, this was clip quickly crossed with a wire. Nitroglycerin was administered intra-arterially. The stenosis was angioplastied to 2 mm using a 2 x 200 mm are mild a balloon. Followup angiography confirmed restoration of flow. Therefore, pleural arteriography of the posterior tibial artery was performed confirming a findings as described above.  The was per wire was advanced to the level of the medial malleolus. Unfortunately, I could not get the wire around the tight stenosis at the malleolus. The calcified stenoses throughout the posterior tibial artery with an after rectum eyes using a the [facility] CSI orbital atherectomy device with the 1.25 mm micro Crown. Atherectomy was performed only on the low setting. Following atherectomy, the  posterior tibial artery was angioplastied first to 2 mm,  and then to 2.5 mm using first a 2 x 150 and then a 2.5 x 150 mm sterling balloon. Inflations were held at 4 atmospheres for 2 min. Followup arteriography demonstrates significantly improved patency throughout the posterior tibial artery. Flow is brisk. There is residual significant stenosis at the medial malleolus in the on treated portion of the vessel. Flow remains patent in the medial and lateral calcaneal branches as well as in the proximal and distal arch is. Flow has improved compared to the pre intervention angiogram.  A final ACT was drawn. The catheters and wires were removed. The sheath was carefully brought back into the left external iliac artery and a limited left common femoral arteriogram was performed confirming access in the common femoral artery. Hemostasis was attained with the assistance of a 5 French Mynx Grip closure device.  COMPLICATIONS: None  IMPRESSION: 1. Right lower extremity runoff demonstrates widely patent inflow and outflow. 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 The plantar arch remains intact. 2. Unsuccessful attempt at recanalization of the chronically occluded anterior tibial artery. Unfortunately, due to patient agitation and motion, pedal access for retrograde recanalization could not be performed. 3. Successful atherectomy and angioplasty of the diseased posterior tibial artery to 2.5 mm with significantly improved flow into the foot and plantar arch.  PLAN: 1. Bed rest for 4 hr prior to discharge home. 2. Resume Coumadin tomorrow. 3. Return to clinic in 2 weeks for repeat ABI and PVRs as well as wound check. 4. Will see patient every 6 weeks thereafter until wound has healed. 5. If the wound does not heal, could consider a second attempt at recanalization of the anterior  tibial artery although a a different sedation strategy would have to be employed. I would discuss the risks and benefits of a second attempt given the patient's underlying dementia and confusion with benzodiazepines at length with her daughter. Signed,  Criselda Peaches, MD  [facility]  [facility]   Electronically Signed   By: Jacqulynn Cadet M.D.   On: 05/05/2014 18:00    Labs:  CBC:  Recent Labs  04/26/14 0832 05/04/14 0800 05/04/14 2127 05/04/14 2156 05/06/14 0500  WBC 8.2 8.9  --  8.4 6.3  HGB 11.5* 10.4* 10.2* 9.5* 8.5*  HCT 35.2* 32.0* 30.0* 28.8* 26.3*  PLT 142* 188  --  159 152    COAGS:  Recent Labs  04/26/14 0832 05/04/14 0800 05/05/14 0514 05/06/14 0406  INR 2.02* 1.65* 1.54* 1.45  1.56*  APTT 41* 39*  --   --     BMP:  Recent Labs  04/26/14 0832 05/04/14 0800 05/04/14 2127 05/04/14 2156 05/06/14 0406  NA 141 140 142 142 146*  K 3.8 4.0 4.0 4.6 3.4*  CL 104 101 107 107 113*  CO2 26 29  --  27 24  GLUCOSE 123* 180* 152* 149* 85  BUN 29* 21 23 21 19   CALCIUM 9.3 9.0  --  8.7 8.5  CREATININE 1.30* 1.15* 1.10 1.21* 1.05  GFRNONAA 35* 40*  --  38* 45*  GFRAA 40* 47*  --  44* 52*    LIVER FUNCTION TESTS:  Recent Labs  05/04/14 2156  BILITOT 1.5*  AST 60*  ALT 23  ALKPHOS 69  PROT 6.4  ALBUMIN 2.8*    TUMOR MARKERS: No results for input(s): AFPTM, CEA, CA199, CHROMGRNA in  the last 8760 hours.  Assessment and Plan:  81 yo female with probable post-procedural stroke following RLE revascularization for critical limb ischemia.  Her groin access site looks good and her foot remains warm without evidence of increased ischemia.   - Etiology of stroke unclear and initial diagnosis obscured by agitation and confusion related to moderate sedation.  Perhaps due to atrial thrombus while off coumadin vs. Procedure related complication. - IR will continue to follow while in house - If patient  remains an in-pt, recommend wound care consult to continue wound care of right great toe ischemic tissue loss - Greatly appreciate excellent care of admitting team and neurology.   Signed,  Criselda Peaches, MD   05/06/2014, 8:19 AM

## 2014-05-06 NOTE — Progress Notes (Signed)
ANTICOAGULATION CONSULT NOTE - Follow Up Consult  Pharmacy Consult for Heparin  Indication: atrial fibrillation  No Known Allergies  Patient Measurements: Height: 5\' 2"  (157.5 cm) Weight: 121 lb 7.6 oz (55.1 kg) IBW/kg (Calculated) : 50.1  Vital Signs: Temp: 98 F (36.7 C) (01/22 0511) Temp Source: Axillary (01/22 0511) BP: 120/64 mmHg (01/22 0511) Pulse Rate: 78 (01/22 0511)  Labs:  Recent Labs  05/04/14 0800 05/04/14 2127 05/04/14 2156 05/05/14 0514 05/06/14 0406 05/06/14 0500  HGB 10.4* 10.2* 9.5*  --   --  8.5*  HCT 32.0* 30.0* 28.8*  --   --  26.3*  PLT 188  --  159  --   --  152  APTT 39*  --   --   --   --   --   LABPROT 19.7*  --   --  18.7* 18.8*  --   INR 1.65*  --   --  1.54* 1.56*  --   HEPARINUNFRC  --   --   --   --  <0.10*  --   CREATININE 1.15* 1.10 1.21*  --  1.05  --    Assessment: Sub-therapeutic heparin level x 1, no issues per RN.   Goal of Therapy:  Heparin level 0.3-0.7 units/ml Monitor platelets by anticoagulation protocol: Yes   Plan:  -Increase heparin drip to 900 units/hr -1300 HL -Daily CBC/HL -Monitor for bleeding  Narda Bonds 05/06/2014,5:12 AM

## 2014-05-06 NOTE — Evaluation (Signed)
Speech Language Pathology Evaluation Patient Details Name: Judith Zuniga MRN: [id number] DOB: [birth year] Today's Date: 05/06/2014 Time: 9628-3662 SLP Time Calculation (min) (ACUTE ONLY): 10 min  Problem List:  Patient Active Problem List   Diagnosis Date Noted  . Delirium 05/05/2014  . CVA (cerebral infarction) 05/05/2014  . Ischemic cardiomyopathy 05/05/2014  . Chronic atrial fibrillation 05/05/2014  . Diabetes mellitus 05/05/2014  . Systolic heart failure 94/76/5465  . Chronic systolic heart failure   . DM (diabetes mellitus) type II uncontrolled, periph vascular disorder   . Orthostatic hypotension 07/28/2013  . Encounter for therapeutic drug monitoring 07/13/2013  . Multiple pulmonary nodules 12/13/2011  . Atherosclerosis of native arteries of the extremities with ulceration 07/09/2011  . Syncope 05/01/2011  . Diastolic CHF, chronic 03/54/6568  . Mitral regurgitation 10/08/2010  . Long term current use of anticoagulant 05/16/2010  . ATAXIA 11/14/2009  . CAROTID ARTERY DISEASE 10/17/2009  . AODM 04/09/2008  . Hyperlipidemia 04/09/2008  . Benign hypertensive heart disease with heart failure 04/09/2008  . CAD, ARTERY BYPASS GRAFT 04/09/2008  . Atrial fibrillation 04/09/2008  . PACEMAKER, CRT-St. Jude 04/09/2008   Past Medical History:  Past Medical History  Diagnosis Date  . Atrioventricular block, complete s/p AV ablation   . Pacemaker -CRT- St Judes   . Type II or unspecified type diabetes mellitus without mention of complication, not stated as uncontrolled   . CAD (coronary artery disease)     s/p redo bypass surgery in 1998 with patent graft in Jan. 2009.   . Ischemic cardiomyopathy     ischemic heart myopathy , ejection fraction of 25%  . Systolic heart failure     class II and euvolemic  . Permanent atrial fibrillation   . Hyperlipidemia   . Diabetes mellitus   . Premature ventricular contractions     frequent  . Elevated liver function tests     on  amiodarone  . Hypertension   . Dementia    Past Surgical History:  Past Surgical History  Procedure Laterality Date  . Coronary artery bypass graft  1998    CABG x5: RIMA to diagonal one, RIMA to diagonal 2, SVG to PD, SVG to PL, SVG to circ  . Coronary artery bypass graft  1985    x3: LIMA to LAD, SVG to PD and SVG to PL  . Cardiac caths      multile  . Pacer generator change out      St. Jude pacemaker   HPI:  Patient is a 81 y/o female who is s/p angiography and revascularization of her RLE on 05/04/14. During the procedure the pt experienced agitation and confusion immediately following administration of the initial dose of conscious sedation and the procedure was completed with some difficulty. She did return to the ER later that evening / early the next morning with worsening aphasia, right facial droop and slurring. Ct head- (-). PMH of dementia, on chronic coumadin (this was held for five days recently for procedure on 05/04/2014--recent INR on 1/20 1.54), pacemker, CAD, DM.    Assessment / Plan / Recommendation Clinical Impression  Although no family is present to determine baseline level of function, pt was reportedly living alone PTA and is not safe to do so at this time. Pt has moderate-severe cognitive deficits including disorientation x4, decreased sustained attention, awareness, and basic problem solving. Basic comprehension is a relative strength, however her accuracy quickly drops with mild increase in complexity of task of conversation. Intermittent anomia is noted  in spontaneous speech. Pt will benefit from skilled SLP services and SNF upon d/c to maximize functional independence.     SLP Assessment  Patient needs continued Speech Lanaguage Pathology Services    Follow Up Recommendations  Skilled Nursing facility;24 hour supervision/assistance    Frequency and Duration min 2x/week  2 weeks   Pertinent Vitals/Pain Pain Assessment: No/denies pain Pain Score: 10-Worst  pain ever Pain Location: distal RLE foot Pain Descriptors / Indicators: Burning Pain Intervention(s): Limited activity within patient's tolerance;Monitored during session;Repositioned;Patient requesting pain meds-RN notified   SLP Goals  Patient/Family Stated Goal: none stated Potential to Achieve Goals (ACUTE ONLY): Fair Potential Considerations (ACUTE ONLY): Previous level of function  SLP Evaluation Prior Functioning  Cognitive/Linguistic Baseline: Baseline deficits Baseline deficit details: pt with h/o dementia but without family present to determine baseline level of function - per RN she lived at home alone PTA Type of Home: House  Lives With: Alone Available Help at Discharge: Family;Available PRN/intermittently   Cognition  Overall Cognitive Status: No family/caregiver present to determine baseline cognitive functioning Arousal/Alertness: Awake/alert Orientation Level: Disoriented X4 (pt states name but does not know DOB) Attention: Sustained Sustained Attention: Impaired Sustained Attention Impairment: Verbal basic;Functional basic Memory: Impaired Memory Impairment: Retrieval deficit;Decreased recall of new information Awareness: Impaired Awareness Impairment: Intellectual impairment;Emergent impairment;Anticipatory impairment Problem Solving: Impaired Problem Solving Impairment: Verbal basic;Functional basic Safety/Judgment: Impaired    Comprehension  Auditory Comprehension Overall Auditory Comprehension: Impaired Yes/No Questions: Impaired Complex Questions: 25-49% accurate Commands: Impaired One Step Basic Commands: 75-100% accurate Conversation: Simple Interfering Components: Attention;Processing speed;Working Field seismologist: [employer] Comprehension Reading Status: Not tested    Expression Expression Primary Mode of Expression: Verbal Verbal Expression Overall Verbal Expression: Impaired Initiation: No  impairment Automatic Speech: Name;Social Response Level of Generative/Spontaneous Verbalization: Sentence Naming: Impairment Confrontation: Within functional limits Other Naming Comments: anomia in conversation Interfering Components: Attention Written Expression Dominant Hand: Right Written Expression: Not tested   Oral / Motor Oral Motor/Sensory Function Overall Oral Motor/Sensory Function: Appears within functional limits for tasks assessed (mild, generalized weakness) Motor Speech Overall Motor Speech: Appears within functional limits for tasks assessed   GO     [name], M.A. CCC-SLP [phone]  [name] 05/06/2014, 1:30 PM

## 2014-05-06 NOTE — Clinical Social Work Psychosocial (Signed)
Clinical Social Work Department BRIEF PSYCHOSOCIAL ASSESSMENT 05/06/2014  Patient:  Judith Zuniga, Judith Zuniga     Account Number:  [id number]     Admit date:  05/04/2014  Clinical Social Worker:  Marciano Sequin  Date/Time:  05/06/2014 12:47 PM  Referred by:  RN  Date Referred:  05/06/2014 Referred for  SNF Placement   Other Referral:   Interview type:  Family Other interview type:   Pt is oriented to self only; Pt's daughter [name] [phone]    PSYCHOSOCIAL DATA Living Status:  ALONE Admitted from facility:   Level of care:   Primary support name:  [name] Primary support relationship to patient:  CHILD, ADULT Degree of support available:   Strong Support    CURRENT CONCERNS Current Concerns  None Noted   Other Concerns:    SOCIAL WORK ASSESSMENT / PLAN CSW spoke with the pt's daughter's [name]. CSW introduced self and purpose of the call. CSW and [name] clinical recommendations discussed rehab. CSW explained the SNF process to [place].  CSW emailed [name] a SNF list. CSW answered all questions in which the [place] inquired about. CSW provided the [place] with contact information for further questions. CSW will continue to follow this pt and assist with discharge as needed.   Assessment/plan status:  Psychosocial Support/Ongoing Assessment of Needs Other assessment/ plan:   Information/referral to community resources:    PATIENT'S/FAMILY'S RESPONSE TO PLAN OF CARE: [name] expressed wanting the pt to receive rehab before returning home. [name] was receptive and agree with the pt's plan of care.   [place], MSW, [employer]

## 2014-05-06 NOTE — Progress Notes (Signed)
Paged and left a message regarding patient's wound consult.

## 2014-05-06 NOTE — Progress Notes (Signed)
Pt unable to void. Bladder scan showed 779ml. Received order to in and out cath. Took out 630ml. Bladder scan redone to find 17 ml residual.

## 2014-05-06 NOTE — Evaluation (Signed)
Physical Therapy Evaluation Patient Details Name: Judith Zuniga MRN: [id number] DOB: Nov 14, 1920 Today's Date: 05/06/2014   History of Present Illness  Patient is a 81 y/o female who is s/p angiography and revascularization of her RLE on 05/04/14. During the procedure the pt experienced agitation and confusion immediately following administration of the initial dose of conscious sedation and the procedure was completed with some difficulty. She did return to the ER later that evening / early the next morning with worsening aphasia, right facial droop and slurring. Ct head- (-). PMH of dementia, on chronic coumadin (this was held for five days recently for procedure on 05/04/2014--recent INR on 1/20 1.54), pacemker, CAD, DM.  Clinical Impression  Patient presents with functional limitations due to deficits listed in PT problem list (see below). Pt with generalized weakness, pain in RLE, cognitive deficits (hx of dementia) and balance deficits impacting safe mobility. Pt mainly limited by pain in RLE limiting safe ambulation and distance today. Required assist with standing and gait. Not safe to be home alone. Pt would benefit from skilled PT to improve transfers, gait, balance and mobility so pt can maximize independence and return to PLOF.    Follow Up Recommendations SNF;Supervision/Assistance - 24 hour    Equipment Recommendations  None recommended by PT    Recommendations for Other Services       Precautions / Restrictions Precautions Precautions: Fall Restrictions Weight Bearing Restrictions: No Other Position/Activity Restrictions: No WB restrictions in chart relating to RLE s/p procedure.      Mobility  Bed Mobility Overal bed mobility: Needs Assistance Bed Mobility: Supine to Sit     Supine to sit: Min assist;HOB elevated     General bed mobility comments: Use of rails. Increased effort. Min A to bring bottom to EOB.   Transfers Overall transfer level: Needs  assistance Equipment used: Rolling walker (2 wheeled) Transfers: Sit to/from Stand Sit to Stand: Mod assist         General transfer comment: Mod A to rise from EOB with cues for anterior translation and hand placement. Increased pain in RLE with WB.  Transferred to chair.  Ambulation/Gait Ambulation/Gait assistance: Min assist Ambulation Distance (Feet): 7 Feet Assistive device: Rolling walker (2 wheeled) Gait Pattern/deviations: Step-to pattern;Decreased stride length;Decreased stance time - right;Decreased weight shift to right;Trunk flexed Gait velocity: slow   General Gait Details: Pt with slow, unsteady gait with difficulty placing weight through RLE secondary to pain. Min A for balance and RW negotiation.  Stairs            Wheelchair Mobility    Modified Rankin (Stroke Patients Only)       Balance Overall balance assessment: Needs assistance Sitting-balance support: Feet supported;No upper extremity supported Sitting balance-Leahy Scale: Fair     Standing balance support: During functional activity Standing balance-Leahy Scale: Poor Standing balance comment: Requires BUE support secondary to pain with WB through RLE.                             Pertinent Vitals/Pain Pain Assessment: Faces Faces Pain Scale: Hurts even more Pain Location: distal RLE Pain Descriptors / Indicators: Burning;Aching;Sore;Grimacing Pain Intervention(s): Limited activity within patient's tolerance;Monitored during session;Repositioned;Patient requesting pain meds-RN notified    Home Living Family/patient expects to be discharged to:: Skilled nursing facility Living Arrangements: Alone Available Help at Discharge: Family;Available PRN/intermittently Type of Home: House Home Access: Level entry     Home Layout: One level Home  Equipment: Gilford Rile - 2 wheels;Cane - single point      Prior Function Level of Independence: Independent         Comments: Reports  being (I) for ADLs, drives. This info agrees with what is in MD chart per daughter report that pt lives alone and does all own ADLs. Reports not using AD PTA?     Hand Dominance   Dominant Hand: Right    Extremity/Trunk Assessment   Upper Extremity Assessment: Defer to OT evaluation           Lower Extremity Assessment: RLE deficits/detail;Generalized weakness;LLE deficits/detail RLE Deficits / Details: Erythema, redness and warmth present distal RLE. Wrap around toes s/p vascular procedure 1/20. Limited ankle AROM and hip AROM secondary to pain. Able to perform LAQ and place weight through RLE with pain.       Communication   Communication: HOH  Cognition Arousal/Alertness: Awake/alert Behavior During Therapy: WFL for tasks assessed/performed Overall Cognitive Status: No family/caregiver present to determine baseline cognitive functioning (A&O x2. "Why am I here?")                      General Comments General comments (skin integrity, edema, etc.): Education provided on importance of elevation of RLE to assist with pain control.    Exercises        Assessment/Plan    PT Assessment Patient needs continued PT services  PT Diagnosis Difficulty walking;Generalized weakness;Acute pain   PT Problem List Decreased strength;Pain;Decreased range of motion;Decreased cognition;Impaired sensation;Decreased balance;Decreased mobility;Decreased safety awareness;Decreased activity tolerance  PT Treatment Interventions Balance training;Gait training;Patient/family education;Functional mobility training;Therapeutic activities;Therapeutic exercise   PT Goals (Current goals can be found in the Care Plan section) Acute Rehab PT Goals Patient Stated Goal: "when can I go home?" PT Goal Formulation: With patient Time For Goal Achievement: 05/20/14 Potential to Achieve Goals: Fair    Frequency Min 3X/week   Barriers to discharge Decreased caregiver support Pt lives alone.     Co-evaluation               End of Session Equipment Utilized During Treatment: Gait belt Activity Tolerance: Patient limited by pain Patient left: in chair;with call bell/phone within reach;with chair alarm set Nurse Communication: Mobility status;Patient requests pain meds    Functional Assessment Tool Used: Clinical judgment Functional Limitation: Mobility: Walking and moving around Mobility: Walking and Moving Around Current Status [phone]): At least 40 percent but less than 60 percent impaired, limited or restricted Mobility: Walking and Moving Around Goal Status [phone]): At least 20 percent but less than 40 percent impaired, limited or restricted    Time: 0830-0847 PT Time Calculation (min) (ACUTE ONLY): 17 min   Charges:   PT Evaluation $Initial PT Evaluation Tier I: 1 Procedure     PT G Codes:   PT G-Codes **NOT FOR INPATIENT CLASS** Functional Assessment Tool Used: Clinical judgment Functional Limitation: Mobility: Walking and moving around Mobility: Walking and Moving Around Current Status [phone]): At least 40 percent but less than 60 percent impaired, limited or restricted Mobility: Walking and Moving Around Goal Status [phone]): At least 20 percent but less than 40 percent impaired, limited or restricted    Candy Sledge A 05/06/2014, 9:03 AM Candy Sledge, PT, DPT [phone]

## 2014-05-06 NOTE — Progress Notes (Signed)
Patient Demographics  Judith Zuniga, is a 81 y.o. female, DOB - July 19, 1920, GGE:[id number]  Admit date - 05/04/2014   Admitting Physician Theressa Millard, MD  Outpatient Primary MD for the patient is Jani Gravel, MD  LOS - 2   Chief Complaint  Patient presents with  . Extremity Weakness  . Stroke Symptoms       Admission history of present illness: Judith Zuniga is a 81 y.o. female with a history of CAD/Ischemic Cardiomyopathy, Complete AV Block S/P St Jude Biventricular Pacemaker, Chronic Atrial Fibrillation on Coumadin Rx, Systolic CHF, DM2, HTN, PAD, and Dementia who was brought to the ED via EMS due to symptoms of Increased Confusion, Slurring of her speech and a right facial droop noticed by her daughter at 3 pm during the day following her having an Angiogram and angioplasty of the Rt Lower Extremity by Interventional Radiology in short stay for an ischemic right foot. The history is per the daughter who reports that her mother's words were not making sense, and her speech was slurred. At baseline she has mild dementia and is on Namenda Rx but lives alone and is able to perform all of her ADLs Agent had significant agitation on admission, where she was given IV Ativan, so initially patient could not be appropriately evaluated, CT head did not show any acute findings x2 , MRI could not be done secondary to pacemaker, today patient appears to be more awake, alert, appropriate, communicative.  Subjective:   Judith Zuniga had a good night sleep, with no significant events. Denies any chest pain, shortness of breath, headache, fever or chills. Assessment & Plan    Principal Problem:   CVA (cerebral infarction) Active Problems:   Hyperlipidemia   Benign hypertensive heart disease with heart failure   CAD, ARTERY BYPASS GRAFT   PACEMAKER, CRT-St. Jude   Long  term current use of anticoagulant   Diastolic CHF, chronic   Atherosclerosis of native arteries of the extremities with ulceration   Delirium   Ischemic cardiomyopathy   Chronic atrial fibrillation   Diabetes mellitus   Systolic heart failure   Chronic systolic heart failure   DM (diabetes mellitus) type II uncontrolled, periph vascular disorder  Acute encephalopathy, facial droop, slurred speech - Unclear if this is related to acute CVA or not, given unable to do MRI secondary to pacemaker, but this is most likely related to acute encephalopathy from medication and procedure. -  2-D echo EF of 55%, multiple valvular disease.,  - carotid Dopplers, - hemoglobin A1c is 6.3 -  LDL 63 - Continue with telemetry    Ischemic cardiomyopathy/CAD, ARTERY BYPASS GRAFT/Systolic CHF - On  Bisoprolol, Losartan, and Atrovastatin   Chronic atrial fibrillation -  back on warfarin as she is able to tolerate oral intake.  Atherosclerosis of native arteries of the extremities with ulceration - Vascular Procedure today by Interventional Radiology Dr. Laurence Ferrari Angiogram, and art done   Diastolic CHF, chronic - On  Bisoprolol, and Losartan    Hyperlipidemia -On Atorvastatin Rx   Benign hypertensive heart disease with heart failure - On Bisoprolol, Losartan,  Rx   PACEMAKER, CRT-St. Jude- due to Complete Heart Block   Hypokalemia - We'll replace, check an a.m.Marland Kitchen  DM2- -Hold Amaryl - SSI coverage PRN - Hemoglobin A1c 6.3      Code Status:Full  Family Communication: None at bedside  Disposition Plan: Remains inpatient   Procedures  None   Consults  Neurology   Medications  Scheduled Meds: . antiseptic oral rinse  7 mL Mouth Rinse q12n4p  . aspirin  300 mg Rectal Daily   Or  . aspirin  325 mg Oral Daily  . atorvastatin  40 mg Oral Daily  . bisoprolol  10 mg Oral Daily  . chlorhexidine  15 mL  Mouth Rinse BID  . dextrose  25 mL Intravenous Once  . insulin aspart  0-9 Units Subcutaneous 6 times per day  . losartan  50 mg Oral Daily  . memantine  28 mg Oral Daily  . potassium chloride  10 mEq Oral Daily   Continuous Infusions: . sodium chloride 75 mL/hr at 05/05/14 1819  . heparin 900 Units/hr (05/06/14 0534)   PRN Meds:.acetaminophen, acetaminophen, senna-docusate  DVT Prophylaxis  on warfarin  Lab Results  Component Value Date   PLT 152 05/06/2014    Antibiotics   Anti-infectives    None          Objective:   Filed Vitals:   05/06/14 0130 05/06/14 0511 05/06/14 0914 05/06/14 1309  BP: 136/58 120/64 150/54 156/51  Pulse: 74 78 76 73  Temp: 98.1 F (36.7 C) 98 F (36.7 C) 98.7 F (37.1 C) 98.2 F (36.8 C)  TempSrc: Axillary Axillary Oral Oral  Resp: 18 18 20 18   Height:      Weight:      SpO2: 98% 97% 97% 99%    Wt Readings from Last 3 Encounters:  05/05/14 55.1 kg (121 lb 7.6 oz)  04/26/14 56.7 kg (125 lb)  12/15/13 56.7 kg (125 lb)     Intake/Output Summary (Last 24 hours) at 05/06/14 1326 Last data filed at 05/06/14 0547  Gross per 24 hour  Intake      0 ml  Output    667 ml  Net   -667 ml     Physical Exam  Patient in deep sleep this a.m. after receiving IV Ativan, hard to arouse. [place]y.AT, Supple Neck,No JVD, No cervical lymphadenopathy appriciated.  Symmetrical Chest wall movement, Good air movement bilaterally, CTAB RRR,No Gallops,Rubs or new Murmurs, No Parasternal Heave +ve B.Sounds, Abd Soft, No tenderness, No organomegaly appriciated, No rebound - guarding or rigidity. No Cyanosis, Clubbing or edema, No new Rash or bruise  right leg dressing removed and patient having right lateral foot wound.   Data Review   Micro Results No results found for this or any previous visit (from the past 240 hour(s)).  Radiology Reports Ct Head Wo Contrast  05/05/2014   CLINICAL DATA:  Subsequent evaluation of stroke 24 hr after  administration of TPA  EXAM: CT HEAD WITHOUT CONTRAST  TECHNIQUE: Contiguous axial images were obtained from the base of the skull through the vertex without intravenous contrast.  COMPARISON:  05/04/2014  FINDINGS: There is no evidence of hemorrhage or extra-axial fluid. Again identified is moderate diffuse atrophy and moderate low attenuation diffusely in the deep white matter consistent with chronic small vessel ischemic change. No evidence of vascular territory infarct. No hydrocephalus or mass.  IMPRESSION: No significant change from yesterday's examination. No hemorrhage or extra-axial fluid.   Electronically Signed   By: Skipper Cliche M.D.   On: 05/05/2014 10:18   Ct Head Wo Contrast  05/04/2014   CLINICAL  DATA:  Right-sided facial droop and increased confusion.  EXAM: CT HEAD WITHOUT CONTRAST  TECHNIQUE: Contiguous axial images were obtained from the base of the skull through the vertex without intravenous contrast.  COMPARISON:  07/07/2011  FINDINGS: Ventricles are normal configuration. There is ventricular and sulcal enlargement reflecting age related volume loss. No hydrocephalus.  No parenchymal masses or mass effect.  There is no evidence of a recent cortical infarct.  Patchy white matter hypoattenuation noted consistent with mild chronic microvascular ischemic change, stable.  No extra-axial masses or abnormal fluid collections.  There is no intracranial hemorrhage.  Visualized sinuses and mastoid air cells are clear.  IMPRESSION: 1. No acute intracranial abnormalities. 2. Age related volume loss. Mild chronic microvascular ischemic change.   Electronically Signed   By: Lajean Manes M.D.   On: 05/04/2014 23:22   Ir Angiogram Extremity Right  05/05/2014   CLINICAL DATA:  81 year old female with Rutherford stage 5 critical limb ischemia involving the right forefoot and great toe in the distribution of the anterior tibial artery. Has a pacemaker and cannot undergo MRI angiography. She presents  for diagnostic angiogram and possible intervention to revascularize the anterior tibial angio zone. Her Coumadin has been held for the past several days. Her INR is 1.6 today.  EXAM: RIGHT EXTREMITY ARTERIOGRAPHY; IR TIB-PERO ART ATHEREC INC PTA; IR ULTRASOUND GUIDANCE VASC ACCESS LEFT  Date: 05/05/2014  PROCEDURE: 1. Ultrasound-guided puncture of the left common femoral artery. 2. Catheterization of the distal abdominal aorta with a distal abdominal aortic and pelvic arteriography. 3. Often over catheterization of the right common femoral artery. Right lower extremity runoff arteriogram. 4. Catheterization of the anterior tibial artery with arteriogram 5. Attempted recanalization of the chronically occluded right anterior tibial artery. 6. Catheterization of the posterior tibial artery. Atherectomy of the posterior tibial artery. 7. Angioplasty of the posterior tibial artery to 2.5 mm. Interventional Radiologist:  Criselda Peaches, MD  ANESTHESIA/SEDATION: Moderate (conscious) sedation was used. 1 mg Versed, 225 mcg Fentanyl were administered intravenously. The patient's vital signs were monitored continuously by radiology nursing throughout the procedure.  Sedation Time: 250 minutes  MEDICATIONS: Throughout the course of the procedure, nitroglycerin, heparin and 2 mg of TPA were administered. Additionally, 2 g Ancef were administered intravenously.  FLUOROSCOPY TIME:  66 min 42 seconds  643.7 mGy  CONTRAST:  135mL VISIPAQUE IODIXANOL 320 MG/ML IV SOLN  TECHNIQUE: Informed consent was obtained from the patient following explanation of the procedure, risks, benefits and alternatives. The patient understands, agrees and consents for the procedure. All questions were addressed. A time out was performed.  Maximal barrier sterile technique utilized including caps, mask, sterile gowns, sterile gloves, large sterile drape, hand hygiene, and Betadine skin prep.  The left groin was interrogated with ultrasound. The common  femoral artery was found to be widely patent. Local anesthesia was attained by infiltration with 1% lidocaine. A small dermatotomy was made. Under real-time sonographic guidance, the left common femoral artery was punctured with a 21 gauge micropuncture needle. With the assistance of a 4 [place] transitional micro sheath, the micro wire was exchanged for a Bentson wire which was advanced in the abdominal aorta. Over this, a working 4 [place] vascular sheath was advanced of the common femoral artery. A 4 French pigtail catheter was then advanced into the distal aorta and pelvic aortography was performed. The distal aorta is widely patent. The bilateral iliac arterial systems are widely patent although mildly tortuous. No significant stenosis.  Using a 4 [place]  glide catheter and glidewire, the catheter was advanced often over and into the common femoral artery. The Bentson wire was advanced into the superficial femoral artery. A cook flexor 4 French vascular sheath was then advanced up- [place] the aortic bifurcation and into the superficial femoral artery. A right lower extremity runoff arteriogram was then performed.  Inflow: Widely patent bilateral iliac systems and common femoral arteries.  Outflow: Widely patent right superficial femoral and popliteal arteries.  Runoff: The right anterior tibial artery completely occludes approximately 2 cm beyond the origin. There is a hyper trophic collateral vessel. In the distal lower leg there appears to be faint recanalization of the anterior tibial artery. The dorsalis pedis artery is the red light at the level of the ankle. The tibioperoneal trunk is widely patent. The peroneal artery becomes markedly attenuated approximately 2 cm beyond the origin. The vessel continues into the mid calf but is very small. The posterior tibial artery demonstrates multifocal disease. There is a short segment high-grade stenosis in the proximal third of the artery and a second short segment  high-grade stenosis in the mid third of the artery. Distally, there are tandem moderate stenoses in the distal third of the artery. Additionally, there is focal stenosis in artery as it is passes posterior to the medial malleolus. On the lateral view, there is very little flow to the forefoot.  The Bentson wire was advanced into the popliteal artery and the 90 cm 4 French flexor sheath was advanced of the popliteal artery. Given the planned intervention, the patient was heparinized with a weight based dose of an initial 4000 units of heparin. ACTs were then drawn and additional heparinization was administered as needed to maintain anticoagulation. Using a 2.6 [place] CXI support catheter and Whisper Wire attempts were made to recanalized the right anterior tibial artery. This CXR catheter was advanced into the anterior tibial artery and arteriography was performed. Ultimately, I was only able to progress into the large hypertrophic collateral rather than the true anterior tibial lumen. Consideration was made towards pedal puncture for retrograde approach. However, the patient did not respond well to the initial dose of the Versed and fentanyl for conscious sedation. Her dementia was unmasked and she became agitated and combative. Although we were able to calm her down enough to continue with the procedure, she continued to move her lower extremities randomly. Ultrasound interrogation of the dorsalis pedis demonstrates a very small and calcified vessel. Pedal access is not possible given her current status.  Therefore, I elected to provide optimal revascularization of the posterior tibial artery in the hopes that this will provide additional flow through the plantar arch and into the forefoot.  Therefore, the CX site catheter in was for wire were advanced into the posterior tibial artery. Arteriography demonstrate did almost no flow beyond the initial stenosis which was an interval change compared to the initial  angiogram. Therefore, this was clip quickly crossed with a wire. Nitroglycerin was administered intra-arterially. The stenosis was angioplastied to 2 mm using a 2 x 200 mm are mild a balloon. Followup angiography confirmed restoration of flow. Therefore, pleural arteriography of the posterior tibial artery was performed confirming a findings as described above.  The was per wire was advanced to the level of the medial malleolus. Unfortunately, I could not get the wire around the tight stenosis at the malleolus. The calcified stenoses throughout the posterior tibial artery with an after rectum eyes using a the [facility] CSI orbital atherectomy device with the 1.25  mm micro Crown. Atherectomy was performed only on the low setting. Following atherectomy, the posterior tibial artery was angioplastied first to 2 mm, and then to 2.5 mm using first a 2 x 150 and then a 2.5 x 150 mm sterling balloon. Inflations were held at 4 atmospheres for 2 min. Followup arteriography demonstrates significantly improved patency throughout the posterior tibial artery. Flow is brisk. There is residual significant stenosis at the medial malleolus in the on treated portion of the vessel. Flow remains patent in the medial and lateral calcaneal branches as well as in the proximal and distal arch is. Flow has improved compared to the pre intervention angiogram.  A final ACT was drawn. The catheters and wires were removed. The sheath was carefully brought back into the left external iliac artery and a limited left common femoral arteriogram was performed confirming access in the common femoral artery. Hemostasis was attained with the assistance of a 5 French Mynx Grip closure device.  COMPLICATIONS: None  IMPRESSION: 1. Right lower extremity runoff demonstrates widely patent inflow and outflow. There is extensive runoff disease including chronic long segment occlusion of the anterior tibial artery and multifocal high-grade and moderate  stenoses of the posterior tibial artery. The plantar arch remains intact. 2. Unsuccessful attempt at recanalization of the chronically occluded anterior tibial artery. Unfortunately, due to patient agitation and motion, pedal access for retrograde recanalization could not be performed. 3. Successful atherectomy and angioplasty of the diseased posterior tibial artery to 2.5 mm with significantly improved flow into the foot and plantar arch.  PLAN: 1. Bed rest for 4 hr prior to discharge home. 2. Resume Coumadin tomorrow. 3. Return to clinic in 2 weeks for repeat ABI and PVRs as well as wound check. 4. Will see patient every 6 weeks thereafter until wound has healed. 5. If the wound does not heal, could consider a second attempt at recanalization of the anterior tibial artery although a a different sedation strategy would have to be employed. I would discuss the risks and benefits of a second attempt given the patient's underlying dementia and confusion with benzodiazepines at length with her daughter. Signed,  Criselda Peaches, MD  [facility]  [facility] Radiology   Electronically Signed   By: Jacqulynn Cadet M.D.   On: 05/05/2014 18:00   [place] Pta Mod Sed  05/05/2014   CLINICAL DATA:  81 year old female with Rutherford stage 5 critical limb ischemia involving the right forefoot and great toe in the distribution of the anterior tibial artery. Has a pacemaker and cannot undergo MRI angiography. She presents for diagnostic angiogram and possible intervention to revascularize the anterior tibial angio zone. Her Coumadin has been held for the past several days. Her INR is 1.6 today.  EXAM: RIGHT EXTREMITY ARTERIOGRAPHY; IR TIB-PERO ART ATHEREC INC PTA; IR ULTRASOUND GUIDANCE VASC ACCESS LEFT  Date: 05/05/2014  PROCEDURE: 1. Ultrasound-guided puncture of the left common femoral artery. 2. Catheterization of the distal abdominal aorta with a distal  abdominal aortic and pelvic arteriography. 3. Often over catheterization of the right common femoral artery. Right lower extremity runoff arteriogram. 4. Catheterization of the anterior tibial artery with arteriogram 5. Attempted recanalization of the chronically occluded right anterior tibial artery. 6. Catheterization of the posterior tibial artery. Atherectomy of the posterior tibial artery. 7. Angioplasty of the posterior tibial artery to 2.5 mm. Interventional Radiologist:  Criselda Peaches, MD  ANESTHESIA/SEDATION: Moderate (conscious) sedation was used. 1 mg Versed, 225 mcg  Fentanyl were administered intravenously. The patient's vital signs were monitored continuously by radiology nursing throughout the procedure.  Sedation Time: 250 minutes  MEDICATIONS: Throughout the course of the procedure, nitroglycerin, heparin and 2 mg of TPA were administered. Additionally, 2 g Ancef were administered intravenously.  FLUOROSCOPY TIME:  66 min 42 seconds  643.7 mGy  CONTRAST:  197mL VISIPAQUE IODIXANOL 320 MG/ML IV SOLN  TECHNIQUE: Informed consent was obtained from the patient following explanation of the procedure, risks, benefits and alternatives. The patient understands, agrees and consents for the procedure. All questions were addressed. A time out was performed.  Maximal barrier sterile technique utilized including caps, mask, sterile gowns, sterile gloves, large sterile drape, hand hygiene, and Betadine skin prep.  The left groin was interrogated with ultrasound. The common femoral artery was found to be widely patent. Local anesthesia was attained by infiltration with 1% lidocaine. A small dermatotomy was made. Under real-time sonographic guidance, the left common femoral artery was punctured with a 21 gauge micropuncture needle. With the assistance of a 4 [place] transitional micro sheath, the micro wire was exchanged for a Bentson wire which was advanced in the abdominal aorta. Over this, a working 4 [place]  vascular sheath was advanced of the common femoral artery. A 4 French pigtail catheter was then advanced into the distal aorta and pelvic aortography was performed. The distal aorta is widely patent. The bilateral iliac arterial systems are widely patent although mildly tortuous. No significant stenosis.  Using a 4 French glide catheter and glidewire, the catheter was advanced often over and into the common femoral artery. The Bentson wire was advanced into the superficial femoral artery. A cook flexor 4 French vascular sheath was then advanced up- [place] the aortic bifurcation and into the superficial femoral artery. A right lower extremity runoff arteriogram was then performed.  Inflow: Widely patent bilateral iliac systems and common femoral arteries.  Outflow: Widely patent right superficial femoral and popliteal arteries.  Runoff: The right anterior tibial artery completely occludes approximately 2 cm beyond the origin. There is a hyper trophic collateral vessel. In the distal lower leg there appears to be faint recanalization of the anterior tibial artery. The dorsalis pedis artery is the red light at the level of the ankle. The tibioperoneal trunk is widely patent. The peroneal artery becomes markedly attenuated approximately 2 cm beyond the origin. The vessel continues into the mid calf but is very small. The posterior tibial artery demonstrates multifocal disease. There is a short segment high-grade stenosis in the proximal third of the artery and a second short segment high-grade stenosis in the mid third of the artery. Distally, there are tandem moderate stenoses in the distal third of the artery. Additionally, there is focal stenosis in artery as it is passes posterior to the medial malleolus. On the lateral view, there is very little flow to the forefoot.  The Bentson wire was advanced into the popliteal artery and the 90 cm 4 French flexor sheath was advanced of the popliteal artery. Given the planned  intervention, the patient was heparinized with a weight based dose of an initial 4000 units of heparin. ACTs were then drawn and additional heparinization was administered as needed to maintain anticoagulation. Using a 2.6 [place] CXI support catheter and Whisper Wire attempts were made to recanalized the right anterior tibial artery. This CXR catheter was advanced into the anterior tibial artery and arteriography was performed. Ultimately, I was only able to progress into the large hypertrophic collateral rather than the  true anterior tibial lumen. Consideration was made towards pedal puncture for retrograde approach. However, the patient did not respond well to the initial dose of the Versed and fentanyl for conscious sedation. Her dementia was unmasked and she became agitated and combative. Although we were able to calm her down enough to continue with the procedure, she continued to move her lower extremities randomly. Ultrasound interrogation of the dorsalis pedis demonstrates a very small and calcified vessel. Pedal access is not possible given her current status.  Therefore, I elected to provide optimal revascularization of the posterior tibial artery in the hopes that this will provide additional flow through the plantar arch and into the forefoot.  Therefore, the CX site catheter in was for wire were advanced into the posterior tibial artery. Arteriography demonstrate did almost no flow beyond the initial stenosis which was an interval change compared to the initial angiogram. Therefore, this was clip quickly crossed with a wire. Nitroglycerin was administered intra-arterially. The stenosis was angioplastied to 2 mm using a 2 x 200 mm are mild a balloon. Followup angiography confirmed restoration of flow. Therefore, pleural arteriography of the posterior tibial artery was performed confirming a findings as described above.  The was per wire was advanced to the level of the medial malleolus. Unfortunately, I  could not get the wire around the tight stenosis at the malleolus. The calcified stenoses throughout the posterior tibial artery with an after rectum eyes using a the [facility] CSI orbital atherectomy device with the 1.25 mm micro Crown. Atherectomy was performed only on the low setting. Following atherectomy, the posterior tibial artery was angioplastied first to 2 mm, and then to 2.5 mm using first a 2 x 150 and then a 2.5 x 150 mm sterling balloon. Inflations were held at 4 atmospheres for 2 min. Followup arteriography demonstrates significantly improved patency throughout the posterior tibial artery. Flow is brisk. There is residual significant stenosis at the medial malleolus in the on treated portion of the vessel. Flow remains patent in the medial and lateral calcaneal branches as well as in the proximal and distal arch is. Flow has improved compared to the pre intervention angiogram.  A final ACT was drawn. The catheters and wires were removed. The sheath was carefully brought back into the left external iliac artery and a limited left common femoral arteriogram was performed confirming access in the common femoral artery. Hemostasis was attained with the assistance of a 5 French Mynx Grip closure device.  COMPLICATIONS: None  IMPRESSION: 1. Right lower extremity runoff demonstrates widely patent inflow and outflow. 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 The plantar arch remains intact. 2. Unsuccessful attempt at recanalization of the chronically occluded anterior tibial artery. Unfortunately, due to patient agitation and motion, pedal access for retrograde recanalization could not be performed. 3. Successful atherectomy and angioplasty of the diseased posterior tibial artery to 2.5 mm with significantly improved flow into the foot and plantar arch.  PLAN: 1. Bed  rest for 4 hr prior to discharge home. 2. Resume Coumadin tomorrow. 3. Return to clinic in 2 weeks for repeat ABI and PVRs as well as wound check. 4. Will see patient every 6 weeks thereafter until wound has healed. 5. If the wound does not heal, could consider a second attempt at recanalization of the anterior tibial artery although a a different sedation strategy would have to be employed. I  would discuss the risks and benefits of a second attempt given the patient's underlying dementia and confusion with benzodiazepines at length with her daughter. Signed,  Criselda Peaches, MD  [facility]  [facility]   Electronically Signed   By: Jacqulynn Cadet M.D.   On: 05/05/2014 18:00   Ir US Guide Vasc Access Left  05/05/2014   CLINICAL DATA:  81 year old female with Rutherford stage 5 critical limb ischemia involving the right forefoot and great toe in the distribution of the anterior tibial artery. Has a pacemaker and cannot undergo MRI angiography. She presents for diagnostic angiogram and possible intervention to revascularize the anterior tibial angio zone. Her Coumadin has been held for the past several days. Her INR is 1.6 today.  EXAM: RIGHT EXTREMITY ARTERIOGRAPHY; IR TIB-PERO ART ATHEREC INC PTA; IR ULTRASOUND GUIDANCE VASC ACCESS LEFT  Date: 05/05/2014  PROCEDURE: 1. Ultrasound-guided puncture of the left common femoral artery. 2. Catheterization of the distal abdominal aorta with a distal abdominal aortic and pelvic arteriography. 3. Often over catheterization of the right common femoral artery. Right lower extremity runoff arteriogram. 4. Catheterization of the anterior tibial artery with arteriogram 5. Attempted recanalization of the chronically occluded right anterior tibial artery. 6. Catheterization of the posterior tibial artery. Atherectomy of the posterior tibial artery. 7. Angioplasty of the posterior tibial artery to 2.5 mm. Interventional  Radiologist:  Criselda Peaches, MD  ANESTHESIA/SEDATION: Moderate (conscious) sedation was used. 1 mg Versed, 225 mcg Fentanyl were administered intravenously. The patient's vital signs were monitored continuously by radiology nursing throughout the procedure.  Sedation Time: 250 minutes  MEDICATIONS: Throughout the course of the procedure, nitroglycerin, heparin and 2 mg of TPA were administered. Additionally, 2 g Ancef were administered intravenously.  FLUOROSCOPY TIME:  66 min 42 seconds  643.7 mGy  CONTRAST:  129mL VISIPAQUE IODIXANOL 320 MG/ML IV SOLN  TECHNIQUE: Informed consent was obtained from the patient following explanation of the procedure, risks, benefits and alternatives. The patient understands, agrees and consents for the procedure. All questions were addressed. A time out was performed.  Maximal barrier sterile technique utilized including caps, mask, sterile gowns, sterile gloves, large sterile drape, hand hygiene, and Betadine skin prep.  The left groin was interrogated with ultrasound. The common femoral artery was found to be widely patent. Local anesthesia was attained by infiltration with 1% lidocaine. A small dermatotomy was made. Under real-time sonographic guidance, the left common femoral artery was punctured with a 21 gauge micropuncture needle. With the assistance of a 4 [place] transitional micro sheath, the micro wire was exchanged for a Bentson wire which was advanced in the abdominal aorta. Over this, a working 4 [place] vascular sheath was advanced of the common femoral artery. A 4 French pigtail catheter was then advanced into the distal aorta and pelvic aortography was performed. The distal aorta is widely patent. The bilateral iliac arterial systems are widely patent although mildly tortuous. No significant stenosis.  Using a 4 French glide catheter and glidewire, the catheter was advanced often over and into the common femoral artery. The Bentson wire was advanced into the  superficial femoral artery. A cook flexor 4 French vascular sheath was then advanced up- [place] the aortic bifurcation and into the superficial femoral artery. A right lower extremity runoff arteriogram was then performed.  Inflow: Widely patent bilateral iliac systems and common femoral arteries.  Outflow: Widely patent right superficial femoral and popliteal arteries.  Runoff: The right anterior tibial artery completely occludes  approximately 2 cm beyond the origin. There is a hyper trophic collateral vessel. In the distal lower leg there appears to be faint recanalization of the anterior tibial artery. The dorsalis pedis artery is the red light at the level of the ankle. The tibioperoneal trunk is widely patent. The peroneal artery becomes markedly attenuated approximately 2 cm beyond the origin. The vessel continues into the mid calf but is very small. The posterior tibial artery demonstrates multifocal disease. There is a short segment high-grade stenosis in the proximal third of the artery and a second short segment high-grade stenosis in the mid third of the artery. Distally, there are tandem moderate stenoses in the distal third of the artery. Additionally, there is focal stenosis in artery as it is passes posterior to the medial malleolus. On the lateral view, there is very little flow to the forefoot.  The Bentson wire was advanced into the popliteal artery and the 90 cm 4 French flexor sheath was advanced of the popliteal artery. Given the planned intervention, the patient was heparinized with a weight based dose of an initial 4000 units of heparin. ACTs were then drawn and additional heparinization was administered as needed to maintain anticoagulation. Using a 2.6 [place] CXI support catheter and Whisper Wire attempts were made to recanalized the right anterior tibial artery. This CXR catheter was advanced into the anterior tibial artery and arteriography was performed. Ultimately, I was only able to  progress into the large hypertrophic collateral rather than the true anterior tibial lumen. Consideration was made towards pedal puncture for retrograde approach. However, the patient did not respond well to the initial dose of the Versed and fentanyl for conscious sedation. Her dementia was unmasked and she became agitated and combative. Although we were able to calm her down enough to continue with the procedure, she continued to move her lower extremities randomly. Ultrasound interrogation of the dorsalis pedis demonstrates a very small and calcified vessel. Pedal access is not possible given her current status.  Therefore, I elected to provide optimal revascularization of the posterior tibial artery in the hopes that this will provide additional flow through the plantar arch and into the forefoot.  Therefore, the CX site catheter in was for wire were advanced into the posterior tibial artery. Arteriography demonstrate did almost no flow beyond the initial stenosis which was an interval change compared to the initial angiogram. Therefore, this was clip quickly crossed with a wire. Nitroglycerin was administered intra-arterially. The stenosis was angioplastied to 2 mm using a 2 x 200 mm are mild a balloon. Followup angiography confirmed restoration of flow. Therefore, pleural arteriography of the posterior tibial artery was performed confirming a findings as described above.  The was per wire was advanced to the level of the medial malleolus. Unfortunately, I could not get the wire around the tight stenosis at the malleolus. The calcified stenoses throughout the posterior tibial artery with an after rectum eyes using a the [facility] CSI orbital atherectomy device with the 1.25 mm micro Crown. Atherectomy was performed only on the low setting. Following atherectomy, the posterior tibial artery was angioplastied first to 2 mm, and then to 2.5 mm using first a 2 x 150 and then a 2.5 x 150 mm sterling balloon.  Inflations were held at 4 atmospheres for 2 min. Followup arteriography demonstrates significantly improved patency throughout the posterior tibial artery. Flow is brisk. There is residual significant stenosis at the medial malleolus in the on treated portion of the vessel. Flow remains patent  in the medial and lateral calcaneal branches as well as in the proximal and distal arch is. Flow has improved compared to the pre intervention angiogram.  A final ACT was drawn. The catheters and wires were removed. The sheath was carefully brought back into the left external iliac artery and a limited left common femoral arteriogram was performed confirming access in the common femoral artery. Hemostasis was attained with the assistance of a 5 French Mynx Grip closure device.  COMPLICATIONS: None  IMPRESSION: 1. Right lower extremity runoff demonstrates widely patent inflow and outflow. 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 The plantar arch remains intact. 2. Unsuccessful attempt at recanalization of the chronically occluded anterior tibial artery. Unfortunately, due to patient agitation and motion, pedal access for retrograde recanalization could not be performed. 3. Successful atherectomy and angioplasty of the diseased posterior tibial artery to 2.5 mm with significantly improved flow into the foot and plantar arch.  PLAN: 1. Bed rest for 4 hr prior to discharge home. 2. Resume Coumadin tomorrow. 3. Return to clinic in 2 weeks for repeat ABI and PVRs as well as wound check. 4. Will see patient every 6 weeks thereafter until wound has healed. 5. If the wound does not heal, could consider a second attempt at recanalization of the anterior tibial artery although a a different sedation strategy would have to be employed. I would discuss the risks and benefits of a second attempt  given the patient's underlying dementia and confusion with benzodiazepines at length with her daughter. Signed,  Criselda Peaches, MD  [facility]  [facility]   Electronically Signed   By: Jacqulynn Cadet M.D.   On: 05/05/2014 18:00    CBC  Recent Labs Lab 05/04/14 0800 05/04/14 2127 05/04/14 2156 05/06/14 0500  WBC 8.9  --  8.4 6.3  HGB 10.4* 10.2* 9.5* 8.5*  HCT 32.0* 30.0* 28.8* 26.3*  PLT 188  --  159 152  MCV 92.8  --  93.2 93.3  MCH 30.1  --  30.7 30.1  MCHC 32.5  --  33.0 32.3  RDW 13.8  --  13.9 13.8  LYMPHSABS 0.8  --  0.7  --   MONOABS 0.6  --  0.6  --   EOSABS 0.0  --  0.0  --   BASOSABS 0.0  --  0.0  --     Chemistries   Recent Labs Lab 05/04/14 0800 05/04/14 2127 05/04/14 2156 05/06/14 0406  NA 140 142 142 146*  K 4.0 4.0 4.6 3.4*  CL 101 107 107 113*  CO2 29  --  27 24  GLUCOSE 180* 152* 149* 85  BUN 21 23 21 19   CREATININE 1.15* 1.10 1.21* 1.05  CALCIUM 9.0  --  8.7 8.5  AST  --   --  60*  --   ALT  --   --  23  --   ALKPHOS  --   --  69  --   BILITOT  --   --  1.5*  --    ------------------------------------------------------------------------------------------------------------------ estimated creatinine clearance is 27.6 mL/min (by C-G formula based on Cr of 1.05). ------------------------------------------------------------------------------------------------------------------  Recent Labs  05/05/14 0514  HGBA1C 6.3*   ------------------------------------------------------------------------------------------------------------------  Recent Labs  05/05/14 0514  CHOL 108  HDL 32*  LDLCALC 63  TRIG 63  CHOLHDL 3.4   ------------------------------------------------------------------------------------------------------------------ No results  for input(s): TSH, T4TOTAL, T3FREE, THYROIDAB in the last 72 hours.  Invalid input(s):  FREET3 ------------------------------------------------------------------------------------------------------------------ No results for input(s): VITAMINB12, FOLATE, FERRITIN, TIBC, IRON, RETICCTPCT in the last 72 hours.  Coagulation profile  Recent Labs Lab 05/04/14 0800 05/05/14 0514 05/06/14 0406  INR 1.65* 1.54* 1.45  1.56*    No results for input(s): DDIMER in the last 72 hours.  Cardiac Enzymes No results for input(s): CKMB, TROPONINI, MYOGLOBIN in the last 168 hours.  Invalid input(s): CK ------------------------------------------------------------------------------------------------------------------ Invalid input(s): POCBNP     Time Spent in minutes   25 minutes    Mizraim Harmening M.D on 05/06/2014 at 1:26 PM  Between 7am to 7pm - Pager - [phone]  After 7pm go to [email or web address] - password [email or web address]  And look for the night coverage person covering for me after hours  [facility] Office  [phone]   **Disclaimer: This note may have been dictated with voice recognition software. Similar sounding words can inadvertently be transcribed and this note may contain transcription errors which may not have been corrected upon publication of note.**

## 2014-05-06 NOTE — Evaluation (Signed)
Occupational Therapy Evaluation Patient Details Name: Judith Zuniga MRN: [id number] DOB: 26-May-1920 Today's Date: 05/06/2014    History of Present Illness Patient is a 81 y/o female who is s/p angiography and revascularization of her RLE on 05/04/14. During the procedure the pt experienced agitation and confusion immediately following administration of the initial dose of conscious sedation and the procedure was completed with some difficulty. She did return to the ER later that evening / early the next morning with worsening aphasia, right facial droop and slurring. Ct head- (-). PMH of dementia, on chronic coumadin (this was held for five days recently for procedure on 05/04/2014--recent INR on 1/20 1.54), pacemker, CAD, DM.   Clinical Impression   Patient independent PTA. Patient currently requires up to mod assist for ADLs and functional mobility. Patient will benefit from acute OT to increase overall independence in the areas of ADLs, functional mobility, and safety in order to safely discharge ->SNF.     Follow Up Recommendations  SNF;Supervision/Assistance - 24 hour    Equipment Recommendations   (tbd)    Recommendations for Other Services  None at this time     Precautions / Restrictions Precautions Precautions: Fall Restrictions Weight Bearing Restrictions: No Other Position/Activity Restrictions: No WB restrictions in chart relating to RLE s/p procedure.      Mobility - Per PT evaluation  Bed Mobility Overal bed mobility: Needs Assistance Bed Mobility: Supine to Sit     Supine to sit: Min assist;HOB elevated     General bed mobility comments: Use of rails. Increased effort. Min A to bring bottom to EOB.   Transfers Overall transfer level: Needs assistance Equipment used: Rolling walker (2 wheeled) Transfers: Sit to/from Stand Sit to Stand: Mod assist         General transfer comment: Mod A to rise from EOB with cues for anterior translation and hand placement.  Increased pain in RLE with WB.  Transferred to chair.    Balance - Per PT evaluation  Overall balance assessment: Needs assistance Sitting-balance support: Feet supported;No upper extremity supported Sitting balance-Leahy Scale: Fair     Standing balance support: During functional activity Standing balance-Leahy Scale: Poor Standing balance comment: Requires BUE support secondary to pain with WB through RLE.     ADL Overall ADL's : Needs assistance/impaired Eating/Feeding: Independent   Grooming: Supervision/safety;Sitting   Upper Body Bathing: Supervision/ safety;Sitting   Lower Body Bathing: Minimal assistance;Sit to/from stand   Upper Body Dressing : Supervision/safety;Sitting   Lower Body Dressing: Minimal assistance;Sit to/from stand   Toilet Transfer: Moderate assistance;RW;Comfort height toilet;Ambulation           Functional mobility during ADLs: Minimal assistance;Rolling walker General ADL Comments: Patient with decreased cognition, but able to follow multiple step commands. Patient able to bring BLEs up to self in order to complete LB dressing. Patient required up to mod assist for sit<>stand from recliner and frequent verbal cues for safety.      Vision  Additional Comments: Unable to fully assess secondary to cognition. Will continue to assess in functional context          Pertinent Vitals/Pain Pain Assessment: Faces Pain Score: 10-Worst pain ever Faces Pain Scale: Hurts even more Pain Location: distal RLE foot Pain Descriptors / Indicators: Burning Pain Intervention(s): Limited activity within patient's tolerance;Monitored during session;Repositioned;Patient requesting pain meds-RN notified    Hand Dominance Right   Extremity/Trunk Assessment Upper Extremity Assessment Upper Extremity Assessment: Generalized weakness   Lower Extremity Assessment Lower Extremity Assessment:  Defer to PT evaluation RLE Deficits / Details: Erythema, redness and  warmth present distal RLE. Wrap around toes s/p vascular procedure 1/20. Limited ankle AROM and hip AROM secondary to pain. Able to perform LAQ and place weight through RLE with pain. RLE Sensation: decreased light touch;history of peripheral neuropathy LLE Sensation: decreased light touch;history of peripheral neuropathy   Cervical / Trunk Assessment Cervical / Trunk Assessment: Normal   Communication Communication Communication: HOH   Cognition Arousal/Alertness: Awake/alert Behavior During Therapy: WFL for tasks assessed/performed Overall Cognitive Status: No family/caregiver present to determine baseline cognitive functioning             Home Living Family/patient expects to be discharged to:: Skilled nursing facility Living Arrangements: Alone Available Help at Discharge: Family;Available PRN/intermittently Type of Home: House Home Access: Level entry     Home Layout: One level   Home Equipment: Walker - 2 wheels;Cane - single point   Additional Comments: above is per PT evaluation      Prior Functioning/Environment Level of Independence: Independent        Comments: Reports being (I) for ADLs, drives. This info agrees with what is in MD chart per daughter report that pt lives alone and does all own ADLs. Reports not using AD PTA?    OT Diagnosis: Generalized weakness;Acute pain;Cognitive deficits   OT Problem List: Decreased strength;Decreased range of motion;Decreased activity tolerance;Impaired balance (sitting and/or standing);Decreased coordination;Decreased safety awareness;Decreased knowledge of use of DME or AE;Decreased knowledge of precautions;Pain   OT Treatment/Interventions: Self-care/ADL training;Therapeutic exercise;Energy conservation;DME and/or AE instruction;Therapeutic activities;Patient/family education;Balance training    OT Goals(Current goals can be found in the care plan section) Acute Rehab OT Goals Patient Stated Goal: "I don't know why  I'm here" OT Goal Formulation: With patient Time For Goal Achievement: 05/20/14 Potential to Achieve Goals: Good ADL Goals Pt Will Perform Grooming: with modified independence;standing Pt Will Perform Lower Body Bathing: with supervision;sit to/from stand Pt Will Perform Upper Body Dressing: Independently;sitting Pt Will Perform Lower Body Dressing: with supervision;sit to/from stand Pt Will Transfer to Toilet: with supervision;ambulating  OT Frequency: Min 2X/week   Barriers to D/C: Decreased caregiver support         End of Session Equipment Utilized During Treatment: Rolling walker Nurse Communication: Mobility status  Activity Tolerance: Patient tolerated treatment well Patient left: with call bell/phone within reach;with chair alarm set;with nursing/sitter in room   Time: 1191-4782 OT Time Calculation (min): 14 min Charges:  OT Evaluation $Initial OT Evaluation Tier I: 1 Procedure  Annick Dimaio , MS, OTR/L, CLT Pager: [phone]  05/06/2014, 10:11 AM

## 2014-05-06 NOTE — Progress Notes (Signed)
UR completed 

## 2014-05-06 NOTE — Clinical Social Work Placement (Signed)
Clinical Social Work Department CLINICAL SOCIAL WORK PLACEMENT NOTE 05/06/2014  Patient:  Judith Zuniga, Judith Zuniga  Account Number:  [id number] Admit date:  05/04/2014  Clinical Social Worker:  Paulette Blanch Bennie Scaff, LCSWA  Date/time:  05/06/2014 12:54 PM  Clinical Social Work is seeking post-discharge placement for this patient at the following level of care:   SKILLED NURSING   (*CSW will update this form in Epic as items are completed)   05/06/2014  Patient/family provided with [place] Department of Clinical Social Work's list of facilities offering this level of care within the geographic area requested by the patient (or if unable, by the patient's family).  05/06/2014  Patient/family informed of their freedom to choose among providers that offer the needed level of care, that participate in Medicare, Medicaid or managed care program needed by the patient, have an available bed and are willing to accept the patient.  05/06/2014  Patient/family informed of [facility]' ownership interest in [facility], as well as of the fact that they are under no obligation to receive care at this facility.  PASARR submitted to EDS on 05/06/2014 PASARR number received on 05/06/2014  FL2 transmitted to all facilities in geographic area requested by pt/family on  05/06/2014 FL2 transmitted to all facilities within larger geographic area on 05/06/2014  Patient informed that his/her managed care company has contracts with or will negotiate with  certain facilities, including the following:     Patient/family informed of bed offers received:   Patient chooses bed at  Physician recommends and patient chooses bed at    Patient to be transferred to  on   Patient to be transferred to facility by  Patient and family notified of transfer on  Name of family member notified:    The following physician request were entered in Epic:   Additional Comments:  Williams, MSW, [place]

## 2014-05-06 NOTE — Progress Notes (Signed)
STROKE TEAM PROGRESS NOTE   HISTORY OF PRESENT ILLNESS Judith Zuniga is an 81 y.o. female with known dementia, on chronic coumadin (this was held for five days recently for procedure on 05/04/2014--recent INR on 1/20 1.54)). Patient underwent a RLE angiogram and Athrectomy for Lower limb ischemia on 1/20. After the procedure daughter noted patient was very sleepy and possible right lower facial droop. She went home with her daughter. After sedation had worn off, it was noted she not only had right lower facial droop but also both expressive and receptive aphasia. Daughter states she was moving her limbs with full strength and showed no unilateral weakness. Pateint was brought back to hospital for due to these symptoms.  Currently patient is very drowsy--she will withdrawal from tactile stimuli, push me away but holds eye clenched tight and follows no instructions. She had received 1 mg Ativan at 0429 and daughter states she has remained drowsy since then.   Date last known well: Date: 05/04/2014 Time last known well: Time: 08:00 tPA Given: No: out of window   SUBJECTIVE (INTERVAL HISTORY) No family is at the bedside.  Overall she feels her condition is completely resolved. She is sitting comfortably in chair. She denies any discomfort, awake alert and conversing. She has dressing on the right foot with redness at left foot to above ankle.    OBJECTIVE Temp:  [98 F (36.7 C)-99.1 F (37.3 C)] 99.1 F (37.3 C) (01/22 1726) Pulse Rate:  [70-110] 110 (01/22 1726) Cardiac Rhythm:  [-] Ventricular paced (01/21 1959) Resp:  [18-20] 20 (01/22 1726) BP: (120-156)/(51-64) 150/52 mmHg (01/22 1726) SpO2:  [97 %-99 %] 99 % (01/22 1726)   Recent Labs Lab 05/06/14 0101 05/06/14 0510 05/06/14 0848 05/06/14 1110 05/06/14 1618  GLUCAP 142* 78 73 85 244*    Recent Labs Lab 05/04/14 0800 05/04/14 2127 05/04/14 2156 05/06/14 0406  NA 140 142 142 146*  K 4.0 4.0 4.6 3.4*  CL 101 107 107  113*  CO2 29  --  27 24  GLUCOSE 180* 152* 149* 85  BUN 21 23 21 19   CREATININE 1.15* 1.10 1.21* 1.05  CALCIUM 9.0  --  8.7 8.5    Recent Labs Lab 05/04/14 2156  AST 60*  ALT 23  ALKPHOS 69  BILITOT 1.5*  PROT 6.4  ALBUMIN 2.8*    Recent Labs Lab 05/04/14 0800 05/04/14 2127 05/04/14 2156 05/06/14 0500  WBC 8.9  --  8.4 6.3  NEUTROABS 7.5  --  7.1  --   HGB 10.4* 10.2* 9.5* 8.5*  HCT 32.0* 30.0* 28.8* 26.3*  MCV 92.8  --  93.2 93.3  PLT 188  --  159 152   No results for input(s): CKTOTAL, CKMB, CKMBINDEX, TROPONINI in the last 168 hours.  Recent Labs  05/04/14 0800 05/05/14 0514 05/06/14 0406  LABPROT 19.7* 18.7* 17.8*  18.8*  INR 1.65* 1.54* 1.45  1.56*    Recent Labs  05/05/14 0156  COLORURINE YELLOW  LABSPEC 1.010  PHURINE 5.0  GLUCOSEU NEGATIVE  HGBUR SMALL*  BILIRUBINUR NEGATIVE  KETONESUR NEGATIVE  PROTEINUR NEGATIVE  UROBILINOGEN 0.2  NITRITE NEGATIVE  LEUKOCYTESUR NEGATIVE       Component Value Date/Time   CHOL 108 05/05/2014 0514   TRIG 63 05/05/2014 0514   HDL 32* 05/05/2014 0514   CHOLHDL 3.4 05/05/2014 0514   VLDL 13 05/05/2014 0514   LDLCALC 63 05/05/2014 0514   Lab Results  Component Value Date   HGBA1C 6.3* 05/05/2014  No results found for: LABOPIA, COCAINSCRNUR, LABBENZ, AMPHETMU, THCU, LABBARB  No results for input(s): ETH in the last 168 hours.  I have personally reviewed the radiological images below and agree with the radiology interpretations.  Ct Head Wo Contrast  05/05/2014   IMPRESSION: No significant change from yesterday's examination. No hemorrhage or extra-axial fluid.      05/04/2014   IMPRESSION: 1. No acute intracranial abnormalities. 2. Age related volume loss. Mild chronic microvascular ischemic change.      Ir Angiogram Extremity Right  05/05/2014   IMPRESSION: 1. Right lower extremity runoff demonstrates widely patent inflow and outflow. There is extensive runoff disease including chronic long  segment occlusion of the anterior tibial artery and multifocal high-grade and moderate stenoses of the posterior tibial artery. The plantar arch remains intact. 2. Unsuccessful attempt at recanalization of the chronically occluded anterior tibial artery. Unfortunately, due to patient agitation and motion, pedal access for retrograde recanalization could not be performed. 3. Successful atherectomy and angioplasty of the diseased posterior tibial artery to 2.5 mm with significantly improved flow into the foot and plantar arch.     Carotid Doppler  40-59% right internal carotid artery stenosis by peak systolic velocity, 99-24% by ICA/CCA ratio, and 2-68% by end diastolic velocities. The left internal carotid artery exhbits 40-59% stenosis by peak systolic velocity, and 3-41% stenosis by end diastolic velocity and ICA/CCA ratio. Vertebral arteries are patent with antegrade flow.  2D Echocardiogram  - Left ventricle: The cavity size was mildly dilated. Wall thickness was normal. Systolic function was normal. The estimated ejection fraction was in the range of 55% to 60%. - Aortic valve: Av is thickened, calcified with restricted motion Peak and mean gradients through the valve are 48 and 24 mm Hg respectivey consistent with mild to moderate aortic stenosis. - Mitral valve: Calcified annulus. Mildly thickened leaflets . There was mild to moderate regurgitation. - Left atrium: The atrium was severely dilated. - Right ventricle: The cavity size was mildly dilated. - Pulmonary arteries: PA peak pressure: 65 mm Hg (S).  EKG  Ventricular paced rhythm. For complete results please see formal report.  PHYSICAL EXAM  Temp:  [98 F (36.7 C)-99.1 F (37.3 C)] 99.1 F (37.3 C) (01/22 1726) Pulse Rate:  [70-110] 110 (01/22 1726) Resp:  [18-20] 20 (01/22 1726) BP: (120-156)/(51-64) 150/52 mmHg (01/22 1726) SpO2:  [97 %-99 %] 99 % (01/22 1726)  General - Well nourished, well developed, in no  apparent distress.  Ophthalmologic - not cooperative on exam.  Cardiovascular - Regular rate and rhythm with systolic murmur 9-6/2.  Neck - supple, no carotid bruits  Mental Status -  Level of arousal and orientation to place, and person were intact, but not to time. Language including expression, naming, repetition, comprehension was assessed and found intact, mild dysarthria.  Cranial Nerves II - XII - II - Visual field intact OU. III, IV, VI - Extraocular movements intact. V - Facial sensation intact bilaterally. VII - Facial movement intact bilaterally. VIII - Hearing & vestibular intact bilaterally. X - Palate elevates symmetrically, mild dysarthria. XI - Chin turning & shoulder shrug intact bilaterally. XII - Tongue protrusion intact.  Motor Strength - The patient's strength was normal in all extremities and pronator drift was absent except RLE 4-/5 likely due to RLE ischemia chronic.  Bulk was normal and fasciculations were absent.   Motor Tone - Muscle tone was assessed at the neck and appendages and was normal.  Reflexes - The patient's reflexes were  symmetrical in all extremities and she had no pathological reflexes.  Sensory - Light touch, temperature/pinprick were assessed and were symmetrical.    Coordination - The patient had normal movements in the hands with no ataxia or dysmetria.  Tremor was absent.  Gait and Station - not tested due to RLE weakness.   ASSESSMENT/PLAN Judith Zuniga is a 80 y.o. female with history of DM, HTN, HLD, dementia, CAD s/p CABG, complete cardiac block s/p pacer, afib on coumadin admitted for lethargy and right facial droop. She was taken off coumadin for RLE angiogram and potential angioplasty and INR 1.5 on admission. Symptoms resolved.    Lethargy - likely due to sedative medication effect on elderly. Although small stroke or TIA not able to completely ruled out due to hx of afib and INR not therapeutic, they are considered less  likely.  Repeat CT head did not show acute changes.  Carotid Doppler  Bilateral carotid stenosis, but pt not candidate for intervention, will not pursue further  2D Echo  unremarkable  LDL 63  HgbA1c 6.3  SCDs for VTE prophylaxis  Diet regular   warfarin prior to admission, now on aspirin 81 mg orally every day and warfarin. Due to subtherapeutic INR, we recommend to consider lovenox bridging.   Patient counseled to be compliant with her antithrombotic medications  Ongoing aggressive stroke risk factor management  Therapy recommendations:  pending  Disposition:  Pending  pAfib  On coumadin at home  subtherapeutic INR due to holding off coumadin for procedure  Resume coumadin  Consider bridging with lovenox  PAD - RLE  S/p angioplasty  Vascular following  Chronic RLE weakness due to PAD  Diabetes  HgbA1c 6.3 goal < 7.0  Controlled  CBG monitoring  SSI  DM education  Hypertension  Home meds:   Losartan, zebeta Home meds resumed this admission  Stable  Patient counseled to be compliant with her blood pressure medications  Hyperlipidemia  Home meds:  lipitor 40   Currently on lipitor 40  LDL 63, goal < 70  Continue statin at discharge  Other Stroke Risk Factors  Advanced age  Coronary artery disease  Hospital day # 2  I have personally obtained the history, examined the patient, evaluated laboratory data, individually viewed imaging studies and agree with radiology interpretations. I also discussed with Dr. Waldron Labs regarding her care plan.   Neurology will sign off. Please call with questions. No neuro follow up indicated. Thanks for the consult.  Rosalin Hawking, MD PhD Stroke Neurology 05/06/2014 5:49 PM    To contact Stroke Continuity provider, please refer to [email or web address] After hours, contact General Neurology

## 2014-05-07 LAB — GLUCOSE, CAPILLARY
GLUCOSE-CAPILLARY: 135 mg/dL — AB (ref 70–99)
GLUCOSE-CAPILLARY: 150 mg/dL — AB (ref 70–99)
GLUCOSE-CAPILLARY: 159 mg/dL — AB (ref 70–99)
Glucose-Capillary: 111 mg/dL — ABNORMAL HIGH (ref 70–99)
Glucose-Capillary: 117 mg/dL — ABNORMAL HIGH (ref 70–99)
Glucose-Capillary: 177 mg/dL — ABNORMAL HIGH (ref 70–99)

## 2014-05-07 LAB — CBC
HCT: 27.6 % — ABNORMAL LOW (ref 36.0–46.0)
HEMOGLOBIN: 9 g/dL — AB (ref 12.0–15.0)
MCH: 30.5 pg (ref 26.0–34.0)
MCHC: 32.6 g/dL (ref 30.0–36.0)
MCV: 93.6 fL (ref 78.0–100.0)
Platelets: 161 10*3/uL (ref 150–400)
RBC: 2.95 MIL/uL — ABNORMAL LOW (ref 3.87–5.11)
RDW: 13.9 % (ref 11.5–15.5)
WBC: 8.4 10*3/uL (ref 4.0–10.5)

## 2014-05-07 LAB — BASIC METABOLIC PANEL
ANION GAP: 10 (ref 5–15)
BUN: 17 mg/dL (ref 6–23)
CALCIUM: 8.7 mg/dL (ref 8.4–10.5)
CHLORIDE: 110 mmol/L (ref 96–112)
CO2: 23 mmol/L (ref 19–32)
CREATININE: 1.03 mg/dL (ref 0.50–1.10)
GFR calc Af Amer: 53 mL/min — ABNORMAL LOW (ref >90)
GFR calc non Af Amer: 46 mL/min — ABNORMAL LOW (ref >90)
GLUCOSE: 127 mg/dL — AB (ref 70–99)
POTASSIUM: 4 mmol/L (ref 3.5–5.1)
Sodium: 143 mmol/L (ref 135–145)

## 2014-05-07 LAB — PROTIME-INR
INR: 1.62 — AB (ref 0.00–1.49)
Prothrombin Time: 19.3 seconds — ABNORMAL HIGH (ref 11.6–15.2)

## 2014-05-07 MED ORDER — WARFARIN SODIUM 7.5 MG PO TABS: 7.5000 mg | ORAL_TABLET | Freq: Once | ORAL | Status: DC

## 2014-05-07 MED ORDER — ENOXAPARIN SODIUM 60 MG/0.6ML ~~LOC~~ SOLN
55.0000 mg | SUBCUTANEOUS | Status: DC
  Administered 2014-05-07 – 2014-05-08 (×2): 55 mg via SUBCUTANEOUS
  Filled 2014-05-07 (×3): qty 0.6

## 2014-05-07 MED ORDER — INSULIN ASPART 100 UNIT/ML ~~LOC~~ SOLN
0.0000 [IU] | Freq: Three times a day (TID) | SUBCUTANEOUS | Status: DC
  Administered 2014-05-08: 1 [IU] via SUBCUTANEOUS
  Administered 2014-05-08: 2 [IU] via SUBCUTANEOUS
  Administered 2014-05-09: 3 [IU] via SUBCUTANEOUS
  Administered 2014-05-09: 2 [IU] via SUBCUTANEOUS
  Administered 2014-05-10: 3 [IU] via SUBCUTANEOUS
  Administered 2014-05-10 – 2014-05-11 (×2): 1 [IU] via SUBCUTANEOUS
  Administered 2014-05-11 – 2014-05-12 (×2): 2 [IU] via SUBCUTANEOUS
  Administered 2014-05-12: 5 [IU] via SUBCUTANEOUS
  Administered 2014-05-13 (×2): 2 [IU] via SUBCUTANEOUS
  Administered 2014-05-14 – 2014-05-15 (×3): 1 [IU] via SUBCUTANEOUS

## 2014-05-07 MED ORDER — INSULIN ASPART 100 UNIT/ML ~~LOC~~ SOLN: 0.0000 [IU] | Freq: Every day | SUBCUTANEOUS | Status: DC

## 2014-05-07 NOTE — Progress Notes (Signed)
Patient Demographics  Judith Zuniga, is a 81 y.o. female, DOB - 02-18-1921, WGN:[id number]  Admit date - 05/04/2014   Admitting Physician Theressa Millard, MD  Outpatient Primary MD for the patient is Jani Gravel, MD  LOS - 3   Chief Complaint  Patient presents with  . Extremity Weakness  . Stroke Symptoms       Admission history of present illness: Judith Zuniga is a 81 y.o. female with a history of CAD/Ischemic Cardiomyopathy, Complete AV Block S/P St Jude Biventricular Pacemaker, Chronic Atrial Fibrillation on Coumadin Rx, Systolic CHF, DM2, HTN, PAD, and Dementia who was brought to the ED via EMS due to symptoms of Increased Confusion, Slurring of her speech and a right facial droop noticed by her daughter at 3 pm during the day following her having an Angiogram and angioplasty of the Rt Lower Extremity by Interventional Radiology in short stay for an ischemic right foot. The history is per the daughter who reports that her mother's words were not making sense, and her speech was slurred. At baseline she has mild dementia and is on Namenda Rx but lives alone and is able to perform all of her ADLs Agent had significant agitation on admission, where she was given IV Ativan, so initially patient could not be appropriately evaluated, CT head did not show any acute findings x2 , MRI could not be done secondary to pacemaker,  patient continues to improve , where she is currently back to her baseline . Subjective:   Judith Zuniga had a good night sleep, with no significant events. Denies any chest pain, shortness of breath, headache, fever or chills. Assessment & Plan    Active Problems:   Hyperlipidemia   Benign hypertensive heart disease with heart failure   CAD, ARTERY BYPASS GRAFT   PACEMAKER, CRT-St. Jude   Long term current use of anticoagulant   Diastolic  CHF, chronic   Atherosclerosis of native arteries of the extremities with ulceration   Delirium   Ischemic cardiomyopathy   Chronic atrial fibrillation   Diabetes mellitus   Systolic heart failure   Chronic systolic heart failure   DM (diabetes mellitus) type II uncontrolled, periph vascular disorder   HLD (hyperlipidemia)  Acute encephalopathy, facial droop, slurred speech - Unclear if this is related to acute CVA  versus TIA or not, given unable to do MRI secondary to pacemaker, but this is most likely related to acute encephalopathy from medication and procedure. -  2-D echo EF of 55%, multiple valvular disease.,  - carotid Dopplers,40-59% right internal carotid artery stenosis by peak systolic velocity, 78-46% by ICA/CCA ratio, and 9-62% by end diastolic velocities. The left internal carotid artery exhbits 40-59% stenosis by peak systolic velocity, and 9-52% stenosis by end diastolic velocity and ICA/CCA ratio. Vertebral arteries are patent with antegrade flow. 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 - Vascular Procedure done by Interventional Radiology Dr. Laurence Ferrari - Orthopedic consult appreciated, patient noted to have worsening of her ischemic changes, possible need for surgical intervention, so will hold warfarin.  Ischemic cardiomyopathy/CAD, ARTERY BYPASS GRAFT/Systolic CHF - On  Bisoprolol, Losartan, and Atrovastatin   Chronic atrial fibrillation -  Currently holding warfarin for possible need for surgical intervention, currently on Lovenox therapeutic dose     Diastolic CHF, chronic - On  Bisoprolol, and Losartan    Hyperlipidemia -On Atorvastatin Rx   Benign hypertensive heart disease with heart failure - On Bisoprolol, Losartan,  Rx   PACEMAKER, CRT-St. Jude- due to Complete Heart Block   Hypokalemia -Repleted, continue to monitor   DM2- -Hold  Amaryl - SSI coverage PRN - Hemoglobin A1c 6.3      Code Status:Full  Family Communication:Spoke with daughter over the phone  Disposition Plan: Remains inpatient   Procedures  None   Consults  Neurology Orthopedic  Medications  Scheduled Meds: . antiseptic oral rinse  7 mL Mouth Rinse q12n4p  . aspirin EC  81 mg Oral Daily  . atorvastatin  40 mg Oral Daily  . bisoprolol  10 mg Oral Daily  . chlorhexidine  15 mL Mouth Rinse BID  . dextrose  25 mL Intravenous Once  . enoxaparin (LOVENOX) injection  55 mg Subcutaneous Q24H  . insulin aspart  0-9 Units Subcutaneous 6 times per day  . losartan  50 mg Oral Daily  . memantine  28 mg Oral Daily  . potassium chloride  10 mEq Oral Daily  . Warfarin - Pharmacist Dosing Inpatient   Does not apply q1800   Continuous Infusions: . sodium chloride 75 mL/hr at 05/05/14 1819   PRN Meds:.acetaminophen, senna-docusate  DVT Prophylaxis  On Lovenox treatment dose  Lab Results  Component Value Date   PLT 161 05/07/2014    Antibiotics   Anti-infectives    None          Objective:   Filed Vitals:   05/06/14 2129 05/07/14 0300 05/07/14 0500 05/07/14 0955  BP: 138/67 149/74 150/74 140/48  Pulse: 70 70 71 71  Temp: 98.4 F (36.9 C) 98.1 F (36.7 C) 98.6 F (37 C) 98.6 F (37 C)  TempSrc: Oral Oral Oral Oral  Resp: 18 18 18 20   Height:      Weight:      SpO2: 97% 98% 99% 98%    Wt Readings from Last 3 Encounters:  05/05/14 55.1 kg (121 lb 7.6 oz)  04/26/14 56.7 kg (125 lb)  12/15/13 56.7 kg (125 lb)    No intake or output data in the 24 hours ending 05/07/14 1353   Physical Exam  Patient in deep sleep this a.m. after receiving IV Ativan, hard to arouse. [place].AT, Supple Neck,No JVD, No cervical lymphadenopathy appriciated.  Symmetrical Chest wall movement, Good air movement bilaterally, CTAB RRR,No Gallops,Rubs or new Murmurs, No Parasternal Heave +ve B.Sounds, Abd Soft, No tenderness, No organomegaly  appriciated, No rebound - guarding or rigidity. No Cyanosis, Clubbing or edema, No new Rash or bruise  right leg dressing removed and patient having right lateral foot wound, necrotic.   Data Review   Micro Results No results found for this or any previous visit (from the past 240 hour(s)).  Radiology Reports No results found.  CBC  Recent Labs Lab 05/04/14 0800 05/04/14 2127 05/04/14 2156 05/06/14 0500 05/07/14 0510  WBC 8.9  --  8.4 6.3 8.4  HGB 10.4* 10.2* 9.5* 8.5* 9.0*  HCT 32.0* 30.0* 28.8* 26.3* 27.6*  PLT 188  --  159 152 161  MCV 92.8  --  93.2 93.3 93.6  MCH 30.1  --  30.7 30.1 30.5  MCHC 32.5  --  33.0 32.3 32.6  RDW 13.8  --  13.9 13.8 13.9  LYMPHSABS 0.8  --  0.7  --   --   MONOABS 0.6  --  0.6  --   --   EOSABS 0.0  --  0.0  --   --   BASOSABS 0.0  --  0.0  --   --     Chemistries   Recent Labs Lab 05/04/14 0800 05/04/14 2127 05/04/14 2156 05/06/14 0406 05/07/14 0510  NA 140 142 142 146* 143  K 4.0 4.0 4.6 3.4* 4.0  CL 101 107 107 113* 110  CO2 29  --  27 24 23   GLUCOSE 180* 152* 149* 85 127*  BUN 21 23 21 19 17   CREATININE 1.15* 1.10 1.21* 1.05 1.03  CALCIUM 9.0  --  8.7 8.5 8.7  AST  --   --  60*  --   --   ALT  --   --  23  --   --   ALKPHOS  --   --  69  --   --   BILITOT  --   --  1.5*  --   --    ------------------------------------------------------------------------------------------------------------------ estimated creatinine clearance is 28.1 mL/min (by C-G formula based on Cr of 1.03). ------------------------------------------------------------------------------------------------------------------  Recent Labs  05/05/14 0514  HGBA1C 6.3*   ------------------------------------------------------------------------------------------------------------------  Recent Labs  05/05/14 0514  CHOL 108  HDL 32*  LDLCALC 63  TRIG 63  CHOLHDL 3.4    ------------------------------------------------------------------------------------------------------------------ No results for input(s): TSH, T4TOTAL, T3FREE, THYROIDAB in the last 72 hours.  Invalid input(s): FREET3 ------------------------------------------------------------------------------------------------------------------ No results for input(s): VITAMINB12, FOLATE, FERRITIN, TIBC, IRON, RETICCTPCT in the last 72 hours.  Coagulation profile  Recent Labs Lab 05/04/14 0800 05/05/14 0514 05/06/14 0406 05/07/14 0510  INR 1.65* 1.54* 1.45  1.56* 1.62*    No results for input(s): DDIMER in the last 72 hours.  Cardiac Enzymes No results for input(s): CKMB, TROPONINI, MYOGLOBIN in the last 168 hours.  Invalid input(s): CK ------------------------------------------------------------------------------------------------------------------ Invalid input(s): POCBNP     Time Spent in minutes   25 minutes    Brandonlee Navis M.D on 05/07/2014 at 1:53 PM  Between 7am to 7pm - Pager - [phone]  After 7pm go to [email or web address] - password [email or web address]  And look for the night coverage person covering for me after hours  [facility] Office  [phone]   **Disclaimer: This note may have been dictated with voice recognition software. Similar sounding words can inadvertently be transcribed and this note may contain transcription errors which may not have been corrected upon publication of note.**

## 2014-05-07 NOTE — Progress Notes (Signed)
ANTICOAGULATION CONSULT NOTE  Pharmacy Consult for Coumadin/Lovenox Indication: atrial fibrillation  No Known Allergies  Patient Measurements: Height: 5\' 2"  (157.5 cm) Weight: 121 lb 7.6 oz (55.1 kg) IBW/kg (Calculated) : 50.1 Vital Signs: Temp: 98.6 F (37 C) (01/23 0500) Temp Source: Oral (01/23 0500) BP: 150/74 mmHg (01/23 0500) Pulse Rate: 71 (01/23 0500)  Labs:  Recent Labs  05/04/14 0800  05/04/14 2156 05/05/14 0514 05/06/14 0406 05/06/14 0500 05/07/14 0510  HGB 10.4*  < > 9.5*  --   --  8.5* 9.0*  HCT 32.0*  < > 28.8*  --   --  26.3* 27.6*  PLT 188  --  159  --   --  152 161  APTT 39*  --   --   --   --   --   --   LABPROT 19.7*  --   --  18.7* 17.8*  18.8*  --  19.3*  INR 1.65*  --   --  1.54* 1.45  1.56*  --  1.62*  HEPARINUNFRC  --   --   --   --  <0.10*  --   --   CREATININE 1.15*  < > 1.21*  --  1.05  --  1.03  < > = values in this interval not displayed.  Estimated Creatinine Clearance: 28.1 mL/min (by C-G formula based on Cr of 1.03).  Assessment: 81 year old female with h/o atrial fibrillation, INR subtherapeutic, for anticoagulation.  Lovenox to start today as bridge unti INR > 2  Goal of Therapy:  Full anticoagulation with Lovenox INR 2-3 Monitor platelets by anticoagulation protocol: Yes   Plan:  Lovenox 55 mg SQ q24h (CrCl < 30 mL/min) Coumadin 7.5 mg today  Judith Zuniga, Bronson Curb 05/07/2014,7:41 AM

## 2014-05-07 NOTE — Progress Notes (Signed)
Patient ID: Judith Zuniga, female   DOB: Oct 02, 1920, 81 y.o.   MRN: [id number] I examined Ms. Haskin' right foot and noted the worsening ischemic changes.  Her great toe and first ray is now extensively necrotic. There is no gross purulence and her vitals are stable.  She does have ischemic pain.  I spoke with her daughter and spoke with Dr. Sharol Given by phone.  Dr. Sharol Given has seen her for a while and knows her baseline health status and how her foot has been looking.  He will come by Monday am to assess her foot and make further recommendations as to the possibility of surgery and to what extent.

## 2014-05-07 NOTE — Consult Note (Addendum)
WOC consult was requested prior to Ortho team involvement.  Refer to progress note that foot is increasingly necrotic; topical treatment will not be effective. EMR indicates Ortho team will plan to follow for further plan of care. Please re-consult if further assistance is needed.  Thank-you,  Julien Girt MSN, [place], [place], [place], [place]

## 2014-05-08 LAB — GLUCOSE, CAPILLARY
GLUCOSE-CAPILLARY: 127 mg/dL — AB (ref 70–99)
GLUCOSE-CAPILLARY: 162 mg/dL — AB (ref 70–99)
GLUCOSE-CAPILLARY: 111 mg/dL — AB (ref 70–99)
Glucose-Capillary: 116 mg/dL — ABNORMAL HIGH (ref 70–99)

## 2014-05-08 LAB — BASIC METABOLIC PANEL
ANION GAP: 9 (ref 5–15)
BUN: 17 mg/dL (ref 6–23)
CO2: 24 mmol/L (ref 19–32)
CREATININE: 1.04 mg/dL (ref 0.50–1.10)
Calcium: 8.4 mg/dL (ref 8.4–10.5)
Chloride: 110 mmol/L (ref 96–112)
GFR calc Af Amer: 53 mL/min — ABNORMAL LOW (ref >90)
GFR, EST NON AFRICAN AMERICAN: 46 mL/min — AB (ref >90)
Glucose, Bld: 122 mg/dL — ABNORMAL HIGH (ref 70–99)
POTASSIUM: 3.8 mmol/L (ref 3.5–5.1)
Sodium: 143 mmol/L (ref 135–145)

## 2014-05-08 LAB — CBC
HEMATOCRIT: 26.8 % — AB (ref 36.0–46.0)
Hemoglobin: 8.8 g/dL — ABNORMAL LOW (ref 12.0–15.0)
MCH: 30.6 pg (ref 26.0–34.0)
MCHC: 32.8 g/dL (ref 30.0–36.0)
MCV: 93.1 fL (ref 78.0–100.0)
Platelets: 169 10*3/uL (ref 150–400)
RBC: 2.88 MIL/uL — AB (ref 3.87–5.11)
RDW: 14.1 % (ref 11.5–15.5)
WBC: 9.5 10*3/uL (ref 4.0–10.5)

## 2014-05-08 LAB — PROTIME-INR
INR: 1.69 — AB (ref 0.00–1.49)
Prothrombin Time: 20 seconds — ABNORMAL HIGH (ref 11.6–15.2)

## 2014-05-08 NOTE — Progress Notes (Signed)
Subjective: Pt sitting up in bed but continued confusion She does c/o of left foot pain and was trying to get her dressing off. Denies groin pain.   Objective: Physical Exam: BP 156/63 mmHg  Pulse 75  Temp(Src) 97.5 F (36.4 C) (Oral)  Resp 20  Ht 5\' 2"  (1.575 m)  Wt 121 lb 7.6 oz (55.1 kg)  BMI 22.21 kg/m2  SpO2 95% (L)groin site clean, soft, no hematoma (R)LE warm to foot, dressing intact but great toe with dry necrotic changes.    Labs: CBC  Recent Labs  05/07/14 0510 05/08/14 0353  WBC 8.4 9.5  HGB 9.0* 8.8*  HCT 27.6* 26.8*  PLT 161 169   BMET  Recent Labs  05/07/14 0510 05/08/14 0353  NA 143 143  K 4.0 3.8  CL 110 110  CO2 23 24  GLUCOSE 127* 122*  BUN 17 17  CREATININE 1.03 1.04  CALCIUM 8.7 8.4   LFT No results for input(s): PROT, ALBUMIN, AST, ALT, ALKPHOS, BILITOT, BILIDIR, IBILI, LIPASE in the last 72 hours. PT/INR  Recent Labs  05/07/14 0510 05/08/14 0353  LABPROT 19.3* 20.0*  INR 1.62* 1.69*     Studies/Results: No results found.  Assessment/Plan: Ischemic right great toe, s/p recent (R)LE arteriogram and revasc of PTA. Post procedural stroke Ortho recs noted. Will report to Dr. Laurence Ferrari    LOS: 4 days    Ascencion Dike PA-C 05/08/2014 8:44 AM

## 2014-05-08 NOTE — Progress Notes (Signed)
Patient Demographics  Judith Zuniga, is a 81 y.o. female, DOB - 1921/01/16, OJJ:[id number]  Admit date - 05/04/2014   Admitting Physician Theressa Millard, MD  Outpatient Primary MD for the patient is Jani Gravel, MD  LOS - 4   Chief Complaint  Patient presents with  . Extremity Weakness  . Stroke Symptoms       Admission history of present illness/brief narrative: Judith Zuniga is a 81 y.o. female with a history of CAD/Ischemic Cardiomyopathy, Complete AV Block S/P St Jude Biventricular Pacemaker, Chronic Atrial Fibrillation on Coumadin Rx, Systolic CHF, DM2, HTN, PAD, and Dementia who was brought to the ED via EMS due to symptoms of Increased Confusion, Slurring of her speech and a right facial droop noticed by her daughter at 3 pm during the day following her having an Angiogram and angioplasty of the Rt Lower Extremity by Interventional Radiology in short stay for an ischemic right foot. The history is per the daughter who reports that her mother's words were not making sense, and her speech was slurred. At baseline she has mild dementia and is on Namenda Rx but lives alone and is able to perform all of her ADLs Agent had significant agitation on admission, where she was given IV Ativan, so initially patient could not be appropriately evaluated, CT head did not show any acute findings x2 , MRI could not be done secondary to pacemaker,  patient continues to improve , where she is currently back to her baseline . Patient was evaluated by orthopedic, secondary to worsening great toe necrosis. Subjective:   Judith Zuniga had a good night sleep, with no significant events. Denies any chest pain, shortness of breath, headache, fever or chills. Assessment & Plan    Active Problems:   Hyperlipidemia   Benign hypertensive heart disease with heart failure   CAD, ARTERY  BYPASS GRAFT   PACEMAKER, CRT-St. Jude   Long term current use of anticoagulant   Diastolic CHF, chronic   Atherosclerosis of native arteries of the extremities with ulceration   Delirium   Ischemic cardiomyopathy   Chronic atrial fibrillation   Diabetes mellitus   Systolic heart failure   Chronic systolic heart failure   DM (diabetes mellitus) type II uncontrolled, periph vascular disorder   HLD (hyperlipidemia)  Acute encephalopathy, facial droop, slurred speech - Unclear if this is related to acute CVA  versus TIA or not, given unable to do MRI secondary to pacemaker, but this is most likely related to acute encephalopathy from medication and procedure, patient has no residual neurologic deficits. -  2-D echo EF of 55%, multiple valvular disease.,  - carotid Dopplers,40-59% right internal carotid artery stenosis by peak systolic velocity, 93-71% by ICA/CCA ratio, and 6-96% by end diastolic velocities. The left internal carotid artery exhbits 40-59% stenosis by peak systolic velocity, and 7-89% stenosis by end diastolic velocity and ICA/CCA ratio. Vertebral arteries are patent with antegrade flow. 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 - Vascular Procedure done by Interventional Radiology Dr. Laurence Ferrari 1/20 , status post right lower extremity arteriogram and revascularization (PTA) and atherectomy of PT and atherectomy, and unsuccessful recanalization of AT. -  Orthopedic consult appreciated, patient noted to have worsening of her ischemic changes, possible need for surgical intervention, so will hold warfarin, will be seen by Dr. Sharol Given tomorrow  Ischemic cardiomyopathy/CAD, ARTERY BYPASS GRAFT/Systolic CHF - On  Bisoprolol, Losartan, and Atrovastatin   Chronic atrial fibrillation - Currently holding warfarin for possible need for surgical intervention, currently on Lovenox therapeutic dose      Diastolic CHF, chronic - On  Bisoprolol, and Losartan    Hyperlipidemia -On Atorvastatin Rx   Benign hypertensive heart disease with heart failure - On Bisoprolol, Losartan,  Rx   PACEMAKER, CRT-St. Jude- due to Complete Heart Block   Hypokalemia -Repleted, continue to monitor   DM2- -Hold Amaryl - SSI coverage PRN - Hemoglobin A1c 6.3      Code Status:Full Family Communication: None at bedside  Disposition Plan: Remains inpatient   Procedures  None   Consults  Neurology Orthopedic Interventional radiology  Medications  Scheduled Meds: . antiseptic oral rinse  7 mL Mouth Rinse q12n4p  . aspirin EC  81 mg Oral Daily  . atorvastatin  40 mg Oral Daily  . bisoprolol  10 mg Oral Daily  . chlorhexidine  15 mL Mouth Rinse BID  . dextrose  25 mL Intravenous Once  . enoxaparin (LOVENOX) injection  55 mg Subcutaneous Q24H  . insulin aspart  0-5 Units Subcutaneous QHS  . insulin aspart  0-9 Units Subcutaneous TID WC  . losartan  50 mg Oral Daily  . memantine  28 mg Oral Daily  . potassium chloride  10 mEq Oral Daily   Continuous Infusions: . sodium chloride 75 mL/hr at 05/05/14 1819   PRN Meds:.acetaminophen, senna-docusate  DVT Prophylaxis  On Lovenox treatment dose  Lab Results  Component Value Date   PLT 169 05/08/2014    Antibiotics   Anti-infectives    None          Objective:   Filed Vitals:   05/07/14 2157 05/08/14 0230 05/08/14 0701 05/08/14 0944  BP: 121/58 147/55 156/63 144/57  Pulse: 72 77 75 70  Temp: 98.7 F (37.1 C) 98.5 F (36.9 C) 97.5 F (36.4 C) 98.1 F (36.7 C)  TempSrc: Oral Oral Oral Oral  Resp: 20 20 20 20   Height:      Weight:      SpO2: 99% 99% 95% 98%    Wt Readings from Last 3 Encounters:  05/05/14 55.1 kg (121 lb 7.6 oz)  04/26/14 56.7 kg (125 lb)  12/15/13 56.7 kg (125 lb)    No intake or output data in the 24 hours ending 05/08/14 1124   Physical Exam  Patient is  awake, alert, confused, pleasant. [place].AT, Supple Neck,No JVD, No cervical lymphadenopathy appriciated.  Symmetrical Chest wall movement, Good air movement bilaterally, CTAB RRR,No Gallops,Rubs or new Murmurs, No Parasternal Heave +ve B.Sounds, Abd Soft, No tenderness, No organomegaly appriciated, No rebound - guarding or rigidity. No Cyanosis, Clubbing or edema, No new Rash or bruise  right leg dressing removed and patient having right lateral foot wound, necrotic.   Data Review   Micro Results No results found for this or any previous visit (from the past 240 hour(s)).  Radiology Reports No results found.  CBC  Recent Labs Lab 05/04/14 0800 05/04/14 2127 05/04/14 2156 05/06/14 0500 05/07/14 0510 05/08/14 0353  WBC 8.9  --  8.4 6.3 8.4 9.5  HGB 10.4* 10.2* 9.5* 8.5* 9.0* 8.8*  HCT 32.0* 30.0* 28.8* 26.3* 27.6* 26.8*  PLT 188  --  159  152 161 169  MCV 92.8  --  93.2 93.3 93.6 93.1  MCH 30.1  --  30.7 30.1 30.5 30.6  MCHC 32.5  --  33.0 32.3 32.6 32.8  RDW 13.8  --  13.9 13.8 13.9 14.1  LYMPHSABS 0.8  --  0.7  --   --   --   MONOABS 0.6  --  0.6  --   --   --   EOSABS 0.0  --  0.0  --   --   --   BASOSABS 0.0  --  0.0  --   --   --     Chemistries   Recent Labs Lab 05/04/14 0800 05/04/14 2127 05/04/14 2156 05/06/14 0406 05/07/14 0510 05/08/14 0353  NA 140 142 142 146* 143 143  K 4.0 4.0 4.6 3.4* 4.0 3.8  CL 101 107 107 113* 110 110  CO2 29  --  27 24 23 24   GLUCOSE 180* 152* 149* 85 127* 122*  BUN 21 23 21 19 17 17   CREATININE 1.15* 1.10 1.21* 1.05 1.03 1.04  CALCIUM 9.0  --  8.7 8.5 8.7 8.4  AST  --   --  60*  --   --   --   ALT  --   --  23  --   --   --   ALKPHOS  --   --  69  --   --   --   BILITOT  --   --  1.5*  --   --   --    ------------------------------------------------------------------------------------------------------------------ estimated creatinine clearance is 27.9 mL/min (by C-G formula based on Cr of  1.04). ------------------------------------------------------------------------------------------------------------------ No results for input(s): HGBA1C in the last 72 hours. ------------------------------------------------------------------------------------------------------------------ No results for input(s): CHOL, HDL, LDLCALC, TRIG, CHOLHDL, LDLDIRECT in the last 72 hours. ------------------------------------------------------------------------------------------------------------------ No results for input(s): TSH, T4TOTAL, T3FREE, THYROIDAB in the last 72 hours.  Invalid input(s): FREET3 ------------------------------------------------------------------------------------------------------------------ No results for input(s): VITAMINB12, FOLATE, FERRITIN, TIBC, IRON, RETICCTPCT in the last 72 hours.  Coagulation profile  Recent Labs Lab 05/04/14 0800 05/05/14 0514 05/06/14 0406 05/07/14 0510 05/08/14 0353  INR 1.65* 1.54* 1.45  1.56* 1.62* 1.69*    No results for input(s): DDIMER in the last 72 hours.  Cardiac Enzymes No results for input(s): CKMB, TROPONINI, MYOGLOBIN in the last 168 hours.  Invalid input(s): CK ------------------------------------------------------------------------------------------------------------------ Invalid input(s): POCBNP     Time Spent in minutes   25 minutes    Reita Shindler M.D on 05/08/2014 at 11:24 AM  Between 7am to 7pm - Pager - [phone]  After 7pm go to [email or web address] - password [email or web address]  And look for the night coverage person covering for me after hours  [facility] Office  [phone]   **Disclaimer: This note may have been dictated with voice recognition software. Similar sounding words can inadvertently be transcribed and this note may contain transcription errors which may not have been corrected upon publication of note.**

## 2014-05-09 ENCOUNTER — Other Ambulatory Visit (HOSPITAL_COMMUNITY): Payer: Self-pay | Admitting: Orthopedic Surgery

## 2014-05-09 DIAGNOSIS — I639 Cerebral infarction, unspecified: Secondary | ICD-10-CM | POA: Insufficient documentation

## 2014-05-09 LAB — GLUCOSE, CAPILLARY
GLUCOSE-CAPILLARY: 177 mg/dL — AB (ref 70–99)
Glucose-Capillary: 94 mg/dL (ref 70–99)
Glucose-Capillary: 204 mg/dL — ABNORMAL HIGH (ref 70–99)
Glucose-Capillary: 150 mg/dL — ABNORMAL HIGH (ref 70–99)

## 2014-05-09 LAB — PROTIME-INR
INR: 1.75 — AB (ref 0.00–1.49)
Prothrombin Time: 20.6 seconds — ABNORMAL HIGH (ref 11.6–15.2)

## 2014-05-09 LAB — HEPARIN LEVEL (UNFRACTIONATED): Heparin Unfractionated: <0.1 IU/mL — ABNORMAL LOW (ref 0.30–0.70)

## 2014-05-09 MED ORDER — HEPARIN (PORCINE) IN NACL 100-0.45 UNIT/ML-% IJ SOLN
950.0000 [IU]/h | INTRAMUSCULAR | Status: DC
  Administered 2014-05-09: 650 [IU]/h via INTRAVENOUS
  Filled 2014-05-09: qty 250

## 2014-05-09 NOTE — Progress Notes (Signed)
Patient ID: Judith Zuniga, female   DOB: [birth year], 81 y.o.   MRN: [id number] Patient is status post revascularization with [facility] imaging to the right lower extremity. She does not have flow through the anterior tibial artery healing well was through the posterior tibial artery. She did have an open arch. Examination this time she has dry gangrene of the entire first ray with black ischemic changes of the lesser toes as well. There is ischemic changes of the foot going all the way up to the hindfoot. Assessment rapidly progressive ischemic changes to the right foot status post revascularization with [facility] imaging.  Plan: I discussed with the patient's daughter the only option would be a transtibial amputation. There are no foot salvage options at this time. Patient will need to go to a rehabilitation facility with memory care. Anticipate surgery Tuesday or Wednesday. Patient may be discharged a day or 2 after surgery.

## 2014-05-09 NOTE — Progress Notes (Signed)
Patient Demographics  Judith Zuniga, is a 81 y.o. female, DOB - Jun 10, 1920, BHA:[id number]  Admit date - 05/04/2014   Admitting Physician Theressa Millard, MD  Outpatient Primary MD for the patient is Jani Gravel, MD  LOS - 5   Chief Complaint  Patient presents with  . Extremity Weakness  . Stroke Symptoms       Admission history of present illness/brief narrative: Judith Zuniga is a 81 y.o. female with a history of CAD/Ischemic Cardiomyopathy, Complete AV Block S/P St Jude Biventricular Pacemaker, Chronic Atrial Fibrillation on Coumadin Rx, Systolic CHF, DM2, HTN, PAD, and Dementia who was brought to the ED via EMS due to symptoms of Increased Confusion, Slurring of her speech and a right facial droop noticed by her daughter at 3 pm during the day following her having an Angiogram and angioplasty of the Rt Lower Extremity by Interventional Radiology in short stay for an ischemic right foot. The history is per the daughter who reports that her mother's words were not making sense, and her speech was slurred. At baseline she has mild dementia and is on Namenda Rx but lives alone and is able to perform all of her ADLs Agent had significant agitation on admission, where she was given IV Ativan, so initially patient could not be appropriately evaluated, CT head did not show any acute findings x2 , MRI could not be done secondary to pacemaker,  patient continues to improve , where she is currently back to her baseline . Patient was evaluated by orthopedic, secondary to worsening great toe necrosis, the plan is for transtibial amputation giving her aggressive gangrene. Subjective:   Judith Zuniga had a good night sleep, with no significant events. Denies any chest pain, shortness of breath, headache, fever or chills. Assessment & Plan    Active Problems:    Hyperlipidemia   Benign hypertensive heart disease with heart failure   CAD, ARTERY BYPASS GRAFT   PACEMAKER, CRT-St. Jude   Long term current use of anticoagulant   Diastolic CHF, chronic   Atherosclerosis of native arteries of the extremities with ulceration   Delirium   Ischemic cardiomyopathy   Chronic atrial fibrillation   Diabetes mellitus   Systolic heart failure   Chronic systolic heart failure   DM (diabetes mellitus) type II uncontrolled, periph vascular disorder   HLD (hyperlipidemia)   Stroke  Acute encephalopathy, facial droop, slurred speech - Unclear if this is related to acute CVA  versus TIA or not, given unable to do MRI secondary to pacemaker, but this is most likely related to acute encephalopathy from medication and procedure, patient has no residual neurologic deficits. -  2-D echo EF of 55%, multiple valvular disease.,  - carotid Dopplers,40-59% right internal carotid artery stenosis by peak systolic velocity, 97-35% by ICA/CCA ratio, and 3-29% by end diastolic velocities. The left internal carotid artery exhbits 40-59% stenosis by peak systolic velocity, and 9-24% stenosis by end diastolic velocity and ICA/CCA ratio. Vertebral arteries are patent with antegrade flow. 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 - Vascular Procedure done by Interventional Radiology Dr. Laurence Ferrari 1/20 , status post  right lower extremity arteriogram and revascularization (PTA) and atherectomy of PT and atherectomy, and unsuccessful recanalization of AT. - Orthopedic consult appreciated, patient noted to have worsening of her ischemic changes, will need transtibial  Amputation , Tuesday or Wednesday as per order to.  Ischemic cardiomyopathy/CAD, ARTERY BYPASS GRAFT/Systolic CHF - On  Bisoprolol, Losartan, and Atrovastatin   Chronic atrial fibrillation - Currently  holding warfarin for possible need for surgical intervention, on heparin drip for anticoagulation pending surgery.    Diastolic CHF, chronic - On  Bisoprolol, and Losartan    Hyperlipidemia -On Atorvastatin Rx   Benign hypertensive heart disease with heart failure - On Bisoprolol, Losartan,  Rx   PACEMAKER, CRT-St. Jude- due to Complete Heart Block   Hypokalemia -Repleted, continue to monitor   DM2- -Hold Amaryl - SSI coverage PRN - Hemoglobin A1c 6.3      Code Status:Full Family Communication: None at bedside  Disposition Plan: Remains inpatient   Procedures  None   Consults  Neurology Orthopedic Interventional radiology  Medications  Scheduled Meds: . antiseptic oral rinse  7 mL Mouth Rinse q12n4p  . aspirin EC  81 mg Oral Daily  . atorvastatin  40 mg Oral Daily  . bisoprolol  10 mg Oral Daily  . chlorhexidine  15 mL Mouth Rinse BID  . dextrose  25 mL Intravenous Once  . insulin aspart  0-5 Units Subcutaneous QHS  . insulin aspart  0-9 Units Subcutaneous TID WC  . losartan  50 mg Oral Daily  . memantine  28 mg Oral Daily  . potassium chloride  10 mEq Oral Daily   Continuous Infusions: . sodium chloride 75 mL/hr at 05/05/14 1819  . heparin 650 Units/hr (05/09/14 1203)   PRN Meds:.acetaminophen, senna-docusate  DVT Prophylaxis  On Lovenox treatment dose  Lab Results  Component Value Date   PLT 169 05/08/2014    Antibiotics   Anti-infectives    None          Objective:   Filed Vitals:   05/09/14 0228 05/09/14 0543 05/09/14 0926 05/09/14 1351  BP: 136/61 146/58 145/56 132/54  Pulse: 77 78 76 69  Temp: 98.1 F (36.7 C) 98.2 F (36.8 C) 98 F (36.7 C) 99.1 F (37.3 C)  TempSrc: Oral Axillary Oral Oral  Resp: 20 20 20 20   Height:      Weight:      SpO2: 98% 99% 95% 99%    Wt Readings from Last 3 Encounters:  05/05/14 55.1 kg (121 lb 7.6 oz)  04/26/14 56.7 kg (125 lb)  12/15/13 56.7 kg (125  lb)     Intake/Output Summary (Last 24 hours) at 05/09/14 1441 Last data filed at 05/09/14 1254  Gross per 24 hour  Intake    120 ml  Output      0 ml  Net    120 ml     Physical Exam  Patient is awake, alert, confused, pleasant. [place].AT, Supple Neck,No JVD, No cervical lymphadenopathy appriciated.  Symmetrical Chest wall movement, Good air movement bilaterally, CTAB RRR,No Gallops,Rubs or new Murmurs, No Parasternal Heave +ve B.Sounds, Abd Soft, No tenderness, No organomegaly appriciated, No rebound - guarding or rigidity. No Cyanosis, Clubbing or edema, No new Rash or bruise  right leg dressing removed and patient having right lateral foot wound, necrotic.   Data Review   Micro Results No results found for this or any previous visit (from the past 240 hour(s)).  Radiology Reports No results found.  CBC  Recent  Labs Lab 05/04/14 0800 05/04/14 2127 05/04/14 2156 05/06/14 0500 05/07/14 0510 05/08/14 0353  WBC 8.9  --  8.4 6.3 8.4 9.5  HGB 10.4* 10.2* 9.5* 8.5* 9.0* 8.8*  HCT 32.0* 30.0* 28.8* 26.3* 27.6* 26.8*  PLT 188  --  159 152 161 169  MCV 92.8  --  93.2 93.3 93.6 93.1  MCH 30.1  --  30.7 30.1 30.5 30.6  MCHC 32.5  --  33.0 32.3 32.6 32.8  RDW 13.8  --  13.9 13.8 13.9 14.1  LYMPHSABS 0.8  --  0.7  --   --   --   MONOABS 0.6  --  0.6  --   --   --   EOSABS 0.0  --  0.0  --   --   --   BASOSABS 0.0  --  0.0  --   --   --     Chemistries   Recent Labs Lab 05/04/14 0800 05/04/14 2127 05/04/14 2156 05/06/14 0406 05/07/14 0510 05/08/14 0353  NA 140 142 142 146* 143 143  K 4.0 4.0 4.6 3.4* 4.0 3.8  CL 101 107 107 113* 110 110  CO2 29  --  27 24 23 24   GLUCOSE 180* 152* 149* 85 127* 122*  BUN 21 23 21 19 17 17   CREATININE 1.15* 1.10 1.21* 1.05 1.03 1.04  CALCIUM 9.0  --  8.7 8.5 8.7 8.4  AST  --   --  60*  --   --   --   ALT  --   --  23  --   --   --   ALKPHOS  --   --  69  --   --   --   BILITOT  --   --  1.5*  --   --   --     ------------------------------------------------------------------------------------------------------------------ estimated creatinine clearance is 27.9 mL/min (by C-G formula based on Cr of 1.04). ------------------------------------------------------------------------------------------------------------------ No results for input(s): HGBA1C in the last 72 hours. ------------------------------------------------------------------------------------------------------------------ No results for input(s): CHOL, HDL, LDLCALC, TRIG, CHOLHDL, LDLDIRECT in the last 72 hours. ------------------------------------------------------------------------------------------------------------------ No results for input(s): TSH, T4TOTAL, T3FREE, THYROIDAB in the last 72 hours.  Invalid input(s): FREET3 ------------------------------------------------------------------------------------------------------------------ No results for input(s): VITAMINB12, FOLATE, FERRITIN, TIBC, IRON, RETICCTPCT in the last 72 hours.  Coagulation profile  Recent Labs Lab 05/05/14 0514 05/06/14 0406 05/07/14 0510 05/08/14 0353 05/09/14 0453  INR 1.54* 1.45  1.56* 1.62* 1.69* 1.75*    No results for input(s): DDIMER in the last 72 hours.  Cardiac Enzymes No results for input(s): CKMB, TROPONINI, MYOGLOBIN in the last 168 hours.  Invalid input(s): CK ------------------------------------------------------------------------------------------------------------------ Invalid input(s): POCBNP     Time Spent in minutes   25 minutes    Christeen Lai M.D on 05/09/2014 at 2:41 PM  Between 7am to 7pm - Pager - [phone]  After 7pm go to [email or web address] - password [email or web address]  And look for the night coverage person covering for me after hours  [facility] Office  [phone]   **Disclaimer: This note may have been dictated with voice recognition software. Similar sounding words can inadvertently be  transcribed and this note may contain transcription errors which may not have been corrected upon publication of note.**

## 2014-05-09 NOTE — Progress Notes (Signed)
  Subjective: Pt with persistent confusion; intermittent RLE pain; eating very little per family  Objective: Vital signs in last 24 hours: Temp:  [97.5 F (36.4 C)-98.3 F (36.8 C)] 98.2 F (36.8 C) (01/25 0543) Pulse Rate:  [69-78] 78 (01/25 0543) Resp:  [20] 20 (01/25 0543) BP: (115-146)/(57-95) 146/58 mmHg (01/25 0543) SpO2:  [96 %-100 %] 99 % (01/25 0543) Last BM Date: 05/07/14  Intake/Output from previous day:   Intake/Output this shift:     pt awake but confused; moving all fours ok, left groin /CFA site clean, NT, small amt ecchymoses; rt foot bandaged , painful to touch, gangrenous changes noted  Lab Results:   Recent Labs  05/07/14 0510 05/08/14 0353  WBC 8.4 9.5  HGB 9.0* 8.8*  HCT 27.6* 26.8*  PLT 161 169   BMET  Recent Labs  05/07/14 0510 05/08/14 0353  NA 143 143  K 4.0 3.8  CL 110 110  CO2 23 24  GLUCOSE 127* 122*  BUN 17 17  CREATININE 1.03 1.04  CALCIUM 8.7 8.4   PT/INR  Recent Labs  05/08/14 0353 05/09/14 0453  LABPROT 20.0* 20.6*  INR 1.69* 1.75*   ABG No results for input(s): PHART, HCO3 in the last 72 hours.  Invalid input(s): PCO2, PO2  Studies/Results: No results found.  Anti-infectives: Anti-infectives    None      Assessment/Plan: Ischemic right great toe, s/p recent (R)LE arteriogram and revasc of PTA 1/20; postprocedure encephalopathy,?TIA; now with progressive ischemic changes/gangrene rt foot; only option- transtibial amputation per ortho tent this week  LOS: 5 days    ALLRED,D [facility] 05/09/2014

## 2014-05-09 NOTE — Progress Notes (Signed)
Physical Therapy Treatment Patient Details Name: Judith Zuniga MRN: [id number] DOB: 1921/02/09 Today's Date: 05/09/2014    History of Present Illness Patient is a 81 y/o female who is s/p angiography and revascularization of her RLE on 05/04/14. During the procedure the pt experienced agitation and confusion immediately following administration of the initial dose of conscious sedation and the procedure was completed with some difficulty. She did return to the ER later that evening / early the next morning with worsening aphasia, right facial droop and slurring. Ct head- (-). PMH of dementia, on chronic coumadin (this was held for five days recently for procedure on 05/04/2014--recent INR on 1/20 1.54), pacemker, CAD, DM.    PT Comments    Patient progressing slowly with mobility. Improved ambulation distance today with increased pain through right foot. Requires cues to keep UEs on RW for safety and Min A for balance. Pt continues to be appropriate for ST SNF at discharge. Per notes, pt may be going for a transtibial amputation this week. Will continue to follow acutely.   Follow Up Recommendations  SNF;Supervision/Assistance - 24 hour     Equipment Recommendations  None recommended by PT    Recommendations for Other Services       Precautions / Restrictions Precautions Precautions: Fall Restrictions Weight Bearing Restrictions: No Other Position/Activity Restrictions: No WB restrictions in chart relating to RLE s/p procedure.    Mobility  Bed Mobility Overal bed mobility: Needs Assistance Bed Mobility: Supine to Sit     Supine to sit: Supervision     General bed mobility comments: Supervision for safety. Use of rails for support.   Transfers Overall transfer level: Needs assistance Equipment used: Rolling walker (2 wheeled) Transfers: Sit to/from Stand Sit to Stand: Min assist         General transfer comment: Min A to rise from EOB with cues for hand placement.  Reluctant to place weight through right foot secondary to pain. Transferred to chair.  Stood from toilet x1, EOB x1.   Ambulation/Gait Ambulation/Gait assistance: Min assist Ambulation Distance (Feet): 15 Feet (x2 bouts) Assistive device: Rolling walker (2 wheeled) Gait Pattern/deviations: Step-to pattern;Decreased stride length;Decreased stance time - right;Decreased weight shift to right;Trunk flexed Gait velocity: slow   General Gait Details: Pt with slow, unsteady gait with difficulty placing weight through RLE secondary to pain. Min A for balance and RW negotiation. Cues to keep UEs on RW as pt with tendency to grab wall, counter for support.    Stairs            Wheelchair Mobility    Modified Rankin (Stroke Patients Only)       Balance Overall balance assessment: Needs assistance Sitting-balance support: Feet supported;No upper extremity supported Sitting balance-Leahy Scale: Fair     Standing balance support: During functional activity Standing balance-Leahy Scale: Fair Standing balance comment: Tolerates dynamic standing performing funcitonal tasks - hand washing and peri care with cues to use soap and where paper towels were. No LOB but unsteady requiring Min A to support.                    Cognition Arousal/Alertness: Awake/alert Behavior During Therapy: WFL for tasks assessed/performed Overall Cognitive Status: History of cognitive impairments - at baseline                      Exercises      General Comments        Pertinent Vitals/Pain Pain Assessment:  Faces Faces Pain Scale: Hurts little more Pain Location: right foot Pain Descriptors / Indicators: Burning Pain Intervention(s): Monitored during session;Limited activity within patient's tolerance;Repositioned    Home Living                      Prior Function            PT Goals (current goals can now be found in the care plan section) Progress towards PT goals:  Progressing toward goals    Frequency  Min 3X/week    PT Plan Current plan remains appropriate    Co-evaluation             End of Session Equipment Utilized During Treatment: Gait belt Activity Tolerance: Patient limited by pain Patient left: in chair;with call bell/phone within reach;with chair alarm set;with family/visitor present     Time: 7096-4383 PT Time Calculation (min) (ACUTE ONLY): 19 min  Charges:  $Gait Training: 8-22 mins                    G CodesCandy Sledge A Jun 04, 2014, 10:44 AM Candy Sledge, PT, DPT [phone]

## 2014-05-09 NOTE — Clinical Social Work Note (Signed)
CSW met with the pt's daughter Kaye at the bedside. CSW and Kaye discussed bed offers. Kaye expressed interested in [facility], [facility], and [facility]. CSW contacted Janie at [facility] to inform her of the family choice. CSW provided Janie with an update regarding the pt's anticipated surgery for Tuesday or Wednesday. CSW will continue to update Janie on the pt's condition after surgery to determine if Janie can accept the pt.     , MSW, LCSWA [phone] 

## 2014-05-09 NOTE — Progress Notes (Signed)
ANTICOAGULATION CONSULT NOTE - Initial Consult  Pharmacy Consult for Heparin  Indication: atrial fibrillation  No Known Allergies  Patient Measurements: Height: 5\' 2"  (157.5 cm) Weight: 121 lb 7.6 oz (55.1 kg) IBW/kg (Calculated) : 50.1 Heparin Dosing Weight: 55 kg   Vital Signs: Temp: 98 F (36.7 C) (01/25 0926) Temp Source: Oral (01/25 0926) BP: 145/56 mmHg (01/25 0926) Pulse Rate: 76 (01/25 0926)  Labs:  Recent Labs  05/07/14 0510 05/08/14 0353 05/09/14 0453  HGB 9.0* 8.8*  --   HCT 27.6* 26.8*  --   PLT 161 169  --   LABPROT 19.3* 20.0* 20.6*  INR 1.62* 1.69* 1.75*  CREATININE 1.03 1.04  --     Estimated Creatinine Clearance: 27.9 mL/min (by C-G formula based on Cr of 1.04).   Medical History: Past Medical History  Diagnosis Date  . Atrioventricular block, complete s/p AV ablation   . Pacemaker -CRT- [employer]   . Type II or unspecified type diabetes mellitus without mention of complication, not stated as uncontrolled   . CAD (coronary artery disease)     s/p redo bypass surgery in 1998 with patent graft in Jan. 2009.   . Ischemic cardiomyopathy     ischemic heart myopathy , ejection fraction of 25%  . Systolic heart failure     class II and euvolemic  . Permanent atrial fibrillation   . Hyperlipidemia   . Diabetes mellitus   . Premature ventricular contractions     frequent  . Elevated liver function tests     on amiodarone  . Hypertension   . Dementia     Medications:  Prescriptions prior to admission  Medication Sig Dispense Refill Last Dose  . atorvastatin (LIPITOR) 40 MG tablet Take 40 mg by mouth daily.    05/03/2014 at Unknown time  . bisoprolol (ZEBETA) 10 MG tablet Take 1 tablet (10 mg total) by mouth daily. 30 tablet 6 05/03/2014 at 1000  . furosemide (LASIX) 40 MG tablet TAKE1/2 TABLET BY MOUTH EVERY DAY 30 tablet 1 05/03/2014 at Unknown time  . glimepiride (AMARYL) 1 MG tablet Take 1 mg by mouth daily with breakfast.    05/03/2014 at  Unknown time  . HYDROcodone-acetaminophen (NORCO/VICODIN) 5-325 MG per tablet Take 1 tablet by mouth every 6 (six) hours as needed for moderate pain.   05/04/2014 at Unknown time  . losartan (COZAAR) 50 MG tablet Take 50 mg by mouth daily.    05/03/2014 at Unknown time  . Memantine HCl ER (NAMENDA XR) 28 MG CP24 Take 1 capsule by mouth daily.   05/03/2014 at Unknown time  . nitroGLYCERIN (NITRODUR - DOSED IN MG/24 HR) 0.2 mg/hr patch Place 0.2 mg onto the skin daily.   05/03/2014 at Unknown time  . pentoxifylline (TRENTAL) 400 MG CR tablet Take 400 mg by mouth 3 (three) times daily with meals.   05/03/2014 at Unknown time  . potassium chloride (K-DUR) 10 MEQ tablet TAKE ONE TABLET BY MOUTH ONCE DAILY 90 tablet 1 05/03/2014 at Unknown time  . warfarin (COUMADIN) 5 MG tablet Take 1 tablet (5 mg total) by mouth as directed. (Patient taking differently: Take 5-7.5 mg by mouth daily. 5mg  daily except for on Wednesday take 7.5mg ) 100 tablet 0 Past Week at Unknown time    Assessment: 21 YOF with /ho atrial fibrillation on Coumadin PTA. Currently on Lovenox bridge in anticipation of transtibial amputation for ischemic foot either on Tuesday or Wednesday. Pharmacy was consulted by internal medicine to switch from  Lovenox to heparin infusion. Last dose of Lovenox was given yesterday at ~10 AM. It has now been 24 hours past the last dose. INR today remains elevated at 1.75. H/H remains low at 8.8/26.8, Plt stable.   Goal of Therapy:  Heparin level 0.3-0.7 units/ml Monitor platelets by anticoagulation protocol: Yes   Plan:  Initiate heparin infusion at 650 units/hr. NO BOLUS F/u 6 hr HL at 1700  Monitor daily HL, CBC and s/s of bleeding   Albertina Parr, PharmD., BCPS Clinical Pharmacist Pager [phone]

## 2014-05-09 NOTE — Progress Notes (Signed)
Speech Language Pathology Treatment: Cognitive-Linquistic  Patient Details Name: Judith Zuniga MRN: [id number] DOB: 10-11-1920 Today's Date: 05/09/2014 Time: 1135-1201 SLP Time Calculation (min) (ACUTE ONLY): 26 min  Assessment / Plan / Recommendation Clinical Impression  Pt seen for f/u cognitive-linguistic treatment with daughter present for session today. SLP provided Max cues for orientation to time and situation, although today she is oriented to location. Throughout structured task, pt required Mod-Max cues for comprehension of mildly complex instructions and Max cues for working memory. After watching session, daughter reports that the patient is currently at her cognitive-linguistic baseline. SLP reviewed current level of function and recommendations for 24/7 supervision upon d/c (plan is for SNF), and daughter is in agreement. At this time, she chooses to hold acute SLP services as pt is at her baseline.    HPI HPI: Patient is a 81 y/o female who is s/p angiography and revascularization of her RLE on 05/04/14. During the procedure the pt experienced agitation and confusion immediately following administration of the initial dose of conscious sedation and the procedure was completed with some difficulty. She did return to the ER later that evening / early the next morning with worsening aphasia, right facial droop and slurring. Ct head- (-). PMH of dementia, on chronic coumadin (this was held for five days recently for procedure on 05/04/2014--recent INR on 1/20 1.54), pacemker, CAD, DM.    Pertinent Vitals Pain Assessment: Faces Faces Pain Scale: No hurt Pain Location: right foot Pain Descriptors / Indicators: Burning Pain Intervention(s): Monitored during session;Limited activity within patient's tolerance;Repositioned  SLP Plan  Discharge SLP treatment due to (comment) (per family, pt is at her cognitive-linguistic baseline)    Recommendations      Oral Care Recommendations: Oral care  BID Follow up Recommendations: Skilled Nursing facility Plan: Discharge SLP treatment due to (comment) (per family, pt is at her cognitive-linguistic baseline)    GO     [name], M.A. CCC-SLP [phone]  [name] 05/09/2014, 1:16 PM

## 2014-05-09 NOTE — Progress Notes (Signed)
UR COMPLETED  

## 2014-05-10 LAB — GLUCOSE, CAPILLARY
GLUCOSE-CAPILLARY: 220 mg/dL — AB (ref 70–99)
Glucose-Capillary: 118 mg/dL — ABNORMAL HIGH (ref 70–99)
Glucose-Capillary: 129 mg/dL — ABNORMAL HIGH (ref 70–99)
Glucose-Capillary: 175 mg/dL — ABNORMAL HIGH (ref 70–99)

## 2014-05-10 LAB — CBC
HCT: 27.1 % — ABNORMAL LOW (ref 36.0–46.0)
Hemoglobin: 8.8 g/dL — ABNORMAL LOW (ref 12.0–15.0)
MCH: 30.8 pg (ref 26.0–34.0)
MCHC: 32.5 g/dL (ref 30.0–36.0)
MCV: 94.8 fL (ref 78.0–100.0)
Platelets: 177 10*3/uL (ref 150–400)
RBC: 2.86 MIL/uL — ABNORMAL LOW (ref 3.87–5.11)
RDW: 14.6 % (ref 11.5–15.5)
WBC: 9.5 10*3/uL (ref 4.0–10.5)

## 2014-05-10 LAB — PROTIME-INR
INR: 1.5 — AB (ref 0.00–1.49)
Prothrombin Time: 18.3 seconds — ABNORMAL HIGH (ref 11.6–15.2)

## 2014-05-10 LAB — HEPARIN LEVEL (UNFRACTIONATED): Heparin Unfractionated: 0.22 IU/mL — ABNORMAL LOW (ref 0.30–0.70)

## 2014-05-10 MED ORDER — HYDROCODONE-ACETAMINOPHEN 5-325 MG PO TABS
1.0000 | ORAL_TABLET | ORAL | Status: DC | PRN
  Administered 2014-05-10 – 2014-05-12 (×4): 1 via ORAL
  Filled 2014-05-10 (×5): qty 1

## 2014-05-10 MED ORDER — ENSURE COMPLETE PO LIQD
237.0000 mL | Freq: Three times a day (TID) | ORAL | Status: DC
  Administered 2014-05-10 – 2014-05-14 (×9): 237 mL via ORAL

## 2014-05-10 MED ORDER — MORPHINE SULFATE 2 MG/ML IJ SOLN: 1.0000 mg | Freq: Once | INTRAMUSCULAR | Status: DC

## 2014-05-10 MED ORDER — PRO-STAT SUGAR FREE PO LIQD
30.0000 mL | Freq: Three times a day (TID) | ORAL | Status: DC
  Administered 2014-05-10 – 2014-05-14 (×10): 30 mL via ORAL
  Filled 2014-05-10 (×18): qty 30

## 2014-05-10 NOTE — Progress Notes (Signed)
ANTICOAGULATION CONSULT NOTE - Follow Up Consult  Pharmacy Consult for Heparin  Indication: atrial fibrillation  No Known Allergies  Patient Measurements: Height: 5\' 2"  (157.5 cm) Weight: 121 lb 7.6 oz (55.1 kg) IBW/kg (Calculated) : 50.1 Heparin Dosing Weight: 55 kg   Vital Signs: Temp: 98.6 F (37 C) (01/26 0618) Temp Source: Oral (01/26 0618) BP: 143/58 mmHg (01/26 0618) Pulse Rate: 76 (01/26 0618)  Labs:  Recent Labs  05/08/14 0353 05/09/14 0453 05/09/14 1615 05/10/14 0535  HGB 8.8*  --   --  8.8*  HCT 26.8*  --   --  27.1*  PLT 169  --   --  177  LABPROT 20.0* 20.6*  --  18.3*  INR 1.69* 1.75*  --  1.50*  HEPARINUNFRC  --   --  <0.10*  --   CREATININE 1.04  --   --   --     Estimated Creatinine Clearance: 27.9 mL/min (by C-G formula based on Cr of 1.04).   Medical History: Past Medical History  Diagnosis Date  . Atrioventricular block, complete s/p AV ablation   . Pacemaker -CRT- [employer] [facility]   . Type II or unspecified type diabetes mellitus without mention of complication, not stated as uncontrolled   . CAD (coronary artery disease)     s/p redo bypass surgery in 1998 with patent graft in Jan. 2009.   . Ischemic cardiomyopathy     ischemic heart myopathy , ejection fraction of 25%  . Systolic heart failure     class II and euvolemic  . Permanent atrial fibrillation   . Hyperlipidemia   . Diabetes mellitus   . Premature ventricular contractions     frequent  . Elevated liver function tests     on amiodarone  . Hypertension   . Dementia     Medications:  Prescriptions prior to admission  Medication Sig Dispense Refill Last Dose  . atorvastatin (LIPITOR) 40 MG tablet Take 40 mg by mouth daily.    05/03/2014 at Unknown time  . bisoprolol (ZEBETA) 10 MG tablet Take 1 tablet (10 mg total) by mouth daily. 30 tablet 6 05/03/2014 at 1000  . furosemide (LASIX) 40 MG tablet TAKE1/2 TABLET BY MOUTH EVERY DAY 30 tablet 1 05/03/2014 at Unknown time  . glimepiride  (AMARYL) 1 MG tablet Take 1 mg by mouth daily with breakfast.    05/03/2014 at Unknown time  . HYDROcodone-acetaminophen (NORCO/VICODIN) 5-325 MG per tablet Take 1 tablet by mouth every 6 (six) hours as needed for moderate pain.   05/04/2014 at Unknown time  . losartan (COZAAR) 50 MG tablet Take 50 mg by mouth daily.    05/03/2014 at Unknown time  . Memantine HCl ER (NAMENDA XR) 28 MG CP24 Take 1 capsule by mouth daily.   05/03/2014 at Unknown time  . nitroGLYCERIN (NITRODUR - DOSED IN MG/24 HR) 0.2 mg/hr patch Place 0.2 mg onto the skin daily.   05/03/2014 at Unknown time  . pentoxifylline (TRENTAL) 400 MG CR tablet Take 400 mg by mouth 3 (three) times daily with meals.   05/03/2014 at Unknown time  . potassium chloride (K-DUR) 10 MEQ tablet TAKE ONE TABLET BY MOUTH ONCE DAILY 90 tablet 1 05/03/2014 at Unknown time  . warfarin (COUMADIN) 5 MG tablet Take 1 tablet (5 mg total) by mouth as directed. (Patient taking differently: Take 5-7.5 mg by mouth daily. 5mg  daily except for on Wednesday take 7.5mg ) 100 tablet 0 Past Week at Unknown time    Assessment:  71 YOF with /ho atrial fibrillation on Coumadin PTA. Currently on Lovenox bridge in anticipation of transtibial amputation for ischemic foot either on Tuesday or Wednesday. Pharmacy was consulted by internal medicine to switch from Lovenox to heparin infusion. First HL was undetectable. H/H remains relatively stable. INR down to 1.5. No s/s of bleeding noted   Goal of Therapy:  Heparin level 0.3-0.7 units/ml Monitor platelets by anticoagulation protocol: Yes   Plan:  Increase heparin infusion at 850 units/hr. NO BOLUS F/u 6 hr HL at 1600 Monitor daily HL, CBC and s/s of bleeding   Judith Zuniga, PharmD., BCPS Clinical Pharmacist Pager [phone]

## 2014-05-10 NOTE — Progress Notes (Addendum)
Patient Demographics  Judith Zuniga, is a 81 y.o. female, DOB - 10/29/1920, CMK:[id number]  Admit date - 05/04/2014   Admitting Physician Theressa Millard, MD  Outpatient Primary MD for the patient is Jani Gravel, MD  LOS - 6   Chief Complaint  Patient presents with  . Extremity Weakness  . Stroke Symptoms       Admission history of present illness/brief narrative: Judith Zuniga is a 81 y.o. female with a history of CAD/Ischemic Cardiomyopathy, Complete AV Block S/P St Jude Biventricular Pacemaker, Chronic Atrial Fibrillation on Coumadin Rx, Systolic CHF, DM2, HTN, PAD, and Dementia who was brought to the ED via EMS due to symptoms of Increased Confusion, Slurring of her speech and a right facial droop noticed by her daughter at 3 pm during the day following her having an Angiogram and angioplasty of the Rt Lower Extremity by Interventional Radiology in short stay for an ischemic right foot. The history is per the daughter who reports that her mother's words were not making sense, and her speech was slurred. At baseline she has mild dementia and is on Namenda Rx but lives alone and is able to perform all of her ADLs Agent had significant agitation on admission, where she was given IV Ativan, so initially patient could not be appropriately evaluated, CT head did not show any acute findings x2 , MRI could not be done secondary to pacemaker,  patient continues to improve , where she is currently back to her baseline . Patient was evaluated by orthopedic, secondary to worsening great toe necrosis, the plan is for transtibial amputation giving her progressive gangrene.  Subjective:   [name] had a good night sleep, with no significant events. Denies any chest pain, shortness of breath, headache, fever or chills. Assessment & Plan    Active Problems:  Hyperlipidemia   Benign hypertensive heart disease with heart failure   CAD, ARTERY BYPASS GRAFT   PACEMAKER, CRT-St. Jude   Long term current use of anticoagulant   Diastolic CHF, chronic   Atherosclerosis of native arteries of the extremities with ulceration   Delirium   Ischemic cardiomyopathy   Chronic atrial fibrillation   Diabetes mellitus   Systolic heart failure   Chronic systolic heart failure   DM (diabetes mellitus) type II uncontrolled, periph vascular disorder   HLD (hyperlipidemia)   Stroke  Acute encephalopathy, facial droop, slurred speech - Unclear if this is related to acute CVA  versus TIA or not, given unable to do MRI secondary to pacemaker, but this is most likely related to acute encephalopathy from medication and procedure, patient has no residual neurologic deficits. -  2-D echo EF of 55%, multiple valvular disease.,  - carotid Dopplers,40-59% right internal carotid artery stenosis by peak systolic velocity, 56-97% by ICA/CCA ratio, and 9-48% by end diastolic velocities. The left internal carotid artery exhbits 40-59% stenosis by peak systolic velocity, and 0-16% stenosis by end diastolic velocity and ICA/CCA ratio. Vertebral arteries are patent with antegrade flow. 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 - Vascular Procedure done by Interventional Radiology Dr. Laurence Ferrari 1/20 , status post right  lower extremity arteriogram and revascularization (PTA) and atherectomy of PT and atherectomy, and unsuccessful recanalization of AT. - Orthopedic consult appreciated, patient noted to have worsening of her ischemic changes, will need transtibial  Amputation ,will go for right BKA, 10:30 am tomorrow. - Does not appear to be infected, so no indication for antibiotics.  Ischemic cardiomyopathy/CAD, ARTERY BYPASS GRAFT/Systolic CHF - On  Bisoprolol,  Losartan, and Atrovastatin   Chronic atrial fibrillation - Currently holding warfarin for possible need for surgical intervention, on heparin drip for anticoagulation pending surgery.    Diastolic CHF, chronic - On  Bisoprolol, and Losartan    Hyperlipidemia -On Atorvastatin Rx   Benign hypertensive heart disease with heart failure - On Bisoprolol, Losartan,  Rx   PACEMAKER, CRT-St. Jude- due to Complete Heart Block   Hypokalemia -Repleted, continue to monitor   DM2- -Hold Amaryl - SSI coverage PRN - Hemoglobin A1c 6.3      Code Status:Full Family Communication: Daughter at bedside  Disposition Plan: Remains inpatient   Procedures  None   Consults  Neurology Orthopedic Interventional radiology  Medications  Scheduled Meds: . antiseptic oral rinse  7 mL Mouth Rinse q12n4p  . aspirin EC  81 mg Oral Daily  . atorvastatin  40 mg Oral Daily  . bisoprolol  10 mg Oral Daily  . chlorhexidine  15 mL Mouth Rinse BID  . dextrose  25 mL Intravenous Once  . feeding supplement (ENSURE COMPLETE)  237 mL Oral TID WC  . feeding supplement (PRO-STAT SUGAR FREE 64)  30 mL Oral TID WC  . insulin aspart  0-5 Units Subcutaneous QHS  . insulin aspart  0-9 Units Subcutaneous TID WC  . losartan  50 mg Oral Daily  . memantine  28 mg Oral Daily  . potassium chloride  10 mEq Oral Daily   Continuous Infusions: . sodium chloride 75 mL/hr at 05/05/14 1819  . heparin 850 Units/hr (05/10/14 0957)   PRN Meds:.acetaminophen, senna-docusate  DVT Prophylaxis  On heparin GTT Lab Results  Component Value Date   PLT 177 05/10/2014    Antibiotics   Anti-infectives    None          Objective:   Filed Vitals:   05/09/14 2230 05/10/14 0235 05/10/14 0618 05/10/14 0950  BP: 121/52 146/54 143/58 126/50  Pulse: 71 69 76 71  Temp: 98.9 F (37.2 C) 98.1 F (36.7 C) 98.6 F (37 C) 98.6 F (37 C)  TempSrc: Oral Oral Oral Oral  Resp: 20 20  20 20   Height:      Weight:      SpO2: 98% 97% 97% 96%    Wt Readings from Last 3 Encounters:  05/05/14 55.1 kg (121 lb 7.6 oz)  04/26/14 56.7 kg (125 lb)  12/15/13 56.7 kg (125 lb)     Intake/Output Summary (Last 24 hours) at 05/10/14 1127 Last data filed at 05/09/14 1254  Gross per 24 hour  Intake    120 ml  Output      0 ml  Net    120 ml     Physical Exam  Patient is awake, alert, confused, pleasant. [place].AT, Supple Neck,No JVD, No cervical lymphadenopathy appriciated.  Symmetrical Chest wall movement, Good air movement bilaterally, CTAB RRR,No Gallops,Rubs or new Murmurs, No Parasternal Heave +ve B.Sounds, Abd Soft, No tenderness, No organomegaly appriciated, No rebound - guarding or rigidity. No Cyanosis, Clubbing or edema, No new Rash or bruise  right leg dressing removed and patient having right lateral  foot wound, necrotic.   Data Review   Micro Results No results found for this or any previous visit (from the past 240 hour(s)).  Radiology Reports No results found.  CBC  Recent Labs Lab 05/04/14 0800  05/04/14 2156 05/06/14 0500 05/07/14 0510 05/08/14 0353 05/10/14 0535  WBC 8.9  --  8.4 6.3 8.4 9.5 9.5  HGB 10.4*  < > 9.5* 8.5* 9.0* 8.8* 8.8*  HCT 32.0*  < > 28.8* 26.3* 27.6* 26.8* 27.1*  PLT 188  --  159 152 161 169 177  MCV 92.8  --  93.2 93.3 93.6 93.1 94.8  MCH 30.1  --  30.7 30.1 30.5 30.6 30.8  MCHC 32.5  --  33.0 32.3 32.6 32.8 32.5  RDW 13.8  --  13.9 13.8 13.9 14.1 14.6  LYMPHSABS 0.8  --  0.7  --   --   --   --   MONOABS 0.6  --  0.6  --   --   --   --   EOSABS 0.0  --  0.0  --   --   --   --   BASOSABS 0.0  --  0.0  --   --   --   --   < > = values in this interval not displayed.  Chemistries   Recent Labs Lab 05/04/14 0800 05/04/14 2127 05/04/14 2156 05/06/14 0406 05/07/14 0510 05/08/14 0353  NA 140 142 142 146* 143 143  K 4.0 4.0 4.6 3.4* 4.0 3.8  CL 101 107 107 113* 110 110  CO2 29  --  27 24 23 24   GLUCOSE 180* 152*  149* 85 127* 122*  BUN 21 23 21 19 17 17   CREATININE 1.15* 1.10 1.21* 1.05 1.03 1.04  CALCIUM 9.0  --  8.7 8.5 8.7 8.4  AST  --   --  60*  --   --   --   ALT  --   --  23  --   --   --   ALKPHOS  --   --  69  --   --   --   BILITOT  --   --  1.5*  --   --   --    ------------------------------------------------------------------------------------------------------------------ estimated creatinine clearance is 27.9 mL/min (by C-G formula based on Cr of 1.04). ------------------------------------------------------------------------------------------------------------------ No results for input(s): HGBA1C in the last 72 hours. ------------------------------------------------------------------------------------------------------------------ No results for input(s): CHOL, HDL, LDLCALC, TRIG, CHOLHDL, LDLDIRECT in the last 72 hours. ------------------------------------------------------------------------------------------------------------------ No results for input(s): TSH, T4TOTAL, T3FREE, THYROIDAB in the last 72 hours.  Invalid input(s): FREET3 ------------------------------------------------------------------------------------------------------------------ No results for input(s): VITAMINB12, FOLATE, FERRITIN, TIBC, IRON, RETICCTPCT in the last 72 hours.  Coagulation profile  Recent Labs Lab 05/06/14 0406 05/07/14 0510 05/08/14 0353 05/09/14 0453 05/10/14 0535  INR 1.45  1.56* 1.62* 1.69* 1.75* 1.50*    No results for input(s): DDIMER in the last 72 hours.  Cardiac Enzymes No results for input(s): CKMB, TROPONINI, MYOGLOBIN in the last 168 hours.  Invalid input(s): CK ------------------------------------------------------------------------------------------------------------------ Invalid input(s): POCBNP     Time Spent in minutes   25 minutes    Monay Houlton M.D on 05/10/2014 at 11:27 AM  Between 7am to 7pm - Pager - [phone]  After 7pm go to  [email or web address] - password [email or web address]  And look for the night coverage person covering for me after hours  [facility] Office  [phone]   **Disclaimer: This note may have been dictated with voice recognition software. Similar  sounding words can inadvertently be transcribed and this note may contain transcription errors which may not have been corrected upon publication of note.**

## 2014-05-10 NOTE — Progress Notes (Signed)
ANTICOAGULATION CONSULT NOTE - Follow Up Consult  Pharmacy Consult for Heparin Indication: atrial fibrillation  No Known Allergies  Patient Measurements: Height: 5\' 2"  (157.5 cm) Weight: 121 lb 7.6 oz (55.1 kg) IBW/kg (Calculated) : 50.1 Heparin Dosing Weight: 55 kg  Vital Signs: Temp: 97.3 F (36.3 C) (01/26 1356) Temp Source: Oral (01/26 1356) BP: 116/46 mmHg (01/26 1356) Pulse Rate: 77 (01/26 1356)  Labs:  Recent Labs  05/08/14 0353 05/09/14 0453 05/09/14 1615 05/10/14 0535 05/10/14 1535  HGB 8.8*  --   --  8.8*  --   HCT 26.8*  --   --  27.1*  --   PLT 169  --   --  177  --   LABPROT 20.0* 20.6*  --  18.3*  --   INR 1.69* 1.75*  --  1.50*  --   HEPARINUNFRC  --   --  <0.10*  --  0.22*  CREATININE 1.04  --   --   --   --     Estimated Creatinine Clearance: 27.9 mL/min (by C-G formula based on Cr of 1.04).   Medications:  Heparin @ 850 units/hr  Assessment: Judith Zuniga on warfarin PTA for hx Afib now being bridged with heparin IV for anticipation of transtibial amputation for ischemic foot now planned for Wednesday AM. A heparin level this evening remains SUBtherapeutic though trending up.  Goal of Therapy:  Heparin level 0.3-0.5 units/ml Monitor platelets by anticoagulation protocol: Yes   Plan:  1. Increase heparin to 950 units/hr (9.5 ml/hr) 2. Will continue to monitor for any signs/symptoms of bleeding and will follow up with heparin level in the a.m.   Alycia Rossetti, PharmD, BCPS Clinical Pharmacist Pager: [phone] 05/10/2014 4:47 PM

## 2014-05-11 ENCOUNTER — Inpatient Hospital Stay (HOSPITAL_COMMUNITY): Payer: Medicare Other | Admitting: Anesthesiology

## 2014-05-11 ENCOUNTER — Encounter (HOSPITAL_COMMUNITY)
Admission: EM | Disposition: A | Discharge status: Skilled Nursing Facility | Payer: Self-pay | Source: Home / Self Care | Attending: Internal Medicine

## 2014-05-11 ENCOUNTER — Encounter (HOSPITAL_COMMUNITY): Payer: Self-pay | Admitting: Anesthesiology

## 2014-05-11 DIAGNOSIS — E1152 Type 2 diabetes mellitus with diabetic peripheral angiopathy with gangrene: Secondary | ICD-10-CM

## 2014-05-11 HISTORY — PX: AMPUTATION: SHX166

## 2014-05-11 LAB — BASIC METABOLIC PANEL
ANION GAP: 4 — AB (ref 5–15)
BUN: 19 mg/dL (ref 6–23)
CALCIUM: 8.1 mg/dL — AB (ref 8.4–10.5)
CO2: 26 mmol/L (ref 19–32)
CREATININE: 0.92 mg/dL (ref 0.50–1.10)
Chloride: 111 mmol/L (ref 96–112)
GFR calc Af Amer: 61 mL/min — ABNORMAL LOW (ref >90)
GFR calc non Af Amer: 53 mL/min — ABNORMAL LOW (ref >90)
Glucose, Bld: 150 mg/dL — ABNORMAL HIGH (ref 70–99)
POTASSIUM: 4.5 mmol/L (ref 3.5–5.1)
Sodium: 141 mmol/L (ref 135–145)

## 2014-05-11 LAB — CBC
HCT: 27.4 % — ABNORMAL LOW (ref 36.0–46.0)
Hemoglobin: 8.7 g/dL — ABNORMAL LOW (ref 12.0–15.0)
MCH: 31.1 pg (ref 26.0–34.0)
MCHC: 31.8 g/dL (ref 30.0–36.0)
MCV: 97.9 fL (ref 78.0–100.0)
PLATELETS: 165 10*3/uL (ref 150–400)
RBC: 2.8 MIL/uL — AB (ref 3.87–5.11)
RDW: 14.9 % (ref 11.5–15.5)
WBC: 8.8 10*3/uL (ref 4.0–10.5)

## 2014-05-11 LAB — MRSA PCR SCREENING: MRSA BY PCR: NEGATIVE

## 2014-05-11 LAB — HEPARIN LEVEL (UNFRACTIONATED): Heparin Unfractionated: <0.1 IU/mL — ABNORMAL LOW (ref 0.30–0.70)

## 2014-05-11 LAB — GLUCOSE, CAPILLARY
GLUCOSE-CAPILLARY: 100 mg/dL — AB (ref 70–99)
GLUCOSE-CAPILLARY: 113 mg/dL — AB (ref 70–99)
Glucose-Capillary: 125 mg/dL — ABNORMAL HIGH (ref 70–99)
Glucose-Capillary: 106 mg/dL — ABNORMAL HIGH (ref 70–99)
Glucose-Capillary: 160 mg/dL — ABNORMAL HIGH (ref 70–99)

## 2014-05-11 LAB — PROTIME-INR
INR: 1.32 (ref 0.00–1.49)
PROTHROMBIN TIME: 16.5 s — AB (ref 11.6–15.2)

## 2014-05-11 SURGERY — AMPUTATION BELOW KNEE
Anesthesia: General | Site: Leg Lower | Laterality: Right

## 2014-05-11 MED ORDER — LIDOCAINE HCL (CARDIAC) 20 MG/ML IV SOLN
INTRAVENOUS | Status: DC | PRN
  Administered 2014-05-11: 100 mg via INTRAVENOUS

## 2014-05-11 MED ORDER — HEPARIN (PORCINE) IN NACL 100-0.45 UNIT/ML-% IJ SOLN
1000.0000 [IU]/h | INTRAMUSCULAR | Status: AC
  Administered 2014-05-12: 950 [IU]/h via INTRAVENOUS
  Administered 2014-05-13 – 2014-05-14 (×2): 1000 [IU]/h via INTRAVENOUS
  Filled 2014-05-11 (×3): qty 250

## 2014-05-11 MED ORDER — ONDANSETRON HCL 4 MG/2ML IJ SOLN: 4.0000 mg | Freq: Once | INTRAMUSCULAR | Status: DC | PRN

## 2014-05-11 MED ORDER — FENTANYL CITRATE 0.05 MG/ML IJ SOLN
INTRAMUSCULAR | Status: AC
  Filled 2014-05-11: qty 5

## 2014-05-11 MED ORDER — ONDANSETRON HCL 4 MG/2ML IJ SOLN
INTRAMUSCULAR | Status: DC | PRN
  Administered 2014-05-11: 4 mg via INTRAVENOUS

## 2014-05-11 MED ORDER — ARTIFICIAL TEARS OP OINT
TOPICAL_OINTMENT | OPHTHALMIC | Status: DC | PRN
  Administered 2014-05-11: 1 via OPHTHALMIC

## 2014-05-11 MED ORDER — FENTANYL CITRATE 0.05 MG/ML IJ SOLN
INTRAMUSCULAR | Status: DC | PRN
  Administered 2014-05-11: 100 ug via INTRAVENOUS

## 2014-05-11 MED ORDER — 0.9 % SODIUM CHLORIDE (POUR BTL) OPTIME
TOPICAL | Status: DC | PRN
  Administered 2014-05-11: 1000 mL

## 2014-05-11 MED ORDER — LACTATED RINGERS IV SOLN: INTRAVENOUS | Status: DC

## 2014-05-11 MED ORDER — LACTATED RINGERS IV SOLN
INTRAVENOUS | Status: DC | PRN
  Administered 2014-05-11: 11:00:00 via INTRAVENOUS

## 2014-05-11 MED ORDER — PROPOFOL 10 MG/ML IV BOLUS
INTRAVENOUS | Status: AC
  Filled 2014-05-11: qty 20

## 2014-05-11 MED ORDER — CEFAZOLIN SODIUM 1-5 GM-% IV SOLN
INTRAVENOUS | Status: DC | PRN
  Administered 2014-05-11: 1 g via INTRAVENOUS

## 2014-05-11 MED ORDER — HYDROMORPHONE HCL 1 MG/ML IJ SOLN: 0.2500 mg | INTRAMUSCULAR | Status: DC | PRN

## 2014-05-11 MED ORDER — FENTANYL CITRATE 0.05 MG/ML IJ SOLN
INTRAMUSCULAR | Status: AC
  Filled 2014-05-11: qty 2

## 2014-05-11 MED ORDER — PROPOFOL 10 MG/ML IV BOLUS
INTRAVENOUS | Status: DC | PRN
  Administered 2014-05-11: 90 mg via INTRAVENOUS

## 2014-05-11 SURGICAL SUPPLY — 38 items
BLADE SAW RECIP 87.9 MT (BLADE) ×2 IMPLANT
BLADE SURG 21 STRL SS (BLADE) ×2 IMPLANT
BNDG COHESIVE 6X5 TAN STRL LF (GAUZE/BANDAGES/DRESSINGS) ×4 IMPLANT
BNDG GAUZE ELAST 4 BULKY (GAUZE/BANDAGES/DRESSINGS) ×4 IMPLANT
CANISTER SUCTION 2500CC (MISCELLANEOUS) ×1 IMPLANT
COVER SURGICAL LIGHT HANDLE (MISCELLANEOUS) ×2 IMPLANT
CUFF TOURNIQUET SINGLE 34IN LL (TOURNIQUET CUFF) ×1 IMPLANT
CUFF TOURNIQUET SINGLE 44IN (TOURNIQUET CUFF) IMPLANT
DRAPE EXTREMITY T 121X128X90 (DRAPE) ×2 IMPLANT
DRAPE PROXIMA HALF (DRAPES) ×4 IMPLANT
DRAPE U-SHAPE 47X51 STRL (DRAPES) ×2 IMPLANT
DRSG ADAPTIC 3X8 NADH LF (GAUZE/BANDAGES/DRESSINGS) ×2 IMPLANT
DRSG PAD ABDOMINAL 8X10 ST (GAUZE/BANDAGES/DRESSINGS) ×3 IMPLANT
DURAPREP 26ML APPLICATOR (WOUND CARE) ×2 IMPLANT
ELECT REM PT RETURN 9FT ADLT (ELECTROSURGICAL) ×2
ELECTRODE REM PT RTRN 9FT ADLT (ELECTROSURGICAL) ×1 IMPLANT
GAUZE SPONGE 4X4 12PLY STRL (GAUZE/BANDAGES/DRESSINGS) ×2 IMPLANT
GLOVE BIOGEL PI IND STRL 9 (GLOVE) ×1 IMPLANT
GLOVE BIOGEL PI INDICATOR 9 (GLOVE) ×1
GLOVE SURG ORTHO 9.0 STRL STRW (GLOVE) ×2 IMPLANT
GOWN STRL REUS W/ TWL XL LVL3 (GOWN DISPOSABLE) ×2 IMPLANT
GOWN STRL REUS W/TWL XL LVL3 (GOWN DISPOSABLE) ×4
KIT BASIN OR (CUSTOM PROCEDURE TRAY) ×2 IMPLANT
KIT ROOM TURNOVER OR (KITS) ×2 IMPLANT
MANIFOLD NEPTUNE II (INSTRUMENTS) ×1 IMPLANT
NS IRRIG 1000ML POUR BTL (IV SOLUTION) ×2 IMPLANT
PACK GENERAL/GYN (CUSTOM PROCEDURE TRAY) ×2 IMPLANT
PAD ARMBOARD 7.5X6 YLW CONV (MISCELLANEOUS) ×4 IMPLANT
SPONGE LAP 18X18 X RAY DECT (DISPOSABLE) IMPLANT
STAPLER VISISTAT 35W (STAPLE) IMPLANT
STOCKINETTE IMPERVIOUS LG (DRAPES) ×2 IMPLANT
SUT ETHILON 2 0 PSLX (SUTURE) ×2 IMPLANT
SUT SILK 2 0 (SUTURE) ×2
SUT SILK 2-0 18XBRD TIE 12 (SUTURE) ×1 IMPLANT
SUT VIC AB 1 CTX 27 (SUTURE) ×2 IMPLANT
TOWEL OR 17X24 6PK STRL BLUE (TOWEL DISPOSABLE) ×2 IMPLANT
TOWEL OR 17X26 10 PK STRL BLUE (TOWEL DISPOSABLE) ×2 IMPLANT
WATER STERILE IRR 1000ML POUR (IV SOLUTION) ×1 IMPLANT

## 2014-05-11 NOTE — Anesthesia Preprocedure Evaluation (Addendum)
Anesthesia Evaluation  Patient identified by MRN, date of birth, ID band Patient confused    Reviewed: Patient's Chart, lab work & pertinent test results, Unable to perform ROS - Chart review only  Airway        Dental   Pulmonary          Cardiovascular hypertension, + CAD, + CABG, + Peripheral Vascular Disease and +CHF + dysrhythmias + pacemaker     Neuro/Psych CVA    GI/Hepatic   Endo/Other  diabetes, Type 2, Oral Hypoglycemic Agents  Renal/GU      Musculoskeletal   Abdominal   Peds  Hematology   Anesthesia Other Findings Dementia  Reproductive/Obstetrics                            Anesthesia Physical Anesthesia Plan  ASA: IV  Anesthesia Plan: General   Post-op Pain Management:    Induction: Intravenous  Airway Management Planned: LMA and Oral ETT  Additional Equipment:   Intra-op Plan:   Post-operative Plan: Extubation in OR  Informed Consent: I have reviewed the patients History and Physical, chart, labs and discussed the procedure including the risks, benefits and alternatives for the proposed anesthesia with the patient or authorized representative who has indicated his/her understanding and acceptance.     Plan Discussed with: Anesthesiologist, CRNA and Surgeon  Anesthesia Plan Comments:         Anesthesia Quick Evaluation

## 2014-05-11 NOTE — Clinical Social Work Note (Signed)
CSW contacted Janie at [facility] to inform her that the pt's had surgery today. CSW informed [facility] that PT/OT needs to assess the pt. CSW will continue to update Janie on the pt's condition to determine if [facility] can accept the pt.   [place], MSW, [place]

## 2014-05-11 NOTE — Progress Notes (Signed)
PT Cancellation Note  Patient Details Name: Judith Zuniga MRN: [id number] DOB: 1921-03-23   Cancelled Treatment:    Reason Eval/Treat Not Completed: Patient not medically ready Pt to OR today for a BKA. Will follow up on 1/28 for a re-evaluation post surgery to determine appropriate disposition.   Candy Sledge A 05/11/2014, 8:49 AM Candy Sledge, PT, DPT [phone]

## 2014-05-11 NOTE — H&P (View-Only) (Signed)
Patient ID: Judith Zuniga, female   DOB: [birth year], 81 y.o.   MRN: [id number] Patient is status post revascularization with [facility] imaging to the right lower extremity. She does not have flow through the anterior tibial artery healing well was through the posterior tibial artery. She did have an open arch. Examination this time she has dry gangrene of the entire first ray with black ischemic changes of the lesser toes as well. There is ischemic changes of the foot going all the way up to the hindfoot. Assessment rapidly progressive ischemic changes to the right foot status post revascularization with [facility] imaging.  Plan: I discussed with the patient's daughter the only option would be a transtibial amputation. There are no foot salvage options at this time. Patient will need to go to a rehabilitation facility with memory care. Anticipate surgery Tuesday or Wednesday. Patient may be discharged a day or 2 after surgery.

## 2014-05-11 NOTE — Progress Notes (Signed)
Report called to [facility] in [place].

## 2014-05-11 NOTE — Progress Notes (Signed)
ANTICOAGULATION CONSULT NOTE   Pharmacy Consult for Heparin Indication: atrial fibrillation  No Known Allergies  Patient Measurements: Height: 5\' 2"  (157.5 cm) Weight: 121 lb 7.6 oz (55.1 kg) IBW/kg (Calculated) : 50.1 Heparin Dosing Weight: 55 kg  Vital Signs: Temp: 98.1 F (36.7 C) (01/27 0234) Temp Source: Oral (01/27 0234) BP: 127/53 mmHg (01/27 0234) Pulse Rate: 78 (01/27 0234)  Labs:  Recent Labs  05/08/14 0353 05/09/14 0453 05/09/14 1615 05/10/14 0535 05/10/14 1535 05/11/14 0134  HGB 8.8*  --   --  8.8*  --  8.7*  HCT 26.8*  --   --  27.1*  --  27.4*  PLT 169  --   --  177  --  165  LABPROT 20.0* 20.6*  --  18.3*  --  16.5*  INR 1.69* 1.75*  --  1.50*  --  1.32  HEPARINUNFRC  --   --  <0.10*  --  0.22* <0.10*  CREATININE 1.04  --   --   --   --  0.92    Estimated Creatinine Clearance: 31.5 mL/min (by C-G formula based on Cr of 0.92).  Assessment: 81 YO Female with h/o AFib Coumadin on hold, for heparin.  Heparin held since 1930 in anticipation of surgery today.  Goal of Therapy:  Heparin level 0.3-0.5 units/ml Monitor platelets by anticoagulation protocol: Yes   Plan:  F/U after OR this morning  Phillis Knack, PharmD, BCPS  05/11/2014 2:47 AM

## 2014-05-11 NOTE — Anesthesia Procedure Notes (Addendum)
Anesthesia Regional Block:  Adductor canal block  Pre-Anesthetic Checklist: ,, timeout performed, Correct Patient, Correct Site, Correct Laterality, Correct Procedure, Correct Position, site marked, Risks and benefits discussed,  Surgical consent,  Pre-op evaluation,  At surgeon's request and post-op pain management  Laterality: Right  Prep: chloraprep and alcohol swabs       Needles:  Injection technique: Single-shot  Needle Type: Stimulator Needle - 80        Needle insertion depth: 7 cm   Additional Needles:  Procedures: Doppler guided Adductor canal block Narrative:  Start time: 05/11/2014 10:05 AM End time: 05/11/2014 10:10 AM Injection made incrementally with aspirations every 5 mL.  Performed by: Personally  Anesthesiologist: Kate Sable  Additional Notes: Pt's daughter accepts procedure w/ risks. ( Pt has dementia )  18cc 0.5% Marcaine w/ epi w/o difficulty or discomfort. GES   Procedure Name: LMA Insertion Performed by: Scheryl Darter Pre-anesthesia Checklist: Patient identified, Emergency Drugs available, Suction available, Patient being monitored and Timeout performed Patient Re-evaluated:Patient Re-evaluated prior to inductionOxygen Delivery Method: Circle system utilized Preoxygenation: Pre-oxygenation with 100% oxygen Intubation Type: IV induction Ventilation: Mask ventilation without difficulty LMA: LMA inserted LMA Size: 4.0 Number of attempts: 1 Placement Confirmation: positive ETCO2 and breath sounds checked- equal and bilateral Tube secured with: Tape Dental Injury: Teeth and Oropharynx as per pre-operative assessment

## 2014-05-11 NOTE — Progress Notes (Signed)
Occupational Therapy Cancellation Note  OT cancelled this am due to pt in surgery for BKA.  Will follow up tomorrow.  Lucille Passy, OTR/L [phone]

## 2014-05-11 NOTE — Interval H&P Note (Signed)
History and Physical Interval Note:  05/11/2014 6:29 AM  Judith Zuniga  has presented today for surgery, with the diagnosis of Right Foot Gangrene  The various methods of treatment have been discussed with the patient and family. After consideration of risks, benefits and other options for treatment, the patient has consented to  Procedure(s): AMPUTATION BELOW KNEE (Right) as a surgical intervention .  The patient's history has been reviewed, patient examined, no change in status, stable for surgery.  I have reviewed the patient's chart and labs.  Questions were answered to the patient's satisfaction.     DUDA,MARCUS V

## 2014-05-11 NOTE — Progress Notes (Signed)
Patient returned fron surgery. She is alert and oriented to base line. Stump covered with dressing and is clean and dry. Family at bed side. Will continue to monitor.

## 2014-05-11 NOTE — Progress Notes (Signed)
PROGRESS NOTE  Judith Zuniga RXV:[id number] DOB: 08-31-1920 DOA: 05/04/2014 PCP: Jani Gravel, MD  HPI: Judith Zuniga is a 81 y.o. female with a history of CAD/Ischemic Cardiomyopathy, Complete AV Block S/P St Jude Biventricular Pacemaker, Chronic Atrial Fibrillation on Coumadin Rx, Systolic CHF, DM2, HTN, PAD, and Dementia who was brought to the ED via EMS due to symptoms of Increased Confusion, Slurring of her speech and a right facial droop noticed by her daughter at 3 pm during the day following her having an Angiogram and angioplasty of the Rt Lower Extremity by Interventional Radiology in short stay for an ischemic right foot. Patient was evaluated by orthopedic, secondary to worsening great toe necrosis, the plan is for transtibial amputation giving her progressive gangrene.  Subjective / 24 H Interval events No complaints this morning, no chest pain, SOB, no abdominal pain, nausea or vomiting  Assessment/Plan: Active Problems:   Hyperlipidemia   Benign hypertensive heart disease with heart failure   CAD, ARTERY BYPASS GRAFT   PACEMAKER, CRT-St. Jude   Long term current use of anticoagulant   Diastolic CHF, chronic   Atherosclerosis of native arteries of the extremities with ulceration   Delirium   Ischemic cardiomyopathy   Chronic atrial fibrillation   Diabetes mellitus   Systolic heart failure   Chronic systolic heart failure   DM (diabetes mellitus) type II uncontrolled, periph vascular disorder   HLD (hyperlipidemia)   Stroke  Acute encephalopathy, facial droop, slurred speech - Unclear if this is related to acute CVA versus TIA or not, given unable to do MRI secondary to pacemaker, but this is most likely related to acute encephalopathy from medication and procedure, patient has no residual neurologic deficits. - 2-D echo EF of 55%, multiple valvular disease.,  - carotid Dopplers,40-59% right internal carotid artery stenosis by peak systolic velocity, 50-93% by ICA/CCA  ratio, and 2-67% by end diastolic velocities. The left internal carotid artery exhbits 40-59% stenosis by peak systolic velocity, and 1-24% stenosis by end diastolic velocity and ICA/CCA ratio. Vertebral arteries are patent with antegrade flow. - hemoglobin A1c is 6.3 - Continue with aspirin and statin -LDL 63  Atherosclerosis of native arteries of the extremities with ulceration and gangrene. - Vascular Procedure done by Interventional Radiology Dr. Laurence Ferrari 1/20 , status post right lower extremity arteriogram and revascularization (PTA) and atherectomy of PT and atherectomy, and unsuccessful recanalization of AT. - Orthopedic consult appreciated, patient noted to have worsening of her ischemic changes, transtibial Amputation 1/27 - Does not appear to be infected, so no indication for antibiotics.  Ischemic cardiomyopathy/CAD, ARTERY BYPASS GRAFT/Systolic CHF - On Bisoprolol, Losartan, and Atrovastatin  Chronic atrial fibrillation - Currently holding warfarin for surgery, restart per ortho  Diastolic CHF, chronic- On Bisoprolol, and Losartan  Hyperlipidemia -On Atorvastatin Rx  Benign hypertensive heart disease with heart failure - On Bisoprolol, Losartan, Rx  PACEMAKER, CRT-St. Jude- due to Complete Heart Block  Hypokalemia -Repleted, continue to monitor  DM2 - Hold Amaryl - SSI coverage PRN - Hemoglobin A1c 6.3   Diet: Diet Carb Modified Fluids: NS DVT Prophylaxis: Heparin  Code Status: Full Code Family Communication: d/w daughter bedside  Disposition Plan: remain intpatient  Consultants:  Orthopedic surgery   Neurology   Procedures:  Transtibial amputation 1/27   Antibiotics  Anti-infectives    None      Studies  No results found.  Objective  Filed Vitals:   05/10/14 1741 05/10/14 2212 05/11/14 0234 05/11/14 0553  BP: 108/47 127/71 127/53  132/44  Pulse: 78 67 78 78  Temp: 98.9 F (37.2 C) 98.6 F (37 C) 98.1 F  (36.7 C) 98 F (36.7 C)  TempSrc: Oral Oral Oral Oral  Resp: 20 20 20 20   Height:      Weight:      SpO2: 98% 97% 98% 98%    Intake/Output Summary (Last 24 hours) at 05/11/14 0716 Last data filed at 05/11/14 0500  Gross per 24 hour  Intake  909.5 ml  Output      0 ml  Net  909.5 ml   Filed Weights   05/04/14 2116 05/05/14 0300  Weight: 56.7 kg (125 lb) 55.1 kg (121 lb 7.6 oz)   Exam:  General:  NAD  HEENT: no scleral icterus  Cardiovascular: RRR, 3/6 SEM  Respiratory: CTA biL  Abdomen: soft, non tender  MSK/Extremities: dark right foot, decreased pulses  Skin: no rashes  Neuro: non focal  Data Reviewed: Basic Metabolic Panel:  Recent Labs Lab 05/04/14 2156 05/06/14 0406 05/07/14 0510 05/08/14 0353 05/11/14 0134  NA 142 146* 143 143 141  K 4.6 3.4* 4.0 3.8 4.5  CL 107 113* 110 110 111  CO2 27 24 23 24 26   GLUCOSE 149* 85 127* 122* 150*  BUN 21 19 17 17 19   CREATININE 1.21* 1.05 1.03 1.04 0.92  CALCIUM 8.7 8.5 8.7 8.4 8.1*   Liver Function Tests:  Recent Labs Lab 05/04/14 2156  AST 60*  ALT 23  ALKPHOS 69  BILITOT 1.5*  PROT 6.4  ALBUMIN 2.8*   CBC:  Recent Labs Lab 05/04/14 0800  05/04/14 2156 05/06/14 0500 05/07/14 0510 05/08/14 0353 05/10/14 0535 05/11/14 0134  WBC 8.9  --  8.4 6.3 8.4 9.5 9.5 8.8  NEUTROABS 7.5  --  7.1  --   --   --   --   --   HGB 10.4*  < > 9.5* 8.5* 9.0* 8.8* 8.8* 8.7*  HCT 32.0*  < > 28.8* 26.3* 27.6* 26.8* 27.1* 27.4*  MCV 92.8  --  93.2 93.3 93.6 93.1 94.8 97.9  PLT 188  --  159 152 161 169 177 165  < > = values in this interval not displayed.  BNP (last 3 results)  Recent Labs  12/15/13 1403  PROBNP 429.0*   CBG:  Recent Labs Lab 05/10/14 0643 05/10/14 1140 05/10/14 1701 05/10/14 2158 05/11/14 0621  GLUCAP 118* 129* 220* 175* 125*    Recent Results (from the past 240 hour(s))  MRSA PCR Screening     Status: None   Collection Time: 05/10/14 11:16 PM  Result Value Ref Range Status     MRSA by PCR NEGATIVE NEGATIVE Final    Comment:        The GeneXpert MRSA Assay (FDA approved for NASAL specimens only), is one component of a comprehensive MRSA colonization surveillance program. It is not intended to diagnose MRSA infection nor to guide or monitor treatment for MRSA infections.      Scheduled Meds: . antiseptic oral rinse  7 mL Mouth Rinse q12n4p  . aspirin EC  81 mg Oral Daily  . atorvastatin  40 mg Oral Daily  . bisoprolol  10 mg Oral Daily  . chlorhexidine  15 mL Mouth Rinse BID  . dextrose  25 mL Intravenous Once  . feeding supplement (ENSURE COMPLETE)  237 mL Oral TID WC  . feeding supplement (PRO-STAT SUGAR FREE 64)  30 mL Oral TID WC  . insulin aspart  0-5 Units  Subcutaneous QHS  . insulin aspart  0-9 Units Subcutaneous TID WC  . losartan  50 mg Oral Daily  . memantine  28 mg Oral Daily  . potassium chloride  10 mEq Oral Daily   Continuous Infusions: . sodium chloride 75 mL/hr at 05/11/14 0500  . heparin Stopped (05/10/14 1949)    Judith Board, MD [facility] Pager [phone]. If 7 PM - 7 AM, please contact night-coverage at [email or web address], password [facility] 05/11/2014, 7:16 AM  LOS: 7 days

## 2014-05-11 NOTE — Transfer of Care (Signed)
Immediate Anesthesia Transfer of Care Note  Patient: Judith Zuniga  Procedure(s) Performed: Procedure(s): AMPUTATION BELOW KNEE (Right)  Patient Location: PACU  Anesthesia Type:General  Level of Consciousness: awake, alert  and sedated  Airway & Oxygen Therapy: Patient Spontanous Breathing and Patient connected to nasal cannula oxygen  Post-op Assessment: Report given to PACU RN, Post -op Vital signs reviewed and stable and Patient moving all extremities  Post vital signs: Reviewed and stable  Complications: No apparent anesthesia complications

## 2014-05-11 NOTE — Progress Notes (Signed)
ANTICOAGULATION CONSULT NOTE   Pharmacy Consult for Heparin Indication: atrial fibrillation  No Known Allergies  Patient Measurements: Height: 5\' 2"  (157.5 cm) Weight: 121 lb 7.6 oz (55.1 kg) IBW/kg (Calculated) : 50.1 Heparin Dosing Weight: 55 kg  Vital Signs: Temp: 97.9 F (36.6 C) (01/27 1227) Temp Source: Oral (01/27 0553) BP: 135/49 mmHg (01/27 1227) Pulse Rate: 81 (01/27 1227)  Labs:  Recent Labs  05/09/14 0453 05/09/14 1615 05/10/14 0535 05/10/14 1535 05/11/14 0134  HGB  --   --  8.8*  --  8.7*  HCT  --   --  27.1*  --  27.4*  PLT  --   --  177  --  165  LABPROT 20.6*  --  18.3*  --  16.5*  INR 1.75*  --  1.50*  --  1.32  HEPARINUNFRC  --  <0.10*  --  0.22* <0.10*  CREATININE  --   --   --   --  0.92    Estimated Creatinine Clearance: 31.5 mL/min (by C-G formula based on Cr of 0.92).  Assessment: 81 YO Female with h/o AFib. Her Coumadin was on hold while she was on heparin for surgery. S/p transtibial amputation today. All anticoagulation on hold. Per Dr. Sharol Given, may resume heparin infusion tomorrow morning.   Goal of Therapy:  Heparin level 0.3-0.5 units/ml Monitor platelets by anticoagulation protocol: Yes   Plan:  -Resume heparin infusion at 950 units/hr at 0700 on 1/28  -F/u 8 hr level tomorrow  -Monitor daily HL, CBC and s/s of bleeding -F/u about resuming Coumadin    Albertina Parr, PharmD., BCPS Clinical Pharmacist Pager [phone]

## 2014-05-11 NOTE — Anesthesia Postprocedure Evaluation (Signed)
  Anesthesia Post-op Note  Patient: Judith Zuniga  Procedure(s) Performed: Procedure(s): AMPUTATION BELOW KNEE (Right)  Patient Location: PACU  Anesthesia Type:General and GA combined with regional for post-op pain  Level of Consciousness: awake, oriented, sedated and patient cooperative  Airway and Oxygen Therapy: Patient Spontanous Breathing  Post-op Pain: mild  Post-op Assessment: Post-op Vital signs reviewed, Patient's Cardiovascular Status Stable, Respiratory Function Stable, Patent Airway, No signs of Nausea or vomiting and Pain level controlled  Post-op Vital Signs: stable  Last Vitals:  Filed Vitals:   05/11/14 1227  BP: 135/49  Pulse: 81  Temp: 36.6 C  Resp: 18    Complications: No apparent anesthesia complications

## 2014-05-11 NOTE — Op Note (Signed)
   Date of Surgery: 05/11/2014  INDICATIONS: Judith Zuniga is a 81 y.o.-year-old female who had ischemic changes to her right foot. She has severe peripheral vascular disease. She underwent attempted revascularization with radiology and had a progressive ischemic gangrenous changes to the forefoot. Patient presents at this time for transtibial amputation.Marland Kitchen  PREOPERATIVE DIAGNOSIS: Peripheral vascular disease gangrene right foot  POSTOPERATIVE DIAGNOSIS: Same.  PROCEDURE: Transtibial amputation  SURGEON: Sharol Given, M.D.  ANESTHESIA:  general  IV FLUIDS AND URINE: See anesthesia.  ESTIMATED BLOOD LOSS: Minimal mL.  COMPLICATIONS: None.  DESCRIPTION OF PROCEDURE: The patient was brought to the operating room and underwent a general anesthetic. After adequate levels of anesthesia were obtained patient's lower extremity was prepped using DuraPrep draped into a sterile field. A timeout was called.  A transverse incision was made 11 cm distal to the tibial tubercle. This curved proximally and a large posterior flap was created. The tibia was transected 1 cm proximal to the skin incision. The fibula was transected just proximal to the tibial incision. The tibia was beveled anteriorly. A large posterior flap was created. The sciatic nerve was pulled cut and allowed to retract. The vascular bundles were suture ligated with 2-0 silk. The deep and superficial fascial layers were closed using #1 Vicryl. The skin was closed using staples and 2-0 nylon. The wound was covered with Adaptic orthopedic sponges AB dressing Kerlix and Coban. Patient was extubated taken to the PACU in stable condition.  Judith Score, MD [place] 11:45 AM

## 2014-05-12 ENCOUNTER — Encounter (HOSPITAL_COMMUNITY): Payer: Self-pay | Admitting: Orthopedic Surgery

## 2014-05-12 ENCOUNTER — Other Ambulatory Visit: Payer: Medicare Other

## 2014-05-12 LAB — CBC
HCT: 28.5 % — ABNORMAL LOW (ref 36.0–46.0)
Hemoglobin: 8.9 g/dL — ABNORMAL LOW (ref 12.0–15.0)
MCH: 30.7 pg (ref 26.0–34.0)
MCHC: 31.2 g/dL (ref 30.0–36.0)
MCV: 98.3 fL (ref 78.0–100.0)
Platelets: 194 10*3/uL (ref 150–400)
RBC: 2.9 MIL/uL — AB (ref 3.87–5.11)
RDW: 15.5 % (ref 11.5–15.5)
WBC: 9.3 10*3/uL (ref 4.0–10.5)

## 2014-05-12 LAB — BASIC METABOLIC PANEL
Anion gap: 7 (ref 5–15)
BUN: 14 mg/dL (ref 6–23)
CALCIUM: 8.3 mg/dL — AB (ref 8.4–10.5)
CO2: 26 mmol/L (ref 19–32)
Chloride: 108 mmol/L (ref 96–112)
Creatinine, Ser: 0.91 mg/dL (ref 0.50–1.10)
GFR calc Af Amer: 62 mL/min — ABNORMAL LOW (ref >90)
GFR calc non Af Amer: 54 mL/min — ABNORMAL LOW (ref >90)
Glucose, Bld: 160 mg/dL — ABNORMAL HIGH (ref 70–99)
Potassium: 4.7 mmol/L (ref 3.5–5.1)
Sodium: 141 mmol/L (ref 135–145)

## 2014-05-12 LAB — GLUCOSE, CAPILLARY: GLUCOSE-CAPILLARY: 110 mg/dL — AB (ref 70–99)

## 2014-05-12 LAB — PROTIME-INR
INR: 1.34 (ref 0.00–1.49)
Prothrombin Time: 16.7 seconds — ABNORMAL HIGH (ref 11.6–15.2)

## 2014-05-12 LAB — HEPARIN LEVEL (UNFRACTIONATED): Heparin Unfractionated: 0.26 IU/mL — ABNORMAL LOW (ref 0.30–0.70)

## 2014-05-12 MED ORDER — OXYCODONE-ACETAMINOPHEN 5-325 MG PO TABS
1.0000 | ORAL_TABLET | Freq: Four times a day (QID) | ORAL | Status: DC | PRN
  Administered 2014-05-12 – 2014-05-13 (×4): 1 via ORAL
  Filled 2014-05-12 (×4): qty 1

## 2014-05-12 NOTE — Progress Notes (Signed)
Occupational Therapy Treatment Patient Details Name: Judith Zuniga MRN: [id number] DOB: [birth year] Today's Date: 05/12/2014    History of present illness Patient is a 81 y/o female who is s/p angiography and revascularization of her RLE on 05/04/14. During the procedure the pt experienced agitation and confusion immediately following administration of the initial dose of conscious sedation and the procedure was completed with some difficulty. She did return to the ER later that evening / early the next morning with worsening aphasia, right facial droop and slurring. Ct head- (-). PMH of dementia, on chronic coumadin (this was held for five days recently for procedure on 05/04/2014--recent INR on 1/20 1.54), pacemker, CAD, DM. s/p R BKA 1/27.   OT comments  Pt now with new R BKA.  Up in chair needing assist for eating as her appetite is poor and she requires assist to increase intake.  Performed grooming in sitting and perform sit to partial stand x 1 from chair.  Goals updated to reflect change in status.  SNF continues to be the optimal discharge plan.  Follow Up Recommendations  SNF;Supervision/Assistance - 24 hour    Equipment Recommendations       Recommendations for Other Services      Precautions / Restrictions Precautions Precautions: Fall Precaution Comments: s/p R BKA Restrictions Weight Bearing Restrictions: No       Mobility Bed Mobility     General bed mobility comments: Pt up in chair.  Transfers Overall transfer level: Needs assistance Equipment used: None  Sit to Stand: Mod assist        General transfer comment: stood from chair to get gown from under x 1 with mod assist, knee blocked    Balance Overall balance assessment: Needs assistance Sitting-balance support: Feet supported;Bilateral upper extremity supported Sitting balance-Leahy Scale: Fair     Standing balance support: During functional activity Standing balance-Leahy Scale: Zero                      ADL Overall ADL's : Needs assistance/impaired Eating/Feeding: Moderate assistance;Sitting Eating/Feeding Details (indicate cue type and reason): pt with poor appetite, physically able to self feed, but does not eat enough unless fed Grooming: Oral care;Set up;Sitting;Wash/dry hands;Wash/dry face Grooming Details (indicate cue type and reason): Pt able to sequence toothbrushing         Upper Body Dressing : Minimal assistance;Sitting (changed gown after spillage from eating)                     General ADL Comments: Pt with difficulty choosing whether to return to bed or remain up in chair.      Vision                     Perception     Praxis      Cognition   Behavior During Therapy: WFL for tasks assessed/performed Overall Cognitive Status: History of cognitive impairments - at baseline                       Extremity/Trunk Assessment               Exercises   Shoulder Instructions       General Comments      Pertinent Vitals/ Pain       Pain Assessment: Faces Faces Pain Scale: Hurts little more Pain Location: R LE Pain Descriptors / Indicators: Grimacing;Operative site guarding Pain Intervention(s): Repositioned;Limited activity within patient's  tolerance;Monitored during session  Home Living                                          Prior Functioning/Environment              Frequency Min 2X/week     Progress Toward Goals  OT Goals(current goals can now be found in the care plan section)  Progress towards OT goals: Goals drowngraded-see care plan  Acute Rehab OT Goals OT Goal Formulation: With patient/family Time For Goal Achievement: 05/26/14 Potential to Achieve Goals: Fair ADL Goals Pt Will Perform Upper Body Bathing: with min guard assist;sitting Pt Will Transfer to Toilet: with min assist;stand pivot transfer;bedside commode Additional ADL Goal #1: Pt will perform supine to  sit with min guard assist in preparation for ADL at EOB. Additional ADL Goal #2: Pt will demonstrate ability to perform wide range reaching in sitting with min guard assist.  Plan Discharge plan remains appropriate    Co-evaluation                 End of Session     Activity Tolerance Patient tolerated treatment well   Patient Left in chair;with call bell/phone within reach;with family/visitor present;with nursing/sitter in room   Nurse Communication Mobility status (instructed RN to transfer pt toward L side)        Time: 2248-2500 OT Time Calculation (min): 27 min  Charges: OT General Charges $OT Visit: 1 Procedure OT Treatments $Self Care/Home Management : 23-37 mins  Malka So 05/12/2014, 1:20 PM

## 2014-05-12 NOTE — Progress Notes (Signed)
PROGRESS NOTE  Judith Zuniga RSW:[id number] DOB: [birth year] DOA: 05/04/2014 PCP: Jani Gravel, MD  HPI: Judith Zuniga is a 81 y.o. female with a history of CAD/Ischemic Cardiomyopathy, Complete AV Block S/P St Jude Biventricular Pacemaker, Chronic Atrial Fibrillation on Coumadin Rx, Systolic CHF, DM2, HTN, PAD, and Dementia who was brought to the ED via EMS due to symptoms of Increased Confusion, Slurring of her speech and a right facial droop noticed by her daughter at 3 pm during the day following her having an Angiogram and angioplasty of the Rt Lower Extremity by Interventional Radiology in short stay for an ischemic right foot. Patient was evaluated by orthopedic, secondary to worsening great toe necrosis, the plan is for transtibial amputation giving her progressive gangrene.  Subjective / 24 H Interval events No complaints this morning, no chest pain, SOB, no abdominal pain, nausea or vomiting  Assessment/Plan: Active Problems:   Hyperlipidemia   Benign hypertensive heart disease with heart failure   CAD, ARTERY BYPASS GRAFT   PACEMAKER, CRT-St. 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 Laurence Ferrari 1/20 , status post right lower extremity arteriogram and revascularization (PTA) and atherectomy of PT and atherectomy, and unsuccessful recanalization of AT. - Orthopedic consult appreciated, patient noted to have worsening of her ischemic changes, now s/p transtibial  Amputation 1/27 - Does not appear to be infected, so no indication for antibiotics. - doing well post op, heparin resumed this morning, Hb stable, no apparent bleeding  Acute encephalopathy, facial droop, slurred speech - Unclear if this is related to acute CVA versus TIA or not, given unable to do MRI secondary to pacemaker, but this is most likely related to acute encephalopathy from medication and procedure, patient has no residual neurologic deficits. - 2-D echo EF of 55%, multiple valvular disease.,  - carotid Dopplers,40-59% right internal carotid artery stenosis by peak systolic velocity, 09-38% by ICA/CCA ratio, and 1-82% by end diastolic velocities. The left internal carotid artery exhbits 40-59% stenosis by peak systolic velocity, and 9-93% stenosis by end diastolic velocity and ICA/CCA ratio. Vertebral arteries are patent with antegrade flow. - hemoglobin A1c is 6.3 - Continue with aspirin and statin -LDL 63  Ischemic cardiomyopathy/CAD, ARTERY BYPASS GRAFT/Systolic CHF - On Bisoprolol, Losartan, and Atrovastatin  Chronic atrial fibrillation - Currently holding warfarin for surgery, restart per ortho  Diastolic CHF, chronic- On Bisoprolol, and Losartan  Hyperlipidemia -On Atorvastatin Rx  Benign hypertensive heart disease with heart failure - On Bisoprolol, Losartan, Rx  PACEMAKER, CRT-St. Jude- due to Complete Heart Block  Hypokalemia -Repleted, continue to monitor  DM2 - Hold Amaryl - SSI coverage PRN - Hemoglobin A1c 6.3   Diet: Diet Carb Modified Fluids: NS DVT Prophylaxis: Heparin  Code Status: Full Code Family Communication: d/w daughter bedside  Disposition Plan: remain intpatient  Consultants:  Orthopedic surgery   Neurology   Procedures:  Transtibial amputation 1/27   Antibiotics  Anti-infectives    None      Studies  No results found.  Objective  Filed Vitals:   05/12/14 0200 05/12/14 0555 05/12/14  0909 05/12/14 1358  BP: 142/57 129/55 127/44 123/40  Pulse: 78 69 77 78  Temp: 98.6 F (37 C) 97.8 F (36.6 C) 98.4 F (36.9 C) 98.7 F (37.1 C)  TempSrc: Oral Oral Oral Oral  Resp: 16 16 16 16   Height:      Weight:      SpO2: 94% 96% 98% 95%    Intake/Output Summary (Last 24 hours) at 05/12/14 1418 Last data filed at 05/11/14 2320  Gross per 24 hour  Intake      0 ml  Output    250 ml  Net   -250 ml   Filed Weights   05/04/14 2116 05/05/14 0300  Weight: 56.7 kg (125 lb) 55.1 kg (121 lb 7.6 oz)   Exam:  General:  NAD  HEENT: no scleral icterus  Cardiovascular: RRR, 3/6 SEM  Respiratory: CTA biL  Abdomen: soft, non tender  MSK/Extremities: dark right foot, decreased pulses  Skin: no rashes  Neuro: non focal  Data Reviewed: Basic Metabolic Panel:  Recent Labs Lab 05/06/14 0406 05/07/14 0510 05/08/14 0353 05/11/14 0134 05/12/14 0845  NA 146* 143 143 141 141  K 3.4* 4.0 3.8 4.5 4.7  CL 113* 110 110 111 108  CO2 24 23 24 26 26   GLUCOSE 85 127* 122* 150* 160*  BUN 19 17 17 19 14   CREATININE 1.05 1.03 1.04 0.92 0.91  CALCIUM 8.5 8.7 8.4 8.1* 8.3*   Liver Function Tests: No results for input(s): AST, ALT, ALKPHOS, BILITOT, PROT, ALBUMIN in the last 168 hours. CBC:  Recent Labs Lab 05/07/14 0510 05/08/14 0353 05/10/14 0535 05/11/14 0134 05/12/14 0845  WBC 8.4 9.5 9.5 8.8 9.3  HGB 9.0* 8.8* 8.8* 8.7* 8.9*  HCT 27.6* 26.8* 27.1* 27.4* 28.5*  MCV 93.6 93.1 94.8 97.9 98.3  PLT 161 169 177 165 194    BNP (last 3 results)  Recent Labs  12/15/13 1403  PROBNP 429.0*   CBG:  Recent Labs Lab 05/11/14 0845 05/11/14 1159 05/11/14 1710 05/11/14 2315 05/12/14 0655  GLUCAP 106* 100* 160* 113* 110*    Recent Results (from the past 240 hour(s))  MRSA PCR Screening     Status: None   Collection Time: 05/10/14 11:16 PM  Result Value Ref Range Status   MRSA by PCR NEGATIVE NEGATIVE Final    Comment:        The GeneXpert MRSA Assay  (FDA approved for NASAL specimens only), is one component of a comprehensive MRSA colonization surveillance program. It is not intended to diagnose MRSA infection nor to guide or monitor treatment for MRSA infections.      Scheduled Meds: . antiseptic oral rinse  7 mL Mouth Rinse q12n4p  . aspirin EC  81 mg Oral Daily  . atorvastatin  40 mg Oral Daily  . bisoprolol  10 mg Oral Daily  . chlorhexidine  15 mL Mouth Rinse BID  . dextrose  25 mL Intravenous Once  . feeding supplement (ENSURE COMPLETE)  237 mL Oral TID WC  . feeding supplement (PRO-STAT SUGAR FREE 64)  30 mL Oral TID WC  . insulin aspart  0-5 Units Subcutaneous QHS  . insulin aspart  0-9 Units Subcutaneous TID WC  . losartan  50 mg Oral Daily  . memantine  28 mg Oral Daily  . potassium chloride  10 mEq Oral Daily   Continuous Infusions: . sodium chloride 75 mL/hr at 05/12/14 1236  .  heparin 950 Units/hr (05/12/14 0755)  . lactated ringers      Marzetta Board, MD [facility] Pager [phone]. If 7 PM - 7 AM, please contact night-coverage at [email or web address], password [facility] 05/12/2014, 2:18 PM  LOS: 8 days

## 2014-05-12 NOTE — Progress Notes (Addendum)
Physical Therapy Treatment Patient Details Name: Judith Zuniga MRN: [id number] DOB: February 15, 1921 Today's Date: 05/12/2014    History of Present Illness Patient is a 81 y/o female who is s/p angiography and revascularization of her RLE on 05/04/14. During the procedure the pt experienced agitation and confusion immediately following administration of the initial dose of conscious sedation and the procedure was completed with some difficulty. She did return to the ER later that evening / early the next morning with worsening aphasia, right facial droop and slurring. Ct head- (-). PMH of dementia, on chronic coumadin (this was held for five days recently for procedure on 05/04/2014--recent INR on 1/20 1.54), pacemker, CAD, DM. s/p R BKA 1/27.    PT Comments    Patient now s/p R BKA. Pt with generalized weakness noted throughout making bed mobility and transfers difficulty. Required Max A for SPT to chair today with partial knee buckling LLE. Pt with baseline cognitive deficits and difficulty comprehending there ex. Goals updated s/p surgery. Pt would benefit from skilled PT and ST SNF to improve transfers, balance and wheelchair training so pt can maximize independence and ease burden of care prior to return home.   Follow Up Recommendations  SNF;Supervision/Assistance - 24 hour     Equipment Recommendations  None recommended by PT    Recommendations for Other Services OT consult     Precautions / Restrictions Precautions Precautions: Fall Precaution Comments: s/p R BKA Restrictions Weight Bearing Restrictions: No    Mobility  Bed Mobility Overal bed mobility: Needs Assistance Bed Mobility: Rolling;Sidelying to Sit Rolling: Min assist Sidelying to sit: Mod assist;HOB elevated       General bed mobility comments: Mod A to bring BLEs to EOB, and elevate trunk. Manual assist to use rails for support.   Transfers Overall transfer level: Needs assistance Equipment used:  None Transfers: Set designer Transfers;Sit to/from Stand Sit to Stand: Mod assist   Squat pivot transfers: Max assist     General transfer comment: Max A SPT bed to chair with cues for technique. Partial left knee buckling during transfer. Mod A to stand from EOB - not able to stand fully upright due to partial knee buckling.  Ambulation/Gait                 Stairs            Wheelchair Mobility    Modified Rankin (Stroke Patients Only)       Balance Overall balance assessment: Needs assistance Sitting-balance support: Feet supported;Bilateral upper extremity supported Sitting balance-Leahy Scale: Fair     Standing balance support: During functional activity Standing balance-Leahy Scale: Zero                      Cognition Arousal/Alertness: Awake/alert Behavior During Therapy: WFL for tasks assessed/performed Overall Cognitive Status: History of cognitive impairments - at baseline                      Exercises General Exercises - Lower Extremity Quad Sets: Right;5 reps;Seated (Difficulty comprehending how to perform) Hip ABduction/ADduction: Right;5 reps;Supine Straight Leg Raises: Right;5 reps;Supine    General Comments General comments (skin integrity, edema, etc.): Education provided on positioning for right stump and there ex to prevent contractions/improve strengthening.      Pertinent Vitals/Pain Pain Assessment: Faces Faces Pain Scale: Hurts even more Pain Location: R stump Pain Descriptors / Indicators: Grimacing Pain Intervention(s): Monitored during session;Repositioned    Home Living  Prior Function            PT Goals (current goals can now be found in the care plan section) Progress towards PT goals: Goals downgraded-see care plan (s/p R BKA)    Frequency  Min 3X/week    PT Plan Current plan remains appropriate    Co-evaluation             End of Session Equipment  Utilized During Treatment: Gait belt Activity Tolerance: Patient tolerated treatment well Patient left: in chair;with call bell/phone within reach;with chair alarm set;with family/visitor present     Time: 2130-8657 PT Time Calculation (min) (ACUTE ONLY): 20 min  Charges:                       G CodesCandy Sledge A Jun 10, 2014, 11:36 AM  Candy Sledge, PT, DPT [phone]

## 2014-05-12 NOTE — Progress Notes (Signed)
Pt's daughter was concerned that pt had not voided in awhile. RN bladder scanned pt. Bladder scan showed 569 ml of urine. Pt was place on bedpan and voided. RN bladder scanned post void. Scan showed 61 ml. Will continue to monitor.

## 2014-05-12 NOTE — Clinical Social Work Note (Signed)
CSW spoke with Janie at [employer]. Narda Rutherford confirmed the pt can transition once medically stable. CSW will continue to follow and assist with discharge.   Terrell Hills, MSW, [place]

## 2014-05-12 NOTE — Progress Notes (Signed)
UR COMPLETED  

## 2014-05-12 NOTE — Plan of Care (Signed)
Problem: Acute Rehab OT Goals (only OT should resolve) Goal: Pt. Will Perform Grooming BKA Goal: Pt. Will Perform Lower Body Bathing BKA Goal: Pt. Will Perform Lower Body Dressing BKA

## 2014-05-12 NOTE — Progress Notes (Signed)
ANTICOAGULATION CONSULT NOTE - Follow Up Consult  Pharmacy Consult for Heparin Indication: atrial fibrillation  No Known Allergies  Patient Measurements: Height: 5\' 2"  (157.5 cm) Weight: 121 lb 7.6 oz (55.1 kg) IBW/kg (Calculated) : 50.1 Heparin Dosing Weight: 55 kg  Vital Signs: Temp: 98.7 F (37.1 C) (01/28 1358) Temp Source: Oral (01/28 1358) BP: 123/40 mmHg (01/28 1358) Pulse Rate: 78 (01/28 1358)  Labs:  Recent Labs  05/10/14 0535 05/10/14 1535 05/11/14 0134 05/12/14 0845 05/12/14 1545  HGB 8.8*  --  8.7* 8.9*  --   HCT 27.1*  --  27.4* 28.5*  --   PLT 177  --  165 194  --   LABPROT 18.3*  --  16.5* 16.7*  --   INR 1.50*  --  1.32 1.34  --   HEPARINUNFRC  --  0.22* <0.10*  --  0.26*  CREATININE  --   --  0.92 0.91  --     Estimated Creatinine Clearance: 31.8 mL/min (by C-G formula based on Cr of 0.91).  Assessment: Anticoagulation: On Coumadin PTA for Afib. Heparin drip was stopped for transtibial amputation (Ischemic R foot, worsening great toe necrosis, progressive gangrene). S/p surgery. Per Dr. Sharol Given, resumed heparin infusion today 1/28 at ~0800. H/H low but stable, Plt wnl. INR 1.34. Heparin level 0.26 almost back to goal range.  Goal of Therapy:  Heparin level 0.3-0.7 units/ml Monitor platelets by anticoagulation protocol: Yes   Plan:  Increase IV heparin slightly to 1000 units/hr (increased risk of bleedin s/p surgery). Will recheck heparin level in 6 hrs.   Haidan Nhan S. Alford Highland, PharmD, BCPS Clinical Staff Pharmacist Pager [phone]  Eilene Ghazi Stillinger 05/12/2014,4:49 PM

## 2014-05-13 DIAGNOSIS — D649 Anemia, unspecified: Secondary | ICD-10-CM

## 2014-05-13 DIAGNOSIS — I5032 Chronic diastolic (congestive) heart failure: Secondary | ICD-10-CM

## 2014-05-13 LAB — CBC
HCT: 23.8 % — ABNORMAL LOW (ref 36.0–46.0)
Hemoglobin: 7.6 g/dL — ABNORMAL LOW (ref 12.0–15.0)
MCH: 31.1 pg (ref 26.0–34.0)
MCHC: 31.9 g/dL (ref 30.0–36.0)
MCV: 97.5 fL (ref 78.0–100.0)
Platelets: 163 10*3/uL (ref 150–400)
RBC: 2.44 MIL/uL — ABNORMAL LOW (ref 3.87–5.11)
RDW: 15.7 % — ABNORMAL HIGH (ref 11.5–15.5)
WBC: 7.9 10*3/uL (ref 4.0–10.5)

## 2014-05-13 LAB — PROTIME-INR
INR: 1.35 (ref 0.00–1.49)
Prothrombin Time: 16.8 seconds — ABNORMAL HIGH (ref 11.6–15.2)

## 2014-05-13 LAB — PREPARE RBC (CROSSMATCH)

## 2014-05-13 LAB — GLUCOSE, CAPILLARY
GLUCOSE-CAPILLARY: 185 mg/dL — AB (ref 70–99)
Glucose-Capillary: 274 mg/dL — ABNORMAL HIGH (ref 70–99)
Glucose-Capillary: 172 mg/dL — ABNORMAL HIGH (ref 70–99)
Glucose-Capillary: 191 mg/dL — ABNORMAL HIGH (ref 70–99)
Glucose-Capillary: 113 mg/dL — ABNORMAL HIGH (ref 70–99)
Glucose-Capillary: 177 mg/dL — ABNORMAL HIGH (ref 70–99)
Glucose-Capillary: 180 mg/dL — ABNORMAL HIGH (ref 70–99)

## 2014-05-13 LAB — HEPARIN LEVEL (UNFRACTIONATED)
HEPARIN UNFRACTIONATED: 0.54 [IU]/mL (ref 0.30–0.70)
Heparin Unfractionated: 0.36 IU/mL (ref 0.30–0.70)

## 2014-05-13 LAB — ABO/RH: ABO/RH(D): A NEG

## 2014-05-13 MED ORDER — SODIUM CHLORIDE 0.9 % IV SOLN
Freq: Once | INTRAVENOUS | Status: AC
  Administered 2014-05-13: 17:00:00 via INTRAVENOUS

## 2014-05-13 NOTE — Progress Notes (Signed)
PROGRESS NOTE  Judith Zuniga PNT:[id number] DOB: Jun 17, 1920 DOA: 05/04/2014 PCP: Jani Gravel, MD  HPI: Judith Zuniga is a 81 y.o. female with a history of CAD/Ischemic Cardiomyopathy, Complete AV Block S/P St Jude Biventricular Pacemaker, Chronic Atrial Fibrillation on Coumadin Rx, Systolic CHF, DM2, HTN, PAD, and Dementia who was brought to the ED via EMS due to symptoms of Increased Confusion, Slurring of her speech and a right facial droop noticed by her daughter at 3 pm during the day following her having an Angiogram and angioplasty of the Rt Lower Extremity by Interventional Radiology in short stay for an ischemic right foot. Patient was evaluated by orthopedic, secondary to worsening great toe necrosis, the plan is for transtibial amputation giving her progressive gangrene.  Subjective / 24 H Interval events No complaints this morning  Assessment/Plan: Active Problems:   Hyperlipidemia   Benign hypertensive heart disease with heart failure   CAD, ARTERY BYPASS GRAFT   PACEMAKER, CRT-St. 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 Laurence Ferrari 1/20 , status post right lower extremity arteriogram and revascularization (PTA) and atherectomy of PT and atherectomy, and unsuccessful recanalization of AT. - Orthopedic consult appreciated, patient noted to have worsening of her ischemic changes, now s/p transtibial Amputation 1/27 - Does not appear to be infected, so no  indication for antibiotics. - doing well post op, heparin resumed 1/28. Mild Hb drop 1/29, transfuse 1U   ABLA - post op, slight drop in Hb today, no bleeding from surgical site, dressing intact, suspect dilutional/ABLA, transfuse 1U pRBC, maintain heparin gtt and closely monitor. Also discussed with Dr. Sharol Given.  Acute encephalopathy, facial droop, slurred speech - Unclear if this is related to acute CVA versus TIA or not, given unable to do MRI secondary to pacemaker, but this is most likely related to acute encephalopathy from medication and procedure, patient has no residual neurologic deficits. - 2-D echo EF of 55%, multiple valvular disease.,  - carotid Dopplers,40-59% right internal carotid artery stenosis by peak systolic velocity, 08-67% by ICA/CCA ratio, and 6-19% by end diastolic velocities. The left internal carotid artery exhbits 40-59% stenosis by peak systolic velocity, and 5-09% stenosis by end diastolic velocity and ICA/CCA ratio. Vertebral arteries are patent with antegrade flow. - hemoglobin A1c is 6.3 - Continue with aspirin and statin -LDL 63  Ischemic cardiomyopathy/CAD, ARTERY BYPASS GRAFT/Systolic CHF - On Bisoprolol, Losartan, and Atrovastatin  Chronic atrial fibrillation - Currently holding warfarin for surgery - restart warfarin if stable on heparin, will need to do Lovenox bridging per neurology recommendations  Diastolic CHF, chronic- On Bisoprolol, and Losartan  Hyperlipidemia -On Atorvastatin Rx  Benign hypertensive heart disease with heart failure - On Bisoprolol, Losartan, Rx  PACEMAKER, CRT-St. Jude- due to Complete Heart Block  Hypokalemia -Repleted, continue to monitor  DM2 - Hold Amaryl - SSI coverage PRN - Hemoglobin A1c 6.3   Diet: Diet Carb Modified Fluids: NS DVT Prophylaxis: Heparin  Code Status: Full Code Family Communication: d/w daughter bedside  Disposition  Plan: remain  intpatient  Consultants:  Orthopedic surgery   Neurology   Procedures:  Transtibial amputation 1/27   Antibiotics  Anti-infectives    None      Studies  No results found.  Objective  Filed Vitals:   05/13/14 0127 05/13/14 0623 05/13/14 0913 05/13/14 1404  BP: 129/57 124/49 106/40 138/51  Pulse: 78 70 77 77  Temp: 99.5 F (37.5 C) 98.7 F (37.1 C) 99.1 F (37.3 C) 98 F (36.7 C)  TempSrc: Oral Oral Oral Oral  Resp: 16 16 20 20   Height:      Weight:      SpO2: 93% 96% 95% 98%    Intake/Output Summary (Last 24 hours) at 05/13/14 1422 Last data filed at 05/13/14 0914  Gross per 24 hour  Intake    360 ml  Output      0 ml  Net    360 ml   Filed Weights   05/04/14 2116 05/05/14 0300  Weight: 56.7 kg (125 lb) 55.1 kg (121 lb 7.6 oz)   Exam:  General:  NAD  HEENT: no scleral icterus  Cardiovascular: RRR, 3/6 SEM  Respiratory: CTA biL  Abdomen: soft, non tender  MSK/Extremities: dark right foot, decreased pulses  Skin: no rashes  Neuro: non focal  Data Reviewed: Basic Metabolic Panel:  Recent Labs Lab 05/07/14 0510 05/08/14 0353 05/11/14 0134 05/12/14 0845  NA 143 143 141 141  K 4.0 3.8 4.5 4.7  CL 110 110 111 108  CO2 23 24 26 26   GLUCOSE 127* 122* 150* 160*  BUN 17 17 19 14   CREATININE 1.03 1.04 0.92 0.91  CALCIUM 8.7 8.4 8.1* 8.3*   CBC:  Recent Labs Lab 05/08/14 0353 05/10/14 0535 05/11/14 0134 05/12/14 0845 05/13/14 0534  WBC 9.5 9.5 8.8 9.3 7.9  HGB 8.8* 8.8* 8.7* 8.9* 7.6*  HCT 26.8* 27.1* 27.4* 28.5* 23.8*  MCV 93.1 94.8 97.9 98.3 97.5  PLT 169 177 165 194 163    BNP (last 3 results)  Recent Labs  12/15/13 1403  PROBNP 429.0*   CBG:  Recent Labs Lab 05/12/14 1122 05/12/14 1623 05/12/14 2134 05/13/14 0622 05/13/14 1126  GLUCAP 274* 172* 191* 113* 177*    Recent Results (from the past 240 hour(s))  MRSA PCR Screening     Status: None   Collection Time: 05/10/14 11:16 PM  Result Value Ref Range  Status   MRSA by PCR NEGATIVE NEGATIVE Final    Comment:        The GeneXpert MRSA Assay (FDA approved for NASAL specimens only), is one component of a comprehensive MRSA colonization surveillance program. It is not intended to diagnose MRSA infection nor to guide or monitor treatment for MRSA infections.      Scheduled Meds: . sodium chloride   Intravenous Once  . antiseptic oral rinse  7 mL Mouth Rinse q12n4p  . aspirin EC  81 mg Oral Daily  . atorvastatin  40 mg Oral Daily  . bisoprolol  10 mg Oral Daily  . chlorhexidine  15 mL Mouth Rinse BID  . dextrose  25 mL Intravenous Once  . feeding supplement (ENSURE COMPLETE)  237 mL Oral TID WC  . feeding supplement (PRO-STAT SUGAR FREE 64)  30 mL Oral TID WC  . insulin aspart  0-5 Units Subcutaneous QHS  . insulin aspart  0-9 Units Subcutaneous TID WC  . losartan  50 mg Oral Daily  . memantine  28 mg Oral Daily  . potassium  chloride  10 mEq Oral Daily   Continuous Infusions: . sodium chloride 75 mL/hr at 05/13/14 0143  . heparin 1,000 Units/hr (05/13/14 1001)  . lactated ringers      Marzetta Board, MD [facility] Pager [phone]. If 7 PM - 7 AM, please contact night-coverage at [email or web address], password [facility] 05/13/2014, 2:22 PM  LOS: 9 days

## 2014-05-13 NOTE — Progress Notes (Signed)
ANTICOAGULATION CONSULT NOTE - Follow Up Consult  Pharmacy Consult for heparin Indication: atrial fibrillation  No Known Allergies  Patient Measurements: Height: 5\' 2"  (157.5 cm) Weight: 121 lb 7.6 oz (55.1 kg) IBW/kg (Calculated) : 50.1 Heparin Dosing Weight: 55 kg  Vital Signs: Temp: 99.1 F (37.3 C) (01/29 0913) Temp Source: Oral (01/29 0913) BP: 106/40 mmHg (01/29 0913) Pulse Rate: 77 (01/29 0913)  Labs:  Recent Labs  05/11/14 0134 05/12/14 0845 05/12/14 1545 05/12/14 2341 05/13/14 0534 05/13/14 0845  HGB 8.7* 8.9*  --   --  7.6*  --   HCT 27.4* 28.5*  --   --  23.8*  --   PLT 165 194  --   --  163  --   LABPROT 16.5* 16.7*  --   --  16.8*  --   INR 1.32 1.34  --   --  1.35  --   HEPARINUNFRC <0.10*  --  0.26* 0.36  --  0.54  CREATININE 0.92 0.91  --   --   --   --     Estimated Creatinine Clearance: 31.8 mL/min (by C-G formula based on Cr of 0.91).   Medication: . sodium chloride 75 mL/hr at 05/13/14 0143  . heparin 1,000 Units/hr (05/13/14 1001)  . lactated ringers      Assessment: 81 y/o female on warfarin PTA for Afib which was held for surgery. She is s/p transtibial amputation on 1/27. She continues on IV heparin while warfarin is on hold. Heparin level is 0.54 on 1000 units/hr. No overt bleeding noted, Hb trended down to 7.6 from 8.9, platelets are normal.  Goal of Therapy:  Heparin level 0.3-0.7 units/ml Monitor platelets by anticoagulation protocol: Yes   Plan:  - Continue heparin drip at 1000 units/hr - Daily heparin level and CBC - Monitor for s/sx of bleeding - F/U transition to Lovenox and warfarin  [facility], [place].D., BCPS Clinical Pharmacist Pager: [phone] 05/13/2014 10:20 AM

## 2014-05-13 NOTE — Progress Notes (Signed)
ANTICOAGULATION CONSULT NOTE - Follow Up Consult  Pharmacy Consult for heparin Indication: atrial fibrillation   Labs:  Recent Labs  05/10/14 0535  05/11/14 0134 05/12/14 0845 05/12/14 1545 05/12/14 2341  HGB 8.8*  --  8.7* 8.9*  --   --   HCT 27.1*  --  27.4* 28.5*  --   --   PLT 177  --  165 194  --   --   LABPROT 18.3*  --  16.5* 16.7*  --   --   INR 1.50*  --  1.32 1.34  --   --   HEPARINUNFRC  --   < > <0.10*  --  0.26* 0.36  CREATININE  --   --  0.92 0.91  --   --   < > = values in this interval not displayed.   Assessment/Plan:  81yo female therapeutic on heparin after rate increase. Will continue gtt at current rate and confirm stable with am labs.   Wynona Neat, PharmD, BCPS  05/13/2014,12:25 AM

## 2014-05-14 LAB — GLUCOSE, CAPILLARY
Glucose-Capillary: 123 mg/dL — ABNORMAL HIGH (ref 70–99)
Glucose-Capillary: 129 mg/dL — ABNORMAL HIGH (ref 70–99)
Glucose-Capillary: 134 mg/dL — ABNORMAL HIGH (ref 70–99)
Glucose-Capillary: 118 mg/dL — ABNORMAL HIGH (ref 70–99)

## 2014-05-14 LAB — TYPE AND SCREEN
ABO/RH(D): A NEG
Antibody Screen: NEGATIVE
Unit division: 0

## 2014-05-14 LAB — BASIC METABOLIC PANEL
Anion gap: 2 — ABNORMAL LOW (ref 5–15)
BUN: 19 mg/dL (ref 6–23)
CALCIUM: 8 mg/dL — AB (ref 8.4–10.5)
CHLORIDE: 109 mmol/L (ref 96–112)
CO2: 25 mmol/L (ref 19–32)
Creatinine, Ser: 0.79 mg/dL (ref 0.50–1.10)
GFR calc Af Amer: 82 mL/min — ABNORMAL LOW (ref >90)
GFR calc non Af Amer: 71 mL/min — ABNORMAL LOW (ref >90)
Glucose, Bld: 125 mg/dL — ABNORMAL HIGH (ref 70–99)
Potassium: 4.3 mmol/L (ref 3.5–5.1)
Sodium: 136 mmol/L (ref 135–145)

## 2014-05-14 LAB — CBC
HCT: 26.6 % — ABNORMAL LOW (ref 36.0–46.0)
Hemoglobin: 8.7 g/dL — ABNORMAL LOW (ref 12.0–15.0)
MCH: 31 pg (ref 26.0–34.0)
MCHC: 32.7 g/dL (ref 30.0–36.0)
MCV: 94.7 fL (ref 78.0–100.0)
PLATELETS: 164 10*3/uL (ref 150–400)
RBC: 2.81 MIL/uL — ABNORMAL LOW (ref 3.87–5.11)
RDW: 17.1 % — ABNORMAL HIGH (ref 11.5–15.5)
WBC: 8.7 10*3/uL (ref 4.0–10.5)

## 2014-05-14 LAB — PROTIME-INR
INR: 1.29 (ref 0.00–1.49)
PROTHROMBIN TIME: 16.2 s — AB (ref 11.6–15.2)

## 2014-05-14 LAB — HEPARIN LEVEL (UNFRACTIONATED): HEPARIN UNFRACTIONATED: 0.43 [IU]/mL (ref 0.30–0.70)

## 2014-05-14 MED ORDER — WARFARIN SODIUM 7.5 MG PO TABS
7.5000 mg | ORAL_TABLET | Freq: Once | ORAL | Status: AC
  Administered 2014-05-14: 7.5 mg via ORAL
  Filled 2014-05-14: qty 1

## 2014-05-14 MED ORDER — DOCUSATE SODIUM 100 MG PO CAPS
100.0000 mg | ORAL_CAPSULE | Freq: Two times a day (BID) | ORAL | Status: DC
  Administered 2014-05-14 – 2014-05-15 (×3): 100 mg via ORAL
  Filled 2014-05-14 (×3): qty 1

## 2014-05-14 MED ORDER — HYDROMORPHONE HCL 1 MG/ML IJ SOLN: 0.5000 mg | INTRAMUSCULAR | Status: DC | PRN

## 2014-05-14 MED ORDER — SODIUM CHLORIDE 0.9 % IV SOLN: INTRAVENOUS | Status: DC

## 2014-05-14 MED ORDER — CEFAZOLIN SODIUM 1-5 GM-% IV SOLN
1.0000 g | Freq: Four times a day (QID) | INTRAVENOUS | Status: DC
  Filled 2014-05-14 (×3): qty 50

## 2014-05-14 MED ORDER — ONDANSETRON HCL 4 MG PO TABS: 4.0000 mg | ORAL_TABLET | Freq: Four times a day (QID) | ORAL | Status: DC | PRN

## 2014-05-14 MED ORDER — WARFARIN - PHARMACIST DOSING INPATIENT
Freq: Every day | Status: DC
Start: 2014-05-14 — End: 2014-05-15

## 2014-05-14 MED ORDER — ONDANSETRON HCL 4 MG/2ML IJ SOLN: 4.0000 mg | Freq: Four times a day (QID) | INTRAMUSCULAR | Status: DC | PRN

## 2014-05-14 MED ORDER — METHOCARBAMOL 500 MG PO TABS: 500.0000 mg | ORAL_TABLET | Freq: Four times a day (QID) | ORAL | Status: DC | PRN

## 2014-05-14 MED ORDER — DEXTROSE 5 % IV SOLN: 500.0000 mg | Freq: Four times a day (QID) | INTRAVENOUS | Status: DC | PRN

## 2014-05-14 MED ORDER — METOCLOPRAMIDE HCL 5 MG PO TABS: 5.0000 mg | ORAL_TABLET | Freq: Three times a day (TID) | ORAL | Status: DC | PRN

## 2014-05-14 MED ORDER — ENOXAPARIN SODIUM 60 MG/0.6ML ~~LOC~~ SOLN
55.0000 mg | Freq: Two times a day (BID) | SUBCUTANEOUS | Status: DC
  Administered 2014-05-14 – 2014-05-15 (×2): 55 mg via SUBCUTANEOUS
  Filled 2014-05-14 (×5): qty 0.6

## 2014-05-14 MED ORDER — METOCLOPRAMIDE HCL 5 MG/ML IJ SOLN: 5.0000 mg | Freq: Three times a day (TID) | INTRAMUSCULAR | Status: DC | PRN

## 2014-05-14 MED ORDER — OXYCODONE-ACETAMINOPHEN 5-325 MG PO TABS: 1.0000 | ORAL_TABLET | ORAL | Status: DC | PRN

## 2014-05-14 NOTE — Progress Notes (Addendum)
ANTICOAGULATION CONSULT NOTE - Follow Up Consult  Pharmacy Consult for heparin Indication: atrial fibrillation  No Known Allergies  Patient Measurements: Height: 5\' 2"  (157.5 cm) Weight: 121 lb 7.6 oz (55.1 kg) IBW/kg (Calculated) : 50.1 Heparin Dosing Weight: 55 kg  Vital Signs: Temp: 98.7 F (37.1 C) (01/30 1023) Temp Source: Oral (01/30 1023) BP: 134/55 mmHg (01/30 1023) Pulse Rate: 77 (01/30 1023)  Labs:  Recent Labs  05/12/14 0845  05/12/14 2341 05/13/14 0534 05/13/14 0845 05/14/14 0700 05/14/14 0936  HGB 8.9*  --   --  7.6*  --  8.7*  --   HCT 28.5*  --   --  23.8*  --  26.6*  --   PLT 194  --   --  163  --  164  --   LABPROT 16.7*  --   --  16.8*  --  16.2*  --   INR 1.34  --   --  1.35  --  1.29  --   HEPARINUNFRC  --   < > 0.36  --  0.54  --  0.43  CREATININE 0.91  --   --   --   --  0.79  --   < > = values in this interval not displayed.  Estimated Creatinine Clearance: 36.2 mL/min (by C-G formula based on Cr of 0.79).   Medication: . sodium chloride    . heparin 1,000 Units/hr (05/13/14 1001)  . lactated ringers      Assessment: 81 y/o female on warfarin PTA for Afib which was held for surgery. She is s/p transtibial amputation on 1/27. She continues on IV heparin while warfarin is on hold. Heparin level is 0.43 on 1000 units/hr. No overt bleeding noted per RN, Hb back up to 8.7 s/p transfusion, platelets are normal.  Goal of Therapy:  Heparin level 0.3-0.7 units/ml Monitor platelets by anticoagulation protocol: Yes   Plan:  - Continue heparin drip at 1000 units/hr - Daily heparin level and CBC - Monitor for s/sx of bleeding - F/U transition to Lovenox and warfarin  Albertina Parr, PharmD., BCPS Clinical Pharmacist Pager [phone]    Addendum: Now switching from heparin to warfarin + Lovenox bridge. INR today is down to 1.29. Home Coumadin dose was 5 mg daily except 7.5 mg on Wednesday. CrCl ~ 36 mL/min   Plan: -Stop heparin infusion  at 1900 and start Lovenox 55 mg SQ twice daily at 2000. Please make sure that first dose of Lovenox is given 1 hour after heparin is stopped.  -Give Coumadin 7.5 mg x 1 dose tonight -Monitor daily PT/INR, Q 72 hr CBC and s/s of bleeding  Albertina Parr, PharmD., BCPS Clinical Pharmacist Pager [phone]

## 2014-05-14 NOTE — Progress Notes (Signed)
PROGRESS NOTE  Judith Zuniga NAT:[id number] DOB: May 29, 1920 DOA: 05/04/2014 PCP: Jani Gravel, MD  HPI: Judith Zuniga is a 81 y.o. female with a history of CAD/Ischemic Cardiomyopathy, Complete AV Block S/P St Jude Biventricular Pacemaker, Chronic Atrial Fibrillation on Coumadin Rx, Systolic CHF, DM2, HTN, PAD, and Dementia who was brought to the ED via EMS due to symptoms of Increased Confusion, Slurring of her speech and a right facial droop noticed by her daughter at 3 pm during the day following her having an Angiogram and angioplasty of the Rt Lower Extremity by Interventional Radiology in short stay for an ischemic right foot. Patient was evaluated by orthopedic, secondary to worsening great toe necrosis, the plan is for transtibial amputation giving her progressive gangrene.  Subjective / 24 H Interval events No complaints this morning  Assessment/Plan: Active Problems:   Hyperlipidemia   Benign hypertensive heart disease with heart failure   CAD, ARTERY BYPASS GRAFT   PACEMAKER, CRT-St. 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 Laurence Ferrari 1/20 , status post right lower extremity arteriogram and revascularization (PTA) and atherectomy of PT and atherectomy, and unsuccessful recanalization of AT. - Orthopedic consult appreciated, patient noted to have worsening of her ischemic changes, now s/p transtibial Amputation 1/27 - Does not appear to be infected, so no  indication for antibiotics.  ABLA - post op, slight drop in Hb 1/29, s/p 1U pRBC with appropriate increase, no bleeding from surgical site, dressing intact, suspect dilutional/ABLA,  - dressing intact this morning, no evidence of bleeding  Acute encephalopathy, facial droop, slurred speech - Unclear if this is related to acute CVA versus TIA or not, given unable to do MRI secondary to pacemaker, but this is most likely related to acute encephalopathy from medication and procedure, patient has no residual neurologic deficits. - 2-D echo EF of 55%, multiple valvular disease.,  - carotid Dopplers,40-59% right internal carotid artery stenosis by peak systolic velocity, 42-70% by ICA/CCA ratio, and 6-23% by end diastolic velocities. The left internal carotid artery exhbits 40-59% stenosis by peak systolic velocity, and 7-62% stenosis by end diastolic velocity and ICA/CCA ratio. Vertebral arteries are patent with antegrade flow. - hemoglobin A1c is 6.3 - Continue with aspirin and statin -LDL 63  Ischemic cardiomyopathy/CAD, ARTERY BYPASS GRAFT/Systolic CHF - On Bisoprolol, Losartan, and Atrovastatin  Chronic atrial fibrillation - coumadin on hold for surgery, restarted IV heparin at 24 hours after surgery, patient has been stable since, she did require 1 unit of packed red blood cells however I feel like her slight deep in her counts were in the postoperative setting as well as mild delusional component given relative deep in her white count as well as her platelets. We'll discontinue IV fluids, discontinue IV heparin, started Lovenox and Coumadin per pharmacy 1/30, will need bridging as per neurology recommendations.  Diastolic CHF, chronic- On Bisoprolol, and Losartan  Hyperlipidemia -On Atorvastatin Rx  Benign hypertensive heart disease with heart failure - On Bisoprolol, Losartan, Rx  PACEMAKER, CRT-St.  Jude- due to Complete Heart Block  Hypokalemia -Repleted,  continue to monitor  DM2 - Hold Amaryl - SSI coverage PRN - Hemoglobin A1c 6.3   Diet: Diet Carb Modified Fluids: NS DVT Prophylaxis: Heparin  Code Status: Full Code Family Communication: d/w daughter bedside  Disposition Plan: remain inpatient, SNF 1 day of CBC stable  Consultants:  Orthopedic surgery   Neurology   Procedures:  Transtibial amputation 1/27   Antibiotics  Anti-infectives    Start     Dose/Rate Route Frequency Ordered Stop   05/14/14 0800  ceFAZolin (ANCEF) IVPB 1 g/50 mL premix  Status:  Discontinued     1 g100 mL/hr over 30 Minutes Intravenous Every 6 hours 05/14/14 0739 05/14/14 0855      Studies  No results found.  Objective  Filed Vitals:   05/13/14 2136 05/14/14 0137 05/14/14 0512 05/14/14 1023  BP: 127/45 148/63 142/53 134/55  Pulse: 69 77 78 77  Temp: 98.5 F (36.9 C) 98 F (36.7 C) 98.4 F (36.9 C) 98.7 F (37.1 C)  TempSrc: Oral Oral Oral Oral  Resp: 18 18 18 17   Height:      Weight:      SpO2: 98% 97% 98% 99%    Intake/Output Summary (Last 24 hours) at 05/14/14 1119 Last data filed at 05/13/14 1930  Gross per 24 hour  Intake    670 ml  Output      0 ml  Net    670 ml   Filed Weights   05/04/14 2116 05/05/14 0300  Weight: 56.7 kg (125 lb) 55.1 kg (121 lb 7.6 oz)   Exam:  General:  NAD  HEENT: no scleral icterus  Cardiovascular: RRR, 3/6 SEM  Respiratory: CTA biL  Abdomen: soft, non tender  MSK/Extremities: dark right foot, decreased pulses  Skin: no rashes  Neuro: non focal  Data Reviewed: Basic Metabolic Panel:  Recent Labs Lab 05/08/14 0353 05/11/14 0134 05/12/14 0845 05/14/14 0700  NA 143 141 141 136  K 3.8 4.5 4.7 4.3  CL 110 111 108 109  CO2 24 26 26 25   GLUCOSE 122* 150* 160* 125*  BUN 17 19 14 19   CREATININE 1.04 0.92 0.91 0.79  CALCIUM 8.4 8.1* 8.3* 8.0*   CBC:  Recent Labs Lab 05/10/14 0535 05/11/14 0134 05/12/14 0845 05/13/14 0534 05/14/14 0700  WBC 9.5 8.8 9.3 7.9 8.7    HGB 8.8* 8.7* 8.9* 7.6* 8.7*  HCT 27.1* 27.4* 28.5* 23.8* 26.6*  MCV 94.8 97.9 98.3 97.5 94.7  PLT 177 165 194 163 164    BNP (last 3 results)  Recent Labs  12/15/13 1403  PROBNP 429.0*   CBG:  Recent Labs Lab 05/13/14 0622 05/13/14 1126 05/13/14 1612 05/13/14 2140 05/14/14 0633  GLUCAP 113* 177* 185* 180* 123*    Recent Results (from the past 240 hour(s))  MRSA PCR Screening     Status: None   Collection Time: 05/10/14 11:16 PM  Result Value Ref Range Status   MRSA by PCR NEGATIVE NEGATIVE Final    Comment:        The GeneXpert MRSA Assay (FDA approved for NASAL specimens only), is one component of a comprehensive MRSA colonization surveillance program. It is not intended to diagnose MRSA infection nor to guide or monitor treatment for MRSA infections.      Scheduled Meds: . antiseptic oral rinse  7 mL Mouth Rinse q12n4p  . aspirin EC  81 mg Oral Daily  . atorvastatin  40 mg Oral  Daily  . bisoprolol  10 mg Oral Daily  . chlorhexidine  15 mL Mouth Rinse BID  . dextrose  25 mL Intravenous Once  . docusate sodium  100 mg Oral BID  . feeding supplement (ENSURE COMPLETE)  237 mL Oral TID WC  . feeding supplement (PRO-STAT SUGAR FREE 64)  30 mL Oral TID WC  . insulin aspart  0-5 Units Subcutaneous QHS  . insulin aspart  0-9 Units Subcutaneous TID WC  . losartan  50 mg Oral Daily  . memantine  28 mg Oral Daily  . potassium chloride  10 mEq Oral Daily   Continuous Infusions: . sodium chloride 75 mL/hr at 05/13/14 1550  . sodium chloride    . heparin 1,000 Units/hr (05/13/14 1001)  . lactated ringers      Marzetta Board, MD [facility] Pager [phone]. If 7 PM - 7 AM, please contact night-coverage at [email or web address], password [facility] 05/14/2014, 11:19 AM  LOS: 10 days

## 2014-05-15 DIAGNOSIS — I739 Peripheral vascular disease, unspecified: Secondary | ICD-10-CM | POA: Diagnosis not present

## 2014-05-15 DIAGNOSIS — Z7901 Long term (current) use of anticoagulants: Secondary | ICD-10-CM | POA: Diagnosis not present

## 2014-05-15 DIAGNOSIS — I2510 Atherosclerotic heart disease of native coronary artery without angina pectoris: Secondary | ICD-10-CM | POA: Diagnosis not present

## 2014-05-15 DIAGNOSIS — R4181 Age-related cognitive decline: Secondary | ICD-10-CM | POA: Diagnosis not present

## 2014-05-15 DIAGNOSIS — M6281 Muscle weakness (generalized): Secondary | ICD-10-CM | POA: Diagnosis not present

## 2014-05-15 DIAGNOSIS — Z89511 Acquired absence of right leg below knee: Secondary | ICD-10-CM | POA: Diagnosis not present

## 2014-05-15 DIAGNOSIS — Z950 Presence of cardiac pacemaker: Secondary | ICD-10-CM | POA: Diagnosis not present

## 2014-05-15 DIAGNOSIS — L97903 Non-pressure chronic ulcer of unspecified part of unspecified lower leg with necrosis of muscle: Secondary | ICD-10-CM | POA: Diagnosis not present

## 2014-05-15 DIAGNOSIS — M79671 Pain in right foot: Secondary | ICD-10-CM | POA: Diagnosis not present

## 2014-05-15 DIAGNOSIS — F0391 Unspecified dementia with behavioral disturbance: Secondary | ICD-10-CM | POA: Diagnosis not present

## 2014-05-15 DIAGNOSIS — I10 Essential (primary) hypertension: Secondary | ICD-10-CM | POA: Diagnosis not present

## 2014-05-15 DIAGNOSIS — E1152 Type 2 diabetes mellitus with diabetic peripheral angiopathy with gangrene: Secondary | ICD-10-CM | POA: Diagnosis not present

## 2014-05-15 DIAGNOSIS — I482 Chronic atrial fibrillation: Secondary | ICD-10-CM | POA: Diagnosis not present

## 2014-05-15 DIAGNOSIS — E0859 Diabetes mellitus due to underlying condition with other circulatory complications: Secondary | ICD-10-CM | POA: Diagnosis not present

## 2014-05-15 DIAGNOSIS — R238 Other skin changes: Secondary | ICD-10-CM | POA: Diagnosis not present

## 2014-05-15 DIAGNOSIS — I4892 Unspecified atrial flutter: Secondary | ICD-10-CM | POA: Diagnosis not present

## 2014-05-15 DIAGNOSIS — L98499 Non-pressure chronic ulcer of skin of other sites with unspecified severity: Secondary | ICD-10-CM | POA: Diagnosis not present

## 2014-05-15 DIAGNOSIS — I5042 Chronic combined systolic (congestive) and diastolic (congestive) heart failure: Secondary | ICD-10-CM | POA: Diagnosis not present

## 2014-05-15 DIAGNOSIS — I255 Ischemic cardiomyopathy: Secondary | ICD-10-CM | POA: Diagnosis not present

## 2014-05-15 DIAGNOSIS — F0390 Unspecified dementia without behavioral disturbance: Secondary | ICD-10-CM | POA: Diagnosis not present

## 2014-05-15 DIAGNOSIS — N390 Urinary tract infection, site not specified: Secondary | ICD-10-CM | POA: Diagnosis not present

## 2014-05-15 DIAGNOSIS — E1140 Type 2 diabetes mellitus with diabetic neuropathy, unspecified: Secondary | ICD-10-CM | POA: Diagnosis not present

## 2014-05-15 DIAGNOSIS — I509 Heart failure, unspecified: Secondary | ICD-10-CM | POA: Diagnosis not present

## 2014-05-15 DIAGNOSIS — I96 Gangrene, not elsewhere classified: Secondary | ICD-10-CM | POA: Diagnosis not present

## 2014-05-15 DIAGNOSIS — R319 Hematuria, unspecified: Secondary | ICD-10-CM | POA: Diagnosis not present

## 2014-05-15 LAB — HEPARIN LEVEL (UNFRACTIONATED): HEPARIN UNFRACTIONATED: 0.23 [IU]/mL — AB (ref 0.30–0.70)

## 2014-05-15 LAB — GLUCOSE, CAPILLARY
Glucose-Capillary: 122 mg/dL — ABNORMAL HIGH (ref 70–99)
Glucose-Capillary: 124 mg/dL — ABNORMAL HIGH (ref 70–99)

## 2014-05-15 LAB — PROTIME-INR
INR: 1.18 (ref 0.00–1.49)
Prothrombin Time: 15.2 seconds (ref 11.6–15.2)

## 2014-05-15 LAB — CBC
HCT: 29.3 % — ABNORMAL LOW (ref 36.0–46.0)
HEMOGLOBIN: 9.3 g/dL — AB (ref 12.0–15.0)
MCH: 30.9 pg (ref 26.0–34.0)
MCHC: 31.7 g/dL (ref 30.0–36.0)
MCV: 97.3 fL (ref 78.0–100.0)
Platelets: 182 10*3/uL (ref 150–400)
RBC: 3.01 MIL/uL — ABNORMAL LOW (ref 3.87–5.11)
RDW: 16.9 % — AB (ref 11.5–15.5)
WBC: 8.1 10*3/uL (ref 4.0–10.5)

## 2014-05-15 MED ORDER — HYDROCODONE-ACETAMINOPHEN 5-325 MG PO TABS: 1.0000 | ORAL_TABLET | Freq: Four times a day (QID) | ORAL | Status: DC | PRN

## 2014-05-15 MED ORDER — ASPIRIN 81 MG PO TBEC: 81.0000 mg | DELAYED_RELEASE_TABLET | Freq: Every day | ORAL | Status: DC

## 2014-05-15 MED ORDER — ENOXAPARIN SODIUM 60 MG/0.6ML ~~LOC~~ SOLN: 55.0000 mg | Freq: Two times a day (BID) | SUBCUTANEOUS | Status: DC

## 2014-05-15 NOTE — Progress Notes (Signed)
Report called to [facility] RN; patient ready for discharge today; daughter at bedside; discharge instructions reviewed with patient and her daughter.

## 2014-05-15 NOTE — Discharge Summary (Signed)
Physician Discharge Summary  Judith Zuniga HGD:[id number] DOB: 02-12-1921 DOA: 05/04/2014  PCP: Judith Gravel, MD  Admit date: 05/04/2014 Discharge date: 05/15/2014  Time spent: 35 minutes  Recommendations for Outpatient Follow-up:  1. Follow up with Dr. Sharol Zuniga in 1 week 2. Follow up with Dr. Maudie Zuniga as scheduled  3. Please continue Lovenox and Coumadin bridging until INR 2-3 then discontinue Lovenox 4. INR on discharge 1.18 5. Please monitor INR and CBC until therapeutic INR is achieved  Discharge Diagnoses:  Active Problems:   Hyperlipidemia   Benign hypertensive heart disease with heart failure   CAD, ARTERY BYPASS GRAFT   PACEMAKER, CRT-St. Jude   Long term current use of anticoagulant   Diastolic CHF, chronic   Atherosclerosis of native arteries of the extremities with ulceration   Delirium   Ischemic cardiomyopathy   Chronic atrial fibrillation   Diabetes mellitus   Systolic heart failure   Chronic systolic heart failure   DM (diabetes mellitus) type II uncontrolled, periph vascular disorder   HLD (hyperlipidemia)   Stroke  Discharge Condition: stable  Diet recommendation: heart healthy  Filed Weights   05/04/14 2116 05/05/14 0300  Weight: 56.7 kg (125 lb) 55.1 kg (121 lb 7.6 oz)   History of present illness:  Judith Zuniga is a 81 y.o. female with a history of CAD/Ischemic Cardiomyopathy, Complete AV Block S/P St Jude Biventricular Pacemaker, Chronic Atrial Fibrillation on Coumadin Rx, Systolic CHF, DM2, HTN, PAD, and Dementia who was brought to the ED via EMS due to symptoms of Increased Confusion, Slurring of her speech and a right facial droop noticed by her daughter at 3 pm during the day following her having an Angiogram and angioplasty of the Rt Lower Extremity by Interventional Radiology in short stay for an ischemic right foot.The history is per the daughter who reports that her mother's words were not making sense, and her speech was slurred.At baseline she has  mild dementia and is on Namenda Rx but lives alone and is able to perform all of her ADLs.  Hospital Course:  Atherosclerosis of native arteries of the extremities with ulceration and gangrene s/p transtibial amputation 1/27 - Initially patient underwent a vascular Procedure done by Interventional Radiology Dr. Laurence Zuniga 1/20 , status post right lower extremity arteriogram and revascularization (PTA) and atherectomy of PT and atherectomy, and unsuccessful recanalization of AT. - Orthopedics were consulted, patient noted to have worsening of her ischemic changes, now s/p transtibial Amputation 1/27 - Does not appear to be infected, so no indication for antibiotics, afebrile and without leukocytosis  ABLA - post op, slight drop in Hb 1/29, s/p 1U pRBC with appropriate increase, no bleeding from surgical site, dressing intact, suspect dilutional/ABLA,  - hemoglobin stable on heparin gtt, transitioned to Lovenox and Coumadin on 1/30, to continue bridging until INR therapeutic Acute encephalopathy, facial droop, slurred speech - Neurology consulted and followed patient while hospitalized - Unclear if this is related to acute CVA versus TIA or not, Zuniga unable to do MRI secondary to pacemaker, but this is most likely related to acute encephalopathy from medication and procedure, patient has no residual neurologic deficits. - 2-D echo EF of 55%, multiple valvular disease.,  - carotid Dopplers,40-59% right internal carotid artery stenosis by peak systolic velocity, 96-22% by ICA/CCA ratio, and 2-97% by end diastolic velocities. The left internal carotid artery exhbits 40-59% stenosis by peak systolic velocity, and 9-89% stenosis by end diastolic velocity and ICA/CCA ratio. Vertebral arteries are patent with antegrade  flow. - hemoglobin A1c is 6.3 - Continue with aspirin and statin -LDL 63 Ischemic cardiomyopathy/CAD, ARTERY BYPASS GRAFT/Systolic CHF - On Bisoprolol, Losartan, and  Atrovastatin Chronic atrial fibrillation - coumadin on hold for surgery, restarted anticoagulation, continue Lovenox and Coumadin Diastolic CHF, chronic- On Bisoprolol, and Losartan Hyperlipidemia -On Atorvastatin Rx Benign hypertensive heart disease with heart failure - On Bisoprolol, Losartan, Rx PACEMAKER, CRT-St. Jude- due to Complete Heart Block Hypokalemia -Repleted, continue to monitor DM2 - Hemoglobin A1c 6.3  Procedures:  Transtibial amputation 1/27   Consultations:  IR  Neurology  Orthopedic surgery   Discharge Exam: Filed Vitals:   05/14/14 1849 05/14/14 2140 05/15/14 0140 05/15/14 0600  BP: 147/49 132/64 128/66 161/62  Pulse: 77 77 79 73  Temp: 98.8 F (37.1 C) 98.2 F (36.8 C) 98 F (36.7 C) 97.5 F (36.4 C)  TempSrc: Oral Oral Oral Oral  Resp: 17 16 16 16   Height:      Weight:      SpO2: 98% 97% 98% 98%   General: NAD Cardiovascular: RRR Respiratory: CTA biL  Discharge Instructions    Medication List    TAKE these medications        aspirin 81 MG EC tablet  Take 1 tablet (81 mg total) by mouth daily.     atorvastatin 40 MG tablet  Commonly known as:  LIPITOR  Take 40 mg by mouth daily.     bisoprolol 10 MG tablet  Commonly known as:  ZEBETA  Take 1 tablet (10 mg total) by mouth daily.     enoxaparin 60 MG/0.6ML injection  Commonly known as:  LOVENOX  Inject 0.55 mLs (55 mg total) into the skin every 12 (twelve) hours.     furosemide 40 MG tablet  Commonly known as:  LASIX  TAKE1/2 TABLET BY MOUTH EVERY DAY     glimepiride 1 MG tablet  Commonly known as:  AMARYL  Take 1 mg by mouth daily with breakfast.     HYDROcodone-acetaminophen 5-325 MG per tablet  Commonly known as:  NORCO/VICODIN  Take 1 tablet by mouth every 6 (six) hours as needed for moderate pain.     losartan 50 MG tablet  Commonly known as:  COZAAR  Take 50 mg by mouth daily.     NAMENDA XR 28 MG Cp24 24 hr capsule  Generic drug:  memantine  Take 1 capsule  by mouth daily.     nitroGLYCERIN 0.2 mg/hr patch  Commonly known as:  NITRODUR - Dosed in mg/24 hr  Place 0.2 mg onto the skin daily.     pentoxifylline 400 MG CR tablet  Commonly known as:  TRENTAL  Take 400 mg by mouth 3 (three) times daily with meals.     potassium chloride 10 MEQ tablet  Commonly known as:  K-DUR  TAKE ONE TABLET BY MOUTH ONCE DAILY     warfarin 5 MG tablet  Commonly known as:  COUMADIN  Take 1 tablet (5 mg total) by mouth as directed.       Follow-up Information    Follow up with DUDA,MARCUS V, MD In 1 week.   Specialty:  Orthopedic Surgery   Contact information:   [place] [address] [phone]       Follow up with Judith Gravel, MD. Schedule an appointment as soon as possible for a visit in 1 month.   Specialty:  Internal Medicine   Contact information:   [address] [place] [address]  [phone]       The results of significant diagnostics from this hospitalization (including imaging, microbiology, ancillary and laboratory) are listed below for reference.    Significant Diagnostic Studies: Ct Head Wo Contrast  05/05/2014   CLINICAL DATA:  Subsequent evaluation of stroke 24 hr after administration of TPA  EXAM: CT HEAD WITHOUT CONTRAST  TECHNIQUE: Contiguous axial images were obtained from the base of the skull through the vertex without intravenous contrast.  COMPARISON:  05/04/2014  FINDINGS: There is no evidence of hemorrhage or extra-axial fluid. Again identified is moderate diffuse atrophy and moderate low attenuation diffusely in the deep white matter consistent with chronic small vessel ischemic change. No evidence of vascular territory infarct. No hydrocephalus or mass.  IMPRESSION: No significant change from yesterday's examination. No hemorrhage or extra-axial fluid.   Electronically Signed   By: Skipper Cliche M.D.   On: 05/05/2014 10:18   Ct Head Wo Contrast  05/04/2014   CLINICAL DATA:   Right-sided facial droop and increased confusion.  EXAM: CT HEAD WITHOUT CONTRAST  TECHNIQUE: Contiguous axial images were obtained from the base of the skull through the vertex without intravenous contrast.  COMPARISON:  07/07/2011  FINDINGS: Ventricles are normal configuration. There is ventricular and sulcal enlargement reflecting age related volume loss. No hydrocephalus.  No parenchymal masses or mass effect.  There is no evidence of a recent cortical infarct.  Patchy white matter hypoattenuation noted consistent with mild chronic microvascular ischemic change, stable.  No extra-axial masses or abnormal fluid collections.  There is no intracranial hemorrhage.  Visualized sinuses and mastoid air cells are clear.  IMPRESSION: 1. No acute intracranial abnormalities. 2. Age related volume loss. Mild chronic microvascular ischemic change.   Electronically Signed   By: Lajean Manes M.D.   On: 05/04/2014 23:22   Ir Angiogram Extremity Right  05/05/2014   CLINICAL DATA:  81 year old female with Rutherford stage 5 critical limb ischemia involving the right forefoot and great toe in the distribution of the anterior tibial artery. Has a pacemaker and cannot undergo MRI angiography. She presents for diagnostic angiogram and possible intervention to revascularize the anterior tibial angio zone. Her Coumadin has been held for the past several days. Her INR is 1.6 today.  EXAM: RIGHT EXTREMITY ARTERIOGRAPHY; IR TIB-PERO ART ATHEREC INC PTA; IR ULTRASOUND GUIDANCE VASC ACCESS LEFT  Date: 05/05/2014  PROCEDURE: 1. Ultrasound-guided puncture of the left common femoral artery. 2. Catheterization of the distal abdominal aorta with a distal abdominal aortic and pelvic arteriography. 3. Often over catheterization of the right common femoral artery. Right lower extremity runoff arteriogram. 4. Catheterization of the anterior tibial artery with arteriogram 5. Attempted recanalization of the chronically occluded right anterior  tibial artery. 6. Catheterization of the posterior tibial artery. Atherectomy of the posterior tibial artery. 7. Angioplasty of the posterior tibial artery to 2.5 mm. Interventional Radiologist:  Criselda Peaches, MD  ANESTHESIA/SEDATION: Moderate (conscious) sedation was used. 1 mg Versed, 225 mcg Fentanyl were administered intravenously. The patient's vital signs were monitored continuously by radiology nursing throughout the procedure.  Sedation Time: 250 minutes  MEDICATIONS: Throughout the course of the procedure, nitroglycerin, heparin and 2 mg of TPA were administered. Additionally, 2 g Ancef were administered intravenously.  FLUOROSCOPY TIME:  66 min 42 seconds  643.7 mGy  CONTRAST:  173mL VISIPAQUE IODIXANOL 320 MG/ML IV SOLN  TECHNIQUE: Informed consent was obtained from the patient following explanation of the procedure, risks, benefits and alternatives. The patient understands, agrees and consents  for the procedure. All questions were addressed. A time out was performed.  Maximal barrier sterile technique utilized including caps, mask, sterile gowns, sterile gloves, large sterile drape, hand hygiene, and Betadine skin prep.  The left groin was interrogated with ultrasound. The common femoral artery was found to be widely patent. Local anesthesia was attained by infiltration with 1% lidocaine. A small dermatotomy was made. Under real-time sonographic guidance, the left common femoral artery was punctured with a 21 gauge micropuncture needle. With the assistance of a 4 [place] transitional micro sheath, the micro wire was exchanged for a Bentson wire which was advanced in the abdominal aorta. Over this, a working 4 [place] vascular sheath was advanced of the common femoral artery. A 4 French pigtail catheter was then advanced into the distal aorta and pelvic aortography was performed. The distal aorta is widely patent. The bilateral iliac arterial systems are widely patent although mildly tortuous. No  significant stenosis.  Using a 4 French glide catheter and glidewire, the catheter was advanced often over and into the common femoral artery. The Bentson wire was advanced into the superficial femoral artery. A cook flexor 4 French vascular sheath was then advanced up- [place] the aortic bifurcation and into the superficial femoral artery. A right lower extremity runoff arteriogram was then performed.  Inflow: Widely patent bilateral iliac systems and common femoral arteries.  Outflow: Widely patent right superficial femoral and popliteal arteries.  Runoff: The right anterior tibial artery completely occludes approximately 2 cm beyond the origin. There is a hyper trophic collateral vessel. In the distal lower leg there appears to be faint recanalization of the anterior tibial artery. The dorsalis pedis artery is the red light at the level of the ankle. The tibioperoneal trunk is widely patent. The peroneal artery becomes markedly attenuated approximately 2 cm beyond the origin. The vessel continues into the mid calf but is very small. The posterior tibial artery demonstrates multifocal disease. There is a short segment high-grade stenosis in the proximal third of the artery and a second short segment high-grade stenosis in the mid third of the artery. Distally, there are tandem moderate stenoses in the distal third of the artery. Additionally, there is focal stenosis in artery as it is passes posterior to the medial malleolus. On the lateral view, there is very little flow to the forefoot.  The Bentson wire was advanced into the popliteal artery and the 90 cm 4 French flexor sheath was advanced of the popliteal artery. Zuniga the planned intervention, the patient was heparinized with a weight based dose of an initial 4000 units of heparin. ACTs were then drawn and additional heparinization was administered as needed to maintain anticoagulation. Using a 2.6 [place] CXI support catheter and Whisper Wire attempts were  made to recanalized the right anterior tibial artery. This CXR catheter was advanced into the anterior tibial artery and arteriography was performed. Ultimately, I was only able to progress into the large hypertrophic collateral rather than the true anterior tibial lumen. Consideration was made towards pedal puncture for retrograde approach. However, the patient did not respond well to the initial dose of the Versed and fentanyl for conscious sedation. Her dementia was unmasked and she became agitated and combative. Although we were able to calm her down enough to continue with the procedure, she continued to move her lower extremities randomly. Ultrasound interrogation of the dorsalis pedis demonstrates a very small and calcified vessel. Pedal access is not possible Zuniga her current status.  Therefore, I elected to  provide optimal revascularization of the posterior tibial artery in the hopes that this will provide additional flow through the plantar arch and into the forefoot.  Therefore, the CX site catheter in was for wire were advanced into the posterior tibial artery. Arteriography demonstrate did almost no flow beyond the initial stenosis which was an interval change compared to the initial angiogram. Therefore, this was clip quickly crossed with a wire. Nitroglycerin was administered intra-arterially. The stenosis was angioplastied to 2 mm using a 2 x 200 mm are mild a balloon. Followup angiography confirmed restoration of flow. Therefore, pleural arteriography of the posterior tibial artery was performed confirming a findings as described above.  The was per wire was advanced to the level of the medial malleolus. Unfortunately, I could not get the wire around the tight stenosis at the malleolus. The calcified stenoses throughout the posterior tibial artery with an after rectum eyes using a the [facility] CSI orbital atherectomy device with the 1.25 mm micro Crown. Atherectomy was performed only on the low  setting. Following atherectomy, the posterior tibial artery was angioplastied first to 2 mm, and then to 2.5 mm using first a 2 x 150 and then a 2.5 x 150 mm sterling balloon. Inflations were held at 4 atmospheres for 2 min. Followup arteriography demonstrates significantly improved patency throughout the posterior tibial artery. Flow is brisk. There is residual significant stenosis at the medial malleolus in the on treated portion of the vessel. Flow remains patent in the medial and lateral calcaneal branches as well as in the proximal and distal arch is. Flow has improved compared to the pre intervention angiogram.  A final ACT was drawn. The catheters and wires were removed. The sheath was carefully brought back into the left external iliac artery and a limited left common femoral arteriogram was performed confirming access in the common femoral artery. Hemostasis was attained with the assistance of a 5 French Mynx Grip closure device.  COMPLICATIONS: None  IMPRESSION: 1. Right lower extremity runoff demonstrates widely patent inflow and outflow. 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 The plantar arch remains intact. 2. Unsuccessful attempt at recanalization of the chronically occluded anterior tibial artery. Unfortunately, due to patient agitation and motion, pedal access for retrograde recanalization could not be performed. 3. Successful atherectomy and angioplasty of the diseased posterior tibial artery to 2.5 mm with significantly improved flow into the foot and plantar arch.  PLAN: 1. Bed rest for 4 hr prior to discharge home. 2. Resume Coumadin tomorrow. 3. Return to clinic in 2 weeks for repeat ABI and PVRs as well as wound check. 4. Will see patient every 6 weeks thereafter until wound has healed. 5. If the wound does not heal, could consider a second attempt at  recanalization of the anterior tibial artery although a a different sedation strategy would have to be employed. I would discuss the risks and benefits of a second attempt Zuniga the patient's underlying dementia and confusion with benzodiazepines at length with her daughter. Signed,  Criselda Peaches, MD  [facility]  [facility]   Electronically Signed   By: Jacqulynn Cadet M.D.   On: 05/05/2014 18:00   Victoria Pta Mod Sed  05/05/2014   CLINICAL DATA:  81 year old female with Rutherford stage 5 critical limb ischemia involving the right forefoot and great toe in the distribution  of the anterior tibial artery. Has a pacemaker and cannot undergo MRI angiography. She presents for diagnostic angiogram and possible intervention to revascularize the anterior tibial angio zone. Her Coumadin has been held for the past several days. Her INR is 1.6 today.  EXAM: RIGHT EXTREMITY ARTERIOGRAPHY; IR TIB-PERO ART ATHEREC INC PTA; IR ULTRASOUND GUIDANCE VASC ACCESS LEFT  Date: 05/05/2014  PROCEDURE: 1. Ultrasound-guided puncture of the left common femoral artery. 2. Catheterization of the distal abdominal aorta with a distal abdominal aortic and pelvic arteriography. 3. Often over catheterization of the right common femoral artery. Right lower extremity runoff arteriogram. 4. Catheterization of the anterior tibial artery with arteriogram 5. Attempted recanalization of the chronically occluded right anterior tibial artery. 6. Catheterization of the posterior tibial artery. Atherectomy of the posterior tibial artery. 7. Angioplasty of the posterior tibial artery to 2.5 mm. Interventional Radiologist:  Criselda Peaches, MD  ANESTHESIA/SEDATION: Moderate (conscious) sedation was used. 1 mg Versed, 225 mcg Fentanyl were administered intravenously. The patient's vital signs were monitored continuously by radiology nursing throughout the procedure.  Sedation  Time: 250 minutes  MEDICATIONS: Throughout the course of the procedure, nitroglycerin, heparin and 2 mg of TPA were administered. Additionally, 2 g Ancef were administered intravenously.  FLUOROSCOPY TIME:  66 min 42 seconds  643.7 mGy  CONTRAST:  149mL VISIPAQUE IODIXANOL 320 MG/ML IV SOLN  TECHNIQUE: Informed consent was obtained from the patient following explanation of the procedure, risks, benefits and alternatives. The patient understands, agrees and consents for the procedure. All questions were addressed. A time out was performed.  Maximal barrier sterile technique utilized including caps, mask, sterile gowns, sterile gloves, large sterile drape, hand hygiene, and Betadine skin prep.  The left groin was interrogated with ultrasound. The common femoral artery was found to be widely patent. Local anesthesia was attained by infiltration with 1% lidocaine. A small dermatotomy was made. Under real-time sonographic guidance, the left common femoral artery was punctured with a 21 gauge micropuncture needle. With the assistance of a 4 [place] transitional micro sheath, the micro wire was exchanged for a Bentson wire which was advanced in the abdominal aorta. Over this, a working 4 [place] vascular sheath was advanced of the common femoral artery. A 4 French pigtail catheter was then advanced into the distal aorta and pelvic aortography was performed. The distal aorta is widely patent. The bilateral iliac arterial systems are widely patent although mildly tortuous. No significant stenosis.  Using a 4 French glide catheter and glidewire, the catheter was advanced often over and into the common femoral artery. The Bentson wire was advanced into the superficial femoral artery. A cook flexor 4 French vascular sheath was then advanced up- [place] the aortic bifurcation and into the superficial femoral artery. A right lower extremity runoff arteriogram was then performed.  Inflow: Widely patent bilateral iliac systems and  common femoral arteries.  Outflow: Widely patent right superficial femoral and popliteal arteries.  Runoff: The right anterior tibial artery completely occludes approximately 2 cm beyond the origin. There is a hyper trophic collateral vessel. In the distal lower leg there appears to be faint recanalization of the anterior tibial artery. The dorsalis pedis artery is the red light at the level of the ankle. The tibioperoneal trunk is widely patent. The peroneal artery becomes markedly attenuated approximately 2 cm beyond the origin. The vessel continues into the mid calf but is very small. The posterior tibial artery demonstrates multifocal disease. There is a short segment high-grade stenosis in the proximal  third of the artery and a second short segment high-grade stenosis in the mid third of the artery. Distally, there are tandem moderate stenoses in the distal third of the artery. Additionally, there is focal stenosis in artery as it is passes posterior to the medial malleolus. On the lateral view, there is very little flow to the forefoot.  The Bentson wire was advanced into the popliteal artery and the 90 cm 4 French flexor sheath was advanced of the popliteal artery. Zuniga the planned intervention, the patient was heparinized with a weight based dose of an initial 4000 units of heparin. ACTs were then drawn and additional heparinization was administered as needed to maintain anticoagulation. Using a 2.6 [place] CXI support catheter and Whisper Wire attempts were made to recanalized the right anterior tibial artery. This CXR catheter was advanced into the anterior tibial artery and arteriography was performed. Ultimately, I was only able to progress into the large hypertrophic collateral rather than the true anterior tibial lumen. Consideration was made towards pedal puncture for retrograde approach. However, the patient did not respond well to the initial dose of the Versed and fentanyl for conscious sedation.  Her dementia was unmasked and she became agitated and combative. Although we were able to calm her down enough to continue with the procedure, she continued to move her lower extremities randomly. Ultrasound interrogation of the dorsalis pedis demonstrates a very small and calcified vessel. Pedal access is not possible Zuniga her current status.  Therefore, I elected to provide optimal revascularization of the posterior tibial artery in the hopes that this will provide additional flow through the plantar arch and into the forefoot.  Therefore, the CX site catheter in was for wire were advanced into the posterior tibial artery. Arteriography demonstrate did almost no flow beyond the initial stenosis which was an interval change compared to the initial angiogram. Therefore, this was clip quickly crossed with a wire. Nitroglycerin was administered intra-arterially. The stenosis was angioplastied to 2 mm using a 2 x 200 mm are mild a balloon. Followup angiography confirmed restoration of flow. Therefore, pleural arteriography of the posterior tibial artery was performed confirming a findings as described above.  The was per wire was advanced to the level of the medial malleolus. Unfortunately, I could not get the wire around the tight stenosis at the malleolus. The calcified stenoses throughout the posterior tibial artery with an after rectum eyes using a the [facility] CSI orbital atherectomy device with the 1.25 mm micro Crown. Atherectomy was performed only on the low setting. Following atherectomy, the posterior tibial artery was angioplastied first to 2 mm, and then to 2.5 mm using first a 2 x 150 and then a 2.5 x 150 mm sterling balloon. Inflations were held at 4 atmospheres for 2 min. Followup arteriography demonstrates significantly improved patency throughout the posterior tibial artery. Flow is brisk. There is residual significant stenosis at the medial malleolus in the on treated portion of the vessel. Flow  remains patent in the medial and lateral calcaneal branches as well as in the proximal and distal arch is. Flow has improved compared to the pre intervention angiogram.  A final ACT was drawn. The catheters and wires were removed. The sheath was carefully brought back into the left external iliac artery and a limited left common femoral arteriogram was performed confirming access in the common femoral artery. Hemostasis was attained with the assistance of a 5 French Mynx Grip closure device.  COMPLICATIONS: None  IMPRESSION: 1. Right lower extremity  runoff demonstrates widely patent inflow and outflow. 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 The plantar arch remains intact. 2. Unsuccessful attempt at recanalization of the chronically occluded anterior tibial artery. Unfortunately, due to patient agitation and motion, pedal access for retrograde recanalization could not be performed. 3. Successful atherectomy and angioplasty of the diseased posterior tibial artery to 2.5 mm with significantly improved flow into the foot and plantar arch.  PLAN: 1. Bed rest for 4 hr prior to discharge home. 2. Resume Coumadin tomorrow. 3. Return to clinic in 2 weeks for repeat ABI and PVRs as well as wound check. 4. Will see patient every 6 weeks thereafter until wound has healed. 5. If the wound does not heal, could consider a second attempt at recanalization of the anterior tibial artery although a a different sedation strategy would have to be employed. I would discuss the risks and benefits of a second attempt Zuniga the patient's underlying dementia and confusion with benzodiazepines at length with her daughter. Signed,  Criselda Peaches, MD  [facility]  [facility]   Electronically Signed   By: Jacqulynn Cadet M.D.   On: 05/05/2014  18:00   Ir US Guide Vasc Access Left  05/05/2014   CLINICAL DATA:  81 year old female with Rutherford stage 5 critical limb ischemia involving the right forefoot and great toe in the distribution of the anterior tibial artery. Has a pacemaker and cannot undergo MRI angiography. She presents for diagnostic angiogram and possible intervention to revascularize the anterior tibial angio zone. Her Coumadin has been held for the past several days. Her INR is 1.6 today.  EXAM: RIGHT EXTREMITY ARTERIOGRAPHY; IR TIB-PERO ART ATHEREC INC PTA; IR ULTRASOUND GUIDANCE VASC ACCESS LEFT  Date: 05/05/2014  PROCEDURE: 1. Ultrasound-guided puncture of the left common femoral artery. 2. Catheterization of the distal abdominal aorta with a distal abdominal aortic and pelvic arteriography. 3. Often over catheterization of the right common femoral artery. Right lower extremity runoff arteriogram. 4. Catheterization of the anterior tibial artery with arteriogram 5. Attempted recanalization of the chronically occluded right anterior tibial artery. 6. Catheterization of the posterior tibial artery. Atherectomy of the posterior tibial artery. 7. Angioplasty of the posterior tibial artery to 2.5 mm. Interventional Radiologist:  Criselda Peaches, MD  ANESTHESIA/SEDATION: Moderate (conscious) sedation was used. 1 mg Versed, 225 mcg Fentanyl were administered intravenously. The patient's vital signs were monitored continuously by radiology nursing throughout the procedure.  Sedation Time: 250 minutes  MEDICATIONS: Throughout the course of the procedure, nitroglycerin, heparin and 2 mg of TPA were administered. Additionally, 2 g Ancef were administered intravenously.  FLUOROSCOPY TIME:  66 min 42 seconds  643.7 mGy  CONTRAST:  175mL VISIPAQUE IODIXANOL 320 MG/ML IV SOLN  TECHNIQUE: Informed consent was obtained from the patient following explanation of the procedure, risks, benefits and alternatives. The patient understands, agrees and  consents for the procedure. All questions were addressed. A time out was performed.  Maximal barrier sterile technique utilized including caps, mask, sterile gowns, sterile gloves, large sterile drape, hand hygiene, and Betadine skin prep.  The left groin was interrogated with ultrasound. The common femoral artery was found to be widely patent. Local anesthesia was attained by infiltration with 1% lidocaine. A small dermatotomy was made. Under real-time sonographic guidance, the left common femoral artery was punctured with a 21 gauge micropuncture needle. With the assistance  of a 4 [place] transitional micro sheath, the micro wire was exchanged for a Bentson wire which was advanced in the abdominal aorta. Over this, a working 4 [place] vascular sheath was advanced of the common femoral artery. A 4 French pigtail catheter was then advanced into the distal aorta and pelvic aortography was performed. The distal aorta is widely patent. The bilateral iliac arterial systems are widely patent although mildly tortuous. No significant stenosis.  Using a 4 French glide catheter and glidewire, the catheter was advanced often over and into the common femoral artery. The Bentson wire was advanced into the superficial femoral artery. A cook flexor 4 French vascular sheath was then advanced up- [place] the aortic bifurcation and into the superficial femoral artery. A right lower extremity runoff arteriogram was then performed.  Inflow: Widely patent bilateral iliac systems and common femoral arteries.  Outflow: Widely patent right superficial femoral and popliteal arteries.  Runoff: The right anterior tibial artery completely occludes approximately 2 cm beyond the origin. There is a hyper trophic collateral vessel. In the distal lower leg there appears to be faint recanalization of the anterior tibial artery. The dorsalis pedis artery is the red light at the level of the ankle. The tibioperoneal trunk is widely patent. The peroneal  artery becomes markedly attenuated approximately 2 cm beyond the origin. The vessel continues into the mid calf but is very small. The posterior tibial artery demonstrates multifocal disease. There is a short segment high-grade stenosis in the proximal third of the artery and a second short segment high-grade stenosis in the mid third of the artery. Distally, there are tandem moderate stenoses in the distal third of the artery. Additionally, there is focal stenosis in artery as it is passes posterior to the medial malleolus. On the lateral view, there is very little flow to the forefoot.  The Bentson wire was advanced into the popliteal artery and the 90 cm 4 French flexor sheath was advanced of the popliteal artery. Zuniga the planned intervention, the patient was heparinized with a weight based dose of an initial 4000 units of heparin. ACTs were then drawn and additional heparinization was administered as needed to maintain anticoagulation. Using a 2.6 [place] CXI support catheter and Whisper Wire attempts were made to recanalized the right anterior tibial artery. This CXR catheter was advanced into the anterior tibial artery and arteriography was performed. Ultimately, I was only able to progress into the large hypertrophic collateral rather than the true anterior tibial lumen. Consideration was made towards pedal puncture for retrograde approach. However, the patient did not respond well to the initial dose of the Versed and fentanyl for conscious sedation. Her dementia was unmasked and she became agitated and combative. Although we were able to calm her down enough to continue with the procedure, she continued to move her lower extremities randomly. Ultrasound interrogation of the dorsalis pedis demonstrates a very small and calcified vessel. Pedal access is not possible Zuniga her current status.  Therefore, I elected to provide optimal revascularization of the posterior tibial artery in the hopes that this will  provide additional flow through the plantar arch and into the forefoot.  Therefore, the CX site catheter in was for wire were advanced into the posterior tibial artery. Arteriography demonstrate did almost no flow beyond the initial stenosis which was an interval change compared to the initial angiogram. Therefore, this was clip quickly crossed with a wire. Nitroglycerin was administered intra-arterially. The stenosis was angioplastied to 2 mm using a 2 x  200 mm are mild a balloon. Followup angiography confirmed restoration of flow. Therefore, pleural arteriography of the posterior tibial artery was performed confirming a findings as described above.  The was per wire was advanced to the level of the medial malleolus. Unfortunately, I could not get the wire around the tight stenosis at the malleolus. The calcified stenoses throughout the posterior tibial artery with an after rectum eyes using a the [facility] CSI orbital atherectomy device with the 1.25 mm micro Crown. Atherectomy was performed only on the low setting. Following atherectomy, the posterior tibial artery was angioplastied first to 2 mm, and then to 2.5 mm using first a 2 x 150 and then a 2.5 x 150 mm sterling balloon. Inflations were held at 4 atmospheres for 2 min. Followup arteriography demonstrates significantly improved patency throughout the posterior tibial artery. Flow is brisk. There is residual significant stenosis at the medial malleolus in the on treated portion of the vessel. Flow remains patent in the medial and lateral calcaneal branches as well as in the proximal and distal arch is. Flow has improved compared to the pre intervention angiogram.  A final ACT was drawn. The catheters and wires were removed. The sheath was carefully brought back into the left external iliac artery and a limited left common femoral arteriogram was performed confirming access in the common femoral artery. Hemostasis was attained with the assistance of a 5  French Mynx Grip closure device.  COMPLICATIONS: None  IMPRESSION: 1. Right lower extremity runoff demonstrates widely patent inflow and outflow. 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 The plantar arch remains intact. 2. Unsuccessful attempt at recanalization of the chronically occluded anterior tibial artery. Unfortunately, due to patient agitation and motion, pedal access for retrograde recanalization could not be performed. 3. Successful atherectomy and angioplasty of the diseased posterior tibial artery to 2.5 mm with significantly improved flow into the foot and plantar arch.  PLAN: 1. Bed rest for 4 hr prior to discharge home. 2. Resume Coumadin tomorrow. 3. Return to clinic in 2 weeks for repeat ABI and PVRs as well as wound check. 4. Will see patient every 6 weeks thereafter until wound has healed. 5. If the wound does not heal, could consider a second attempt at recanalization of the anterior tibial artery although a a different sedation strategy would have to be employed. I would discuss the risks and benefits of a second attempt Zuniga the patient's underlying dementia and confusion with benzodiazepines at length with her daughter. Signed,  Criselda Peaches, MD  [facility]  [facility]   Electronically Signed   By: Jacqulynn Cadet M.D.   On: 05/05/2014 18:00    Microbiology: Recent Results (from the past 240 hour(s))  MRSA PCR Screening     Status: None   Collection Time: 05/10/14 11:16 PM  Result Value Ref Range Status   MRSA by PCR NEGATIVE NEGATIVE Final    Comment:        The GeneXpert MRSA Assay (FDA approved for NASAL specimens only), is one component of a comprehensive MRSA colonization surveillance program. It is not intended to diagnose MRSA infection nor to guide or monitor treatment  for MRSA infections.      Labs: Basic Metabolic Panel:  Recent Labs Lab 05/11/14 0134 05/12/14 0845 05/14/14 0700  NA 141 141 136  K 4.5 4.7 4.3  CL 111  108 109  CO2 26 26 25   GLUCOSE 150* 160* 125*  BUN 19 14 19   CREATININE 0.92 0.91 0.79  CALCIUM 8.1* 8.3* 8.0*   CBC:  Recent Labs Lab 05/11/14 0134 05/12/14 0845 05/13/14 0534 05/14/14 0700 05/15/14 0402  WBC 8.8 9.3 7.9 8.7 8.1  HGB 8.7* 8.9* 7.6* 8.7* 9.3*  HCT 27.4* 28.5* 23.8* 26.6* 29.3*  MCV 97.9 98.3 97.5 94.7 97.3  PLT 165 194 163 164 182   BNP: BNP (last 3 results)  Recent Labs  12/15/13 1403  PROBNP 429.0*   CBG:  Recent Labs Lab 05/14/14 0633 05/14/14 1127 05/14/14 1645 05/14/14 2139 05/15/14 0623  GLUCAP 123* 129* 134* 118* 122*    Signed:  Marzetta Board  [facility] 05/15/2014, 8:52 AM

## 2014-05-16 DIAGNOSIS — Z89511 Acquired absence of right leg below knee: Secondary | ICD-10-CM | POA: Diagnosis not present

## 2014-05-16 DIAGNOSIS — I4892 Unspecified atrial flutter: Secondary | ICD-10-CM | POA: Diagnosis not present

## 2014-05-16 DIAGNOSIS — Z7901 Long term (current) use of anticoagulants: Secondary | ICD-10-CM | POA: Diagnosis not present

## 2014-05-17 DIAGNOSIS — Z89511 Acquired absence of right leg below knee: Secondary | ICD-10-CM | POA: Diagnosis not present

## 2014-05-17 DIAGNOSIS — I2510 Atherosclerotic heart disease of native coronary artery without angina pectoris: Secondary | ICD-10-CM | POA: Diagnosis not present

## 2014-05-17 DIAGNOSIS — I482 Chronic atrial fibrillation: Secondary | ICD-10-CM | POA: Diagnosis not present

## 2014-05-17 DIAGNOSIS — I255 Ischemic cardiomyopathy: Secondary | ICD-10-CM | POA: Diagnosis not present

## 2014-05-20 DIAGNOSIS — E1140 Type 2 diabetes mellitus with diabetic neuropathy, unspecified: Secondary | ICD-10-CM | POA: Diagnosis not present

## 2014-05-20 DIAGNOSIS — Z7901 Long term (current) use of anticoagulants: Secondary | ICD-10-CM | POA: Diagnosis not present

## 2014-05-20 DIAGNOSIS — F0391 Unspecified dementia with behavioral disturbance: Secondary | ICD-10-CM | POA: Diagnosis not present

## 2014-05-20 DIAGNOSIS — I482 Chronic atrial fibrillation: Secondary | ICD-10-CM | POA: Diagnosis not present

## 2014-05-20 DIAGNOSIS — I509 Heart failure, unspecified: Secondary | ICD-10-CM | POA: Diagnosis not present

## 2014-05-20 DIAGNOSIS — Z89511 Acquired absence of right leg below knee: Secondary | ICD-10-CM | POA: Diagnosis not present

## 2014-05-20 DIAGNOSIS — I739 Peripheral vascular disease, unspecified: Secondary | ICD-10-CM | POA: Diagnosis not present

## 2014-05-23 DIAGNOSIS — I482 Chronic atrial fibrillation: Secondary | ICD-10-CM | POA: Diagnosis not present

## 2014-05-23 DIAGNOSIS — Z89511 Acquired absence of right leg below knee: Secondary | ICD-10-CM | POA: Diagnosis not present

## 2014-05-23 DIAGNOSIS — Z7901 Long term (current) use of anticoagulants: Secondary | ICD-10-CM | POA: Diagnosis not present

## 2014-05-25 DIAGNOSIS — I482 Chronic atrial fibrillation: Secondary | ICD-10-CM | POA: Diagnosis not present

## 2014-05-25 DIAGNOSIS — Z89511 Acquired absence of right leg below knee: Secondary | ICD-10-CM | POA: Diagnosis not present

## 2014-05-25 DIAGNOSIS — Z7901 Long term (current) use of anticoagulants: Secondary | ICD-10-CM | POA: Diagnosis not present

## 2014-06-01 DIAGNOSIS — I739 Peripheral vascular disease, unspecified: Secondary | ICD-10-CM | POA: Diagnosis not present

## 2014-06-01 DIAGNOSIS — I482 Chronic atrial fibrillation: Secondary | ICD-10-CM | POA: Diagnosis not present

## 2014-06-01 DIAGNOSIS — Z7901 Long term (current) use of anticoagulants: Secondary | ICD-10-CM | POA: Diagnosis not present

## 2014-06-01 DIAGNOSIS — Z89511 Acquired absence of right leg below knee: Secondary | ICD-10-CM | POA: Diagnosis not present

## 2014-06-03 DIAGNOSIS — Z7901 Long term (current) use of anticoagulants: Secondary | ICD-10-CM | POA: Diagnosis not present

## 2014-06-03 DIAGNOSIS — Z89511 Acquired absence of right leg below knee: Secondary | ICD-10-CM | POA: Diagnosis not present

## 2014-06-03 DIAGNOSIS — I482 Chronic atrial fibrillation: Secondary | ICD-10-CM | POA: Diagnosis not present

## 2014-06-03 DIAGNOSIS — I2510 Atherosclerotic heart disease of native coronary artery without angina pectoris: Secondary | ICD-10-CM | POA: Diagnosis not present

## 2014-06-07 DIAGNOSIS — I739 Peripheral vascular disease, unspecified: Secondary | ICD-10-CM | POA: Diagnosis not present

## 2014-06-07 DIAGNOSIS — Z89511 Acquired absence of right leg below knee: Secondary | ICD-10-CM | POA: Diagnosis not present

## 2014-06-07 DIAGNOSIS — I482 Chronic atrial fibrillation: Secondary | ICD-10-CM | POA: Diagnosis not present

## 2014-06-07 DIAGNOSIS — I509 Heart failure, unspecified: Secondary | ICD-10-CM | POA: Diagnosis not present

## 2014-06-10 DIAGNOSIS — N390 Urinary tract infection, site not specified: Secondary | ICD-10-CM | POA: Diagnosis not present

## 2014-06-10 DIAGNOSIS — R319 Hematuria, unspecified: Secondary | ICD-10-CM | POA: Diagnosis not present

## 2014-06-15 DIAGNOSIS — N390 Urinary tract infection, site not specified: Secondary | ICD-10-CM | POA: Diagnosis not present

## 2014-06-15 DIAGNOSIS — L97903 Non-pressure chronic ulcer of unspecified part of unspecified lower leg with necrosis of muscle: Secondary | ICD-10-CM | POA: Diagnosis not present

## 2014-06-15 DIAGNOSIS — R319 Hematuria, unspecified: Secondary | ICD-10-CM | POA: Diagnosis not present

## 2014-06-15 DIAGNOSIS — Z89511 Acquired absence of right leg below knee: Secondary | ICD-10-CM | POA: Diagnosis not present

## 2014-06-16 DIAGNOSIS — R238 Other skin changes: Secondary | ICD-10-CM | POA: Diagnosis not present

## 2014-06-16 DIAGNOSIS — I739 Peripheral vascular disease, unspecified: Secondary | ICD-10-CM | POA: Diagnosis not present

## 2014-06-16 DIAGNOSIS — Z7901 Long term (current) use of anticoagulants: Secondary | ICD-10-CM | POA: Diagnosis not present

## 2014-06-16 DIAGNOSIS — Z89511 Acquired absence of right leg below knee: Secondary | ICD-10-CM | POA: Diagnosis not present

## 2014-06-17 DIAGNOSIS — Z7901 Long term (current) use of anticoagulants: Secondary | ICD-10-CM | POA: Diagnosis not present

## 2014-06-17 DIAGNOSIS — Z89511 Acquired absence of right leg below knee: Secondary | ICD-10-CM | POA: Diagnosis not present

## 2014-06-17 DIAGNOSIS — I482 Chronic atrial fibrillation: Secondary | ICD-10-CM | POA: Diagnosis not present

## 2014-06-17 DIAGNOSIS — N390 Urinary tract infection, site not specified: Secondary | ICD-10-CM | POA: Diagnosis not present

## 2014-06-20 DIAGNOSIS — I482 Chronic atrial fibrillation: Secondary | ICD-10-CM | POA: Diagnosis not present

## 2014-06-20 DIAGNOSIS — Z89511 Acquired absence of right leg below knee: Secondary | ICD-10-CM | POA: Diagnosis not present

## 2014-06-20 DIAGNOSIS — N390 Urinary tract infection, site not specified: Secondary | ICD-10-CM | POA: Diagnosis not present

## 2014-06-20 DIAGNOSIS — I509 Heart failure, unspecified: Secondary | ICD-10-CM | POA: Diagnosis not present

## 2014-06-23 DIAGNOSIS — I482 Chronic atrial fibrillation: Secondary | ICD-10-CM | POA: Diagnosis not present

## 2014-06-23 DIAGNOSIS — I739 Peripheral vascular disease, unspecified: Secondary | ICD-10-CM | POA: Diagnosis not present

## 2014-06-23 DIAGNOSIS — Z89511 Acquired absence of right leg below knee: Secondary | ICD-10-CM | POA: Diagnosis not present

## 2014-06-23 DIAGNOSIS — Z7901 Long term (current) use of anticoagulants: Secondary | ICD-10-CM | POA: Diagnosis not present

## 2014-06-24 DIAGNOSIS — I739 Peripheral vascular disease, unspecified: Secondary | ICD-10-CM | POA: Diagnosis not present

## 2014-06-24 DIAGNOSIS — I482 Chronic atrial fibrillation: Secondary | ICD-10-CM | POA: Diagnosis not present

## 2014-06-24 DIAGNOSIS — Z89511 Acquired absence of right leg below knee: Secondary | ICD-10-CM | POA: Diagnosis not present

## 2014-06-24 DIAGNOSIS — F0390 Unspecified dementia without behavioral disturbance: Secondary | ICD-10-CM | POA: Diagnosis not present

## 2014-06-24 DIAGNOSIS — I509 Heart failure, unspecified: Secondary | ICD-10-CM | POA: Diagnosis not present

## 2014-06-28 DIAGNOSIS — E0859 Diabetes mellitus due to underlying condition with other circulatory complications: Secondary | ICD-10-CM | POA: Diagnosis not present

## 2014-06-28 DIAGNOSIS — Z89511 Acquired absence of right leg below knee: Secondary | ICD-10-CM | POA: Diagnosis not present

## 2014-07-04 DIAGNOSIS — Z7901 Long term (current) use of anticoagulants: Secondary | ICD-10-CM | POA: Diagnosis not present

## 2014-07-04 DIAGNOSIS — Z89511 Acquired absence of right leg below knee: Secondary | ICD-10-CM | POA: Diagnosis not present

## 2014-07-04 DIAGNOSIS — I739 Peripheral vascular disease, unspecified: Secondary | ICD-10-CM | POA: Diagnosis not present

## 2014-07-04 DIAGNOSIS — I482 Chronic atrial fibrillation: Secondary | ICD-10-CM | POA: Diagnosis not present

## 2014-07-07 DIAGNOSIS — Z7901 Long term (current) use of anticoagulants: Secondary | ICD-10-CM | POA: Diagnosis not present

## 2014-07-07 DIAGNOSIS — Z89511 Acquired absence of right leg below knee: Secondary | ICD-10-CM | POA: Diagnosis not present

## 2014-07-07 DIAGNOSIS — I739 Peripheral vascular disease, unspecified: Secondary | ICD-10-CM | POA: Diagnosis not present

## 2014-07-07 DIAGNOSIS — I482 Chronic atrial fibrillation: Secondary | ICD-10-CM | POA: Diagnosis not present

## 2014-07-12 DIAGNOSIS — I482 Chronic atrial fibrillation: Secondary | ICD-10-CM | POA: Diagnosis not present

## 2014-07-12 DIAGNOSIS — Z7901 Long term (current) use of anticoagulants: Secondary | ICD-10-CM | POA: Diagnosis not present

## 2014-07-12 DIAGNOSIS — Z89511 Acquired absence of right leg below knee: Secondary | ICD-10-CM | POA: Diagnosis not present

## 2014-07-12 DIAGNOSIS — R413 Other amnesia: Secondary | ICD-10-CM | POA: Diagnosis not present

## 2014-07-15 DIAGNOSIS — I4891 Unspecified atrial fibrillation: Secondary | ICD-10-CM | POA: Diagnosis not present

## 2014-07-15 DIAGNOSIS — I482 Chronic atrial fibrillation: Secondary | ICD-10-CM | POA: Diagnosis not present

## 2014-07-15 DIAGNOSIS — Z7901 Long term (current) use of anticoagulants: Secondary | ICD-10-CM | POA: Diagnosis not present

## 2014-07-15 DIAGNOSIS — E0859 Diabetes mellitus due to underlying condition with other circulatory complications: Secondary | ICD-10-CM | POA: Diagnosis not present

## 2014-07-15 DIAGNOSIS — Z89511 Acquired absence of right leg below knee: Secondary | ICD-10-CM | POA: Diagnosis not present

## 2014-07-19 ENCOUNTER — Encounter: Payer: Self-pay | Admitting: *Deleted

## 2014-07-19 DIAGNOSIS — Z89511 Acquired absence of right leg below knee: Secondary | ICD-10-CM | POA: Diagnosis not present

## 2014-07-19 DIAGNOSIS — T8743 Infection of amputation stump, right lower extremity: Secondary | ICD-10-CM | POA: Diagnosis not present

## 2014-07-19 DIAGNOSIS — Z7901 Long term (current) use of anticoagulants: Secondary | ICD-10-CM | POA: Diagnosis not present

## 2014-07-19 DIAGNOSIS — I482 Chronic atrial fibrillation: Secondary | ICD-10-CM | POA: Diagnosis not present

## 2014-07-19 DIAGNOSIS — I4891 Unspecified atrial fibrillation: Secondary | ICD-10-CM | POA: Diagnosis not present

## 2014-07-19 DIAGNOSIS — E0859 Diabetes mellitus due to underlying condition with other circulatory complications: Secondary | ICD-10-CM | POA: Diagnosis not present

## 2014-07-21 DIAGNOSIS — I4891 Unspecified atrial fibrillation: Secondary | ICD-10-CM | POA: Diagnosis not present

## 2014-07-21 DIAGNOSIS — E0859 Diabetes mellitus due to underlying condition with other circulatory complications: Secondary | ICD-10-CM | POA: Diagnosis not present

## 2014-07-21 DIAGNOSIS — Z89511 Acquired absence of right leg below knee: Secondary | ICD-10-CM | POA: Diagnosis not present

## 2014-07-22 DIAGNOSIS — I482 Chronic atrial fibrillation: Secondary | ICD-10-CM | POA: Diagnosis not present

## 2014-07-22 DIAGNOSIS — Z7901 Long term (current) use of anticoagulants: Secondary | ICD-10-CM | POA: Diagnosis not present

## 2014-07-22 DIAGNOSIS — I509 Heart failure, unspecified: Secondary | ICD-10-CM | POA: Diagnosis not present

## 2014-07-22 DIAGNOSIS — I739 Peripheral vascular disease, unspecified: Secondary | ICD-10-CM | POA: Diagnosis not present

## 2014-07-22 DIAGNOSIS — F0390 Unspecified dementia without behavioral disturbance: Secondary | ICD-10-CM | POA: Diagnosis not present

## 2014-07-22 DIAGNOSIS — Z89511 Acquired absence of right leg below knee: Secondary | ICD-10-CM | POA: Diagnosis not present

## 2014-07-22 DIAGNOSIS — T8743 Infection of amputation stump, right lower extremity: Secondary | ICD-10-CM | POA: Diagnosis not present

## 2014-07-22 DIAGNOSIS — I4891 Unspecified atrial fibrillation: Secondary | ICD-10-CM | POA: Diagnosis not present

## 2014-07-22 DIAGNOSIS — E0859 Diabetes mellitus due to underlying condition with other circulatory complications: Secondary | ICD-10-CM | POA: Diagnosis not present

## 2014-07-26 DIAGNOSIS — Z7901 Long term (current) use of anticoagulants: Secondary | ICD-10-CM | POA: Diagnosis not present

## 2014-07-26 DIAGNOSIS — T8743 Infection of amputation stump, right lower extremity: Secondary | ICD-10-CM | POA: Diagnosis not present

## 2014-07-26 DIAGNOSIS — I482 Chronic atrial fibrillation: Secondary | ICD-10-CM | POA: Diagnosis not present

## 2014-07-26 DIAGNOSIS — I4891 Unspecified atrial fibrillation: Secondary | ICD-10-CM | POA: Diagnosis not present

## 2014-07-26 DIAGNOSIS — Z89511 Acquired absence of right leg below knee: Secondary | ICD-10-CM | POA: Diagnosis not present

## 2014-07-26 DIAGNOSIS — E0859 Diabetes mellitus due to underlying condition with other circulatory complications: Secondary | ICD-10-CM | POA: Diagnosis not present

## 2014-07-27 DIAGNOSIS — T8743 Infection of amputation stump, right lower extremity: Secondary | ICD-10-CM | POA: Diagnosis not present

## 2014-07-27 DIAGNOSIS — I739 Peripheral vascular disease, unspecified: Secondary | ICD-10-CM | POA: Diagnosis not present

## 2014-07-27 DIAGNOSIS — Z89511 Acquired absence of right leg below knee: Secondary | ICD-10-CM | POA: Diagnosis not present

## 2014-07-27 DIAGNOSIS — R238 Other skin changes: Secondary | ICD-10-CM | POA: Diagnosis not present

## 2014-07-29 DIAGNOSIS — S81801A Unspecified open wound, right lower leg, initial encounter: Secondary | ICD-10-CM | POA: Diagnosis not present

## 2014-07-29 DIAGNOSIS — E0859 Diabetes mellitus due to underlying condition with other circulatory complications: Secondary | ICD-10-CM | POA: Diagnosis not present

## 2014-07-29 DIAGNOSIS — Z89511 Acquired absence of right leg below knee: Secondary | ICD-10-CM | POA: Diagnosis not present

## 2014-07-29 DIAGNOSIS — I4891 Unspecified atrial fibrillation: Secondary | ICD-10-CM | POA: Diagnosis not present

## 2014-07-30 DIAGNOSIS — I10 Essential (primary) hypertension: Secondary | ICD-10-CM | POA: Diagnosis not present

## 2014-07-30 DIAGNOSIS — Z79899 Other long term (current) drug therapy: Secondary | ICD-10-CM | POA: Diagnosis not present

## 2014-07-30 DIAGNOSIS — D649 Anemia, unspecified: Secondary | ICD-10-CM | POA: Diagnosis not present

## 2014-08-01 DIAGNOSIS — I4891 Unspecified atrial fibrillation: Secondary | ICD-10-CM | POA: Diagnosis not present

## 2014-08-01 DIAGNOSIS — Z7901 Long term (current) use of anticoagulants: Secondary | ICD-10-CM | POA: Diagnosis not present

## 2014-08-01 DIAGNOSIS — N183 Chronic kidney disease, stage 3 (moderate): Secondary | ICD-10-CM | POA: Diagnosis not present

## 2014-08-01 DIAGNOSIS — D539 Nutritional anemia, unspecified: Secondary | ICD-10-CM | POA: Diagnosis not present

## 2014-08-01 DIAGNOSIS — I482 Chronic atrial fibrillation: Secondary | ICD-10-CM | POA: Diagnosis not present

## 2014-08-01 DIAGNOSIS — Z89511 Acquired absence of right leg below knee: Secondary | ICD-10-CM | POA: Diagnosis not present

## 2014-08-01 DIAGNOSIS — T8743 Infection of amputation stump, right lower extremity: Secondary | ICD-10-CM | POA: Diagnosis not present

## 2014-08-01 DIAGNOSIS — E0859 Diabetes mellitus due to underlying condition with other circulatory complications: Secondary | ICD-10-CM | POA: Diagnosis not present

## 2014-08-02 DIAGNOSIS — Z7901 Long term (current) use of anticoagulants: Secondary | ICD-10-CM | POA: Diagnosis not present

## 2014-08-02 DIAGNOSIS — Z89511 Acquired absence of right leg below knee: Secondary | ICD-10-CM | POA: Diagnosis not present

## 2014-08-02 DIAGNOSIS — D539 Nutritional anemia, unspecified: Secondary | ICD-10-CM | POA: Diagnosis not present

## 2014-08-02 DIAGNOSIS — T8743 Infection of amputation stump, right lower extremity: Secondary | ICD-10-CM | POA: Diagnosis not present

## 2014-08-02 DIAGNOSIS — I482 Chronic atrial fibrillation: Secondary | ICD-10-CM | POA: Diagnosis not present

## 2014-08-02 DIAGNOSIS — N183 Chronic kidney disease, stage 3 (moderate): Secondary | ICD-10-CM | POA: Diagnosis not present

## 2014-08-03 DIAGNOSIS — I4891 Unspecified atrial fibrillation: Secondary | ICD-10-CM | POA: Diagnosis not present

## 2014-08-03 DIAGNOSIS — D649 Anemia, unspecified: Secondary | ICD-10-CM | POA: Diagnosis not present

## 2014-08-03 DIAGNOSIS — F0390 Unspecified dementia without behavioral disturbance: Secondary | ICD-10-CM | POA: Diagnosis not present

## 2014-08-03 DIAGNOSIS — Z89511 Acquired absence of right leg below knee: Secondary | ICD-10-CM | POA: Diagnosis not present

## 2014-08-03 DIAGNOSIS — Z7901 Long term (current) use of anticoagulants: Secondary | ICD-10-CM | POA: Diagnosis not present

## 2014-08-03 DIAGNOSIS — E0859 Diabetes mellitus due to underlying condition with other circulatory complications: Secondary | ICD-10-CM | POA: Diagnosis not present

## 2014-08-03 DIAGNOSIS — I482 Chronic atrial fibrillation: Secondary | ICD-10-CM | POA: Diagnosis not present

## 2014-08-03 DIAGNOSIS — T8743 Infection of amputation stump, right lower extremity: Secondary | ICD-10-CM | POA: Diagnosis not present

## 2014-08-03 DIAGNOSIS — R6889 Other general symptoms and signs: Secondary | ICD-10-CM | POA: Diagnosis not present

## 2014-08-05 DIAGNOSIS — I4891 Unspecified atrial fibrillation: Secondary | ICD-10-CM | POA: Diagnosis not present

## 2014-08-05 DIAGNOSIS — E0859 Diabetes mellitus due to underlying condition with other circulatory complications: Secondary | ICD-10-CM | POA: Diagnosis not present

## 2014-08-05 DIAGNOSIS — Z89511 Acquired absence of right leg below knee: Secondary | ICD-10-CM | POA: Diagnosis not present

## 2014-08-08 DIAGNOSIS — T8743 Infection of amputation stump, right lower extremity: Secondary | ICD-10-CM | POA: Diagnosis not present

## 2014-08-08 DIAGNOSIS — I482 Chronic atrial fibrillation: Secondary | ICD-10-CM | POA: Diagnosis not present

## 2014-08-08 DIAGNOSIS — Z89511 Acquired absence of right leg below knee: Secondary | ICD-10-CM | POA: Diagnosis not present

## 2014-08-08 DIAGNOSIS — E0859 Diabetes mellitus due to underlying condition with other circulatory complications: Secondary | ICD-10-CM | POA: Diagnosis not present

## 2014-08-08 DIAGNOSIS — F0390 Unspecified dementia without behavioral disturbance: Secondary | ICD-10-CM | POA: Diagnosis not present

## 2014-08-08 DIAGNOSIS — Z7901 Long term (current) use of anticoagulants: Secondary | ICD-10-CM | POA: Diagnosis not present

## 2014-08-08 DIAGNOSIS — I4891 Unspecified atrial fibrillation: Secondary | ICD-10-CM | POA: Diagnosis not present

## 2014-08-09 DIAGNOSIS — I482 Chronic atrial fibrillation: Secondary | ICD-10-CM | POA: Diagnosis not present

## 2014-08-09 DIAGNOSIS — E0859 Diabetes mellitus due to underlying condition with other circulatory complications: Secondary | ICD-10-CM | POA: Diagnosis not present

## 2014-08-09 DIAGNOSIS — Z89511 Acquired absence of right leg below knee: Secondary | ICD-10-CM | POA: Diagnosis not present

## 2014-08-09 DIAGNOSIS — Z7901 Long term (current) use of anticoagulants: Secondary | ICD-10-CM | POA: Diagnosis not present

## 2014-08-09 DIAGNOSIS — R4182 Altered mental status, unspecified: Secondary | ICD-10-CM | POA: Diagnosis not present

## 2014-08-09 DIAGNOSIS — I4891 Unspecified atrial fibrillation: Secondary | ICD-10-CM | POA: Diagnosis not present

## 2014-08-09 DIAGNOSIS — T8743 Infection of amputation stump, right lower extremity: Secondary | ICD-10-CM | POA: Diagnosis not present

## 2014-08-10 ENCOUNTER — Encounter: Payer: Self-pay | Admitting: *Deleted

## 2014-08-10 DIAGNOSIS — T8743 Infection of amputation stump, right lower extremity: Secondary | ICD-10-CM | POA: Diagnosis not present

## 2014-08-10 DIAGNOSIS — R609 Edema, unspecified: Secondary | ICD-10-CM | POA: Diagnosis not present

## 2014-08-10 DIAGNOSIS — I482 Chronic atrial fibrillation: Secondary | ICD-10-CM | POA: Diagnosis not present

## 2014-08-10 DIAGNOSIS — R4182 Altered mental status, unspecified: Secondary | ICD-10-CM | POA: Diagnosis not present

## 2014-08-11 DIAGNOSIS — E0859 Diabetes mellitus due to underlying condition with other circulatory complications: Secondary | ICD-10-CM | POA: Diagnosis not present

## 2014-08-11 DIAGNOSIS — R319 Hematuria, unspecified: Secondary | ICD-10-CM | POA: Diagnosis not present

## 2014-08-11 DIAGNOSIS — Z89511 Acquired absence of right leg below knee: Secondary | ICD-10-CM | POA: Diagnosis not present

## 2014-08-11 DIAGNOSIS — D649 Anemia, unspecified: Secondary | ICD-10-CM | POA: Diagnosis not present

## 2014-08-11 DIAGNOSIS — I4891 Unspecified atrial fibrillation: Secondary | ICD-10-CM | POA: Diagnosis not present

## 2014-08-11 DIAGNOSIS — Z79899 Other long term (current) drug therapy: Secondary | ICD-10-CM | POA: Diagnosis not present

## 2014-08-11 DIAGNOSIS — E785 Hyperlipidemia, unspecified: Secondary | ICD-10-CM | POA: Diagnosis not present

## 2014-08-11 DIAGNOSIS — N390 Urinary tract infection, site not specified: Secondary | ICD-10-CM | POA: Diagnosis not present

## 2014-08-12 DIAGNOSIS — I7025 Atherosclerosis of native arteries of other extremities with ulceration: Secondary | ICD-10-CM | POA: Diagnosis not present

## 2014-08-12 DIAGNOSIS — Z7901 Long term (current) use of anticoagulants: Secondary | ICD-10-CM | POA: Diagnosis not present

## 2014-08-12 DIAGNOSIS — D689 Coagulation defect, unspecified: Secondary | ICD-10-CM | POA: Diagnosis not present

## 2014-08-12 DIAGNOSIS — I4891 Unspecified atrial fibrillation: Secondary | ICD-10-CM | POA: Diagnosis not present

## 2014-08-12 DIAGNOSIS — E0859 Diabetes mellitus due to underlying condition with other circulatory complications: Secondary | ICD-10-CM | POA: Diagnosis not present

## 2014-08-12 DIAGNOSIS — Z89511 Acquired absence of right leg below knee: Secondary | ICD-10-CM | POA: Diagnosis not present

## 2014-08-16 DIAGNOSIS — I482 Chronic atrial fibrillation: Secondary | ICD-10-CM | POA: Diagnosis not present

## 2014-08-16 DIAGNOSIS — R4182 Altered mental status, unspecified: Secondary | ICD-10-CM | POA: Diagnosis not present

## 2014-08-16 DIAGNOSIS — Z89511 Acquired absence of right leg below knee: Secondary | ICD-10-CM | POA: Diagnosis not present

## 2014-08-16 DIAGNOSIS — Z7901 Long term (current) use of anticoagulants: Secondary | ICD-10-CM | POA: Diagnosis not present

## 2014-08-18 DIAGNOSIS — E785 Hyperlipidemia, unspecified: Secondary | ICD-10-CM | POA: Diagnosis not present

## 2014-08-18 DIAGNOSIS — D649 Anemia, unspecified: Secondary | ICD-10-CM | POA: Diagnosis not present

## 2014-08-18 DIAGNOSIS — E039 Hypothyroidism, unspecified: Secondary | ICD-10-CM | POA: Diagnosis not present

## 2014-08-18 DIAGNOSIS — Z79899 Other long term (current) drug therapy: Secondary | ICD-10-CM | POA: Diagnosis not present

## 2014-08-18 DIAGNOSIS — D518 Other vitamin B12 deficiency anemias: Secondary | ICD-10-CM | POA: Diagnosis not present

## 2014-08-18 DIAGNOSIS — E119 Type 2 diabetes mellitus without complications: Secondary | ICD-10-CM | POA: Diagnosis not present

## 2014-08-19 DIAGNOSIS — Z89511 Acquired absence of right leg below knee: Secondary | ICD-10-CM | POA: Diagnosis not present

## 2014-08-19 DIAGNOSIS — Z7901 Long term (current) use of anticoagulants: Secondary | ICD-10-CM | POA: Diagnosis not present

## 2014-08-19 DIAGNOSIS — I482 Chronic atrial fibrillation: Secondary | ICD-10-CM | POA: Diagnosis not present

## 2014-08-19 DIAGNOSIS — R791 Abnormal coagulation profile: Secondary | ICD-10-CM | POA: Diagnosis not present

## 2014-08-24 DIAGNOSIS — I482 Chronic atrial fibrillation: Secondary | ICD-10-CM | POA: Diagnosis not present

## 2014-08-24 DIAGNOSIS — Z89511 Acquired absence of right leg below knee: Secondary | ICD-10-CM | POA: Diagnosis not present

## 2014-08-24 DIAGNOSIS — Z7901 Long term (current) use of anticoagulants: Secondary | ICD-10-CM | POA: Diagnosis not present

## 2014-09-05 DIAGNOSIS — I7025 Atherosclerosis of native arteries of other extremities with ulceration: Secondary | ICD-10-CM | POA: Diagnosis not present

## 2014-09-05 DIAGNOSIS — I70211 Atherosclerosis of native arteries of extremities with intermittent claudication, right leg: Secondary | ICD-10-CM | POA: Diagnosis not present

## 2014-09-05 DIAGNOSIS — Z89511 Acquired absence of right leg below knee: Secondary | ICD-10-CM | POA: Diagnosis not present

## 2014-09-05 DIAGNOSIS — I70261 Atherosclerosis of native arteries of extremities with gangrene, right leg: Secondary | ICD-10-CM | POA: Diagnosis not present

## 2014-09-13 DIAGNOSIS — D689 Coagulation defect, unspecified: Secondary | ICD-10-CM | POA: Diagnosis not present

## 2014-09-13 DIAGNOSIS — R791 Abnormal coagulation profile: Secondary | ICD-10-CM | POA: Diagnosis not present

## 2014-09-13 DIAGNOSIS — R040 Epistaxis: Secondary | ICD-10-CM | POA: Diagnosis not present

## 2014-09-13 DIAGNOSIS — Z7901 Long term (current) use of anticoagulants: Secondary | ICD-10-CM | POA: Diagnosis not present

## 2014-09-13 DIAGNOSIS — I482 Chronic atrial fibrillation: Secondary | ICD-10-CM | POA: Diagnosis not present

## 2014-09-13 DIAGNOSIS — D649 Anemia, unspecified: Secondary | ICD-10-CM | POA: Diagnosis not present

## 2014-09-16 DIAGNOSIS — E1140 Type 2 diabetes mellitus with diabetic neuropathy, unspecified: Secondary | ICD-10-CM | POA: Diagnosis not present

## 2014-09-16 DIAGNOSIS — Z89511 Acquired absence of right leg below knee: Secondary | ICD-10-CM | POA: Diagnosis not present

## 2014-09-16 DIAGNOSIS — I509 Heart failure, unspecified: Secondary | ICD-10-CM | POA: Diagnosis not present

## 2014-09-16 DIAGNOSIS — I639 Cerebral infarction, unspecified: Secondary | ICD-10-CM | POA: Diagnosis not present

## 2014-09-16 DIAGNOSIS — I739 Peripheral vascular disease, unspecified: Secondary | ICD-10-CM | POA: Diagnosis not present

## 2014-09-16 DIAGNOSIS — I10 Essential (primary) hypertension: Secondary | ICD-10-CM | POA: Diagnosis not present

## 2014-09-16 DIAGNOSIS — I482 Chronic atrial fibrillation: Secondary | ICD-10-CM | POA: Diagnosis not present

## 2014-09-16 DIAGNOSIS — F0391 Unspecified dementia with behavioral disturbance: Secondary | ICD-10-CM | POA: Diagnosis not present

## 2014-09-19 DIAGNOSIS — I4891 Unspecified atrial fibrillation: Secondary | ICD-10-CM | POA: Diagnosis not present

## 2014-09-19 DIAGNOSIS — Z89511 Acquired absence of right leg below knee: Secondary | ICD-10-CM | POA: Diagnosis not present

## 2014-09-19 DIAGNOSIS — E0859 Diabetes mellitus due to underlying condition with other circulatory complications: Secondary | ICD-10-CM | POA: Diagnosis not present

## 2014-09-22 DIAGNOSIS — I4891 Unspecified atrial fibrillation: Secondary | ICD-10-CM | POA: Diagnosis not present

## 2014-09-22 DIAGNOSIS — Z89511 Acquired absence of right leg below knee: Secondary | ICD-10-CM | POA: Diagnosis not present

## 2014-09-22 DIAGNOSIS — E0859 Diabetes mellitus due to underlying condition with other circulatory complications: Secondary | ICD-10-CM | POA: Diagnosis not present

## 2014-09-26 DIAGNOSIS — E0859 Diabetes mellitus due to underlying condition with other circulatory complications: Secondary | ICD-10-CM | POA: Diagnosis not present

## 2014-09-26 DIAGNOSIS — Z89511 Acquired absence of right leg below knee: Secondary | ICD-10-CM | POA: Diagnosis not present

## 2014-09-26 DIAGNOSIS — I4891 Unspecified atrial fibrillation: Secondary | ICD-10-CM | POA: Diagnosis not present

## 2014-09-28 DIAGNOSIS — I4891 Unspecified atrial fibrillation: Secondary | ICD-10-CM | POA: Diagnosis not present

## 2014-09-28 DIAGNOSIS — E0859 Diabetes mellitus due to underlying condition with other circulatory complications: Secondary | ICD-10-CM | POA: Diagnosis not present

## 2014-09-28 DIAGNOSIS — Z89511 Acquired absence of right leg below knee: Secondary | ICD-10-CM | POA: Diagnosis not present

## 2014-10-01 DIAGNOSIS — Z7901 Long term (current) use of anticoagulants: Secondary | ICD-10-CM | POA: Diagnosis not present

## 2014-10-04 DIAGNOSIS — E0859 Diabetes mellitus due to underlying condition with other circulatory complications: Secondary | ICD-10-CM | POA: Diagnosis not present

## 2014-10-04 DIAGNOSIS — Z89511 Acquired absence of right leg below knee: Secondary | ICD-10-CM | POA: Diagnosis not present

## 2014-10-04 DIAGNOSIS — I4891 Unspecified atrial fibrillation: Secondary | ICD-10-CM | POA: Diagnosis not present

## 2014-10-06 DIAGNOSIS — Z89511 Acquired absence of right leg below knee: Secondary | ICD-10-CM | POA: Diagnosis not present

## 2014-10-06 DIAGNOSIS — I4891 Unspecified atrial fibrillation: Secondary | ICD-10-CM | POA: Diagnosis not present

## 2014-10-06 DIAGNOSIS — E0859 Diabetes mellitus due to underlying condition with other circulatory complications: Secondary | ICD-10-CM | POA: Diagnosis not present

## 2014-10-07 DIAGNOSIS — I7025 Atherosclerosis of native arteries of other extremities with ulceration: Secondary | ICD-10-CM | POA: Diagnosis not present

## 2014-10-07 DIAGNOSIS — I70211 Atherosclerosis of native arteries of extremities with intermittent claudication, right leg: Secondary | ICD-10-CM | POA: Diagnosis not present

## 2014-10-07 DIAGNOSIS — I4891 Unspecified atrial fibrillation: Secondary | ICD-10-CM | POA: Diagnosis not present

## 2014-10-07 DIAGNOSIS — I70261 Atherosclerosis of native arteries of extremities with gangrene, right leg: Secondary | ICD-10-CM | POA: Diagnosis not present

## 2014-10-07 DIAGNOSIS — E0859 Diabetes mellitus due to underlying condition with other circulatory complications: Secondary | ICD-10-CM | POA: Diagnosis not present

## 2014-10-07 DIAGNOSIS — Z89511 Acquired absence of right leg below knee: Secondary | ICD-10-CM | POA: Diagnosis not present

## 2014-10-09 DIAGNOSIS — I4891 Unspecified atrial fibrillation: Secondary | ICD-10-CM | POA: Diagnosis not present

## 2014-10-09 DIAGNOSIS — Z89511 Acquired absence of right leg below knee: Secondary | ICD-10-CM | POA: Diagnosis not present

## 2014-10-09 DIAGNOSIS — E0859 Diabetes mellitus due to underlying condition with other circulatory complications: Secondary | ICD-10-CM | POA: Diagnosis not present

## 2014-10-11 DIAGNOSIS — I4891 Unspecified atrial fibrillation: Secondary | ICD-10-CM | POA: Diagnosis not present

## 2014-10-11 DIAGNOSIS — Z89511 Acquired absence of right leg below knee: Secondary | ICD-10-CM | POA: Diagnosis not present

## 2014-10-11 DIAGNOSIS — E0859 Diabetes mellitus due to underlying condition with other circulatory complications: Secondary | ICD-10-CM | POA: Diagnosis not present

## 2014-10-14 DIAGNOSIS — Z89511 Acquired absence of right leg below knee: Secondary | ICD-10-CM | POA: Diagnosis not present

## 2014-10-14 DIAGNOSIS — E0859 Diabetes mellitus due to underlying condition with other circulatory complications: Secondary | ICD-10-CM | POA: Diagnosis not present

## 2014-10-14 DIAGNOSIS — I4891 Unspecified atrial fibrillation: Secondary | ICD-10-CM | POA: Diagnosis not present

## 2014-10-18 DIAGNOSIS — Z89511 Acquired absence of right leg below knee: Secondary | ICD-10-CM | POA: Diagnosis not present

## 2014-10-18 DIAGNOSIS — I4891 Unspecified atrial fibrillation: Secondary | ICD-10-CM | POA: Diagnosis not present

## 2014-10-18 DIAGNOSIS — E0859 Diabetes mellitus due to underlying condition with other circulatory complications: Secondary | ICD-10-CM | POA: Diagnosis not present

## 2014-10-22 DIAGNOSIS — R0989 Other specified symptoms and signs involving the circulatory and respiratory systems: Secondary | ICD-10-CM | POA: Diagnosis not present

## 2014-10-22 DIAGNOSIS — R05 Cough: Secondary | ICD-10-CM | POA: Diagnosis not present

## 2014-10-25 DIAGNOSIS — Z89511 Acquired absence of right leg below knee: Secondary | ICD-10-CM | POA: Diagnosis not present

## 2014-10-25 DIAGNOSIS — E0859 Diabetes mellitus due to underlying condition with other circulatory complications: Secondary | ICD-10-CM | POA: Diagnosis not present

## 2014-10-25 DIAGNOSIS — I4891 Unspecified atrial fibrillation: Secondary | ICD-10-CM | POA: Diagnosis not present

## 2014-10-29 DIAGNOSIS — J209 Acute bronchitis, unspecified: Secondary | ICD-10-CM | POA: Diagnosis not present

## 2014-10-30 DIAGNOSIS — E039 Hypothyroidism, unspecified: Secondary | ICD-10-CM | POA: Diagnosis not present

## 2014-10-30 DIAGNOSIS — D689 Coagulation defect, unspecified: Secondary | ICD-10-CM | POA: Diagnosis not present

## 2014-10-30 DIAGNOSIS — D518 Other vitamin B12 deficiency anemias: Secondary | ICD-10-CM | POA: Diagnosis not present

## 2014-10-30 DIAGNOSIS — D649 Anemia, unspecified: Secondary | ICD-10-CM | POA: Diagnosis not present

## 2014-10-31 DIAGNOSIS — I4891 Unspecified atrial fibrillation: Secondary | ICD-10-CM | POA: Diagnosis not present

## 2014-10-31 DIAGNOSIS — Z89511 Acquired absence of right leg below knee: Secondary | ICD-10-CM | POA: Diagnosis not present

## 2014-10-31 DIAGNOSIS — E0859 Diabetes mellitus due to underlying condition with other circulatory complications: Secondary | ICD-10-CM | POA: Diagnosis not present

## 2014-11-04 DIAGNOSIS — I7025 Atherosclerosis of native arteries of other extremities with ulceration: Secondary | ICD-10-CM | POA: Diagnosis not present

## 2014-11-04 DIAGNOSIS — M7541 Impingement syndrome of right shoulder: Secondary | ICD-10-CM | POA: Diagnosis not present

## 2014-11-04 DIAGNOSIS — I70211 Atherosclerosis of native arteries of extremities with intermittent claudication, right leg: Secondary | ICD-10-CM | POA: Diagnosis not present

## 2014-11-04 DIAGNOSIS — E0859 Diabetes mellitus due to underlying condition with other circulatory complications: Secondary | ICD-10-CM | POA: Diagnosis not present

## 2014-11-04 DIAGNOSIS — I4891 Unspecified atrial fibrillation: Secondary | ICD-10-CM | POA: Diagnosis not present

## 2014-11-04 DIAGNOSIS — Z89511 Acquired absence of right leg below knee: Secondary | ICD-10-CM | POA: Diagnosis not present

## 2014-11-08 DIAGNOSIS — I4891 Unspecified atrial fibrillation: Secondary | ICD-10-CM | POA: Diagnosis not present

## 2014-11-08 DIAGNOSIS — E0859 Diabetes mellitus due to underlying condition with other circulatory complications: Secondary | ICD-10-CM | POA: Diagnosis not present

## 2014-11-08 DIAGNOSIS — Z89511 Acquired absence of right leg below knee: Secondary | ICD-10-CM | POA: Diagnosis not present

## 2014-11-10 DIAGNOSIS — I4891 Unspecified atrial fibrillation: Secondary | ICD-10-CM | POA: Diagnosis not present

## 2014-11-10 DIAGNOSIS — I10 Essential (primary) hypertension: Secondary | ICD-10-CM | POA: Diagnosis not present

## 2014-11-10 DIAGNOSIS — E0859 Diabetes mellitus due to underlying condition with other circulatory complications: Secondary | ICD-10-CM | POA: Diagnosis not present

## 2014-11-10 DIAGNOSIS — E1140 Type 2 diabetes mellitus with diabetic neuropathy, unspecified: Secondary | ICD-10-CM | POA: Diagnosis not present

## 2014-11-10 DIAGNOSIS — Z89511 Acquired absence of right leg below knee: Secondary | ICD-10-CM | POA: Diagnosis not present

## 2014-11-10 DIAGNOSIS — I482 Chronic atrial fibrillation: Secondary | ICD-10-CM | POA: Diagnosis not present

## 2014-11-10 DIAGNOSIS — F0391 Unspecified dementia with behavioral disturbance: Secondary | ICD-10-CM | POA: Diagnosis not present

## 2014-11-10 DIAGNOSIS — I639 Cerebral infarction, unspecified: Secondary | ICD-10-CM | POA: Diagnosis not present

## 2014-11-10 DIAGNOSIS — I5022 Chronic systolic (congestive) heart failure: Secondary | ICD-10-CM | POA: Diagnosis not present

## 2014-11-14 DIAGNOSIS — I482 Chronic atrial fibrillation: Secondary | ICD-10-CM | POA: Diagnosis not present

## 2014-11-14 DIAGNOSIS — I70261 Atherosclerosis of native arteries of extremities with gangrene, right leg: Secondary | ICD-10-CM | POA: Diagnosis not present

## 2014-11-14 DIAGNOSIS — E0859 Diabetes mellitus due to underlying condition with other circulatory complications: Secondary | ICD-10-CM | POA: Diagnosis not present

## 2014-11-14 DIAGNOSIS — Z89511 Acquired absence of right leg below knee: Secondary | ICD-10-CM | POA: Diagnosis not present

## 2014-11-14 DIAGNOSIS — I70211 Atherosclerosis of native arteries of extremities with intermittent claudication, right leg: Secondary | ICD-10-CM | POA: Diagnosis not present

## 2014-11-14 DIAGNOSIS — I7025 Atherosclerosis of native arteries of other extremities with ulceration: Secondary | ICD-10-CM | POA: Diagnosis not present

## 2014-11-17 DIAGNOSIS — I482 Chronic atrial fibrillation: Secondary | ICD-10-CM | POA: Diagnosis not present

## 2014-11-17 DIAGNOSIS — Z89511 Acquired absence of right leg below knee: Secondary | ICD-10-CM | POA: Diagnosis not present

## 2014-11-17 DIAGNOSIS — E0859 Diabetes mellitus due to underlying condition with other circulatory complications: Secondary | ICD-10-CM | POA: Diagnosis not present

## 2014-11-19 DIAGNOSIS — E0859 Diabetes mellitus due to underlying condition with other circulatory complications: Secondary | ICD-10-CM | POA: Diagnosis not present

## 2014-11-19 DIAGNOSIS — Z89511 Acquired absence of right leg below knee: Secondary | ICD-10-CM | POA: Diagnosis not present

## 2014-11-19 DIAGNOSIS — I482 Chronic atrial fibrillation: Secondary | ICD-10-CM | POA: Diagnosis not present

## 2014-11-21 DIAGNOSIS — E0859 Diabetes mellitus due to underlying condition with other circulatory complications: Secondary | ICD-10-CM | POA: Diagnosis not present

## 2014-11-21 DIAGNOSIS — Z89511 Acquired absence of right leg below knee: Secondary | ICD-10-CM | POA: Diagnosis not present

## 2014-11-21 DIAGNOSIS — I482 Chronic atrial fibrillation: Secondary | ICD-10-CM | POA: Diagnosis not present

## 2014-11-22 DIAGNOSIS — Z79899 Other long term (current) drug therapy: Secondary | ICD-10-CM | POA: Diagnosis not present

## 2014-11-22 DIAGNOSIS — E119 Type 2 diabetes mellitus without complications: Secondary | ICD-10-CM | POA: Diagnosis not present

## 2014-11-22 DIAGNOSIS — E785 Hyperlipidemia, unspecified: Secondary | ICD-10-CM | POA: Diagnosis not present

## 2014-11-22 DIAGNOSIS — D649 Anemia, unspecified: Secondary | ICD-10-CM | POA: Diagnosis not present

## 2014-11-22 DIAGNOSIS — I10 Essential (primary) hypertension: Secondary | ICD-10-CM | POA: Diagnosis not present

## 2014-11-24 DIAGNOSIS — Z89511 Acquired absence of right leg below knee: Secondary | ICD-10-CM | POA: Diagnosis not present

## 2014-11-24 DIAGNOSIS — E0859 Diabetes mellitus due to underlying condition with other circulatory complications: Secondary | ICD-10-CM | POA: Diagnosis not present

## 2014-11-24 DIAGNOSIS — I482 Chronic atrial fibrillation: Secondary | ICD-10-CM | POA: Diagnosis not present

## 2014-11-26 DIAGNOSIS — Z89511 Acquired absence of right leg below knee: Secondary | ICD-10-CM | POA: Diagnosis not present

## 2014-11-26 DIAGNOSIS — I482 Chronic atrial fibrillation: Secondary | ICD-10-CM | POA: Diagnosis not present

## 2014-11-26 DIAGNOSIS — E0859 Diabetes mellitus due to underlying condition with other circulatory complications: Secondary | ICD-10-CM | POA: Diagnosis not present

## 2014-11-28 DIAGNOSIS — Z89511 Acquired absence of right leg below knee: Secondary | ICD-10-CM | POA: Diagnosis not present

## 2014-11-28 DIAGNOSIS — E0859 Diabetes mellitus due to underlying condition with other circulatory complications: Secondary | ICD-10-CM | POA: Diagnosis not present

## 2014-11-28 DIAGNOSIS — I482 Chronic atrial fibrillation: Secondary | ICD-10-CM | POA: Diagnosis not present

## 2014-11-29 ENCOUNTER — Encounter: Payer: Self-pay | Admitting: Internal Medicine

## 2014-11-29 ENCOUNTER — Ambulatory Visit (INDEPENDENT_AMBULATORY_CARE_PROVIDER_SITE_OTHER): Payer: Medicare Other | Admitting: Internal Medicine

## 2014-11-29 VITALS — BP 122/68 | HR 82 | Ht 62.0 in

## 2014-11-29 DIAGNOSIS — I442 Atrioventricular block, complete: Secondary | ICD-10-CM | POA: Diagnosis not present

## 2014-11-29 DIAGNOSIS — I482 Chronic atrial fibrillation, unspecified: Secondary | ICD-10-CM

## 2014-11-29 DIAGNOSIS — I639 Cerebral infarction, unspecified: Secondary | ICD-10-CM | POA: Diagnosis not present

## 2014-11-29 DIAGNOSIS — I4821 Permanent atrial fibrillation: Secondary | ICD-10-CM

## 2014-11-29 DIAGNOSIS — Z45018 Encounter for adjustment and management of other part of cardiac pacemaker: Secondary | ICD-10-CM

## 2014-11-29 LAB — CUP PACEART INCLINIC DEVICE CHECK
Battery Voltage: 2.75 V
Date Time Interrogation Session: 20160816165224
Lead Channel Impedance Value: 622 Ohm
Lead Channel Pacing Threshold Amplitude: 1 V
Lead Channel Pacing Threshold Pulse Width: 0.4 ms
Lead Channel Pacing Threshold Pulse Width: 0.6 ms
Lead Channel Setting Pacing Amplitude: 2 V
Lead Channel Setting Pacing Amplitude: 2.5 V
MDC IDC MSMT LEADCHNL LV PACING THRESHOLD AMPLITUDE: 1.25 V
MDC IDC MSMT LEADCHNL RV IMPEDANCE VALUE: 510 Ohm
MDC IDC SET LEADCHNL LV PACING PULSEWIDTH: 0.6 ms
MDC IDC SET LEADCHNL RV PACING PULSEWIDTH: 0.4 ms
MDC IDC SET LEADCHNL RV SENSING SENSITIVITY: 4 mV
MDC IDC STAT BRADY RV PERCENT PACED: 99 % — AB
Pulse Gen Model: 5586
Pulse Gen Serial Number: 1989073

## 2014-11-29 NOTE — Patient Instructions (Addendum)
Medication Instructions:  Your physician recommends that you continue on your current medications as directed. Please refer to the Current Medication list given to you today.  Labwork: None ordered  Testing/Procedures: None ordered  Follow-Up: Continue the [employer] checks every 3 months.  Your physician recommends that you schedule a follow-up appointment in: 12 months with Dr.Klein  Any Other Special Instructions Will Be Listed Below (If Applicable). Thank you for choosing [facility]!!

## 2014-11-29 NOTE — Progress Notes (Signed)
Patient Care Team: Jani Gravel, MD as PCP - General Newt Minion, MD as Attending Physician (Orthopedic Surgery) Larey Dresser, MD (Cardiology)   HPI  Judith Zuniga is a 81 y.o. female following CRT implantation with AV junction ablation  for uncontrolled atrial arrhythmias. This was done in January.2009.    Echo cardiogram 2013 demonstrated improvement in left ventricular function; she had severe bilateral AV valvular regurgitation.   The patient denies chest pain, shortness of breath, nocturnal dyspnea, orthopnea or peripheral edema. There have been no palpitations, or syncope.SHe does however have orthostatic lightheadedness which has been particularly problematic in the morning. She had an amptation in [place] which has been associated with rapid progression of dementia   Past Medical History  Diagnosis Date  . Atrioventricular block, complete s/p AV ablation   . Pacemaker -CRT- [employer]   . Type II or unspecified type diabetes mellitus without mention of complication, not stated as uncontrolled   . CAD (coronary artery disease)     s/p redo bypass surgery in 1998 with patent graft in Jan. 2009.   . Ischemic cardiomyopathy     ischemic heart myopathy , ejection fraction of 25%  . Systolic heart failure     class II and euvolemic  . Permanent atrial fibrillation   . Hyperlipidemia   . Diabetes mellitus   . Premature ventricular contractions     frequent  . Elevated liver function tests     on amiodarone  . Hypertension   . Dementia     Past Surgical History  Procedure Laterality Date  . Coronary artery bypass graft  1998    CABG x5: RIMA to diagonal one, RIMA to diagonal 2, SVG to PD, SVG to PL, SVG to circ  . Coronary artery bypass graft  1985    x3: LIMA to LAD, SVG to PD and SVG to PL  . Cardiac caths      multile  . Pacer generator change out      [employer] Jude pacemaker  . Amputation Right 05/11/2014    Procedure: AMPUTATION BELOW KNEE;  Surgeon: Newt Minion, MD;  Location: [place];  Service: Orthopedics;  Laterality: Right;    Current Outpatient Prescriptions  Medication Sig Dispense Refill  . aspirin EC 81 MG EC tablet Take 1 tablet (81 mg total) by mouth daily.    Marland Kitchen atorvastatin (LIPITOR) 40 MG tablet Take 40 mg by mouth daily.    . bisoprolol (ZEBETA) 10 MG tablet Take 1 tablet (10 mg total) by mouth daily. 30 tablet 6  . Cholecalciferol (VITAMIN D-3) 1000 UNITS CAPS Take 2 capsules by mouth daily.    Marland Kitchen enoxaparin (LOVENOX) 60 MG/0.6ML injection Inject 0.55 mLs (55 mg total) into the skin every 12 (twelve) hours. 0 Syringe   . ferrous sulfate 325 (65 FE) MG tablet Take 325 mg by mouth 2 (two) times daily with a meal.    . furosemide (LASIX) 40 MG tablet TAKE1/2 TABLET BY MOUTH EVERY DAY 30 tablet 1  . glimepiride (AMARYL) 1 MG tablet Take 1 mg by mouth daily with breakfast.     . HYDROcodone-acetaminophen (NORCO/VICODIN) 5-325 MG per tablet Take 1 tablet by mouth every 6 (six) hours as needed for moderate pain. 30 tablet 0  . losartan (COZAAR) 50 MG tablet Take 50 mg by mouth daily.     . Memantine HCl ER (NAMENDA XR) 28 MG CP24 Take 1 capsule by mouth daily.    Marland Kitchen  nitroGLYCERIN (NITRODUR - DOSED IN MG/24 HR) 0.2 mg/hr patch Place 0.2 mg onto the skin daily.    . pentoxifylline (TRENTAL) 400 MG CR tablet Take 400 mg by mouth 3 (three) times daily with meals.    . potassium chloride (K-DUR) 10 MEQ tablet TAKE ONE TABLET BY MOUTH ONCE DAILY 90 tablet 1  . sertraline (ZOLOFT) 50 MG tablet Take 50 mg by mouth daily.    Marland Kitchen warfarin (COUMADIN) 5 MG tablet Take 1 tablet (5 mg total) by mouth as directed. (Patient taking differently: Take 5-7.5 mg by mouth daily. 5mg  daily except for on Wednesday take 7.5mg ) 100 tablet 0   No current facility-administered medications for this visit.    No Known Allergies  Review of Systems negative except from HPI and PMH  Physical Exam BP 122/68 mmHg  Pulse 82  Ht 5\' 2"  (1.575 m)  Wt  Well developed and  well nourished in no acute distress HENT normal E scleral and icterus clear Neck Supple Device pocket well healed; without hematoma or erythema.  There is no tethering  Clear to ausculation  Regular rate and rhythm, 2/6 murmur preserved S2  Soft with active bowel sounds No clubbing cyanosis  Edema Alert and oriented, grossly normal motor and sensory function Skin Warm and Dry    Assessment and  Plan  Atrial fibrillation-permanent  Complete heart block S./P. AV node ablation  CRT-P.-[employer]  Hypertension  Orthostatic lightheadedness  Stable from rhythm point view

## 2014-12-01 DIAGNOSIS — Z89511 Acquired absence of right leg below knee: Secondary | ICD-10-CM | POA: Diagnosis not present

## 2014-12-01 DIAGNOSIS — E0859 Diabetes mellitus due to underlying condition with other circulatory complications: Secondary | ICD-10-CM | POA: Diagnosis not present

## 2014-12-01 DIAGNOSIS — I482 Chronic atrial fibrillation: Secondary | ICD-10-CM | POA: Diagnosis not present

## 2014-12-05 DIAGNOSIS — Z89511 Acquired absence of right leg below knee: Secondary | ICD-10-CM | POA: Diagnosis not present

## 2014-12-05 DIAGNOSIS — I482 Chronic atrial fibrillation: Secondary | ICD-10-CM | POA: Diagnosis not present

## 2014-12-05 DIAGNOSIS — E0859 Diabetes mellitus due to underlying condition with other circulatory complications: Secondary | ICD-10-CM | POA: Diagnosis not present

## 2014-12-07 ENCOUNTER — Encounter: Payer: Self-pay | Admitting: Internal Medicine

## 2014-12-12 DIAGNOSIS — M7540 Impingement syndrome of unspecified shoulder: Secondary | ICD-10-CM | POA: Diagnosis not present

## 2014-12-14 DIAGNOSIS — D689 Coagulation defect, unspecified: Secondary | ICD-10-CM | POA: Diagnosis not present

## 2014-12-14 DIAGNOSIS — Z7901 Long term (current) use of anticoagulants: Secondary | ICD-10-CM | POA: Diagnosis not present

## 2014-12-16 DIAGNOSIS — D689 Coagulation defect, unspecified: Secondary | ICD-10-CM | POA: Diagnosis not present

## 2014-12-16 DIAGNOSIS — E0859 Diabetes mellitus due to underlying condition with other circulatory complications: Secondary | ICD-10-CM | POA: Diagnosis not present

## 2014-12-16 DIAGNOSIS — Z89511 Acquired absence of right leg below knee: Secondary | ICD-10-CM | POA: Diagnosis not present

## 2014-12-16 DIAGNOSIS — Z7901 Long term (current) use of anticoagulants: Secondary | ICD-10-CM | POA: Diagnosis not present

## 2014-12-18 DIAGNOSIS — Z89511 Acquired absence of right leg below knee: Secondary | ICD-10-CM | POA: Diagnosis not present

## 2014-12-18 DIAGNOSIS — E0859 Diabetes mellitus due to underlying condition with other circulatory complications: Secondary | ICD-10-CM | POA: Diagnosis not present

## 2014-12-20 DIAGNOSIS — Z89511 Acquired absence of right leg below knee: Secondary | ICD-10-CM | POA: Diagnosis not present

## 2014-12-20 DIAGNOSIS — E0859 Diabetes mellitus due to underlying condition with other circulatory complications: Secondary | ICD-10-CM | POA: Diagnosis not present

## 2014-12-20 DIAGNOSIS — D689 Coagulation defect, unspecified: Secondary | ICD-10-CM | POA: Diagnosis not present

## 2014-12-20 DIAGNOSIS — Z7901 Long term (current) use of anticoagulants: Secondary | ICD-10-CM | POA: Diagnosis not present

## 2014-12-24 DIAGNOSIS — Z89511 Acquired absence of right leg below knee: Secondary | ICD-10-CM | POA: Diagnosis not present

## 2014-12-24 DIAGNOSIS — E0859 Diabetes mellitus due to underlying condition with other circulatory complications: Secondary | ICD-10-CM | POA: Diagnosis not present

## 2014-12-27 DIAGNOSIS — Z89511 Acquired absence of right leg below knee: Secondary | ICD-10-CM | POA: Diagnosis not present

## 2014-12-27 DIAGNOSIS — E0859 Diabetes mellitus due to underlying condition with other circulatory complications: Secondary | ICD-10-CM | POA: Diagnosis not present

## 2014-12-27 DIAGNOSIS — Z7901 Long term (current) use of anticoagulants: Secondary | ICD-10-CM | POA: Diagnosis not present

## 2014-12-27 DIAGNOSIS — D680 Von Willebrand's disease: Secondary | ICD-10-CM | POA: Diagnosis not present

## 2014-12-28 ENCOUNTER — Ambulatory Visit: Payer: Self-pay | Admitting: Internal Medicine

## 2014-12-28 DIAGNOSIS — Z5181 Encounter for therapeutic drug level monitoring: Secondary | ICD-10-CM

## 2014-12-28 DIAGNOSIS — I482 Chronic atrial fibrillation, unspecified: Secondary | ICD-10-CM

## 2014-12-30 DIAGNOSIS — Z89511 Acquired absence of right leg below knee: Secondary | ICD-10-CM | POA: Diagnosis not present

## 2014-12-30 DIAGNOSIS — E0859 Diabetes mellitus due to underlying condition with other circulatory complications: Secondary | ICD-10-CM | POA: Diagnosis not present

## 2014-12-30 DIAGNOSIS — D689 Coagulation defect, unspecified: Secondary | ICD-10-CM | POA: Diagnosis not present

## 2014-12-30 DIAGNOSIS — Z7901 Long term (current) use of anticoagulants: Secondary | ICD-10-CM | POA: Diagnosis not present

## 2015-01-01 DIAGNOSIS — E0859 Diabetes mellitus due to underlying condition with other circulatory complications: Secondary | ICD-10-CM | POA: Diagnosis not present

## 2015-01-01 DIAGNOSIS — Z89511 Acquired absence of right leg below knee: Secondary | ICD-10-CM | POA: Diagnosis not present

## 2015-01-02 DIAGNOSIS — Z7901 Long term (current) use of anticoagulants: Secondary | ICD-10-CM | POA: Diagnosis not present

## 2015-01-02 DIAGNOSIS — D689 Coagulation defect, unspecified: Secondary | ICD-10-CM | POA: Diagnosis not present

## 2015-01-03 DIAGNOSIS — E0859 Diabetes mellitus due to underlying condition with other circulatory complications: Secondary | ICD-10-CM | POA: Diagnosis not present

## 2015-01-03 DIAGNOSIS — Z89511 Acquired absence of right leg below knee: Secondary | ICD-10-CM | POA: Diagnosis not present

## 2015-01-05 DIAGNOSIS — Z7901 Long term (current) use of anticoagulants: Secondary | ICD-10-CM | POA: Diagnosis not present

## 2015-01-05 DIAGNOSIS — D689 Coagulation defect, unspecified: Secondary | ICD-10-CM | POA: Diagnosis not present

## 2015-01-05 DIAGNOSIS — Z89511 Acquired absence of right leg below knee: Secondary | ICD-10-CM | POA: Diagnosis not present

## 2015-01-05 DIAGNOSIS — E0859 Diabetes mellitus due to underlying condition with other circulatory complications: Secondary | ICD-10-CM | POA: Diagnosis not present

## 2015-01-06 DIAGNOSIS — E0859 Diabetes mellitus due to underlying condition with other circulatory complications: Secondary | ICD-10-CM | POA: Diagnosis not present

## 2015-01-06 DIAGNOSIS — Z89511 Acquired absence of right leg below knee: Secondary | ICD-10-CM | POA: Diagnosis not present

## 2015-01-07 DIAGNOSIS — Z89511 Acquired absence of right leg below knee: Secondary | ICD-10-CM | POA: Diagnosis not present

## 2015-01-07 DIAGNOSIS — E0859 Diabetes mellitus due to underlying condition with other circulatory complications: Secondary | ICD-10-CM | POA: Diagnosis not present

## 2015-01-09 DIAGNOSIS — E0859 Diabetes mellitus due to underlying condition with other circulatory complications: Secondary | ICD-10-CM | POA: Diagnosis not present

## 2015-01-09 DIAGNOSIS — Z89511 Acquired absence of right leg below knee: Secondary | ICD-10-CM | POA: Diagnosis not present

## 2015-01-10 DIAGNOSIS — E0859 Diabetes mellitus due to underlying condition with other circulatory complications: Secondary | ICD-10-CM | POA: Diagnosis not present

## 2015-01-10 DIAGNOSIS — Z89511 Acquired absence of right leg below knee: Secondary | ICD-10-CM | POA: Diagnosis not present

## 2015-01-11 DIAGNOSIS — E0859 Diabetes mellitus due to underlying condition with other circulatory complications: Secondary | ICD-10-CM | POA: Diagnosis not present

## 2015-01-11 DIAGNOSIS — Z89511 Acquired absence of right leg below knee: Secondary | ICD-10-CM | POA: Diagnosis not present

## 2015-01-12 DIAGNOSIS — E0859 Diabetes mellitus due to underlying condition with other circulatory complications: Secondary | ICD-10-CM | POA: Diagnosis not present

## 2015-01-12 DIAGNOSIS — Z89511 Acquired absence of right leg below knee: Secondary | ICD-10-CM | POA: Diagnosis not present

## 2015-01-13 DIAGNOSIS — I5022 Chronic systolic (congestive) heart failure: Secondary | ICD-10-CM | POA: Diagnosis not present

## 2015-01-13 DIAGNOSIS — I482 Chronic atrial fibrillation: Secondary | ICD-10-CM | POA: Diagnosis not present

## 2015-01-13 DIAGNOSIS — E1140 Type 2 diabetes mellitus with diabetic neuropathy, unspecified: Secondary | ICD-10-CM | POA: Diagnosis not present

## 2015-01-13 DIAGNOSIS — Z89511 Acquired absence of right leg below knee: Secondary | ICD-10-CM | POA: Diagnosis not present

## 2015-01-13 DIAGNOSIS — I10 Essential (primary) hypertension: Secondary | ICD-10-CM | POA: Diagnosis not present

## 2015-01-13 DIAGNOSIS — E0859 Diabetes mellitus due to underlying condition with other circulatory complications: Secondary | ICD-10-CM | POA: Diagnosis not present

## 2015-01-13 DIAGNOSIS — I739 Peripheral vascular disease, unspecified: Secondary | ICD-10-CM | POA: Diagnosis not present

## 2015-01-13 DIAGNOSIS — F0391 Unspecified dementia with behavioral disturbance: Secondary | ICD-10-CM | POA: Diagnosis not present

## 2015-01-15 DIAGNOSIS — Z89511 Acquired absence of right leg below knee: Secondary | ICD-10-CM | POA: Diagnosis not present

## 2015-01-15 DIAGNOSIS — I482 Chronic atrial fibrillation: Secondary | ICD-10-CM | POA: Diagnosis not present

## 2015-01-15 DIAGNOSIS — M6281 Muscle weakness (generalized): Secondary | ICD-10-CM | POA: Diagnosis not present

## 2015-01-16 DIAGNOSIS — Z89511 Acquired absence of right leg below knee: Secondary | ICD-10-CM | POA: Diagnosis not present

## 2015-01-16 DIAGNOSIS — I482 Chronic atrial fibrillation: Secondary | ICD-10-CM | POA: Diagnosis not present

## 2015-01-16 DIAGNOSIS — M6281 Muscle weakness (generalized): Secondary | ICD-10-CM | POA: Diagnosis not present

## 2015-01-16 DIAGNOSIS — D68318 Other hemorrhagic disorder due to intrinsic circulating anticoagulants, antibodies, or inhibitors: Secondary | ICD-10-CM | POA: Diagnosis not present

## 2015-01-16 DIAGNOSIS — Z7901 Long term (current) use of anticoagulants: Secondary | ICD-10-CM | POA: Diagnosis not present

## 2015-01-17 DIAGNOSIS — I482 Chronic atrial fibrillation: Secondary | ICD-10-CM | POA: Diagnosis not present

## 2015-01-17 DIAGNOSIS — Z89511 Acquired absence of right leg below knee: Secondary | ICD-10-CM | POA: Diagnosis not present

## 2015-01-17 DIAGNOSIS — M6281 Muscle weakness (generalized): Secondary | ICD-10-CM | POA: Diagnosis not present

## 2015-01-18 DIAGNOSIS — I482 Chronic atrial fibrillation: Secondary | ICD-10-CM | POA: Diagnosis not present

## 2015-01-18 DIAGNOSIS — M6281 Muscle weakness (generalized): Secondary | ICD-10-CM | POA: Diagnosis not present

## 2015-01-18 DIAGNOSIS — Z89511 Acquired absence of right leg below knee: Secondary | ICD-10-CM | POA: Diagnosis not present

## 2015-01-19 DIAGNOSIS — M6281 Muscle weakness (generalized): Secondary | ICD-10-CM | POA: Diagnosis not present

## 2015-01-19 DIAGNOSIS — Z89511 Acquired absence of right leg below knee: Secondary | ICD-10-CM | POA: Diagnosis not present

## 2015-01-19 DIAGNOSIS — I482 Chronic atrial fibrillation: Secondary | ICD-10-CM | POA: Diagnosis not present

## 2015-01-20 DIAGNOSIS — Z89511 Acquired absence of right leg below knee: Secondary | ICD-10-CM | POA: Diagnosis not present

## 2015-01-20 DIAGNOSIS — M6281 Muscle weakness (generalized): Secondary | ICD-10-CM | POA: Diagnosis not present

## 2015-01-20 DIAGNOSIS — I482 Chronic atrial fibrillation: Secondary | ICD-10-CM | POA: Diagnosis not present

## 2015-01-22 DIAGNOSIS — M6281 Muscle weakness (generalized): Secondary | ICD-10-CM | POA: Diagnosis not present

## 2015-01-22 DIAGNOSIS — I482 Chronic atrial fibrillation: Secondary | ICD-10-CM | POA: Diagnosis not present

## 2015-01-22 DIAGNOSIS — Z89511 Acquired absence of right leg below knee: Secondary | ICD-10-CM | POA: Diagnosis not present

## 2015-01-23 DIAGNOSIS — I482 Chronic atrial fibrillation: Secondary | ICD-10-CM | POA: Diagnosis not present

## 2015-01-23 DIAGNOSIS — M6281 Muscle weakness (generalized): Secondary | ICD-10-CM | POA: Diagnosis not present

## 2015-01-23 DIAGNOSIS — Z89511 Acquired absence of right leg below knee: Secondary | ICD-10-CM | POA: Diagnosis not present

## 2015-01-24 DIAGNOSIS — M6281 Muscle weakness (generalized): Secondary | ICD-10-CM | POA: Diagnosis not present

## 2015-01-24 DIAGNOSIS — I482 Chronic atrial fibrillation: Secondary | ICD-10-CM | POA: Diagnosis not present

## 2015-01-24 DIAGNOSIS — Z89511 Acquired absence of right leg below knee: Secondary | ICD-10-CM | POA: Diagnosis not present

## 2015-01-25 DIAGNOSIS — M6281 Muscle weakness (generalized): Secondary | ICD-10-CM | POA: Diagnosis not present

## 2015-01-25 DIAGNOSIS — I482 Chronic atrial fibrillation: Secondary | ICD-10-CM | POA: Diagnosis not present

## 2015-01-25 DIAGNOSIS — Z89511 Acquired absence of right leg below knee: Secondary | ICD-10-CM | POA: Diagnosis not present

## 2015-01-26 DIAGNOSIS — Z89511 Acquired absence of right leg below knee: Secondary | ICD-10-CM | POA: Diagnosis not present

## 2015-01-26 DIAGNOSIS — I482 Chronic atrial fibrillation: Secondary | ICD-10-CM | POA: Diagnosis not present

## 2015-01-26 DIAGNOSIS — M6281 Muscle weakness (generalized): Secondary | ICD-10-CM | POA: Diagnosis not present

## 2015-01-28 DIAGNOSIS — M6281 Muscle weakness (generalized): Secondary | ICD-10-CM | POA: Diagnosis not present

## 2015-01-28 DIAGNOSIS — Z89511 Acquired absence of right leg below knee: Secondary | ICD-10-CM | POA: Diagnosis not present

## 2015-01-28 DIAGNOSIS — I482 Chronic atrial fibrillation: Secondary | ICD-10-CM | POA: Diagnosis not present

## 2015-02-04 DIAGNOSIS — I482 Chronic atrial fibrillation: Secondary | ICD-10-CM | POA: Diagnosis not present

## 2015-02-04 DIAGNOSIS — M6281 Muscle weakness (generalized): Secondary | ICD-10-CM | POA: Diagnosis not present

## 2015-02-04 DIAGNOSIS — Z89511 Acquired absence of right leg below knee: Secondary | ICD-10-CM | POA: Diagnosis not present

## 2015-02-07 DIAGNOSIS — M6281 Muscle weakness (generalized): Secondary | ICD-10-CM | POA: Diagnosis not present

## 2015-02-07 DIAGNOSIS — Z89511 Acquired absence of right leg below knee: Secondary | ICD-10-CM | POA: Diagnosis not present

## 2015-02-07 DIAGNOSIS — I482 Chronic atrial fibrillation: Secondary | ICD-10-CM | POA: Diagnosis not present

## 2015-02-14 DIAGNOSIS — D689 Coagulation defect, unspecified: Secondary | ICD-10-CM | POA: Diagnosis not present

## 2015-02-20 DIAGNOSIS — E119 Type 2 diabetes mellitus without complications: Secondary | ICD-10-CM | POA: Diagnosis not present

## 2015-02-21 DIAGNOSIS — D689 Coagulation defect, unspecified: Secondary | ICD-10-CM | POA: Diagnosis not present

## 2015-02-27 NOTE — Progress Notes (Signed)
This encounter was created in error - please disregard.

## 2015-02-28 DIAGNOSIS — D689 Coagulation defect, unspecified: Secondary | ICD-10-CM | POA: Diagnosis not present

## 2015-03-06 DIAGNOSIS — D689 Coagulation defect, unspecified: Secondary | ICD-10-CM | POA: Diagnosis not present

## 2015-03-13 DIAGNOSIS — D689 Coagulation defect, unspecified: Secondary | ICD-10-CM | POA: Diagnosis not present

## 2015-03-17 DIAGNOSIS — Z89511 Acquired absence of right leg below knee: Secondary | ICD-10-CM | POA: Diagnosis not present

## 2015-03-17 DIAGNOSIS — E1140 Type 2 diabetes mellitus with diabetic neuropathy, unspecified: Secondary | ICD-10-CM | POA: Diagnosis not present

## 2015-03-17 DIAGNOSIS — I5022 Chronic systolic (congestive) heart failure: Secondary | ICD-10-CM | POA: Diagnosis not present

## 2015-03-17 DIAGNOSIS — I10 Essential (primary) hypertension: Secondary | ICD-10-CM | POA: Diagnosis not present

## 2015-03-17 DIAGNOSIS — F0391 Unspecified dementia with behavioral disturbance: Secondary | ICD-10-CM | POA: Diagnosis not present

## 2015-03-17 DIAGNOSIS — I482 Chronic atrial fibrillation: Secondary | ICD-10-CM | POA: Diagnosis not present

## 2015-04-09 DIAGNOSIS — R05 Cough: Secondary | ICD-10-CM | POA: Diagnosis not present

## 2015-04-09 DIAGNOSIS — R0989 Other specified symptoms and signs involving the circulatory and respiratory systems: Secondary | ICD-10-CM | POA: Diagnosis not present

## 2015-04-17 DIAGNOSIS — I739 Peripheral vascular disease, unspecified: Secondary | ICD-10-CM | POA: Diagnosis not present

## 2015-04-17 DIAGNOSIS — J40 Bronchitis, not specified as acute or chronic: Secondary | ICD-10-CM | POA: Diagnosis not present

## 2015-04-17 DIAGNOSIS — I10 Essential (primary) hypertension: Secondary | ICD-10-CM | POA: Diagnosis not present

## 2015-04-17 DIAGNOSIS — F0390 Unspecified dementia without behavioral disturbance: Secondary | ICD-10-CM | POA: Diagnosis not present

## 2015-04-17 DIAGNOSIS — R062 Wheezing: Secondary | ICD-10-CM | POA: Diagnosis not present

## 2015-04-17 DIAGNOSIS — R0989 Other specified symptoms and signs involving the circulatory and respiratory systems: Secondary | ICD-10-CM | POA: Diagnosis not present

## 2015-04-21 DIAGNOSIS — B351 Tinea unguium: Secondary | ICD-10-CM | POA: Diagnosis not present

## 2015-04-21 DIAGNOSIS — E119 Type 2 diabetes mellitus without complications: Secondary | ICD-10-CM | POA: Diagnosis not present

## 2015-04-21 DIAGNOSIS — I739 Peripheral vascular disease, unspecified: Secondary | ICD-10-CM | POA: Diagnosis not present

## 2015-04-21 DIAGNOSIS — M79671 Pain in right foot: Secondary | ICD-10-CM | POA: Diagnosis not present

## 2015-04-28 DIAGNOSIS — I10 Essential (primary) hypertension: Secondary | ICD-10-CM | POA: Diagnosis not present

## 2015-04-28 DIAGNOSIS — Z89511 Acquired absence of right leg below knee: Secondary | ICD-10-CM | POA: Diagnosis not present

## 2015-04-28 DIAGNOSIS — I2510 Atherosclerotic heart disease of native coronary artery without angina pectoris: Secondary | ICD-10-CM | POA: Diagnosis not present

## 2015-04-28 DIAGNOSIS — I739 Peripheral vascular disease, unspecified: Secondary | ICD-10-CM | POA: Diagnosis not present

## 2015-04-28 DIAGNOSIS — F0391 Unspecified dementia with behavioral disturbance: Secondary | ICD-10-CM | POA: Diagnosis not present

## 2015-04-28 DIAGNOSIS — I639 Cerebral infarction, unspecified: Secondary | ICD-10-CM | POA: Diagnosis not present

## 2015-04-28 DIAGNOSIS — I5022 Chronic systolic (congestive) heart failure: Secondary | ICD-10-CM | POA: Diagnosis not present

## 2015-04-28 DIAGNOSIS — I482 Chronic atrial fibrillation: Secondary | ICD-10-CM | POA: Diagnosis not present

## 2015-05-18 DIAGNOSIS — E559 Vitamin D deficiency, unspecified: Secondary | ICD-10-CM | POA: Diagnosis not present

## 2015-05-18 DIAGNOSIS — E119 Type 2 diabetes mellitus without complications: Secondary | ICD-10-CM | POA: Diagnosis not present

## 2015-05-18 DIAGNOSIS — D649 Anemia, unspecified: Secondary | ICD-10-CM | POA: Diagnosis not present

## 2015-05-18 DIAGNOSIS — E785 Hyperlipidemia, unspecified: Secondary | ICD-10-CM | POA: Diagnosis not present

## 2015-05-18 DIAGNOSIS — E039 Hypothyroidism, unspecified: Secondary | ICD-10-CM | POA: Diagnosis not present

## 2015-05-21 DIAGNOSIS — R0989 Other specified symptoms and signs involving the circulatory and respiratory systems: Secondary | ICD-10-CM | POA: Diagnosis not present

## 2015-05-23 DIAGNOSIS — I482 Chronic atrial fibrillation: Secondary | ICD-10-CM | POA: Diagnosis not present

## 2015-05-23 DIAGNOSIS — E119 Type 2 diabetes mellitus without complications: Secondary | ICD-10-CM | POA: Diagnosis not present

## 2015-05-23 DIAGNOSIS — I10 Essential (primary) hypertension: Secondary | ICD-10-CM | POA: Diagnosis not present

## 2015-05-23 DIAGNOSIS — F0390 Unspecified dementia without behavioral disturbance: Secondary | ICD-10-CM | POA: Diagnosis not present

## 2015-05-23 DIAGNOSIS — D649 Anemia, unspecified: Secondary | ICD-10-CM | POA: Diagnosis not present

## 2015-05-23 DIAGNOSIS — I509 Heart failure, unspecified: Secondary | ICD-10-CM | POA: Diagnosis not present

## 2015-05-24 DIAGNOSIS — I482 Chronic atrial fibrillation: Secondary | ICD-10-CM | POA: Diagnosis not present

## 2015-05-24 DIAGNOSIS — F0390 Unspecified dementia without behavioral disturbance: Secondary | ICD-10-CM | POA: Diagnosis not present

## 2015-05-24 DIAGNOSIS — I509 Heart failure, unspecified: Secondary | ICD-10-CM | POA: Diagnosis not present

## 2015-05-26 DIAGNOSIS — I509 Heart failure, unspecified: Secondary | ICD-10-CM | POA: Diagnosis not present

## 2015-05-26 DIAGNOSIS — F0390 Unspecified dementia without behavioral disturbance: Secondary | ICD-10-CM | POA: Diagnosis not present

## 2015-05-26 DIAGNOSIS — I482 Chronic atrial fibrillation: Secondary | ICD-10-CM | POA: Diagnosis not present

## 2015-05-26 DIAGNOSIS — E119 Type 2 diabetes mellitus without complications: Secondary | ICD-10-CM | POA: Diagnosis not present

## 2015-06-02 DIAGNOSIS — I2510 Atherosclerotic heart disease of native coronary artery without angina pectoris: Secondary | ICD-10-CM | POA: Diagnosis not present

## 2015-06-02 DIAGNOSIS — F0391 Unspecified dementia with behavioral disturbance: Secondary | ICD-10-CM | POA: Diagnosis not present

## 2015-06-02 DIAGNOSIS — I509 Heart failure, unspecified: Secondary | ICD-10-CM | POA: Diagnosis not present

## 2015-06-02 DIAGNOSIS — I482 Chronic atrial fibrillation: Secondary | ICD-10-CM | POA: Diagnosis not present

## 2015-06-02 DIAGNOSIS — Z89511 Acquired absence of right leg below knee: Secondary | ICD-10-CM | POA: Diagnosis not present

## 2015-06-02 DIAGNOSIS — I5022 Chronic systolic (congestive) heart failure: Secondary | ICD-10-CM | POA: Diagnosis not present

## 2015-06-02 DIAGNOSIS — I739 Peripheral vascular disease, unspecified: Secondary | ICD-10-CM | POA: Diagnosis not present

## 2015-06-13 ENCOUNTER — Other Ambulatory Visit: Payer: Self-pay | Admitting: Dermatology

## 2015-06-13 DIAGNOSIS — D485 Neoplasm of uncertain behavior of skin: Secondary | ICD-10-CM | POA: Diagnosis not present

## 2015-06-13 DIAGNOSIS — L820 Inflamed seborrheic keratosis: Secondary | ICD-10-CM | POA: Diagnosis not present

## 2015-06-15 DIAGNOSIS — R4181 Age-related cognitive decline: Secondary | ICD-10-CM | POA: Diagnosis not present

## 2015-06-15 DIAGNOSIS — Z89511 Acquired absence of right leg below knee: Secondary | ICD-10-CM | POA: Diagnosis not present

## 2015-06-15 DIAGNOSIS — F0391 Unspecified dementia with behavioral disturbance: Secondary | ICD-10-CM | POA: Diagnosis not present

## 2015-06-16 DIAGNOSIS — F0391 Unspecified dementia with behavioral disturbance: Secondary | ICD-10-CM | POA: Diagnosis not present

## 2015-06-16 DIAGNOSIS — Z89511 Acquired absence of right leg below knee: Secondary | ICD-10-CM | POA: Diagnosis not present

## 2015-06-16 DIAGNOSIS — R4181 Age-related cognitive decline: Secondary | ICD-10-CM | POA: Diagnosis not present

## 2015-06-19 DIAGNOSIS — Z89511 Acquired absence of right leg below knee: Secondary | ICD-10-CM | POA: Diagnosis not present

## 2015-06-19 DIAGNOSIS — F0391 Unspecified dementia with behavioral disturbance: Secondary | ICD-10-CM | POA: Diagnosis not present

## 2015-06-19 DIAGNOSIS — R4181 Age-related cognitive decline: Secondary | ICD-10-CM | POA: Diagnosis not present

## 2015-06-20 DIAGNOSIS — F0391 Unspecified dementia with behavioral disturbance: Secondary | ICD-10-CM | POA: Diagnosis not present

## 2015-06-20 DIAGNOSIS — Z89511 Acquired absence of right leg below knee: Secondary | ICD-10-CM | POA: Diagnosis not present

## 2015-06-20 DIAGNOSIS — R4181 Age-related cognitive decline: Secondary | ICD-10-CM | POA: Diagnosis not present

## 2015-06-21 DIAGNOSIS — Z89511 Acquired absence of right leg below knee: Secondary | ICD-10-CM | POA: Diagnosis not present

## 2015-06-21 DIAGNOSIS — R4181 Age-related cognitive decline: Secondary | ICD-10-CM | POA: Diagnosis not present

## 2015-06-21 DIAGNOSIS — F0391 Unspecified dementia with behavioral disturbance: Secondary | ICD-10-CM | POA: Diagnosis not present

## 2015-06-22 DIAGNOSIS — F0391 Unspecified dementia with behavioral disturbance: Secondary | ICD-10-CM | POA: Diagnosis not present

## 2015-06-22 DIAGNOSIS — Z89511 Acquired absence of right leg below knee: Secondary | ICD-10-CM | POA: Diagnosis not present

## 2015-06-22 DIAGNOSIS — R4181 Age-related cognitive decline: Secondary | ICD-10-CM | POA: Diagnosis not present

## 2015-06-23 DIAGNOSIS — Z89511 Acquired absence of right leg below knee: Secondary | ICD-10-CM | POA: Diagnosis not present

## 2015-06-23 DIAGNOSIS — R4181 Age-related cognitive decline: Secondary | ICD-10-CM | POA: Diagnosis not present

## 2015-06-23 DIAGNOSIS — F0391 Unspecified dementia with behavioral disturbance: Secondary | ICD-10-CM | POA: Diagnosis not present

## 2015-06-26 DIAGNOSIS — F0391 Unspecified dementia with behavioral disturbance: Secondary | ICD-10-CM | POA: Diagnosis not present

## 2015-06-26 DIAGNOSIS — Z89511 Acquired absence of right leg below knee: Secondary | ICD-10-CM | POA: Diagnosis not present

## 2015-06-26 DIAGNOSIS — R4181 Age-related cognitive decline: Secondary | ICD-10-CM | POA: Diagnosis not present

## 2015-06-27 DIAGNOSIS — F0391 Unspecified dementia with behavioral disturbance: Secondary | ICD-10-CM | POA: Diagnosis not present

## 2015-06-27 DIAGNOSIS — R4181 Age-related cognitive decline: Secondary | ICD-10-CM | POA: Diagnosis not present

## 2015-06-27 DIAGNOSIS — Z89511 Acquired absence of right leg below knee: Secondary | ICD-10-CM | POA: Diagnosis not present

## 2015-06-28 DIAGNOSIS — Z89511 Acquired absence of right leg below knee: Secondary | ICD-10-CM | POA: Diagnosis not present

## 2015-06-28 DIAGNOSIS — R4181 Age-related cognitive decline: Secondary | ICD-10-CM | POA: Diagnosis not present

## 2015-06-28 DIAGNOSIS — F0391 Unspecified dementia with behavioral disturbance: Secondary | ICD-10-CM | POA: Diagnosis not present

## 2015-06-29 DIAGNOSIS — F0391 Unspecified dementia with behavioral disturbance: Secondary | ICD-10-CM | POA: Diagnosis not present

## 2015-06-29 DIAGNOSIS — R4181 Age-related cognitive decline: Secondary | ICD-10-CM | POA: Diagnosis not present

## 2015-06-29 DIAGNOSIS — Z89511 Acquired absence of right leg below knee: Secondary | ICD-10-CM | POA: Diagnosis not present

## 2015-06-30 DIAGNOSIS — F0391 Unspecified dementia with behavioral disturbance: Secondary | ICD-10-CM | POA: Diagnosis not present

## 2015-06-30 DIAGNOSIS — R4181 Age-related cognitive decline: Secondary | ICD-10-CM | POA: Diagnosis not present

## 2015-06-30 DIAGNOSIS — Z89511 Acquired absence of right leg below knee: Secondary | ICD-10-CM | POA: Diagnosis not present

## 2015-07-02 DIAGNOSIS — Z89511 Acquired absence of right leg below knee: Secondary | ICD-10-CM | POA: Diagnosis not present

## 2015-07-02 DIAGNOSIS — R4181 Age-related cognitive decline: Secondary | ICD-10-CM | POA: Diagnosis not present

## 2015-07-02 DIAGNOSIS — F0391 Unspecified dementia with behavioral disturbance: Secondary | ICD-10-CM | POA: Diagnosis not present

## 2015-07-03 DIAGNOSIS — F0391 Unspecified dementia with behavioral disturbance: Secondary | ICD-10-CM | POA: Diagnosis not present

## 2015-07-03 DIAGNOSIS — Z89511 Acquired absence of right leg below knee: Secondary | ICD-10-CM | POA: Diagnosis not present

## 2015-07-03 DIAGNOSIS — R4181 Age-related cognitive decline: Secondary | ICD-10-CM | POA: Diagnosis not present

## 2015-07-04 DIAGNOSIS — M25511 Pain in right shoulder: Secondary | ICD-10-CM | POA: Diagnosis not present

## 2015-07-04 DIAGNOSIS — M79621 Pain in right upper arm: Secondary | ICD-10-CM | POA: Diagnosis not present

## 2015-07-04 DIAGNOSIS — M79631 Pain in right forearm: Secondary | ICD-10-CM | POA: Diagnosis not present

## 2015-07-04 DIAGNOSIS — M7981 Nontraumatic hematoma of soft tissue: Secondary | ICD-10-CM | POA: Diagnosis not present

## 2015-07-05 DIAGNOSIS — R4181 Age-related cognitive decline: Secondary | ICD-10-CM | POA: Diagnosis not present

## 2015-07-05 DIAGNOSIS — Z89511 Acquired absence of right leg below knee: Secondary | ICD-10-CM | POA: Diagnosis not present

## 2015-07-05 DIAGNOSIS — F0391 Unspecified dementia with behavioral disturbance: Secondary | ICD-10-CM | POA: Diagnosis not present

## 2015-07-06 DIAGNOSIS — R4181 Age-related cognitive decline: Secondary | ICD-10-CM | POA: Diagnosis not present

## 2015-07-06 DIAGNOSIS — Z89511 Acquired absence of right leg below knee: Secondary | ICD-10-CM | POA: Diagnosis not present

## 2015-07-06 DIAGNOSIS — F0391 Unspecified dementia with behavioral disturbance: Secondary | ICD-10-CM | POA: Diagnosis not present

## 2015-07-07 DIAGNOSIS — R4181 Age-related cognitive decline: Secondary | ICD-10-CM | POA: Diagnosis not present

## 2015-07-07 DIAGNOSIS — Z89511 Acquired absence of right leg below knee: Secondary | ICD-10-CM | POA: Diagnosis not present

## 2015-07-07 DIAGNOSIS — F0391 Unspecified dementia with behavioral disturbance: Secondary | ICD-10-CM | POA: Diagnosis not present

## 2015-07-10 DIAGNOSIS — Z89511 Acquired absence of right leg below knee: Secondary | ICD-10-CM | POA: Diagnosis not present

## 2015-07-10 DIAGNOSIS — R4181 Age-related cognitive decline: Secondary | ICD-10-CM | POA: Diagnosis not present

## 2015-07-10 DIAGNOSIS — F0391 Unspecified dementia with behavioral disturbance: Secondary | ICD-10-CM | POA: Diagnosis not present

## 2015-07-11 DIAGNOSIS — R4181 Age-related cognitive decline: Secondary | ICD-10-CM | POA: Diagnosis not present

## 2015-07-11 DIAGNOSIS — Z89511 Acquired absence of right leg below knee: Secondary | ICD-10-CM | POA: Diagnosis not present

## 2015-07-11 DIAGNOSIS — F0391 Unspecified dementia with behavioral disturbance: Secondary | ICD-10-CM | POA: Diagnosis not present

## 2015-07-12 DIAGNOSIS — F0391 Unspecified dementia with behavioral disturbance: Secondary | ICD-10-CM | POA: Diagnosis not present

## 2015-07-12 DIAGNOSIS — Z89511 Acquired absence of right leg below knee: Secondary | ICD-10-CM | POA: Diagnosis not present

## 2015-07-12 DIAGNOSIS — R4181 Age-related cognitive decline: Secondary | ICD-10-CM | POA: Diagnosis not present

## 2015-07-13 DIAGNOSIS — R4181 Age-related cognitive decline: Secondary | ICD-10-CM | POA: Diagnosis not present

## 2015-07-13 DIAGNOSIS — F0391 Unspecified dementia with behavioral disturbance: Secondary | ICD-10-CM | POA: Diagnosis not present

## 2015-07-13 DIAGNOSIS — Z89511 Acquired absence of right leg below knee: Secondary | ICD-10-CM | POA: Diagnosis not present

## 2015-07-14 DIAGNOSIS — F0391 Unspecified dementia with behavioral disturbance: Secondary | ICD-10-CM | POA: Diagnosis not present

## 2015-07-14 DIAGNOSIS — Z89511 Acquired absence of right leg below knee: Secondary | ICD-10-CM | POA: Diagnosis not present

## 2015-07-14 DIAGNOSIS — R4181 Age-related cognitive decline: Secondary | ICD-10-CM | POA: Diagnosis not present

## 2015-07-21 DIAGNOSIS — E119 Type 2 diabetes mellitus without complications: Secondary | ICD-10-CM | POA: Diagnosis not present

## 2015-07-21 DIAGNOSIS — M79672 Pain in left foot: Secondary | ICD-10-CM | POA: Diagnosis not present

## 2015-07-21 DIAGNOSIS — I739 Peripheral vascular disease, unspecified: Secondary | ICD-10-CM | POA: Diagnosis not present

## 2015-07-21 DIAGNOSIS — B351 Tinea unguium: Secondary | ICD-10-CM | POA: Diagnosis not present

## 2015-08-09 DIAGNOSIS — D649 Anemia, unspecified: Secondary | ICD-10-CM | POA: Diagnosis not present

## 2015-08-11 DIAGNOSIS — I639 Cerebral infarction, unspecified: Secondary | ICD-10-CM | POA: Diagnosis not present

## 2015-08-11 DIAGNOSIS — I739 Peripheral vascular disease, unspecified: Secondary | ICD-10-CM | POA: Diagnosis not present

## 2015-08-11 DIAGNOSIS — F0391 Unspecified dementia with behavioral disturbance: Secondary | ICD-10-CM | POA: Diagnosis not present

## 2015-08-11 DIAGNOSIS — I5022 Chronic systolic (congestive) heart failure: Secondary | ICD-10-CM | POA: Diagnosis not present

## 2015-08-11 DIAGNOSIS — I10 Essential (primary) hypertension: Secondary | ICD-10-CM | POA: Diagnosis not present

## 2015-08-11 DIAGNOSIS — I482 Chronic atrial fibrillation: Secondary | ICD-10-CM | POA: Diagnosis not present

## 2015-08-11 DIAGNOSIS — Z89511 Acquired absence of right leg below knee: Secondary | ICD-10-CM | POA: Diagnosis not present

## 2015-08-15 DIAGNOSIS — Z79899 Other long term (current) drug therapy: Secondary | ICD-10-CM | POA: Diagnosis not present

## 2015-08-15 DIAGNOSIS — E119 Type 2 diabetes mellitus without complications: Secondary | ICD-10-CM | POA: Diagnosis not present

## 2015-08-15 DIAGNOSIS — E1165 Type 2 diabetes mellitus with hyperglycemia: Secondary | ICD-10-CM | POA: Diagnosis not present

## 2015-08-21 DIAGNOSIS — E119 Type 2 diabetes mellitus without complications: Secondary | ICD-10-CM | POA: Diagnosis not present

## 2015-08-21 DIAGNOSIS — H04123 Dry eye syndrome of bilateral lacrimal glands: Secondary | ICD-10-CM | POA: Diagnosis not present

## 2015-08-21 DIAGNOSIS — Z961 Presence of intraocular lens: Secondary | ICD-10-CM | POA: Diagnosis not present

## 2015-08-23 NOTE — Progress Notes (Signed)
This encounter was created in error - please disregard.

## 2015-09-01 DIAGNOSIS — E119 Type 2 diabetes mellitus without complications: Secondary | ICD-10-CM | POA: Diagnosis not present

## 2015-09-01 DIAGNOSIS — F0391 Unspecified dementia with behavioral disturbance: Secondary | ICD-10-CM | POA: Diagnosis not present

## 2015-09-01 DIAGNOSIS — I10 Essential (primary) hypertension: Secondary | ICD-10-CM | POA: Diagnosis not present

## 2015-09-01 DIAGNOSIS — I482 Chronic atrial fibrillation: Secondary | ICD-10-CM | POA: Diagnosis not present

## 2015-09-01 DIAGNOSIS — Z89511 Acquired absence of right leg below knee: Secondary | ICD-10-CM | POA: Diagnosis not present

## 2015-09-01 DIAGNOSIS — I739 Peripheral vascular disease, unspecified: Secondary | ICD-10-CM | POA: Diagnosis not present

## 2015-09-16 DIAGNOSIS — M6281 Muscle weakness (generalized): Secondary | ICD-10-CM | POA: Diagnosis not present

## 2015-09-16 DIAGNOSIS — Z89511 Acquired absence of right leg below knee: Secondary | ICD-10-CM | POA: Diagnosis not present

## 2015-09-17 DIAGNOSIS — Z89511 Acquired absence of right leg below knee: Secondary | ICD-10-CM | POA: Diagnosis not present

## 2015-09-17 DIAGNOSIS — M6281 Muscle weakness (generalized): Secondary | ICD-10-CM | POA: Diagnosis not present

## 2015-09-18 DIAGNOSIS — Z89511 Acquired absence of right leg below knee: Secondary | ICD-10-CM | POA: Diagnosis not present

## 2015-09-18 DIAGNOSIS — M6281 Muscle weakness (generalized): Secondary | ICD-10-CM | POA: Diagnosis not present

## 2015-09-19 DIAGNOSIS — M6281 Muscle weakness (generalized): Secondary | ICD-10-CM | POA: Diagnosis not present

## 2015-09-19 DIAGNOSIS — Z89511 Acquired absence of right leg below knee: Secondary | ICD-10-CM | POA: Diagnosis not present

## 2015-09-20 DIAGNOSIS — Z89511 Acquired absence of right leg below knee: Secondary | ICD-10-CM | POA: Diagnosis not present

## 2015-09-20 DIAGNOSIS — M6281 Muscle weakness (generalized): Secondary | ICD-10-CM | POA: Diagnosis not present

## 2015-09-25 DIAGNOSIS — M6281 Muscle weakness (generalized): Secondary | ICD-10-CM | POA: Diagnosis not present

## 2015-09-25 DIAGNOSIS — Z89511 Acquired absence of right leg below knee: Secondary | ICD-10-CM | POA: Diagnosis not present

## 2015-09-26 DIAGNOSIS — M6281 Muscle weakness (generalized): Secondary | ICD-10-CM | POA: Diagnosis not present

## 2015-09-26 DIAGNOSIS — Z89511 Acquired absence of right leg below knee: Secondary | ICD-10-CM | POA: Diagnosis not present

## 2015-09-27 DIAGNOSIS — Z89511 Acquired absence of right leg below knee: Secondary | ICD-10-CM | POA: Diagnosis not present

## 2015-09-27 DIAGNOSIS — M6281 Muscle weakness (generalized): Secondary | ICD-10-CM | POA: Diagnosis not present

## 2015-09-28 DIAGNOSIS — Z89511 Acquired absence of right leg below knee: Secondary | ICD-10-CM | POA: Diagnosis not present

## 2015-09-28 DIAGNOSIS — M6281 Muscle weakness (generalized): Secondary | ICD-10-CM | POA: Diagnosis not present

## 2015-09-29 DIAGNOSIS — Z89511 Acquired absence of right leg below knee: Secondary | ICD-10-CM | POA: Diagnosis not present

## 2015-09-29 DIAGNOSIS — M6281 Muscle weakness (generalized): Secondary | ICD-10-CM | POA: Diagnosis not present

## 2015-09-30 DIAGNOSIS — Z89511 Acquired absence of right leg below knee: Secondary | ICD-10-CM | POA: Diagnosis not present

## 2015-09-30 DIAGNOSIS — M6281 Muscle weakness (generalized): Secondary | ICD-10-CM | POA: Diagnosis not present

## 2015-10-02 DIAGNOSIS — M6281 Muscle weakness (generalized): Secondary | ICD-10-CM | POA: Diagnosis not present

## 2015-10-02 DIAGNOSIS — Z89511 Acquired absence of right leg below knee: Secondary | ICD-10-CM | POA: Diagnosis not present

## 2015-10-03 DIAGNOSIS — M6281 Muscle weakness (generalized): Secondary | ICD-10-CM | POA: Diagnosis not present

## 2015-10-03 DIAGNOSIS — Z89511 Acquired absence of right leg below knee: Secondary | ICD-10-CM | POA: Diagnosis not present

## 2015-10-04 DIAGNOSIS — M6281 Muscle weakness (generalized): Secondary | ICD-10-CM | POA: Diagnosis not present

## 2015-10-04 DIAGNOSIS — Z89511 Acquired absence of right leg below knee: Secondary | ICD-10-CM | POA: Diagnosis not present

## 2015-10-05 DIAGNOSIS — Z89511 Acquired absence of right leg below knee: Secondary | ICD-10-CM | POA: Diagnosis not present

## 2015-10-05 DIAGNOSIS — M6281 Muscle weakness (generalized): Secondary | ICD-10-CM | POA: Diagnosis not present

## 2015-10-08 DIAGNOSIS — I509 Heart failure, unspecified: Secondary | ICD-10-CM | POA: Diagnosis not present

## 2015-10-08 DIAGNOSIS — Z89511 Acquired absence of right leg below knee: Secondary | ICD-10-CM | POA: Diagnosis not present

## 2015-10-08 DIAGNOSIS — D649 Anemia, unspecified: Secondary | ICD-10-CM | POA: Diagnosis not present

## 2015-10-08 DIAGNOSIS — I639 Cerebral infarction, unspecified: Secondary | ICD-10-CM | POA: Diagnosis not present

## 2015-10-08 DIAGNOSIS — F0391 Unspecified dementia with behavioral disturbance: Secondary | ICD-10-CM | POA: Diagnosis not present

## 2015-10-08 DIAGNOSIS — I10 Essential (primary) hypertension: Secondary | ICD-10-CM | POA: Diagnosis not present

## 2015-10-08 DIAGNOSIS — I739 Peripheral vascular disease, unspecified: Secondary | ICD-10-CM | POA: Diagnosis not present

## 2015-10-09 DIAGNOSIS — M6281 Muscle weakness (generalized): Secondary | ICD-10-CM | POA: Diagnosis not present

## 2015-10-09 DIAGNOSIS — Z89511 Acquired absence of right leg below knee: Secondary | ICD-10-CM | POA: Diagnosis not present

## 2015-10-10 DIAGNOSIS — M6281 Muscle weakness (generalized): Secondary | ICD-10-CM | POA: Diagnosis not present

## 2015-10-10 DIAGNOSIS — Z89511 Acquired absence of right leg below knee: Secondary | ICD-10-CM | POA: Diagnosis not present

## 2015-10-11 DIAGNOSIS — Z89511 Acquired absence of right leg below knee: Secondary | ICD-10-CM | POA: Diagnosis not present

## 2015-10-11 DIAGNOSIS — M6281 Muscle weakness (generalized): Secondary | ICD-10-CM | POA: Diagnosis not present

## 2015-10-12 DIAGNOSIS — M6281 Muscle weakness (generalized): Secondary | ICD-10-CM | POA: Diagnosis not present

## 2015-10-12 DIAGNOSIS — Z89511 Acquired absence of right leg below knee: Secondary | ICD-10-CM | POA: Diagnosis not present

## 2015-10-14 DIAGNOSIS — Z89511 Acquired absence of right leg below knee: Secondary | ICD-10-CM | POA: Diagnosis not present

## 2015-10-14 DIAGNOSIS — M6281 Muscle weakness (generalized): Secondary | ICD-10-CM | POA: Diagnosis not present

## 2015-10-16 DIAGNOSIS — Z89511 Acquired absence of right leg below knee: Secondary | ICD-10-CM | POA: Diagnosis not present

## 2015-10-16 DIAGNOSIS — M6281 Muscle weakness (generalized): Secondary | ICD-10-CM | POA: Diagnosis not present

## 2015-10-18 DIAGNOSIS — M6281 Muscle weakness (generalized): Secondary | ICD-10-CM | POA: Diagnosis not present

## 2015-10-18 DIAGNOSIS — Z89511 Acquired absence of right leg below knee: Secondary | ICD-10-CM | POA: Diagnosis not present

## 2015-10-19 DIAGNOSIS — Z89511 Acquired absence of right leg below knee: Secondary | ICD-10-CM | POA: Diagnosis not present

## 2015-10-19 DIAGNOSIS — M6281 Muscle weakness (generalized): Secondary | ICD-10-CM | POA: Diagnosis not present

## 2015-10-20 DIAGNOSIS — M6281 Muscle weakness (generalized): Secondary | ICD-10-CM | POA: Diagnosis not present

## 2015-10-20 DIAGNOSIS — Z89511 Acquired absence of right leg below knee: Secondary | ICD-10-CM | POA: Diagnosis not present

## 2015-10-22 DIAGNOSIS — Z89511 Acquired absence of right leg below knee: Secondary | ICD-10-CM | POA: Diagnosis not present

## 2015-10-22 DIAGNOSIS — M6281 Muscle weakness (generalized): Secondary | ICD-10-CM | POA: Diagnosis not present

## 2015-10-29 DIAGNOSIS — I2510 Atherosclerotic heart disease of native coronary artery without angina pectoris: Secondary | ICD-10-CM | POA: Diagnosis not present

## 2015-10-29 DIAGNOSIS — I10 Essential (primary) hypertension: Secondary | ICD-10-CM | POA: Diagnosis not present

## 2015-10-29 DIAGNOSIS — Z89511 Acquired absence of right leg below knee: Secondary | ICD-10-CM | POA: Diagnosis not present

## 2015-10-29 DIAGNOSIS — I739 Peripheral vascular disease, unspecified: Secondary | ICD-10-CM | POA: Diagnosis not present

## 2015-10-29 DIAGNOSIS — F0391 Unspecified dementia with behavioral disturbance: Secondary | ICD-10-CM | POA: Diagnosis not present

## 2015-10-29 DIAGNOSIS — I482 Chronic atrial fibrillation: Secondary | ICD-10-CM | POA: Diagnosis not present

## 2015-10-29 DIAGNOSIS — I639 Cerebral infarction, unspecified: Secondary | ICD-10-CM | POA: Diagnosis not present

## 2015-10-29 DIAGNOSIS — E1140 Type 2 diabetes mellitus with diabetic neuropathy, unspecified: Secondary | ICD-10-CM | POA: Diagnosis not present

## 2015-10-30 DIAGNOSIS — M79671 Pain in right foot: Secondary | ICD-10-CM | POA: Diagnosis not present

## 2015-10-30 DIAGNOSIS — E119 Type 2 diabetes mellitus without complications: Secondary | ICD-10-CM | POA: Diagnosis not present

## 2015-10-30 DIAGNOSIS — I739 Peripheral vascular disease, unspecified: Secondary | ICD-10-CM | POA: Diagnosis not present

## 2015-10-30 DIAGNOSIS — B351 Tinea unguium: Secondary | ICD-10-CM | POA: Diagnosis not present

## 2015-11-15 DIAGNOSIS — E119 Type 2 diabetes mellitus without complications: Secondary | ICD-10-CM | POA: Diagnosis not present

## 2015-11-15 DIAGNOSIS — D649 Anemia, unspecified: Secondary | ICD-10-CM | POA: Diagnosis not present

## 2015-11-15 DIAGNOSIS — Z79899 Other long term (current) drug therapy: Secondary | ICD-10-CM | POA: Diagnosis not present

## 2015-11-15 DIAGNOSIS — E785 Hyperlipidemia, unspecified: Secondary | ICD-10-CM | POA: Diagnosis not present

## 2015-11-15 DIAGNOSIS — I10 Essential (primary) hypertension: Secondary | ICD-10-CM | POA: Diagnosis not present

## 2015-12-01 DIAGNOSIS — E1140 Type 2 diabetes mellitus with diabetic neuropathy, unspecified: Secondary | ICD-10-CM | POA: Diagnosis not present

## 2015-12-01 DIAGNOSIS — Z89511 Acquired absence of right leg below knee: Secondary | ICD-10-CM | POA: Diagnosis not present

## 2015-12-01 DIAGNOSIS — I639 Cerebral infarction, unspecified: Secondary | ICD-10-CM | POA: Diagnosis not present

## 2015-12-01 DIAGNOSIS — I482 Chronic atrial fibrillation: Secondary | ICD-10-CM | POA: Diagnosis not present

## 2015-12-01 DIAGNOSIS — F0391 Unspecified dementia with behavioral disturbance: Secondary | ICD-10-CM | POA: Diagnosis not present

## 2015-12-01 DIAGNOSIS — I509 Heart failure, unspecified: Secondary | ICD-10-CM | POA: Diagnosis not present

## 2015-12-01 DIAGNOSIS — I10 Essential (primary) hypertension: Secondary | ICD-10-CM | POA: Diagnosis not present

## 2015-12-01 DIAGNOSIS — I739 Peripheral vascular disease, unspecified: Secondary | ICD-10-CM | POA: Diagnosis not present

## 2015-12-29 DIAGNOSIS — F0391 Unspecified dementia with behavioral disturbance: Secondary | ICD-10-CM | POA: Diagnosis not present

## 2015-12-29 DIAGNOSIS — I10 Essential (primary) hypertension: Secondary | ICD-10-CM | POA: Diagnosis not present

## 2015-12-29 DIAGNOSIS — I509 Heart failure, unspecified: Secondary | ICD-10-CM | POA: Diagnosis not present

## 2015-12-29 DIAGNOSIS — I482 Chronic atrial fibrillation: Secondary | ICD-10-CM | POA: Diagnosis not present

## 2015-12-29 DIAGNOSIS — Z89511 Acquired absence of right leg below knee: Secondary | ICD-10-CM | POA: Diagnosis not present

## 2015-12-29 DIAGNOSIS — I739 Peripheral vascular disease, unspecified: Secondary | ICD-10-CM | POA: Diagnosis not present

## 2016-01-10 DIAGNOSIS — R05 Cough: Secondary | ICD-10-CM | POA: Diagnosis not present

## 2016-01-10 DIAGNOSIS — I482 Chronic atrial fibrillation: Secondary | ICD-10-CM | POA: Diagnosis not present

## 2016-01-10 DIAGNOSIS — I10 Essential (primary) hypertension: Secondary | ICD-10-CM | POA: Diagnosis not present

## 2016-01-10 DIAGNOSIS — I509 Heart failure, unspecified: Secondary | ICD-10-CM | POA: Diagnosis not present

## 2016-01-10 DIAGNOSIS — R0602 Shortness of breath: Secondary | ICD-10-CM | POA: Diagnosis not present

## 2016-01-10 DIAGNOSIS — J40 Bronchitis, not specified as acute or chronic: Secondary | ICD-10-CM | POA: Diagnosis not present

## 2016-01-10 DIAGNOSIS — R0989 Other specified symptoms and signs involving the circulatory and respiratory systems: Secondary | ICD-10-CM | POA: Diagnosis not present

## 2016-01-26 DIAGNOSIS — Z89511 Acquired absence of right leg below knee: Secondary | ICD-10-CM | POA: Diagnosis not present

## 2016-01-26 DIAGNOSIS — I509 Heart failure, unspecified: Secondary | ICD-10-CM | POA: Diagnosis not present

## 2016-01-26 DIAGNOSIS — I639 Cerebral infarction, unspecified: Secondary | ICD-10-CM | POA: Diagnosis not present

## 2016-01-26 DIAGNOSIS — I739 Peripheral vascular disease, unspecified: Secondary | ICD-10-CM | POA: Diagnosis not present

## 2016-01-26 DIAGNOSIS — F0391 Unspecified dementia with behavioral disturbance: Secondary | ICD-10-CM | POA: Diagnosis not present

## 2016-01-26 DIAGNOSIS — I10 Essential (primary) hypertension: Secondary | ICD-10-CM | POA: Diagnosis not present

## 2016-02-09 DIAGNOSIS — I10 Essential (primary) hypertension: Secondary | ICD-10-CM | POA: Diagnosis not present

## 2016-02-09 DIAGNOSIS — W19XXXD Unspecified fall, subsequent encounter: Secondary | ICD-10-CM | POA: Diagnosis not present

## 2016-02-09 DIAGNOSIS — F0390 Unspecified dementia without behavioral disturbance: Secondary | ICD-10-CM | POA: Diagnosis not present

## 2016-02-09 DIAGNOSIS — I482 Chronic atrial fibrillation: Secondary | ICD-10-CM | POA: Diagnosis not present

## 2016-02-10 DIAGNOSIS — R319 Hematuria, unspecified: Secondary | ICD-10-CM | POA: Diagnosis not present

## 2016-02-10 DIAGNOSIS — M545 Low back pain: Secondary | ICD-10-CM | POA: Diagnosis not present

## 2016-02-10 DIAGNOSIS — N390 Urinary tract infection, site not specified: Secondary | ICD-10-CM | POA: Diagnosis not present

## 2016-02-13 DIAGNOSIS — I482 Chronic atrial fibrillation: Secondary | ICD-10-CM | POA: Diagnosis not present

## 2016-02-13 DIAGNOSIS — W19XXXD Unspecified fall, subsequent encounter: Secondary | ICD-10-CM | POA: Diagnosis not present

## 2016-02-13 DIAGNOSIS — I509 Heart failure, unspecified: Secondary | ICD-10-CM | POA: Diagnosis not present

## 2016-02-13 DIAGNOSIS — R079 Chest pain, unspecified: Secondary | ICD-10-CM | POA: Diagnosis not present

## 2016-02-13 DIAGNOSIS — E119 Type 2 diabetes mellitus without complications: Secondary | ICD-10-CM | POA: Diagnosis not present

## 2016-02-15 DIAGNOSIS — E119 Type 2 diabetes mellitus without complications: Secondary | ICD-10-CM | POA: Diagnosis not present

## 2016-03-01 DIAGNOSIS — I10 Essential (primary) hypertension: Secondary | ICD-10-CM | POA: Diagnosis not present

## 2016-03-01 DIAGNOSIS — I2510 Atherosclerotic heart disease of native coronary artery without angina pectoris: Secondary | ICD-10-CM | POA: Diagnosis not present

## 2016-03-01 DIAGNOSIS — I739 Peripheral vascular disease, unspecified: Secondary | ICD-10-CM | POA: Diagnosis not present

## 2016-03-01 DIAGNOSIS — I639 Cerebral infarction, unspecified: Secondary | ICD-10-CM | POA: Diagnosis not present

## 2016-03-01 DIAGNOSIS — F0391 Unspecified dementia with behavioral disturbance: Secondary | ICD-10-CM | POA: Diagnosis not present

## 2016-03-01 DIAGNOSIS — Z89511 Acquired absence of right leg below knee: Secondary | ICD-10-CM | POA: Diagnosis not present

## 2016-03-01 DIAGNOSIS — I509 Heart failure, unspecified: Secondary | ICD-10-CM | POA: Diagnosis not present

## 2016-03-01 DIAGNOSIS — I4891 Unspecified atrial fibrillation: Secondary | ICD-10-CM | POA: Diagnosis not present

## 2016-04-05 DIAGNOSIS — I4891 Unspecified atrial fibrillation: Secondary | ICD-10-CM | POA: Diagnosis not present

## 2016-04-05 DIAGNOSIS — F0391 Unspecified dementia with behavioral disturbance: Secondary | ICD-10-CM | POA: Diagnosis not present

## 2016-04-05 DIAGNOSIS — I639 Cerebral infarction, unspecified: Secondary | ICD-10-CM | POA: Diagnosis not present

## 2016-04-05 DIAGNOSIS — I509 Heart failure, unspecified: Secondary | ICD-10-CM | POA: Diagnosis not present

## 2016-04-05 DIAGNOSIS — I739 Peripheral vascular disease, unspecified: Secondary | ICD-10-CM | POA: Diagnosis not present

## 2016-04-05 DIAGNOSIS — E1140 Type 2 diabetes mellitus with diabetic neuropathy, unspecified: Secondary | ICD-10-CM | POA: Diagnosis not present

## 2016-04-05 DIAGNOSIS — I10 Essential (primary) hypertension: Secondary | ICD-10-CM | POA: Diagnosis not present

## 2016-04-05 DIAGNOSIS — Z89511 Acquired absence of right leg below knee: Secondary | ICD-10-CM | POA: Diagnosis not present

## 2016-05-02 DIAGNOSIS — I639 Cerebral infarction, unspecified: Secondary | ICD-10-CM | POA: Diagnosis not present

## 2016-05-02 DIAGNOSIS — I509 Heart failure, unspecified: Secondary | ICD-10-CM | POA: Diagnosis not present

## 2016-05-02 DIAGNOSIS — I10 Essential (primary) hypertension: Secondary | ICD-10-CM | POA: Diagnosis not present

## 2016-05-02 DIAGNOSIS — F0391 Unspecified dementia with behavioral disturbance: Secondary | ICD-10-CM | POA: Diagnosis not present

## 2016-05-02 DIAGNOSIS — I4891 Unspecified atrial fibrillation: Secondary | ICD-10-CM | POA: Diagnosis not present

## 2016-05-02 DIAGNOSIS — I739 Peripheral vascular disease, unspecified: Secondary | ICD-10-CM | POA: Diagnosis not present

## 2016-05-02 DIAGNOSIS — Z89511 Acquired absence of right leg below knee: Secondary | ICD-10-CM | POA: Diagnosis not present

## 2016-05-02 DIAGNOSIS — E1140 Type 2 diabetes mellitus with diabetic neuropathy, unspecified: Secondary | ICD-10-CM | POA: Diagnosis not present

## 2016-05-18 DIAGNOSIS — E039 Hypothyroidism, unspecified: Secondary | ICD-10-CM | POA: Diagnosis not present

## 2016-05-18 DIAGNOSIS — D649 Anemia, unspecified: Secondary | ICD-10-CM | POA: Diagnosis not present

## 2016-05-18 DIAGNOSIS — E119 Type 2 diabetes mellitus without complications: Secondary | ICD-10-CM | POA: Diagnosis not present

## 2016-05-18 DIAGNOSIS — E785 Hyperlipidemia, unspecified: Secondary | ICD-10-CM | POA: Diagnosis not present

## 2016-05-18 DIAGNOSIS — E559 Vitamin D deficiency, unspecified: Secondary | ICD-10-CM | POA: Diagnosis not present

## 2016-05-18 DIAGNOSIS — K769 Liver disease, unspecified: Secondary | ICD-10-CM | POA: Diagnosis not present

## 2016-05-18 DIAGNOSIS — I10 Essential (primary) hypertension: Secondary | ICD-10-CM | POA: Diagnosis not present

## 2016-05-20 DIAGNOSIS — I739 Peripheral vascular disease, unspecified: Secondary | ICD-10-CM | POA: Diagnosis not present

## 2016-05-20 DIAGNOSIS — B351 Tinea unguium: Secondary | ICD-10-CM | POA: Diagnosis not present

## 2016-05-20 DIAGNOSIS — E1159 Type 2 diabetes mellitus with other circulatory complications: Secondary | ICD-10-CM | POA: Diagnosis not present

## 2016-05-20 DIAGNOSIS — I509 Heart failure, unspecified: Secondary | ICD-10-CM | POA: Diagnosis not present

## 2016-05-20 DIAGNOSIS — Z89511 Acquired absence of right leg below knee: Secondary | ICD-10-CM | POA: Diagnosis not present

## 2016-05-20 DIAGNOSIS — I10 Essential (primary) hypertension: Secondary | ICD-10-CM | POA: Diagnosis not present

## 2016-05-20 DIAGNOSIS — R05 Cough: Secondary | ICD-10-CM | POA: Diagnosis not present

## 2016-05-20 DIAGNOSIS — I482 Chronic atrial fibrillation: Secondary | ICD-10-CM | POA: Diagnosis not present

## 2016-05-31 DIAGNOSIS — F0391 Unspecified dementia with behavioral disturbance: Secondary | ICD-10-CM | POA: Diagnosis not present

## 2016-05-31 DIAGNOSIS — I2510 Atherosclerotic heart disease of native coronary artery without angina pectoris: Secondary | ICD-10-CM | POA: Diagnosis not present

## 2016-05-31 DIAGNOSIS — E1140 Type 2 diabetes mellitus with diabetic neuropathy, unspecified: Secondary | ICD-10-CM | POA: Diagnosis not present

## 2016-05-31 DIAGNOSIS — I509 Heart failure, unspecified: Secondary | ICD-10-CM | POA: Diagnosis not present

## 2016-05-31 DIAGNOSIS — I482 Chronic atrial fibrillation: Secondary | ICD-10-CM | POA: Diagnosis not present

## 2016-05-31 DIAGNOSIS — Z89511 Acquired absence of right leg below knee: Secondary | ICD-10-CM | POA: Diagnosis not present

## 2016-05-31 DIAGNOSIS — I739 Peripheral vascular disease, unspecified: Secondary | ICD-10-CM | POA: Diagnosis not present

## 2016-05-31 DIAGNOSIS — I10 Essential (primary) hypertension: Secondary | ICD-10-CM | POA: Diagnosis not present

## 2016-06-14 ENCOUNTER — Encounter (HOSPITAL_COMMUNITY): Payer: Self-pay

## 2016-06-14 ENCOUNTER — Emergency Department (HOSPITAL_COMMUNITY)
Admission: EM | Admit: 2016-06-14 | Discharge: 2016-06-14 | Disposition: A | Discharge status: Home / Self Care | Payer: Medicare Other | Attending: Emergency Medicine | Admitting: Emergency Medicine

## 2016-06-14 ENCOUNTER — Emergency Department (HOSPITAL_COMMUNITY): Payer: Medicare Other

## 2016-06-14 DIAGNOSIS — Z7901 Long term (current) use of anticoagulants: Secondary | ICD-10-CM | POA: Diagnosis not present

## 2016-06-14 DIAGNOSIS — E1151 Type 2 diabetes mellitus with diabetic peripheral angiopathy without gangrene: Secondary | ICD-10-CM | POA: Diagnosis not present

## 2016-06-14 DIAGNOSIS — I110 Hypertensive heart disease with heart failure: Secondary | ICD-10-CM | POA: Insufficient documentation

## 2016-06-14 DIAGNOSIS — R2689 Other abnormalities of gait and mobility: Secondary | ICD-10-CM | POA: Diagnosis not present

## 2016-06-14 DIAGNOSIS — S0990XA Unspecified injury of head, initial encounter: Secondary | ICD-10-CM | POA: Diagnosis present

## 2016-06-14 DIAGNOSIS — Z951 Presence of aortocoronary bypass graft: Secondary | ICD-10-CM | POA: Diagnosis not present

## 2016-06-14 DIAGNOSIS — Y92129 Unspecified place in nursing home as the place of occurrence of the external cause: Secondary | ICD-10-CM | POA: Insufficient documentation

## 2016-06-14 DIAGNOSIS — Y9389 Activity, other specified: Secondary | ICD-10-CM | POA: Insufficient documentation

## 2016-06-14 DIAGNOSIS — Y999 Unspecified external cause status: Secondary | ICD-10-CM | POA: Insufficient documentation

## 2016-06-14 DIAGNOSIS — Z8673 Personal history of transient ischemic attack (TIA), and cerebral infarction without residual deficits: Secondary | ICD-10-CM | POA: Diagnosis not present

## 2016-06-14 DIAGNOSIS — S0993XA Unspecified injury of face, initial encounter: Secondary | ICD-10-CM | POA: Diagnosis not present

## 2016-06-14 DIAGNOSIS — W19XXXA Unspecified fall, initial encounter: Secondary | ICD-10-CM

## 2016-06-14 DIAGNOSIS — I482 Chronic atrial fibrillation: Secondary | ICD-10-CM | POA: Diagnosis not present

## 2016-06-14 DIAGNOSIS — I2510 Atherosclerotic heart disease of native coronary artery without angina pectoris: Secondary | ICD-10-CM | POA: Insufficient documentation

## 2016-06-14 DIAGNOSIS — R7989 Other specified abnormal findings of blood chemistry: Secondary | ICD-10-CM | POA: Insufficient documentation

## 2016-06-14 DIAGNOSIS — Z7982 Long term (current) use of aspirin: Secondary | ICD-10-CM | POA: Diagnosis not present

## 2016-06-14 DIAGNOSIS — Z79899 Other long term (current) drug therapy: Secondary | ICD-10-CM | POA: Diagnosis not present

## 2016-06-14 DIAGNOSIS — S199XXA Unspecified injury of neck, initial encounter: Secondary | ICD-10-CM | POA: Diagnosis not present

## 2016-06-14 DIAGNOSIS — Z950 Presence of cardiac pacemaker: Secondary | ICD-10-CM | POA: Insufficient documentation

## 2016-06-14 DIAGNOSIS — S098XXA Other specified injuries of head, initial encounter: Secondary | ICD-10-CM | POA: Diagnosis not present

## 2016-06-14 DIAGNOSIS — Z7984 Long term (current) use of oral hypoglycemic drugs: Secondary | ICD-10-CM | POA: Diagnosis not present

## 2016-06-14 DIAGNOSIS — Z23 Encounter for immunization: Secondary | ICD-10-CM | POA: Diagnosis not present

## 2016-06-14 DIAGNOSIS — S0101XA Laceration without foreign body of scalp, initial encounter: Secondary | ICD-10-CM | POA: Insufficient documentation

## 2016-06-14 DIAGNOSIS — S0083XA Contusion of other part of head, initial encounter: Secondary | ICD-10-CM | POA: Diagnosis not present

## 2016-06-14 DIAGNOSIS — R259 Unspecified abnormal involuntary movements: Secondary | ICD-10-CM | POA: Diagnosis not present

## 2016-06-14 DIAGNOSIS — S0181XA Laceration without foreign body of other part of head, initial encounter: Secondary | ICD-10-CM | POA: Diagnosis not present

## 2016-06-14 DIAGNOSIS — W1839XA Other fall on same level, initial encounter: Secondary | ICD-10-CM | POA: Insufficient documentation

## 2016-06-14 DIAGNOSIS — I5042 Chronic combined systolic (congestive) and diastolic (congestive) heart failure: Secondary | ICD-10-CM | POA: Insufficient documentation

## 2016-06-14 DIAGNOSIS — I679 Cerebrovascular disease, unspecified: Secondary | ICD-10-CM | POA: Diagnosis not present

## 2016-06-14 DIAGNOSIS — W19XXXD Unspecified fall, subsequent encounter: Secondary | ICD-10-CM | POA: Diagnosis not present

## 2016-06-14 DIAGNOSIS — F0390 Unspecified dementia without behavioral disturbance: Secondary | ICD-10-CM | POA: Diagnosis not present

## 2016-06-14 DIAGNOSIS — R260 Ataxic gait: Secondary | ICD-10-CM | POA: Diagnosis not present

## 2016-06-14 DIAGNOSIS — E119 Type 2 diabetes mellitus without complications: Secondary | ICD-10-CM | POA: Diagnosis not present

## 2016-06-14 LAB — CBC
HCT: 34.5 % — ABNORMAL LOW (ref 36.0–46.0)
Hemoglobin: 10.9 g/dL — ABNORMAL LOW (ref 12.0–15.0)
MCH: 30.9 pg (ref 26.0–34.0)
MCHC: 31.6 g/dL (ref 30.0–36.0)
MCV: 97.7 fL (ref 78.0–100.0)
PLATELETS: 130 10*3/uL — AB (ref 150–400)
RBC: 3.53 MIL/uL — AB (ref 3.87–5.11)
RDW: 14.4 % (ref 11.5–15.5)
WBC: 7.7 10*3/uL (ref 4.0–10.5)

## 2016-06-14 LAB — BASIC METABOLIC PANEL
Anion gap: 6 (ref 5–15)
BUN: 29 mg/dL — AB (ref 6–20)
CO2: 31 mmol/L (ref 22–32)
CREATININE: 1.19 mg/dL — AB (ref 0.44–1.00)
Calcium: 8.9 mg/dL (ref 8.9–10.3)
Chloride: 104 mmol/L (ref 101–111)
GFR calc non Af Amer: 38 mL/min — ABNORMAL LOW (ref >60)
GFR, EST AFRICAN AMERICAN: 44 mL/min — AB (ref >60)
Glucose, Bld: 139 mg/dL — ABNORMAL HIGH (ref 65–99)
Potassium: 4 mmol/L (ref 3.5–5.1)
Sodium: 141 mmol/L (ref 135–145)

## 2016-06-14 LAB — PROTIME-INR
INR: 1.17
Prothrombin Time: 15 seconds (ref 11.4–15.2)

## 2016-06-14 MED ORDER — FENTANYL CITRATE (PF) 100 MCG/2ML IJ SOLN: 50.0000 ug | Freq: Once | INTRAMUSCULAR | Status: DC

## 2016-06-14 MED ORDER — TETANUS-DIPHTH-ACELL PERTUSSIS 5-2.5-18.5 LF-MCG/0.5 IM SUSP
0.5000 mL | Freq: Once | INTRAMUSCULAR | Status: AC
  Administered 2016-06-14: 0.5 mL via INTRAMUSCULAR
  Filled 2016-06-14: qty 0.5

## 2016-06-14 MED ORDER — SODIUM CHLORIDE 0.9 % IV BOLUS (SEPSIS)
500.0000 mL | Freq: Once | INTRAVENOUS | Status: AC
  Administered 2016-06-14: 500 mL via INTRAVENOUS

## 2016-06-14 MED ORDER — LIDOCAINE-EPINEPHRINE (PF) 2 %-1:200000 IJ SOLN
10.0000 mL | Freq: Once | INTRAMUSCULAR | Status: AC
  Administered 2016-06-14: 10 mL
  Filled 2016-06-14: qty 20

## 2016-06-14 NOTE — Discharge Instructions (Signed)
Double your coumadin over the next 24hours and then return to your usual dose and have it rechecked Neosporin twice a day to your stiches for 10 days

## 2016-06-14 NOTE — ED Provider Notes (Signed)
[facility] DEPT Provider Note   CSN: [id number] Arrival date & time: 06/14/16  1447     History   Chief Complaint Chief Complaint  Patient presents with  . Fall    HPI Judith Zuniga is a 83 y.o. female.  HPI  Pt is from [place] home, brought here for unwitnessed fall today. Staff at the nursing home reported that pts daughter had just walked out of her room and then they heard the patient fall to the ground. Loss of consciousness unknown but pt was alert when staff entered pts room. Per daughter, pt frequently falls asleep on chair and then slips off so that is what she thinks she has. Happened. Pt has hematoma and laceration to forehead and EMS reports there was blood on the door knob which indicates patient may have hit her head on the door knob when she fell. + Blood thinners And is taking Coumadin. Per daughter, patient has fallen multiple times over the last few months but takes Coumadin for history of stroke per review of records  Pt has dementia and does not remember what happened but EMS reports pt is at her baseline mentation. c-collar placed on patient upon arrival.  History further limited due to patient's mental status. She does complain of some mild headache where she has a hemostatic wound  Past Medical History:  Diagnosis Date  . Atrioventricular block, complete s/p AV ablation   . CAD (coronary artery disease)    s/p redo bypass surgery in 1998 with patent graft in Jan. 2009.   Marland Kitchen Dementia   . Diabetes mellitus   . Elevated liver function tests    on amiodarone  . Hyperlipidemia   . Hypertension   . Ischemic cardiomyopathy    ischemic heart myopathy , ejection fraction of 25%  . Pacemaker -CRT- [employer]   . Permanent atrial fibrillation   . Premature ventricular contractions    frequent  . Systolic heart failure    class II and euvolemic  . Type II or unspecified type diabetes mellitus without mention of complication, not stated as  uncontrolled     Patient Active Problem List   Diagnosis Date Noted  . Stroke ([place])   . HLD (hyperlipidemia)   . Delirium 05/05/2014  . CVA (cerebral infarction) 05/05/2014  . Ischemic cardiomyopathy 05/05/2014  . Chronic atrial fibrillation ([place]) 05/05/2014  . Diabetes mellitus 05/05/2014  . Systolic heart failure 0000000  . Chronic systolic heart failure ([place])   . DM (diabetes mellitus) type II uncontrolled, periph vascular disorder ([place])   . Orthostatic hypotension 07/28/2013  . Encounter for therapeutic drug monitoring 07/13/2013  . Multiple pulmonary nodules 12/13/2011  . Atherosclerosis of native arteries of the extremities with ulceration ([place]) 07/09/2011  . Syncope 05/01/2011  . Diastolic CHF, chronic ([place]) 10/08/2010  . Mitral regurgitation 10/08/2010  . Long term current use of anticoagulant 05/16/2010  . ATAXIA 11/14/2009  . CAROTID ARTERY DISEASE 10/17/2009  . AODM 04/09/2008  . Hyperlipidemia 04/09/2008  . Benign hypertensive heart disease with heart failure ([place]) 04/09/2008  . CAD, ARTERY BYPASS GRAFT 04/09/2008  . Atrial fibrillation ([place]) 04/09/2008  . PACEMAKER, CRT-[employer] 04/09/2008    Past Surgical History:  Procedure Laterality Date  . AMPUTATION Right 05/11/2014   Procedure: AMPUTATION BELOW KNEE;  Surgeon: Newt Minion, MD;  Location: [facility];  Service: Orthopedics;  Laterality: Right;  . cardiac caths     multile  . [place]  CABG x5: RIMA to diagonal one, RIMA to diagonal 2, SVG to PD, SVG to PL, SVG to circ  . CORONARY ARTERY BYPASS GRAFT  1985   x3: LIMA to LAD, SVG to PD and SVG to PL  . pacer generator change out     [employer] pacemaker    OB History    No data available       Home Medications    Prior to Admission medications   Medication Sig Start Date End Date Taking? Authorizing Provider  aspirin EC 81 MG EC tablet Take 1 tablet (81 mg total) by mouth daily. 05/15/14   Costin Karlyne Greenspan, MD    atorvastatin (LIPITOR) 40 MG tablet Take 40 mg by mouth daily.    Historical Provider, MD  bisoprolol (ZEBETA) 10 MG tablet Take 1 tablet (10 mg total) by mouth daily. 12/15/13   Larey Dresser, MD  Cholecalciferol (VITAMIN D-3) 1000 UNITS CAPS Take 2 capsules by mouth daily.    Historical Provider, MD  enoxaparin (LOVENOX) 60 MG/0.6ML injection Inject 0.55 mLs (55 mg total) into the skin every 12 (twelve) hours. 05/15/14   Costin Karlyne Greenspan, MD  ferrous sulfate 325 (65 FE) MG tablet Take 325 mg by mouth 2 (two) times daily with a meal.    Historical Provider, MD  furosemide (LASIX) 40 MG tablet TAKE1/2 TABLET BY MOUTH EVERY DAY 02/16/14   Larey Dresser, MD  glimepiride (AMARYL) 1 MG tablet Take 1 mg by mouth daily with breakfast.  06/12/13   Historical Provider, MD  HYDROcodone-acetaminophen (NORCO/VICODIN) 5-325 MG per tablet Take 1 tablet by mouth every 6 (six) hours as needed for moderate pain. 05/15/14   Costin Karlyne Greenspan, MD  losartan (COZAAR) 50 MG tablet Take 50 mg by mouth daily.  08/23/11   Larey Dresser, MD  Memantine HCl ER (NAMENDA XR) 28 MG CP24 Take 1 capsule by mouth daily.    Historical Provider, MD  nitroGLYCERIN (NITRODUR - DOSED IN MG/24 HR) 0.2 mg/hr patch Place 0.2 mg onto the skin daily.    Historical Provider, MD  pentoxifylline (TRENTAL) 400 MG CR tablet Take 400 mg by mouth 3 (three) times daily with meals.    Historical Provider, MD  potassium chloride (K-DUR) 10 MEQ tablet TAKE ONE TABLET BY MOUTH ONCE DAILY 01/06/14   Larey Dresser, MD  sertraline (ZOLOFT) 50 MG tablet Take 50 mg by mouth daily.    Historical Provider, MD  warfarin (COUMADIN) 5 MG tablet Take 1 tablet (5 mg total) by mouth as directed. Patient taking differently: Take 5-7.5 mg by mouth daily. 5mg  daily except for on Wednesday take 7.5mg  02/16/14   Larey Dresser, MD    Family History Family History  Problem Relation Age of Onset  . Other Mother     pacemaker  . Heart disease Father   . Cancer  Sister     gallbladder    Social History Social History  Substance Use Topics  . Smoking status: Never Smoker  . Smokeless tobacco: Never Used  . Alcohol use No     Allergies   Patient has no known allergies.   Review of Systems Review of Systems  Unable to perform ROS: Dementia  Respiratory: Negative for shortness of breath.   Cardiovascular: Negative for chest pain.  Neurological: Negative for weakness and numbness.     Physical Exam Updated Vital Signs BP 126/74   Pulse 75   Temp 97.9 F (36.6 C) (Oral)  Resp 16   SpO2 98%   Physical Exam  Constitutional: She appears well-developed and well-nourished. No distress.  HENT:  Head: Normocephalic.  5cm laceration of scalp extending to forehead Deep to skull without vessel injury, gross contamination  Eyes: Conjunctivae are normal.  Neck: Neck supple.  Cardiovascular: Normal rate and regular rhythm.   Pulmonary/Chest: Effort normal and breath sounds normal. No respiratory distress.  Abdominal: Soft. There is no tenderness.  Musculoskeletal: She exhibits no edema.  Palpated face, c/t/l spine, chest, clavicles, extremitiesx4, abdomen/pelvis wo significant tenderness or swelling except at wound site  Neurological: She is alert.  Oriented to person only  Skin: Skin is warm and dry.  Psychiatric: She has a normal mood and affect.  Nursing note and vitals reviewed.    ED Treatments / Results  Labs (all labs ordered are listed, but only abnormal results are displayed) Labs Reviewed  BASIC METABOLIC PANEL - Abnormal; Notable for the following:       Result Value   Glucose, Bld 139 (*)    BUN 29 (*)    Creatinine, Ser 1.19 (*)    GFR calc non Af Amer 38 (*)    GFR calc Af Amer 44 (*)    All other components within normal limits  CBC - Abnormal; Notable for the following:    RBC 3.53 (*)    Hemoglobin 10.9 (*)    HCT 34.5 (*)    Platelets 130 (*)    All other components within normal limits  PROTIME-INR     EKG  EKG Interpretation None       Radiology Ct Head Wo Contrast  Result Date: 06/14/2016 CLINICAL DATA:  Patient has dementia and fell. Loss of consciousness. Hematoma and laceration of the forehead. EXAM: CT HEAD WITHOUT CONTRAST CT CERVICAL SPINE WITHOUT CONTRAST TECHNIQUE: Multidetector CT imaging of the head and cervical spine was performed following the standard protocol without intravenous contrast. Multiplanar CT image reconstructions of the cervical spine were also generated. COMPARISON:  05/05/2014 CT head FINDINGS: CT HEAD FINDINGS Brain: Superficial and central atrophy appears stable. Chronic periventricular and subcortical white matter hypodensities consistent with small vessel ischemia. No acute large vascular territory infarction, hemorrhage midline shift. No intra-axial mass nor extra-axial fluid collections. No effacement of the basal cisterns. The fourth ventricle is midline. Vascular: No hyperdense vessel or unexpected calcification. Calcifications of the carotid siphons and vertebral arteries bilaterally. Skull: No fracture. Mastoids are clear bilaterally. The visualized zygomatic arches, temporomandibular joints, orbits and nasal bone appear intact. Sinuses/Orbits: Mild mucosal thickening of the ethmoid sinus. Other: Forehead soft tissue swelling. CT CERVICAL SPINE FINDINGS Alignment: Mild degenerative retrolisthesis of C4 and C5. Skull base and vertebrae: Calcification of the transverse ligament about the odontoid process. There is osteoarthritic joint space narrowing of the atlantodental interval. The C1-C2 relationship appear intact. The craniocervical relationship is maintained bilaterally. Soft tissues and spinal canal: No prevertebral fluid or swelling. No visible canal hematoma. Dense extracranial carotid arteriosclerosis bilaterally involving both common carotid arteries and carotid bulbs. Disc levels: There is degenerative disc disease from C3 through C6 most pronounced  at C4-5 and C5-6 with small posterior marginal osteophytes. Right-sided vertebral artery ectasia at C5 causing adjacent erosive change. Bilateral degenerative facet arthropathy with sclerosis from C3 through T2 with partial ankylosis of the facets at C4-5 on the left. Upper chest: Nonacute. Median sternotomy sutures are partially imaged. Partially imaged pacemaker leads. Other: None IMPRESSION: 1. Mild forehead soft tissue swelling without acute skull fracture. 2. Chronic  small vessel ischemic disease in cerebral atrophy. 3. No acute intracranial abnormality. 4. Degenerative disc disease C3 through C6 with mild grade 1 degenerative retrolisthesis of C4 and C5. Associated multilevel bilateral facet arthropathy. 5. No acute cervical spine fracture or traumatic subluxation. Electronically Signed   By: Ashley Royalty M.D.   On: 06/14/2016 16:30   Ct Cervical Spine Wo Contrast  Result Date: 06/14/2016 CLINICAL DATA:  Patient has dementia and fell. Loss of consciousness. Hematoma and laceration of the forehead. EXAM: CT HEAD WITHOUT CONTRAST CT CERVICAL SPINE WITHOUT CONTRAST TECHNIQUE: Multidetector CT imaging of the head and cervical spine was performed following the standard protocol without intravenous contrast. Multiplanar CT image reconstructions of the cervical spine were also generated. COMPARISON:  05/05/2014 CT head FINDINGS: CT HEAD FINDINGS Brain: Superficial and central atrophy appears stable. Chronic periventricular and subcortical white matter hypodensities consistent with small vessel ischemia. No acute large vascular territory infarction, hemorrhage midline shift. No intra-axial mass nor extra-axial fluid collections. No effacement of the basal cisterns. The fourth ventricle is midline. Vascular: No hyperdense vessel or unexpected calcification. Calcifications of the carotid siphons and vertebral arteries bilaterally. Skull: No fracture. Mastoids are clear bilaterally. The visualized zygomatic arches,  temporomandibular joints, orbits and nasal bone appear intact. Sinuses/Orbits: Mild mucosal thickening of the ethmoid sinus. Other: Forehead soft tissue swelling. CT CERVICAL SPINE FINDINGS Alignment: Mild degenerative retrolisthesis of C4 and C5. Skull base and vertebrae: Calcification of the transverse ligament about the odontoid process. There is osteoarthritic joint space narrowing of the atlantodental interval. The C1-C2 relationship appear intact. The craniocervical relationship is maintained bilaterally. Soft tissues and spinal canal: No prevertebral fluid or swelling. No visible canal hematoma. Dense extracranial carotid arteriosclerosis bilaterally involving both common carotid arteries and carotid bulbs. Disc levels: There is degenerative disc disease from C3 through C6 most pronounced at C4-5 and C5-6 with small posterior marginal osteophytes. Right-sided vertebral artery ectasia at C5 causing adjacent erosive change. Bilateral degenerative facet arthropathy with sclerosis from C3 through T2 with partial ankylosis of the facets at C4-5 on the left. Upper chest: Nonacute. Median sternotomy sutures are partially imaged. Partially imaged pacemaker leads. Other: None IMPRESSION: 1. Mild forehead soft tissue swelling without acute skull fracture. 2. Chronic small vessel ischemic disease in cerebral atrophy. 3. No acute intracranial abnormality. 4. Degenerative disc disease C3 through C6 with mild grade 1 degenerative retrolisthesis of C4 and C5. Associated multilevel bilateral facet arthropathy. 5. No acute cervical spine fracture or traumatic subluxation. Electronically Signed   By: Ashley Royalty M.D.   On: 06/14/2016 16:30    Procedures Procedures (including critical care time)  LACERATION REPAIR Performed by: Allyson Sabal Authorized by: Allyson Sabal Consent: Verbal consent obtained. Risks and benefits: risks, benefits and alternatives were discussed Consent given by: patient Patient identity  confirmed: provided demographic data Prepped and Draped in normal sterile fashion Wound explored  Laceration Location: face/scalp  Laceration Length: 5cm  No Foreign Bodies seen or palpated  Anesthesia: local infiltration  Local anesthetic: lidocaine 1% with epinephrine  Anesthetic total: 4 ml  Irrigation method: syringe Amount of cleaning: standard  Skin closure: 5-0 prolene  Number of sutures: 5  Technique: simple continuous with simple interrupted  Patient tolerance: Patient tolerated the procedure well with no immediate complications.  Medications Ordered in ED Medications  lidocaine-EPINEPHrine (XYLOCAINE W/EPI) 2 %-1:200000 (PF) injection 10 mL (10 mLs Infiltration Given 06/14/16 1709)  Tdap (BOOSTRIX) injection 0.5 mL (0.5 mLs Intramuscular Given 06/14/16 1701)  sodium chloride 0.9 %  bolus 500 mL (0 mLs Intravenous Stopped 06/14/16 1859)     Initial Impression / Assessment and Plan / ED Course  I have reviewed the triage vital signs and the nursing notes.  Pertinent labs & imaging results that were available during my care of the patient were reviewed by me and considered in my medical decision making (see chart for details).     Likely mechanical fall per hx She denies any chest pain or shortness of breath or palpitations to suggest cardiac etiology EKG, troponin negative for ACS or emergent cause of syncope Her labs to feel a mild creatinine increase Patient advised to follow-up with her primary care provider regarding this She has no significant electrolyte abnormalities and will rehydrate orally at home Her labs do show mild anemia but this is improved from baseline Her INR is subtherapeutic. Discussed with family that she may elect to come off of this medication given her frequent falls but that this can be deferred to a discussion with her primary care provider. In the interim, will double dose tomorrow and have labs checked again on Monday CT head and C-spine  without acute abnormality T depth updated and wound repaired Strict return precautions and follow-up with primary care doctor for removal of stitches in 10 days   Final Clinical Impressions(s) / ED Diagnoses   Final diagnoses:  Laceration of scalp, initial encounter  Fall, initial encounter  Elevated serum creatinine    New Prescriptions Discharge Medication List as of 06/14/2016  6:29 PM       Karma Greaser, MD 06/15/16 [id number]    Davonna Belling, MD 06/15/16 2236

## 2016-06-14 NOTE — ED Notes (Signed)
[facility] contacted for tx back to [facility] 

## 2016-06-14 NOTE — ED Triage Notes (Signed)
PER EMS: Pt is from [facility] home, brought here for unwitnessed fall today. Staff at the nursing home reported that pts daughter had just walked out of her room and then they heard the patient fall to the ground. Loss of consciousness unknown but pt was alert when staff entered pts room. Pt has hematoma and laceration to forehead and EMS reports there was blood on the door knob which indicates patient may have hit her head on the door knob when she fell. + Blood thinners. Pt has dementia and does not remember what happened but EMS reports pt is at her baseline mentation. c-collar placed on patient upon arrival by this RN and Earnest Bailey, Therapist, sports while maintaining stabilization of c-spine.

## 2016-06-17 DIAGNOSIS — I482 Chronic atrial fibrillation: Secondary | ICD-10-CM | POA: Diagnosis not present

## 2016-06-17 DIAGNOSIS — T148XXD Other injury of unspecified body region, subsequent encounter: Secondary | ICD-10-CM | POA: Diagnosis not present

## 2016-06-17 DIAGNOSIS — E119 Type 2 diabetes mellitus without complications: Secondary | ICD-10-CM | POA: Diagnosis not present

## 2016-06-17 DIAGNOSIS — W19XXXD Unspecified fall, subsequent encounter: Secondary | ICD-10-CM | POA: Diagnosis not present

## 2016-06-21 DIAGNOSIS — R278 Other lack of coordination: Secondary | ICD-10-CM | POA: Diagnosis not present

## 2016-06-21 DIAGNOSIS — Z9181 History of falling: Secondary | ICD-10-CM | POA: Diagnosis not present

## 2016-06-21 DIAGNOSIS — M6281 Muscle weakness (generalized): Secondary | ICD-10-CM | POA: Diagnosis not present

## 2016-06-21 DIAGNOSIS — Z89511 Acquired absence of right leg below knee: Secondary | ICD-10-CM | POA: Diagnosis not present

## 2016-06-22 DIAGNOSIS — M6281 Muscle weakness (generalized): Secondary | ICD-10-CM | POA: Diagnosis not present

## 2016-06-22 DIAGNOSIS — Z9181 History of falling: Secondary | ICD-10-CM | POA: Diagnosis not present

## 2016-06-22 DIAGNOSIS — Z89511 Acquired absence of right leg below knee: Secondary | ICD-10-CM | POA: Diagnosis not present

## 2016-06-22 DIAGNOSIS — R278 Other lack of coordination: Secondary | ICD-10-CM | POA: Diagnosis not present

## 2016-06-25 DIAGNOSIS — Z89511 Acquired absence of right leg below knee: Secondary | ICD-10-CM | POA: Diagnosis not present

## 2016-06-25 DIAGNOSIS — Z9181 History of falling: Secondary | ICD-10-CM | POA: Diagnosis not present

## 2016-06-25 DIAGNOSIS — R278 Other lack of coordination: Secondary | ICD-10-CM | POA: Diagnosis not present

## 2016-06-25 DIAGNOSIS — M6281 Muscle weakness (generalized): Secondary | ICD-10-CM | POA: Diagnosis not present

## 2016-06-26 DIAGNOSIS — E119 Type 2 diabetes mellitus without complications: Secondary | ICD-10-CM | POA: Diagnosis not present

## 2016-06-26 DIAGNOSIS — F0390 Unspecified dementia without behavioral disturbance: Secondary | ICD-10-CM | POA: Diagnosis not present

## 2016-06-26 DIAGNOSIS — I482 Chronic atrial fibrillation: Secondary | ICD-10-CM | POA: Diagnosis not present

## 2016-06-26 DIAGNOSIS — M6281 Muscle weakness (generalized): Secondary | ICD-10-CM | POA: Diagnosis not present

## 2016-06-26 DIAGNOSIS — R278 Other lack of coordination: Secondary | ICD-10-CM | POA: Diagnosis not present

## 2016-06-26 DIAGNOSIS — Z89511 Acquired absence of right leg below knee: Secondary | ICD-10-CM | POA: Diagnosis not present

## 2016-06-26 DIAGNOSIS — I10 Essential (primary) hypertension: Secondary | ICD-10-CM | POA: Diagnosis not present

## 2016-06-26 DIAGNOSIS — Z9181 History of falling: Secondary | ICD-10-CM | POA: Diagnosis not present

## 2016-06-27 DIAGNOSIS — Z89511 Acquired absence of right leg below knee: Secondary | ICD-10-CM | POA: Diagnosis not present

## 2016-06-27 DIAGNOSIS — M6281 Muscle weakness (generalized): Secondary | ICD-10-CM | POA: Diagnosis not present

## 2016-06-27 DIAGNOSIS — Z9181 History of falling: Secondary | ICD-10-CM | POA: Diagnosis not present

## 2016-06-27 DIAGNOSIS — R278 Other lack of coordination: Secondary | ICD-10-CM | POA: Diagnosis not present

## 2016-06-28 DIAGNOSIS — I639 Cerebral infarction, unspecified: Secondary | ICD-10-CM | POA: Diagnosis not present

## 2016-06-28 DIAGNOSIS — I509 Heart failure, unspecified: Secondary | ICD-10-CM | POA: Diagnosis not present

## 2016-06-28 DIAGNOSIS — Z89511 Acquired absence of right leg below knee: Secondary | ICD-10-CM | POA: Diagnosis not present

## 2016-06-28 DIAGNOSIS — I10 Essential (primary) hypertension: Secondary | ICD-10-CM | POA: Diagnosis not present

## 2016-06-28 DIAGNOSIS — I739 Peripheral vascular disease, unspecified: Secondary | ICD-10-CM | POA: Diagnosis not present

## 2016-06-28 DIAGNOSIS — I4891 Unspecified atrial fibrillation: Secondary | ICD-10-CM | POA: Diagnosis not present

## 2016-06-28 DIAGNOSIS — R278 Other lack of coordination: Secondary | ICD-10-CM | POA: Diagnosis not present

## 2016-06-28 DIAGNOSIS — Z9181 History of falling: Secondary | ICD-10-CM | POA: Diagnosis not present

## 2016-06-28 DIAGNOSIS — S0083XA Contusion of other part of head, initial encounter: Secondary | ICD-10-CM | POA: Diagnosis not present

## 2016-06-28 DIAGNOSIS — M6281 Muscle weakness (generalized): Secondary | ICD-10-CM | POA: Diagnosis not present

## 2016-06-28 DIAGNOSIS — F0391 Unspecified dementia with behavioral disturbance: Secondary | ICD-10-CM | POA: Diagnosis not present

## 2016-07-01 DIAGNOSIS — Z89511 Acquired absence of right leg below knee: Secondary | ICD-10-CM | POA: Diagnosis not present

## 2016-07-01 DIAGNOSIS — M6281 Muscle weakness (generalized): Secondary | ICD-10-CM | POA: Diagnosis not present

## 2016-07-01 DIAGNOSIS — R278 Other lack of coordination: Secondary | ICD-10-CM | POA: Diagnosis not present

## 2016-07-01 DIAGNOSIS — Z9181 History of falling: Secondary | ICD-10-CM | POA: Diagnosis not present

## 2016-07-02 DIAGNOSIS — Z89511 Acquired absence of right leg below knee: Secondary | ICD-10-CM | POA: Diagnosis not present

## 2016-07-02 DIAGNOSIS — R278 Other lack of coordination: Secondary | ICD-10-CM | POA: Diagnosis not present

## 2016-07-02 DIAGNOSIS — Z9181 History of falling: Secondary | ICD-10-CM | POA: Diagnosis not present

## 2016-07-02 DIAGNOSIS — M6281 Muscle weakness (generalized): Secondary | ICD-10-CM | POA: Diagnosis not present

## 2016-07-03 DIAGNOSIS — R278 Other lack of coordination: Secondary | ICD-10-CM | POA: Diagnosis not present

## 2016-07-03 DIAGNOSIS — Z89511 Acquired absence of right leg below knee: Secondary | ICD-10-CM | POA: Diagnosis not present

## 2016-07-03 DIAGNOSIS — Z9181 History of falling: Secondary | ICD-10-CM | POA: Diagnosis not present

## 2016-07-03 DIAGNOSIS — M6281 Muscle weakness (generalized): Secondary | ICD-10-CM | POA: Diagnosis not present

## 2016-07-04 DIAGNOSIS — I10 Essential (primary) hypertension: Secondary | ICD-10-CM | POA: Diagnosis not present

## 2016-07-04 DIAGNOSIS — Z9181 History of falling: Secondary | ICD-10-CM | POA: Diagnosis not present

## 2016-07-04 DIAGNOSIS — E119 Type 2 diabetes mellitus without complications: Secondary | ICD-10-CM | POA: Diagnosis not present

## 2016-07-04 DIAGNOSIS — F0390 Unspecified dementia without behavioral disturbance: Secondary | ICD-10-CM | POA: Diagnosis not present

## 2016-07-04 DIAGNOSIS — I482 Chronic atrial fibrillation: Secondary | ICD-10-CM | POA: Diagnosis not present

## 2016-07-04 DIAGNOSIS — M6281 Muscle weakness (generalized): Secondary | ICD-10-CM | POA: Diagnosis not present

## 2016-07-04 DIAGNOSIS — Z89511 Acquired absence of right leg below knee: Secondary | ICD-10-CM | POA: Diagnosis not present

## 2016-07-04 DIAGNOSIS — R278 Other lack of coordination: Secondary | ICD-10-CM | POA: Diagnosis not present

## 2016-07-05 DIAGNOSIS — Z89511 Acquired absence of right leg below knee: Secondary | ICD-10-CM | POA: Diagnosis not present

## 2016-07-05 DIAGNOSIS — Z9181 History of falling: Secondary | ICD-10-CM | POA: Diagnosis not present

## 2016-07-05 DIAGNOSIS — R278 Other lack of coordination: Secondary | ICD-10-CM | POA: Diagnosis not present

## 2016-07-05 DIAGNOSIS — M6281 Muscle weakness (generalized): Secondary | ICD-10-CM | POA: Diagnosis not present

## 2016-07-08 DIAGNOSIS — R278 Other lack of coordination: Secondary | ICD-10-CM | POA: Diagnosis not present

## 2016-07-08 DIAGNOSIS — M6281 Muscle weakness (generalized): Secondary | ICD-10-CM | POA: Diagnosis not present

## 2016-07-08 DIAGNOSIS — Z9181 History of falling: Secondary | ICD-10-CM | POA: Diagnosis not present

## 2016-07-08 DIAGNOSIS — Z89511 Acquired absence of right leg below knee: Secondary | ICD-10-CM | POA: Diagnosis not present

## 2016-07-09 DIAGNOSIS — R278 Other lack of coordination: Secondary | ICD-10-CM | POA: Diagnosis not present

## 2016-07-09 DIAGNOSIS — Z89511 Acquired absence of right leg below knee: Secondary | ICD-10-CM | POA: Diagnosis not present

## 2016-07-09 DIAGNOSIS — Z9181 History of falling: Secondary | ICD-10-CM | POA: Diagnosis not present

## 2016-07-09 DIAGNOSIS — M6281 Muscle weakness (generalized): Secondary | ICD-10-CM | POA: Diagnosis not present

## 2016-07-10 DIAGNOSIS — R278 Other lack of coordination: Secondary | ICD-10-CM | POA: Diagnosis not present

## 2016-07-10 DIAGNOSIS — Z89511 Acquired absence of right leg below knee: Secondary | ICD-10-CM | POA: Diagnosis not present

## 2016-07-10 DIAGNOSIS — Z9181 History of falling: Secondary | ICD-10-CM | POA: Diagnosis not present

## 2016-07-10 DIAGNOSIS — M6281 Muscle weakness (generalized): Secondary | ICD-10-CM | POA: Diagnosis not present

## 2016-07-11 DIAGNOSIS — Z89511 Acquired absence of right leg below knee: Secondary | ICD-10-CM | POA: Diagnosis not present

## 2016-07-11 DIAGNOSIS — M6281 Muscle weakness (generalized): Secondary | ICD-10-CM | POA: Diagnosis not present

## 2016-07-11 DIAGNOSIS — R278 Other lack of coordination: Secondary | ICD-10-CM | POA: Diagnosis not present

## 2016-07-11 DIAGNOSIS — Z9181 History of falling: Secondary | ICD-10-CM | POA: Diagnosis not present

## 2016-07-12 DIAGNOSIS — Z89511 Acquired absence of right leg below knee: Secondary | ICD-10-CM | POA: Diagnosis not present

## 2016-07-12 DIAGNOSIS — R278 Other lack of coordination: Secondary | ICD-10-CM | POA: Diagnosis not present

## 2016-07-12 DIAGNOSIS — Z9181 History of falling: Secondary | ICD-10-CM | POA: Diagnosis not present

## 2016-07-12 DIAGNOSIS — M6281 Muscle weakness (generalized): Secondary | ICD-10-CM | POA: Diagnosis not present

## 2016-07-13 DIAGNOSIS — M6281 Muscle weakness (generalized): Secondary | ICD-10-CM | POA: Diagnosis not present

## 2016-07-13 DIAGNOSIS — Z9181 History of falling: Secondary | ICD-10-CM | POA: Diagnosis not present

## 2016-07-13 DIAGNOSIS — R278 Other lack of coordination: Secondary | ICD-10-CM | POA: Diagnosis not present

## 2016-07-13 DIAGNOSIS — Z89511 Acquired absence of right leg below knee: Secondary | ICD-10-CM | POA: Diagnosis not present

## 2016-07-15 DIAGNOSIS — Z89511 Acquired absence of right leg below knee: Secondary | ICD-10-CM | POA: Diagnosis not present

## 2016-07-15 DIAGNOSIS — R278 Other lack of coordination: Secondary | ICD-10-CM | POA: Diagnosis not present

## 2016-07-15 DIAGNOSIS — Z9181 History of falling: Secondary | ICD-10-CM | POA: Diagnosis not present

## 2016-07-16 DIAGNOSIS — Z89511 Acquired absence of right leg below knee: Secondary | ICD-10-CM | POA: Diagnosis not present

## 2016-07-16 DIAGNOSIS — Z9181 History of falling: Secondary | ICD-10-CM | POA: Diagnosis not present

## 2016-07-16 DIAGNOSIS — R278 Other lack of coordination: Secondary | ICD-10-CM | POA: Diagnosis not present

## 2016-07-17 DIAGNOSIS — R278 Other lack of coordination: Secondary | ICD-10-CM | POA: Diagnosis not present

## 2016-07-17 DIAGNOSIS — Z9181 History of falling: Secondary | ICD-10-CM | POA: Diagnosis not present

## 2016-07-17 DIAGNOSIS — Z89511 Acquired absence of right leg below knee: Secondary | ICD-10-CM | POA: Diagnosis not present

## 2016-07-26 ENCOUNTER — Encounter (HOSPITAL_COMMUNITY): Payer: Self-pay | Admitting: Emergency Medicine

## 2016-07-26 ENCOUNTER — Emergency Department (HOSPITAL_COMMUNITY): Payer: Medicare Other

## 2016-07-26 ENCOUNTER — Observation Stay (HOSPITAL_COMMUNITY)
Admission: EM | Admit: 2016-07-26 | Discharge: 2016-07-28 | Disposition: A | Discharge status: Skilled Nursing Facility | Payer: Medicare Other | Attending: Internal Medicine | Admitting: Internal Medicine

## 2016-07-26 DIAGNOSIS — E785 Hyperlipidemia, unspecified: Secondary | ICD-10-CM | POA: Insufficient documentation

## 2016-07-26 DIAGNOSIS — I110 Hypertensive heart disease with heart failure: Secondary | ICD-10-CM | POA: Insufficient documentation

## 2016-07-26 DIAGNOSIS — Z951 Presence of aortocoronary bypass graft: Secondary | ICD-10-CM | POA: Diagnosis not present

## 2016-07-26 DIAGNOSIS — Z7901 Long term (current) use of anticoagulants: Secondary | ICD-10-CM | POA: Insufficient documentation

## 2016-07-26 DIAGNOSIS — I340 Nonrheumatic mitral (valve) insufficiency: Secondary | ICD-10-CM | POA: Insufficient documentation

## 2016-07-26 DIAGNOSIS — Z8673 Personal history of transient ischemic attack (TIA), and cerebral infarction without residual deficits: Secondary | ICD-10-CM

## 2016-07-26 DIAGNOSIS — Z7982 Long term (current) use of aspirin: Secondary | ICD-10-CM | POA: Insufficient documentation

## 2016-07-26 DIAGNOSIS — E1165 Type 2 diabetes mellitus with hyperglycemia: Secondary | ICD-10-CM

## 2016-07-26 DIAGNOSIS — E1151 Type 2 diabetes mellitus with diabetic peripheral angiopathy without gangrene: Secondary | ICD-10-CM | POA: Diagnosis present

## 2016-07-26 DIAGNOSIS — Z950 Presence of cardiac pacemaker: Secondary | ICD-10-CM | POA: Diagnosis not present

## 2016-07-26 DIAGNOSIS — Z79899 Other long term (current) drug therapy: Secondary | ICD-10-CM | POA: Diagnosis not present

## 2016-07-26 DIAGNOSIS — F0390 Unspecified dementia without behavioral disturbance: Secondary | ICD-10-CM | POA: Insufficient documentation

## 2016-07-26 DIAGNOSIS — I255 Ischemic cardiomyopathy: Secondary | ICD-10-CM | POA: Insufficient documentation

## 2016-07-26 DIAGNOSIS — R042 Hemoptysis: Secondary | ICD-10-CM | POA: Diagnosis not present

## 2016-07-26 DIAGNOSIS — I482 Chronic atrial fibrillation: Secondary | ICD-10-CM | POA: Diagnosis not present

## 2016-07-26 DIAGNOSIS — I2510 Atherosclerotic heart disease of native coronary artery without angina pectoris: Secondary | ICD-10-CM | POA: Diagnosis not present

## 2016-07-26 DIAGNOSIS — I442 Atrioventricular block, complete: Secondary | ICD-10-CM | POA: Insufficient documentation

## 2016-07-26 DIAGNOSIS — R402441 Other coma, without documented Glasgow coma scale score, or with partial score reported, in the field [EMT or ambulance]: Secondary | ICD-10-CM | POA: Diagnosis not present

## 2016-07-26 DIAGNOSIS — I25810 Atherosclerosis of coronary artery bypass graft(s) without angina pectoris: Secondary | ICD-10-CM | POA: Diagnosis present

## 2016-07-26 DIAGNOSIS — I5042 Chronic combined systolic (congestive) and diastolic (congestive) heart failure: Secondary | ICD-10-CM | POA: Insufficient documentation

## 2016-07-26 DIAGNOSIS — D696 Thrombocytopenia, unspecified: Secondary | ICD-10-CM | POA: Insufficient documentation

## 2016-07-26 DIAGNOSIS — K920 Hematemesis: Secondary | ICD-10-CM | POA: Diagnosis not present

## 2016-07-26 DIAGNOSIS — Z89511 Acquired absence of right leg below knee: Secondary | ICD-10-CM | POA: Insufficient documentation

## 2016-07-26 DIAGNOSIS — IMO0002 Reserved for concepts with insufficient information to code with codable children: Secondary | ICD-10-CM | POA: Diagnosis present

## 2016-07-26 DIAGNOSIS — I4891 Unspecified atrial fibrillation: Secondary | ICD-10-CM | POA: Diagnosis present

## 2016-07-26 DIAGNOSIS — D649 Anemia, unspecified: Secondary | ICD-10-CM | POA: Insufficient documentation

## 2016-07-26 DIAGNOSIS — I5033 Acute on chronic diastolic (congestive) heart failure: Secondary | ICD-10-CM | POA: Diagnosis present

## 2016-07-26 LAB — CBC WITH DIFFERENTIAL/PLATELET
BASOS PCT: 0 %
Basophils Absolute: 0 10*3/uL (ref 0.0–0.1)
Eosinophils Absolute: 0.1 10*3/uL (ref 0.0–0.7)
Eosinophils Relative: 1 %
HEMATOCRIT: 34.7 % — AB (ref 36.0–46.0)
HEMOGLOBIN: 11.3 g/dL — AB (ref 12.0–15.0)
LYMPHS ABS: 2.2 10*3/uL (ref 0.7–4.0)
LYMPHS PCT: 28 %
MCH: 31.7 pg (ref 26.0–34.0)
MCHC: 32.6 g/dL (ref 30.0–36.0)
MCV: 97.5 fL (ref 78.0–100.0)
MONOS PCT: 7 %
Monocytes Absolute: 0.6 10*3/uL (ref 0.1–1.0)
NEUTROS ABS: 4.9 10*3/uL (ref 1.7–7.7)
NEUTROS PCT: 64 %
Platelets: 137 10*3/uL — ABNORMAL LOW (ref 150–400)
RBC: 3.56 MIL/uL — ABNORMAL LOW (ref 3.87–5.11)
RDW: 14.6 % (ref 11.5–15.5)
WBC: 7.7 10*3/uL (ref 4.0–10.5)

## 2016-07-26 LAB — PROTIME-INR
INR: 1.22
Prothrombin Time: 15.5 seconds — ABNORMAL HIGH (ref 11.4–15.2)

## 2016-07-26 MED ORDER — PANTOPRAZOLE SODIUM 40 MG IV SOLR
40.0000 mg | Freq: Once | INTRAVENOUS | Status: AC
  Administered 2016-07-26: 40 mg via INTRAVENOUS
  Filled 2016-07-26: qty 40

## 2016-07-26 NOTE — ED Provider Notes (Signed)
[facility] DEPT Provider Note   CSN: [id number] Arrival date & time: 07/26/16  2218   By signing my name below, I, Soijett Blue, attest that this documentation has been prepared under the direction and in the presence of Sherwood Gambler, MD. Electronically Signed: Soijett Blue, ED Scribe. 07/26/16. 11:08 PM.  History   Chief Complaint Chief Complaint  Patient presents with  . Hematemesis     LEVEL 5 CAVEAT: DEMENTIA  HPI Judith Zuniga is a 83 y.o. female with a PMHx of Dementia, DM, HTN, who presents to the Emergency Department brought in by EMS from [facility] and [place] complaining of "bleeding through her mouth" onset PTA. EMS is not present on arrival. No current bleeding. Patient has no complaints, currently disoriented to situation, place and time. Pt was not given any medications for the relief of her symptoms. EMS denies the pt having any other symptoms.  She is on Eliquis for A-fib  After arrival I called Ritta Slot and her nurse states that around 9 pm the patient was sitting in the common hall and started covering her mouth with a newspaper. Had bleeding from mouth and nose. Was swallowing it. It did not seem like she was vomiting, no obvious coughing.    The history is provided by the EMS personnel. No language interpreter was used.    Past Medical History:  Diagnosis Date  . Atrioventricular block, complete s/p AV ablation   . CAD (coronary artery disease)    s/p redo bypass surgery in 1998 with patent graft in Jan. 2009.   Marland Kitchen Dementia   . Diabetes mellitus   . Elevated liver function tests    on amiodarone  . Hyperlipidemia   . Hypertension   . Ischemic cardiomyopathy    ischemic heart myopathy , ejection fraction of 25%  . Pacemaker -CRT- [employer]   . Permanent atrial fibrillation   . Premature ventricular contractions    frequent  . Systolic heart failure    class II and euvolemic  . Type II or unspecified type diabetes mellitus  without mention of complication, not stated as uncontrolled     Patient Active Problem List   Diagnosis Date Noted  . Hematemesis 07/27/2016  . Stroke ([place])   . HLD (hyperlipidemia)   . Delirium 05/05/2014  . History of stroke 05/05/2014  . Ischemic cardiomyopathy 05/05/2014  . Chronic atrial fibrillation ([place]) 05/05/2014  . Diabetes mellitus 05/05/2014  . Systolic heart failure 70/26/3785  . Chronic systolic heart failure ([place])   . DM (diabetes mellitus) type II uncontrolled, periph vascular disorder ([place])   . Orthostatic hypotension 07/28/2013  . Encounter for therapeutic drug monitoring 07/13/2013  . Multiple pulmonary nodules 12/13/2011  . Atherosclerosis of native arteries of the extremities with ulceration ([place]) 07/09/2011  . Syncope 05/01/2011  . Diastolic CHF, chronic ([place]) 10/08/2010  . Mitral regurgitation 10/08/2010  . Long term current use of anticoagulant 05/16/2010  . ATAXIA 11/14/2009  . CAROTID ARTERY DISEASE 10/17/2009  . AODM 04/09/2008  . Hyperlipidemia 04/09/2008  . Benign hypertensive heart disease with heart failure ([place]) 04/09/2008  . CAD, ARTERY BYPASS GRAFT 04/09/2008  . Atrial fibrillation ([place]) 04/09/2008  . PACEMAKER, CRT-[employer] 04/09/2008    Past Surgical History:  Procedure Laterality Date  . AMPUTATION Right 05/11/2014   Procedure: AMPUTATION BELOW KNEE;  Surgeon: Newt Minion, MD;  Location: [facility];  Service: Orthopedics;  Laterality: Right;  . cardiac caths     multile  . CORONARY ARTERY  BYPASS GRAFT  1998   CABG x5: RIMA to diagonal one, RIMA to diagonal 2, SVG to PD, SVG to PL, SVG to circ  . CORONARY ARTERY BYPASS GRAFT  1985   x3: LIMA to LAD, SVG to PD and SVG to PL  . pacer generator change out     [employer] pacemaker    OB History    No data available       Home Medications    Prior to Admission medications   Medication Sig Start Date End Date Taking? Authorizing Provider  aspirin EC 81 MG EC tablet Take 1 tablet (81 mg  total) by mouth daily. 05/15/14   Costin Karlyne Greenspan, MD  atorvastatin (LIPITOR) 40 MG tablet Take 40 mg by mouth daily.    Historical Provider, MD  bisoprolol (ZEBETA) 10 MG tablet Take 1 tablet (10 mg total) by mouth daily. 12/15/13   Larey Dresser, MD  Cholecalciferol (VITAMIN D-3) 1000 UNITS CAPS Take 2 capsules by mouth daily.    Historical Provider, MD  enoxaparin (LOVENOX) 60 MG/0.6ML injection Inject 0.55 mLs (55 mg total) into the skin every 12 (twelve) hours. 05/15/14   Costin Karlyne Greenspan, MD  ferrous sulfate 325 (65 FE) MG tablet Take 325 mg by mouth 2 (two) times daily with a meal.    Historical Provider, MD  furosemide (LASIX) 40 MG tablet TAKE1/2 TABLET BY MOUTH EVERY DAY 02/16/14   Larey Dresser, MD  glimepiride (AMARYL) 1 MG tablet Take 1 mg by mouth daily with breakfast.  06/12/13   Historical Provider, MD  HYDROcodone-acetaminophen (NORCO/VICODIN) 5-325 MG per tablet Take 1 tablet by mouth every 6 (six) hours as needed for moderate pain. 05/15/14   Costin Karlyne Greenspan, MD  losartan (COZAAR) 50 MG tablet Take 50 mg by mouth daily.  08/23/11   Larey Dresser, MD  Memantine HCl ER (NAMENDA XR) 28 MG CP24 Take 1 capsule by mouth daily.    Historical Provider, MD  nitroGLYCERIN (NITRODUR - DOSED IN MG/24 HR) 0.2 mg/hr patch Place 0.2 mg onto the skin daily.    Historical Provider, MD  pentoxifylline (TRENTAL) 400 MG CR tablet Take 400 mg by mouth 3 (three) times daily with meals.    Historical Provider, MD  potassium chloride (K-DUR) 10 MEQ tablet TAKE ONE TABLET BY MOUTH ONCE DAILY 01/06/14   Larey Dresser, MD  sertraline (ZOLOFT) 50 MG tablet Take 50 mg by mouth daily.    Historical Provider, MD  warfarin (COUMADIN) 5 MG tablet Take 1 tablet (5 mg total) by mouth as directed. Patient taking differently: Take 5-7.5 mg by mouth daily. 5mg  daily except for on Wednesday take 7.5mg  02/16/14   Larey Dresser, MD    Family History Family History  Problem Relation Age of Onset  . Other Mother      pacemaker  . Heart disease Father   . Cancer Sister     gallbladder    Social History Social History  Substance Use Topics  . Smoking status: Never Smoker  . Smokeless tobacco: Never Used  . Alcohol use No     Allergies   Patient has no known allergies.   Review of Systems Review of Systems  Unable to perform ROS: Dementia     Physical Exam Updated Vital Signs BP (!) 100/50 (BP Location: Left Arm)   Pulse 76   Temp 98.2 F (36.8 C) (Oral)   Resp 16   Ht 5\' 5"  (1.651 m)  Wt 101 lb 11.2 oz (46.1 kg)   SpO2 98%   BMI 16.92 kg/m   Physical Exam  Constitutional: She is oriented to person, place, and time. She appears well-developed and well-nourished.  HENT:  Head: Normocephalic and atraumatic.  Right Ear: External ear normal.  Left Ear: External ear normal.  Nose: Nose normal.  Dried blood on tongue and around lips, otherwise oropharynx clear.   Eyes: Right eye exhibits no discharge. Left eye exhibits no discharge.  Cardiovascular: Normal rate and regular rhythm.   Murmur heard. Pulmonary/Chest: Effort normal and breath sounds normal.  Abdominal: Soft. There is no tenderness.  Musculoskeletal:  Right BKA.   Neurological: She is alert and oriented to person, place, and time.  Skin: Skin is warm and dry.  Nursing note and vitals reviewed.    ED Treatments / Results  DIAGNOSTIC STUDIES: Oxygen Saturation is 95% on RA, adequate by my interpretation.    COORDINATION OF CARE: 11:06 PM Discussed treatment plan with pt at bedside and pt agreed to plan.   Labs (all labs ordered are listed, but only abnormal results are displayed) Labs Reviewed  MRSA PCR SCREENING - Abnormal; Notable for the following:       Result Value   MRSA by PCR POSITIVE (*)    All other components within normal limits  COMPREHENSIVE METABOLIC PANEL - Abnormal; Notable for the following:    Glucose, Bld 129 (*)    Albumin 3.4 (*)    GFR calc non Af Amer 47 (*)    GFR calc Af  Amer 54 (*)    All other components within normal limits  CBC WITH DIFFERENTIAL/PLATELET - Abnormal; Notable for the following:    RBC 3.56 (*)    Hemoglobin 11.3 (*)    HCT 34.7 (*)    Platelets 137 (*)    All other components within normal limits  PROTIME-INR - Abnormal; Notable for the following:    Prothrombin Time 15.5 (*)    All other components within normal limits  CBC  GLUCOSE, CAPILLARY  HEMOGLOBIN A1C  POC OCCULT BLOOD, ED  POC OCCULT BLOOD, ED  TYPE AND SCREEN    EKG  EKG Interpretation None       Radiology Dg Chest Portable 1 View  Result Date: 07/26/2016 CLINICAL DATA:  Acute onset of hematemesis.  Initial encounter. EXAM: PORTABLE CHEST 1 VIEW COMPARISON:  CT of the chest performed 11/19/2011 FINDINGS: The lungs are well-aerated and clear. There is no evidence of focal opacification, pleural effusion or pneumothorax. The cardiomediastinal silhouette is borderline normal in size. The patient is status post median sternotomy. There is a chronic fracture of the superiormost sternal wire. A pacemaker is noted overlying the left chest wall, with leads ending overlying the right atrium, right ventricle and coronary sinus. No acute osseous abnormalities are seen. IMPRESSION: No acute cardiopulmonary process seen. Electronically Signed   By: Garald Balding M.D.   On: 07/26/2016 23:39    Procedures Procedures (including critical care time)  Medications Ordered in ED Medications  atorvastatin (LIPITOR) tablet 40 mg (not administered)  cholecalciferol (VITAMIN D) tablet 2,000 Units (not administered)  sertraline (ZOLOFT) tablet 50 mg (not administered)  HYDROcodone-acetaminophen (NORCO/VICODIN) 5-325 MG per tablet 1 tablet (not administered)  nitroGLYCERIN (NITRODUR - Dosed in mg/24 hr) patch 0.2 mg (not administered)  pentoxifylline (TRENTAL) CR tablet 400 mg (not administered)  potassium chloride (K-DUR,KLOR-CON) CR tablet 10 mEq (not administered)  bisoprolol  (ZEBETA) tablet 10 mg (not administered)  memantine (  NAMENDA XR) 24 hr capsule 28 mg (not administered)  losartan (COZAAR) tablet 50 mg (not administered)  insulin aspart (novoLOG) injection 0-9 Units (0 Units Subcutaneous Not Given 07/27/16 0400)  sodium chloride flush (NS) 0.9 % injection 3 mL (3 mLs Intravenous Given 07/27/16 0255)  sodium chloride flush (NS) 0.9 % injection 3 mL (not administered)  0.9 %  sodium chloride infusion (not administered)  ondansetron (ZOFRAN) tablet 4 mg (not administered)    Or  ondansetron (ZOFRAN) injection 4 mg (not administered)  pantoprazole (PROTONIX) injection 40 mg (not administered)  mupirocin ointment (BACTROBAN) 2 % 1 application (not administered)  Chlorhexidine Gluconate Cloth 2 % PADS 6 each (not administered)  pantoprazole (PROTONIX) injection 40 mg (40 mg Intravenous Given 07/26/16 2317)  LORazepam (ATIVAN) injection 0.5 mg (0.5 mg Intravenous Given 07/27/16 0415)     Initial Impression / Assessment and Plan / ED Course  I have reviewed the triage vital signs and the nursing notes.  Pertinent labs & imaging results that were available during my care of the patient were reviewed by me and considered in my medical decision making (see chart for details).  Clinical Course as of Jul 28 711  Fri Jul 26, 2016  2309 No current vomiting or distress. Overall appears well. Keep NPO, check labs, IV protonix. Will treat as upper GI bleed, although will get CXR given this could be hemoptysis as well  [SG]  2357 On re-eval there is a small area of friable mucosa in right mid-nare. No current bleeding. Unclear if this was the source or related to vomiting blood coming out of nare.  [SG]    Clinical Course User Index [SG] Sherwood Gambler, MD    Given age and being on Eliquis, will admit for obs and serial hemoglobins. Given dementia, history is very difficult. Unclear if primary source is nose bleed or GI. Thus will observe. No current bleeding, appears  stable.   Final Clinical Impressions(s) / ED Diagnoses   Final diagnoses:  Hematemesis, presence of nausea not specified    New Prescriptions Current Discharge Medication List     I personally performed the services described in this documentation, which was scribed in my presence. The recorded information has been reviewed and is accurate.     Sherwood Gambler, MD 07/27/16 [phone]

## 2016-07-26 NOTE — ED Triage Notes (Signed)
Pt brought to ED by [facility] from [facility] for bleeding through her mouth, pt is on blood thinner for AFib, base line confuse with hx of dementia. No active bleeding on arrival to ED.

## 2016-07-27 ENCOUNTER — Encounter (HOSPITAL_COMMUNITY): Payer: Self-pay | Admitting: Family Medicine

## 2016-07-27 DIAGNOSIS — E1151 Type 2 diabetes mellitus with diabetic peripheral angiopathy without gangrene: Secondary | ICD-10-CM | POA: Diagnosis not present

## 2016-07-27 DIAGNOSIS — E1165 Type 2 diabetes mellitus with hyperglycemia: Secondary | ICD-10-CM | POA: Diagnosis not present

## 2016-07-27 DIAGNOSIS — I25810 Atherosclerosis of coronary artery bypass graft(s) without angina pectoris: Secondary | ICD-10-CM | POA: Diagnosis not present

## 2016-07-27 DIAGNOSIS — I482 Chronic atrial fibrillation: Secondary | ICD-10-CM | POA: Diagnosis not present

## 2016-07-27 DIAGNOSIS — K920 Hematemesis: Secondary | ICD-10-CM | POA: Diagnosis present

## 2016-07-27 DIAGNOSIS — I5032 Chronic diastolic (congestive) heart failure: Secondary | ICD-10-CM | POA: Diagnosis not present

## 2016-07-27 DIAGNOSIS — Z8673 Personal history of transient ischemic attack (TIA), and cerebral infarction without residual deficits: Secondary | ICD-10-CM | POA: Diagnosis not present

## 2016-07-27 LAB — GLUCOSE, CAPILLARY
GLUCOSE-CAPILLARY: 88 mg/dL (ref 65–99)
GLUCOSE-CAPILLARY: 119 mg/dL — AB (ref 65–99)
Glucose-Capillary: 98 mg/dL (ref 65–99)
Glucose-Capillary: 96 mg/dL (ref 65–99)
Glucose-Capillary: 93 mg/dL (ref 65–99)

## 2016-07-27 LAB — COMPREHENSIVE METABOLIC PANEL
ALBUMIN: 3.4 g/dL — AB (ref 3.5–5.0)
ALT: 15 U/L (ref 14–54)
ANION GAP: 9 (ref 5–15)
AST: 22 U/L (ref 15–41)
Alkaline Phosphatase: 58 U/L (ref 38–126)
BILIRUBIN TOTAL: 0.4 mg/dL (ref 0.3–1.2)
BUN: 18 mg/dL (ref 6–20)
CO2: 28 mmol/L (ref 22–32)
Calcium: 9.2 mg/dL (ref 8.9–10.3)
Chloride: 104 mmol/L (ref 101–111)
Creatinine, Ser: 1 mg/dL (ref 0.44–1.00)
GFR calc Af Amer: 54 mL/min — ABNORMAL LOW (ref >60)
GFR calc non Af Amer: 47 mL/min — ABNORMAL LOW (ref >60)
GLUCOSE: 129 mg/dL — AB (ref 65–99)
POTASSIUM: 3.5 mmol/L (ref 3.5–5.1)
SODIUM: 141 mmol/L (ref 135–145)
TOTAL PROTEIN: 6.7 g/dL (ref 6.5–8.1)

## 2016-07-27 LAB — CBC
HEMATOCRIT: 38.4 % (ref 36.0–46.0)
HEMOGLOBIN: 12.5 g/dL (ref 12.0–15.0)
MCH: 31.9 pg (ref 26.0–34.0)
MCHC: 32.6 g/dL (ref 30.0–36.0)
MCV: 98 fL (ref 78.0–100.0)
Platelets: 170 10*3/uL (ref 150–400)
RBC: 3.92 MIL/uL (ref 3.87–5.11)
RDW: 15.1 % (ref 11.5–15.5)
WBC: 7.3 10*3/uL (ref 4.0–10.5)

## 2016-07-27 LAB — MRSA PCR SCREENING: MRSA by PCR: POSITIVE — AB

## 2016-07-27 LAB — POC OCCULT BLOOD, ED: Fecal Occult Bld: NEGATIVE

## 2016-07-27 LAB — TYPE AND SCREEN
ABO/RH(D): A NEG
ANTIBODY SCREEN: NEGATIVE

## 2016-07-27 MED ORDER — MUPIROCIN 2 % EX OINT
1.0000 | TOPICAL_OINTMENT | Freq: Two times a day (BID) | CUTANEOUS | Status: DC
Start: 2016-07-27 — End: 2016-07-28
  Administered 2016-07-27 – 2016-07-28 (×3): 1 via NASAL
  Filled 2016-07-27: qty 22

## 2016-07-27 MED ORDER — SODIUM CHLORIDE 0.9% FLUSH: 3.0000 mL | INTRAVENOUS | Status: DC | PRN

## 2016-07-27 MED ORDER — MEMANTINE HCL ER 28 MG PO CP24
28.0000 mg | ORAL_CAPSULE | Freq: Every day | ORAL | Status: DC
  Administered 2016-07-27 – 2016-07-28 (×2): 28 mg via ORAL
  Filled 2016-07-27 (×2): qty 1

## 2016-07-27 MED ORDER — SODIUM CHLORIDE 0.9% FLUSH
3.0000 mL | Freq: Two times a day (BID) | INTRAVENOUS | Status: DC
  Administered 2016-07-27 – 2016-07-28 (×3): 3 mL via INTRAVENOUS

## 2016-07-27 MED ORDER — POTASSIUM CHLORIDE CRYS ER 10 MEQ PO TBCR
10.0000 meq | EXTENDED_RELEASE_TABLET | Freq: Every day | ORAL | Status: DC
  Administered 2016-07-27 – 2016-07-28 (×2): 10 meq via ORAL
  Filled 2016-07-27 (×3): qty 1

## 2016-07-27 MED ORDER — CHLORHEXIDINE GLUCONATE CLOTH 2 % EX PADS
6.0000 | MEDICATED_PAD | Freq: Every day | CUTANEOUS | Status: DC
  Administered 2016-07-27 – 2016-07-28 (×2): 6 via TOPICAL

## 2016-07-27 MED ORDER — SODIUM CHLORIDE 0.9 % IV SOLN: 250.0000 mL | INTRAVENOUS | Status: DC | PRN

## 2016-07-27 MED ORDER — BISOPROLOL FUMARATE 5 MG PO TABS
10.0000 mg | ORAL_TABLET | Freq: Every day | ORAL | Status: DC
  Administered 2016-07-27 – 2016-07-28 (×2): 10 mg via ORAL
  Filled 2016-07-27 (×2): qty 2

## 2016-07-27 MED ORDER — INSULIN ASPART 100 UNIT/ML ~~LOC~~ SOLN
0.0000 [IU] | SUBCUTANEOUS | Status: DC
  Administered 2016-07-28: 1 [IU] via SUBCUTANEOUS

## 2016-07-27 MED ORDER — LORAZEPAM 2 MG/ML IJ SOLN
0.5000 mg | Freq: Once | INTRAMUSCULAR | Status: AC
  Administered 2016-07-27: 0.5 mg via INTRAVENOUS
  Filled 2016-07-27: qty 1

## 2016-07-27 MED ORDER — NITROGLYCERIN 0.2 MG/HR TD PT24
0.2000 mg | MEDICATED_PATCH | Freq: Every day | TRANSDERMAL | Status: DC
  Administered 2016-07-27 – 2016-07-28 (×2): 0.2 mg via TRANSDERMAL
  Filled 2016-07-27 (×2): qty 1

## 2016-07-27 MED ORDER — PENTOXIFYLLINE ER 400 MG PO TBCR
400.0000 mg | EXTENDED_RELEASE_TABLET | Freq: Three times a day (TID) | ORAL | Status: DC
  Administered 2016-07-27 – 2016-07-28 (×4): 400 mg via ORAL
  Filled 2016-07-27 (×6): qty 1

## 2016-07-27 MED ORDER — ONDANSETRON HCL 4 MG/2ML IJ SOLN: 4.0000 mg | Freq: Four times a day (QID) | INTRAMUSCULAR | Status: DC | PRN

## 2016-07-27 MED ORDER — SERTRALINE HCL 50 MG PO TABS
50.0000 mg | ORAL_TABLET | Freq: Every day | ORAL | Status: DC
  Administered 2016-07-27 – 2016-07-28 (×2): 50 mg via ORAL
  Filled 2016-07-27 (×2): qty 1

## 2016-07-27 MED ORDER — PANTOPRAZOLE SODIUM 40 MG IV SOLR
40.0000 mg | Freq: Two times a day (BID) | INTRAVENOUS | Status: DC
  Administered 2016-07-27 – 2016-07-28 (×3): 40 mg via INTRAVENOUS
  Filled 2016-07-27 (×3): qty 40

## 2016-07-27 MED ORDER — VITAMIN D 1000 UNITS PO TABS
2000.0000 [IU] | ORAL_TABLET | Freq: Every day | ORAL | Status: DC
  Administered 2016-07-27 – 2016-07-28 (×2): 2000 [IU] via ORAL
  Filled 2016-07-27 (×2): qty 2

## 2016-07-27 MED ORDER — ATORVASTATIN CALCIUM 40 MG PO TABS
40.0000 mg | ORAL_TABLET | Freq: Every day | ORAL | Status: DC
  Administered 2016-07-27 – 2016-07-28 (×2): 40 mg via ORAL
  Filled 2016-07-27 (×2): qty 1

## 2016-07-27 MED ORDER — ONDANSETRON HCL 4 MG PO TABS: 4.0000 mg | ORAL_TABLET | Freq: Four times a day (QID) | ORAL | Status: DC | PRN

## 2016-07-27 MED ORDER — LOSARTAN POTASSIUM 50 MG PO TABS
50.0000 mg | ORAL_TABLET | Freq: Every day | ORAL | Status: DC
Start: 2016-07-27 — End: 2016-07-28
  Administered 2016-07-27 – 2016-07-28 (×2): 50 mg via ORAL
  Filled 2016-07-27 (×2): qty 1

## 2016-07-27 MED ORDER — HYDROCODONE-ACETAMINOPHEN 5-325 MG PO TABS: 1.0000 | ORAL_TABLET | Freq: Four times a day (QID) | ORAL | Status: DC | PRN

## 2016-07-27 NOTE — Progress Notes (Signed)
Telemetry d/c'd per MD order. CCMD notified. A.Kameko Hukill, RN

## 2016-07-27 NOTE — Progress Notes (Signed)
Pt very confused and agitated, pulling off cardiac monitor, gown and was trying to get out of bed, pt given ativan 0.5mg  IV. Will continue to monitor.

## 2016-07-27 NOTE — Progress Notes (Signed)
Patient not able to answer admission questions, will wait for daughter tomorrow.

## 2016-07-27 NOTE — Progress Notes (Addendum)
PROGRESS NOTE    Judith Zuniga  GHW:[id number] DOB: 1920-09-01 DOA: 07/26/2016 PCP: Wenda Low, MD   Chief Complaint  Patient presents with  . Hematemesis     Brief Narrative:  Judith Zuniga is a 83 y.o. female with medical history significant for dementia, history of CVA, coronary artery disease status post bypass, chronic atrial fibrillation on warfarin, type 2 diabetes mellitus, and hypertension who presents to the emergency department from her skilled nursing facility for evaluation of hematemesis. Patient had reportedly been in her usual state at the SNF until tonight while at dinner when she was noted to have a large volume of dark blood that she was spitting up or vomiting. There was reportedly 2 episodes of this and EMS was called out. She seemed to be having an uneventful day leading up to this and had not been voicing any complaints. Patient has advanced dementia and is unable to contribute much of anything to the history. History is therefore obtained through discussion with the ED personnel and SNF personnel, as well as review of the EMR. Assessment & Plan   Hematemesis  -Presented from SNF where she was noted to have 2 episodes of dark hematemesis or spitting-up of dark blood  -She had otherwise seemed to be in her usual state and has not had any complaints  -She has been on warfarin for chronic a fib, but INR only 1.22 on presentation  -Initial Hgb is 11.3 and stable with FOBT negative, current hemoglobin 12.5 -Type and screen was performed in ED and she was given a 40 mg IV of Protonix  -Plan to monitor on telemetry, hold ASA and warfarin, continue Protonix 40 mg IV q12h, and trend H&H   -FOBT negative  Chronic atrial fibrillation -CHADS-VASC is 55 (age x2, CVA x2, gender, CHF, CAD, DM) -Warfarin held in light of hematemesis, rate is well-controlled  -Continue bisoprolol   Chronic diastolic CHF  -Pt appears roughly euvolemic on admission, maybe a little dry   -TTE  (05/05/14) with EF 55-60%, mild TR, moderate AS, mild-mod MR, severe LAE, and elevated PA pressures  -Managed at home with Lasix 20 mg qD  -Hold Lasix for now, continue losartan and bisoprolol as tolerated  -Follow I/O's and daily wts  Diabetes mellitus, type II -A1c was 6.3% in Jan '16  -Managed with glimeperide at home; currently held  -Continue ISS and CBG monitoring   History of CVA  -No new deficits identified  -ASA held in light of hematemesis, Lipitor will be continued    Anemia, thrombocytopenia -Hgb 11.3 and stable relative to priors  -Platelets was at 137, currently 170 -Continue to monitor CBC  DVT Prophylaxis  SCDs  Code Status: Full  Family Communication: None at bedside  Disposition Plan: Currently in observation. Poss d/c to SNF on 07/28/2016 depending if Hemoglobin stable  Consultants None  Procedures  None  Antibiotics   Anti-infectives    None      Subjective:   Judith Zuniga seen and examined today.  Confused. Has no complaints.   Objective:   Vitals:   07/27/16 0231 07/27/16 0233 07/27/16 0629 07/27/16 0810  BP:  140/75 (!) 100/50 105/60  Pulse:  78 76 77  Resp:  17 16 18   Temp:  98.2 F (36.8 C)  98.5 F (36.9 C)  TempSrc:  Oral  Oral  SpO2:  98% 98% 96%  Weight: 46.1 kg (101 lb 11.2 oz)     Height: 5\' 5"  (1.651 m)  Intake/Output Summary (Last 24 hours) at 07/27/16 1049 Last data filed at 07/27/16 0820  Gross per 24 hour  Intake              123 ml  Output                0 ml  Net              123 ml   Filed Weights   07/26/16 2224 07/27/16 0231  Weight: 54.9 kg (121 lb) 46.1 kg (101 lb 11.2 oz)    Exam  General: Well developed, well nourished, NAD, appears stated age  24: NCAT, mucous membranes moist.   Cardiovascular: S1 S2 auscultated, irregular, 3/6 SEM   Respiratory: Clear to auscultation bilaterally with equal chest rise  Abdomen: Soft, nontender, nondistended, + bowel sounds  Extremities: warm dry  without cyanosis clubbing or edema  Neuro: AAOx1, nonfocal   Psych: Appropriate  Data Reviewed: I have personally reviewed following labs and imaging studies  CBC:  Recent Labs Lab 07/26/16 2326 07/27/16 0307  WBC 7.7 7.3  NEUTROABS 4.9  --   HGB 11.3* 12.5  HCT 34.7* 38.4  MCV 97.5 98.0  PLT 137* 509   Basic Metabolic Panel:  Recent Labs Lab 07/26/16 2326  NA 141  K 3.5  CL 104  CO2 28  GLUCOSE 129*  BUN 18  CREATININE 1.00  CALCIUM 9.2   GFR: Estimated Creatinine Clearance: 25 mL/min (by C-G formula based on SCr of 1 mg/dL). Liver Function Tests:  Recent Labs Lab 07/26/16 2326  AST 22  ALT 15  ALKPHOS 58  BILITOT 0.4  PROT 6.7  ALBUMIN 3.4*   No results for input(s): LIPASE, AMYLASE in the last 168 hours. No results for input(s): AMMONIA in the last 168 hours. Coagulation Profile:  Recent Labs Lab 07/26/16 2326  INR 1.22   Cardiac Enzymes: No results for input(s): CKTOTAL, CKMB, CKMBINDEX, TROPONINI in the last 168 hours. BNP (last 3 results) No results for input(s): PROBNP in the last 8760 hours. HbA1C: No results for input(s): HGBA1C in the last 72 hours. CBG:  Recent Labs Lab 07/27/16 0230 07/27/16 0803  GLUCAP 98 88   Lipid Profile: No results for input(s): CHOL, HDL, LDLCALC, TRIG, CHOLHDL, LDLDIRECT in the last 72 hours. Thyroid Function Tests: No results for input(s): TSH, T4TOTAL, FREET4, T3FREE, THYROIDAB in the last 72 hours. Anemia Panel: No results for input(s): VITAMINB12, FOLATE, FERRITIN, TIBC, IRON, RETICCTPCT in the last 72 hours. Urine analysis:    Component Value Date/Time   COLORURINE YELLOW 05/05/2014 0156   APPEARANCEUR CLEAR 05/05/2014 0156   LABSPEC 1.010 05/05/2014 0156   PHURINE 5.0 05/05/2014 0156   GLUCOSEU NEGATIVE 05/05/2014 0156   HGBUR SMALL (A) 05/05/2014 0156   BILIRUBINUR NEGATIVE 05/05/2014 0156   KETONESUR NEGATIVE 05/05/2014 0156   PROTEINUR NEGATIVE 05/05/2014 0156   UROBILINOGEN 0.2  05/05/2014 0156   NITRITE NEGATIVE 05/05/2014 0156   LEUKOCYTESUR NEGATIVE 05/05/2014 0156   Sepsis Labs: @LABRCNTIP (procalcitonin:4,lacticidven:4)  ) Recent Results (from the past 240 hour(s))  MRSA PCR Screening     Status: Abnormal   Collection Time: 07/27/16  2:22 AM  Result Value Ref Range Status   MRSA by PCR POSITIVE (A) NEGATIVE Final    Comment:        The GeneXpert MRSA Assay (FDA approved for NASAL specimens only), is one component of a comprehensive MRSA colonization surveillance program. It is not intended to diagnose MRSA infection nor to guide  or monitor treatment for MRSA infections. RESULT CALLED TO, READ BACK BY AND VERIFIED WITH: J THOMAS,RN @0642  07/27/16 MKELLY,MLT       Radiology Studies: Dg Chest Portable 1 View  Result Date: 07/26/2016 CLINICAL DATA:  Acute onset of hematemesis.  Initial encounter. EXAM: PORTABLE CHEST 1 VIEW COMPARISON:  CT of the chest performed 11/19/2011 FINDINGS: The lungs are well-aerated and clear. There is no evidence of focal opacification, pleural effusion or pneumothorax. The cardiomediastinal silhouette is borderline normal in size. The patient is status post median sternotomy. There is a chronic fracture of the superiormost sternal wire. A pacemaker is noted overlying the left chest wall, with leads ending overlying the right atrium, right ventricle and coronary sinus. No acute osseous abnormalities are seen. IMPRESSION: No acute cardiopulmonary process seen. Electronically Signed   By: Garald Balding M.D.   On: 07/26/2016 23:39     Scheduled Meds: . atorvastatin  40 mg Oral Daily  . bisoprolol  10 mg Oral Daily  . Chlorhexidine Gluconate Cloth  6 each Topical Q0600  . cholecalciferol  2,000 Units Oral Daily  . insulin aspart  0-9 Units Subcutaneous Q4H  . losartan  50 mg Oral Daily  . memantine  28 mg Oral Daily  . mupirocin ointment  1 application Nasal BID  . nitroGLYCERIN  0.2 mg Transdermal Daily  . pantoprazole  (PROTONIX) IV  40 mg Intravenous Q12H  . pentoxifylline  400 mg Oral TID WC  . potassium chloride  10 mEq Oral Daily  . sertraline  50 mg Oral Daily  . sodium chloride flush  3 mL Intravenous Q12H   Continuous Infusions:   LOS: 0 days   Time Spent in minutes  30 minutes  Abdou Stocks D.O. on 07/27/2016 at 10:49 AM  Between 7am to 7pm - Pager - [phone]  After 7pm go to [email or web address] - password [email or web address]  And look for the night coverage person covering for me after hours  [facility] Office  [phone]

## 2016-07-27 NOTE — H&P (Signed)
History and Physical    Judith Zuniga ZHG:[id number] DOB: 06-28-1920 DOA: 07/26/2016  PCP: Wenda Low, MD   Patient coming from: SNF   Chief Complaint: Hematemesis   HPI: Judith Zuniga is a 83 y.o. female with medical history significant for dementia, history of CVA, coronary artery disease status post bypass, chronic atrial fibrillation on warfarin, type 2 diabetes mellitus, and hypertension who presents to the emergency department from her skilled nursing facility for evaluation of hematemesis. Patient had reportedly been in her usual state at the SNF until tonight while at dinner when she was noted to have a large volume of dark blood that she was spitting up or vomiting. There was reportedly 2 episodes of this and EMS was called out. She seemed to be having an uneventful day leading up to this and had not been voicing any complaints. Patient has advanced dementia and is unable to contribute much of anything to the history. History is therefore obtained through discussion with the ED personnel and SNF personnel, as well as review of the EMR.  ED Course: Upon arrival to the ED, patient is found to be afebrile, saturating adequately on room air, and with vital signs otherwise stable. Chest x-ray is negative for acute cardiopulmonary disease and chemistry panel is unremarkable. CBC is notable for a stable normocytic anemia with hemoglobin of 11.3, and a mild from cytopenia with platelets 137,000. INR is subtherapeutic at 1.22. Fecal occult blood testing is negative. Type and screen was performed and the patient was given 40 mg IV push her Protonix. She remained hemodynamically stable in the emergency department and has not been in any apparent respiratory distress. She will be observed on the telemetry unit for ongoing evaluation and management of hematemesis.  Review of Systems:  Unable to obtain secondary to clinical condition with advanced dementia.  Past Medical History:  Diagnosis Date  .  Atrioventricular block, complete s/p AV ablation   . CAD (coronary artery disease)    s/p redo bypass surgery in 1998 with patent graft in Jan. 2009.   Marland Kitchen Dementia   . Diabetes mellitus   . Elevated liver function tests    on amiodarone  . Hyperlipidemia   . Hypertension   . Ischemic cardiomyopathy    ischemic heart myopathy , ejection fraction of 25%  . Pacemaker -CRT- [employer]   . Permanent atrial fibrillation   . Premature ventricular contractions    frequent  . Systolic heart failure    class II and euvolemic  . Type II or unspecified type diabetes mellitus without mention of complication, not stated as uncontrolled     Past Surgical History:  Procedure Laterality Date  . AMPUTATION Right 05/11/2014   Procedure: AMPUTATION BELOW KNEE;  Surgeon: Newt Minion, MD;  Location: [facility];  Service: Orthopedics;  Laterality: Right;  . cardiac caths     multile  . CORONARY ARTERY BYPASS GRAFT  1998   CABG x5: RIMA to diagonal one, RIMA to diagonal 2, SVG to PD, SVG to PL, SVG to circ  . CORONARY ARTERY BYPASS GRAFT  1985   x3: LIMA to LAD, SVG to PD and SVG to PL  . pacer generator change out     [employer] pacemaker     reports that she has never smoked. She has never used smokeless tobacco. She reports that she does not drink alcohol or use drugs.  No Known Allergies  Family History  Problem Relation Age of Onset  . Other  Mother     pacemaker  . Heart disease Father   . Cancer Sister     gallbladder     Prior to Admission medications   Medication Sig Start Date End Date Taking? Authorizing Provider  aspirin EC 81 MG EC tablet Take 1 tablet (81 mg total) by mouth daily. 05/15/14   Costin Karlyne Greenspan, MD  atorvastatin (LIPITOR) 40 MG tablet Take 40 mg by mouth daily.    Historical Provider, MD  bisoprolol (ZEBETA) 10 MG tablet Take 1 tablet (10 mg total) by mouth daily. 12/15/13   Larey Dresser, MD  Cholecalciferol (VITAMIN D-3) 1000 UNITS CAPS Take 2 capsules by mouth daily.     Historical Provider, MD  enoxaparin (LOVENOX) 60 MG/0.6ML injection Inject 0.55 mLs (55 mg total) into the skin every 12 (twelve) hours. 05/15/14   Costin Karlyne Greenspan, MD  ferrous sulfate 325 (65 FE) MG tablet Take 325 mg by mouth 2 (two) times daily with a meal.    Historical Provider, MD  furosemide (LASIX) 40 MG tablet TAKE1/2 TABLET BY MOUTH EVERY DAY 02/16/14   Larey Dresser, MD  glimepiride (AMARYL) 1 MG tablet Take 1 mg by mouth daily with breakfast.  06/12/13   Historical Provider, MD  HYDROcodone-acetaminophen (NORCO/VICODIN) 5-325 MG per tablet Take 1 tablet by mouth every 6 (six) hours as needed for moderate pain. 05/15/14   Costin Karlyne Greenspan, MD  losartan (COZAAR) 50 MG tablet Take 50 mg by mouth daily.  08/23/11   Larey Dresser, MD  Memantine HCl ER (NAMENDA XR) 28 MG CP24 Take 1 capsule by mouth daily.    Historical Provider, MD  nitroGLYCERIN (NITRODUR - DOSED IN MG/24 HR) 0.2 mg/hr patch Place 0.2 mg onto the skin daily.    Historical Provider, MD  pentoxifylline (TRENTAL) 400 MG CR tablet Take 400 mg by mouth 3 (three) times daily with meals.    Historical Provider, MD  potassium chloride (K-DUR) 10 MEQ tablet TAKE ONE TABLET BY MOUTH ONCE DAILY 01/06/14   Larey Dresser, MD  sertraline (ZOLOFT) 50 MG tablet Take 50 mg by mouth daily.    Historical Provider, MD  warfarin (COUMADIN) 5 MG tablet Take 1 tablet (5 mg total) by mouth as directed. Patient taking differently: Take 5-7.5 mg by mouth daily. 5mg  daily except for on Wednesday take 7.5mg  02/16/14   Larey Dresser, MD    Physical Exam: Vitals:   07/26/16 2330 07/27/16 0030 07/27/16 0130 07/27/16 0157  BP: (!) 144/70 (!) 142/63 139/78   Pulse: 69 77 77   Resp: 18 (!) 21 19   Temp:    98.4 F (36.9 C)  TempSrc:    Oral  SpO2: 97% 98% 95%   Weight:      Height:          Constitutional: NAD, calm, comfortable Eyes: PERTLA, lids and conjunctivae normal ENMT: Mucous membranes are moist. Posterior pharynx clear of any  exudate or lesions.   Neck: normal, supple, no masses, no thyromegaly Respiratory: clear to auscultation bilaterally, no wheezing, no crackles. Normal respiratory effort.   Cardiovascular: Rate ~80 and irregularly irregular; grade 3 murmurs at upper SB and apex. No significant JVD. Abdomen: No distension, no tenderness, no masses palpated. Bowel sounds normal.  Musculoskeletal: no clubbing / cyanosis. No joint deformity upper and lower extremities.   Skin: no significant rashes, lesions, ulcers. Warm, dry, well-perfused. No pallor.  Neurologic: No gross facial asymmetry, moving all extremities equally, PERRL.  Psychiatric: Alert, oriented to person only. Pleasant.     Labs on Admission: I have personally reviewed following labs and imaging studies  CBC:  Recent Labs Lab 07/26/16 2326  WBC 7.7  NEUTROABS 4.9  HGB 11.3*  HCT 34.7*  MCV 97.5  PLT 371*   Basic Metabolic Panel:  Recent Labs Lab 07/26/16 2326  NA 141  K 3.5  CL 104  CO2 28  GLUCOSE 129*  BUN 18  CREATININE 1.00  CALCIUM 9.2   GFR: Estimated Creatinine Clearance: 28.5 mL/min (by C-G formula based on SCr of 1 mg/dL). Liver Function Tests:  Recent Labs Lab 07/26/16 2326  AST 22  ALT 15  ALKPHOS 58  BILITOT 0.4  PROT 6.7  ALBUMIN 3.4*   No results for input(s): LIPASE, AMYLASE in the last 168 hours. No results for input(s): AMMONIA in the last 168 hours. Coagulation Profile:  Recent Labs Lab 07/26/16 2326  INR 1.22   Cardiac Enzymes: No results for input(s): CKTOTAL, CKMB, CKMBINDEX, TROPONINI in the last 168 hours. BNP (last 3 results) No results for input(s): PROBNP in the last 8760 hours. HbA1C: No results for input(s): HGBA1C in the last 72 hours. CBG: No results for input(s): GLUCAP in the last 168 hours. Lipid Profile: No results for input(s): CHOL, HDL, LDLCALC, TRIG, CHOLHDL, LDLDIRECT in the last 72 hours. Thyroid Function Tests: No results for input(s): TSH, T4TOTAL, FREET4,  T3FREE, THYROIDAB in the last 72 hours. Anemia Panel: No results for input(s): VITAMINB12, FOLATE, FERRITIN, TIBC, IRON, RETICCTPCT in the last 72 hours. Urine analysis:    Component Value Date/Time   COLORURINE YELLOW 05/05/2014 0156   APPEARANCEUR CLEAR 05/05/2014 0156   LABSPEC 1.010 05/05/2014 0156   PHURINE 5.0 05/05/2014 0156   GLUCOSEU NEGATIVE 05/05/2014 0156   HGBUR SMALL (A) 05/05/2014 0156   BILIRUBINUR NEGATIVE 05/05/2014 0156   KETONESUR NEGATIVE 05/05/2014 0156   PROTEINUR NEGATIVE 05/05/2014 0156   UROBILINOGEN 0.2 05/05/2014 0156   NITRITE NEGATIVE 05/05/2014 0156   LEUKOCYTESUR NEGATIVE 05/05/2014 0156   Sepsis Labs: @LABRCNTIP (procalcitonin:4,lacticidven:4) )No results found for this or any previous visit (from the past 240 hour(s)).   Radiological Exams on Admission: Dg Chest Portable 1 View  Result Date: 07/26/2016 CLINICAL DATA:  Acute onset of hematemesis.  Initial encounter. EXAM: PORTABLE CHEST 1 VIEW COMPARISON:  CT of the chest performed 11/19/2011 FINDINGS: The lungs are well-aerated and clear. There is no evidence of focal opacification, pleural effusion or pneumothorax. The cardiomediastinal silhouette is borderline normal in size. The patient is status post median sternotomy. There is a chronic fracture of the superiormost sternal wire. A pacemaker is noted overlying the left chest wall, with leads ending overlying the right atrium, right ventricle and coronary sinus. No acute osseous abnormalities are seen. IMPRESSION: No acute cardiopulmonary process seen. Electronically Signed   By: Garald Balding M.D.   On: 07/26/2016 23:39    EKG: Not performed, will obtain as appropriate.    Assessment/Plan  1. Hematemesis  - Pt presents from SNF where she was noted to have 2 episodes of dark hematemesis or spitting-up of dark blood  - She had otherwise seemed to be in her usual state and has not had any complaints  - She has been on warfarin for chronic a fib,  but INR only 1.22 on presentation  - Initial Hgb is 11.3 and stable with FOBT negative  - Type and screen was performed in ED and she was given a 40 mg IVP of Protonix  -  Plan to monitor on telemetry, hold ASA and warfarin, continue Protonix 40 mg IV q12h, and trend H&H    2. Chronic atrial fibrillation - CHADS-VASC is 54 (age x2, CVA x2, gender, CHF, CAD, DM) - Warfarin held in light of hematemesis, rate is well-controlled  - Continue bisoprolol   3. Chronic diastolic CHF  - Pt appears roughly euvolemic on admission, maybe a little dry   - TTE (05/05/14) with EF 55-60%, mild TR, moderate AS, mild-mod MR, severe LAE, and elevated PA pressures  - Managed at home with Lasix 20 mg qD  - Hold Lasix for now, continue losartan and bisoprolol as tolerated  - Follow I/O's and daily wts, SLIV   4. Type II DM   - A1c was 6.3% in Jan '16  - Managed with glimeperide at home; will be held  - Check CBG q4h and use low-intensity sliding-scale insulin as needed   5. Hx of CVA  - No new deficits identified  - ASA held in light of hematemesis, Lipitor will be continued    6. Anemia, thrombocytopenia - Hgb 11.3 and stable relative to priors  - Platelets slightly low at 137k and stable  - Type and screen done as above, repeat CBC in am     DVT prophylaxis: SCD Code Status: Full  Family Communication: Discussed with patient Disposition Plan: Observe on telemetry Consults called: None Admission status: Observation    Vianne Bulls, MD [facility] Pager [phone]  If 7PM-7AM, please contact night-coverage [email or web address] Password [email or web address]  07/27/2016, 2:20 AM

## 2016-07-27 NOTE — Progress Notes (Signed)
Patient arrived to unit very sweet and pleasant at first, only oriented to person. Patient cleaned off (old, dried blood), CCMD called and call bell in reach. Patient then started to try to get out of bed saying she could walk and that her husband will be coming to get her. Patient very confused and doesn't want to stay here in the hospital. Bottom red, foam placed.

## 2016-07-27 NOTE — Progress Notes (Signed)
Microbiology lab called- positive for MRSA. MRSA precautions started.

## 2016-07-27 NOTE — ED Notes (Signed)
Admitting MD at the bedside.  

## 2016-07-28 DIAGNOSIS — Z8673 Personal history of transient ischemic attack (TIA), and cerebral infarction without residual deficits: Secondary | ICD-10-CM

## 2016-07-28 DIAGNOSIS — E1151 Type 2 diabetes mellitus with diabetic peripheral angiopathy without gangrene: Secondary | ICD-10-CM

## 2016-07-28 DIAGNOSIS — I5032 Chronic diastolic (congestive) heart failure: Secondary | ICD-10-CM

## 2016-07-28 DIAGNOSIS — I482 Chronic atrial fibrillation: Secondary | ICD-10-CM

## 2016-07-28 DIAGNOSIS — I25810 Atherosclerosis of coronary artery bypass graft(s) without angina pectoris: Secondary | ICD-10-CM

## 2016-07-28 DIAGNOSIS — K920 Hematemesis: Secondary | ICD-10-CM | POA: Diagnosis not present

## 2016-07-28 DIAGNOSIS — E1165 Type 2 diabetes mellitus with hyperglycemia: Secondary | ICD-10-CM

## 2016-07-28 LAB — CBC
HEMATOCRIT: 34.7 % — AB (ref 36.0–46.0)
Hemoglobin: 11.5 g/dL — ABNORMAL LOW (ref 12.0–15.0)
MCH: 32.1 pg (ref 26.0–34.0)
MCHC: 33.1 g/dL (ref 30.0–36.0)
MCV: 96.9 fL (ref 78.0–100.0)
PLATELETS: 150 10*3/uL (ref 150–400)
RBC: 3.58 MIL/uL — ABNORMAL LOW (ref 3.87–5.11)
RDW: 14.8 % (ref 11.5–15.5)
WBC: 5.8 10*3/uL (ref 4.0–10.5)

## 2016-07-28 LAB — GLUCOSE, CAPILLARY
GLUCOSE-CAPILLARY: 85 mg/dL (ref 65–99)
GLUCOSE-CAPILLARY: 121 mg/dL — AB (ref 65–99)
Glucose-Capillary: 86 mg/dL (ref 65–99)
Glucose-Capillary: 90 mg/dL (ref 65–99)

## 2016-07-28 LAB — HEMOGLOBIN A1C
Hgb A1c MFr Bld: 6.1 % — ABNORMAL HIGH (ref 4.8–5.6)
MEAN PLASMA GLUCOSE: 128 mg/dL

## 2016-07-28 MED ORDER — APIXABAN 2.5 MG PO TABS
2.5000 mg | ORAL_TABLET | Freq: Two times a day (BID) | ORAL | Status: DC
  Administered 2016-07-28: 2.5 mg via ORAL
  Filled 2016-07-28: qty 1

## 2016-07-28 NOTE — Discharge Summary (Signed)
Physician Discharge Summary  Judith Zuniga:[id number] DOB: 1920/12/18 DOA: 07/26/2016  PCP: Wenda Low, MD  Admit date: 07/26/2016 Discharge date: 07/28/2016  Time spent: 45 minutes  Recommendations for Outpatient Follow-up:  Patient will be discharged to skilled nursing facility.  Patient will need to follow up with primary care provider within one week of discharge, repeat CBC in one week.  Patient should continue medications as prescribed.  Patient should follow a carb modified diet.   Discharge Diagnoses:  Hematemesis  Chronic atrial fibrillation Chronic diastolic CHF  Diabetes mellitus, type II History of CVA  Anemia, thrombocytopenia  Discharge Condition: Stable  Diet recommendation: Carb modified  Filed Weights   07/26/16 2224 07/27/16 0231 07/28/16 0606  Weight: 54.9 kg (121 lb) 46.1 kg (101 lb 11.2 oz) 46.2 kg (101 lb 12.8 oz)    History of present illness:  On 07/27/16 by Dr. Christia Reading Opyd Judith Zuniga is a 83 y.o. female with medical history significant for dementia, history of CVA, coronary artery disease status post bypass, chronic atrial fibrillation on warfarin, type 2 diabetes mellitus, and hypertension who presents to the emergency department from her skilled nursing facility for evaluation of hematemesis. Patient had reportedly been in her usual state at the SNF until tonight while at dinner when she was noted to have a large volume of dark blood that she was spitting up or vomiting. There was reportedly 2 episodes of this and EMS was called out. She seemed to be having an uneventful day leading up to this and had not been voicing any complaints. Patient has advanced dementia and is unable to contribute much of anything to the history. History is therefore obtained through discussion with the ED personnel and SNF personnel, as well as review of the EMR.  Hospital Course:  Hematemesis  -Presented from SNF where she was noted to have 2 episodes of dark  hematemesis or spitting-up of dark blood  -She had otherwise seemed to be in her usual state and has not had any complaints  -She has been on warfarin for chronic a fib, but INR only 1.22 on presentation  -Initial Hgb is 11.3 and stable with FOBT negative, current hemoglobin 11.5 -Type and screen was performed in ED and she was given a 40 mg IV of Protonix  -Aspirin and coumadin were held (however I spoke with daughter and patient is Eliquis) -FOBT negative -Per daughter patient has a history of nose bleeds but has been on Eliquis for over a year. Discussed with daughter whether patient should continue anticoagulation, she would like to discuss this with PCP and continue for now, and understand the risks of bleeding.  -Will discharge patient with Eliquis 2.5mg  PO BID (it seems that 5mg  dose was filled in March 2018- discussed this with pharmacy)  Chronic atrial fibrillation -CHADS-VASC is 54 (age x2, CVA x2, gender, CHF, CAD, DM) -Warfarin held in light of hematemesis, rate is well-controlled  -As above, patient is on Eliquis, which explains why INR was subtherapeutic) -Continue bisoprolol  Chronic diastolic CHF  -Pt appears roughly euvolemic on admission, maybe a little dry  -TTE (05/05/14) with EF 55-60%, mild TR, moderate AS, mild-mod MR, severe LAE, and elevated PA pressures  -Managed at home with Lasix 20 mg qD  -Hold Lasix for now, continue losartan and bisoprolol as tolerated  -Follow I/O's and daily wts -Continue lasix   Diabetes mellitus, type II -A1c was 6.3% in Jan '16  -Managed with glimeperide at home; currently held - but may  continue upon discharge  History of CVA  -No new deficits identified  -ASA held in light of hematemesis, Lipitor will be continued  -Continue Eliquis upon discharge  Anemia, thrombocytopenia -Stable continue to monitor CBC  Procedures: None  Consultations: None  Discharge Exam: Vitals:   07/27/16 2100 07/28/16 0606  BP: (!) 148/81  122/78  Pulse: 76 78  Resp: 18 18  Temp: 97.6 F (36.4 C) 97.7 F (36.5 C)   Exam  General: Well developed, well nourished, NAD, appears stated age  HEENT: NCAT, mucous membranes moist.   Cardiovascular: S1 S2 auscultated, irregular, 3/6 SEM   Respiratory: Clear to auscultation bilaterally with equal chest rise  Abdomen: Soft, nontender, nondistended, + bowel sounds  Extremities: warm dry without cyanosis clubbing or edema  Neuro: AAOx1, nonfocal   Psych: Appropriate  Discharge Instructions Discharge Instructions    Discharge instructions    Complete by:  As directed    Patient will be discharged to skilled nursing facility.  Patient will need to follow up with primary care provider within one week of discharge, repeat CBC in one week.  Patient should continue medications as prescribed.  Patient should follow a carb modified diet.     Current Discharge Medication List    CONTINUE these medications which have NOT CHANGED   Details  apixaban (ELIQUIS) 2.5 MG TABS tablet Take 2.5 mg by mouth 2 (two) times daily.    aspirin EC 81 MG EC tablet Take 1 tablet (81 mg total) by mouth daily.    atorvastatin (LIPITOR) 40 MG tablet Take 40 mg by mouth daily.    bisoprolol (ZEBETA) 10 MG tablet Take 1 tablet (10 mg total) by mouth daily. Qty: 30 tablet, Refills: 6   Associated Diagnoses: Chronic diastolic heart failure ([place]); Mitral valve disorders(424.0); Paroxysmal atrial fibrillation ([place]); Shortness of breath    Cholecalciferol (VITAMIN D-3) 1000 UNITS CAPS Take 2 capsules by mouth daily.    ferrous sulfate 325 (65 FE) MG tablet Take 325 mg by mouth 2 (two) times daily with a meal.    furosemide (LASIX) 40 MG tablet TAKE1/2 TABLET BY MOUTH EVERY DAY Qty: 30 tablet, Refills: 1    glimepiride (AMARYL) 1 MG tablet Take 1 mg by mouth daily with breakfast.     HYDROcodone-acetaminophen (NORCO/VICODIN) 5-325 MG per tablet Take 1 tablet by mouth every 6 (six) hours as needed  for moderate pain. Qty: 30 tablet, Refills: 0    losartan (COZAAR) 50 MG tablet Take 50 mg by mouth daily.     Memantine HCl ER (NAMENDA XR) 28 MG CP24 Take 1 capsule by mouth daily.    nitroGLYCERIN (NITRODUR - DOSED IN MG/24 HR) 0.2 mg/hr patch Place 0.2 mg onto the skin daily.    pentoxifylline (TRENTAL) 400 MG CR tablet Take 400 mg by mouth 3 (three) times daily with meals.    potassium chloride (K-DUR) 10 MEQ tablet TAKE ONE TABLET BY MOUTH ONCE DAILY Qty: 90 tablet, Refills: 1    sertraline (ZOLOFT) 50 MG tablet Take 50 mg by mouth daily.      STOP taking these medications     enoxaparin (LOVENOX) 60 MG/0.6ML injection        No Known Allergies Follow-up Information    HUSAIN,KARRAR, MD. Schedule an appointment as soon as possible for a visit in 1 week(s).   Specialty:  Internal Medicine Why:  Hospital follow up Contact information: [address] [place] [address] [place] [address] [phone]  The results of significant diagnostics from this hospitalization (including imaging, microbiology, ancillary and laboratory) are listed below for reference.    Significant Diagnostic Studies: Dg Chest Portable 1 View  Result Date: 07/26/2016 CLINICAL DATA:  Acute onset of hematemesis.  Initial encounter. EXAM: PORTABLE CHEST 1 VIEW COMPARISON:  CT of the chest performed 11/19/2011 FINDINGS: The lungs are well-aerated and clear. There is no evidence of focal opacification, pleural effusion or pneumothorax. The cardiomediastinal silhouette is borderline normal in size. The patient is status post median sternotomy. There is a chronic fracture of the superiormost sternal wire. A pacemaker is noted overlying the left chest wall, with leads ending overlying the right atrium, right ventricle and coronary sinus. No acute osseous abnormalities are seen. IMPRESSION: No acute cardiopulmonary process seen. Electronically Signed   By: Garald Balding M.D.   On: 07/26/2016 23:39     Microbiology: Recent Results (from the past 240 hour(s))  MRSA PCR Screening     Status: Abnormal   Collection Time: 07/27/16  2:22 AM  Result Value Ref Range Status   MRSA by PCR POSITIVE (A) NEGATIVE Final    Comment:        The GeneXpert MRSA Assay (FDA approved for NASAL specimens only), is one component of a comprehensive MRSA colonization surveillance program. It is not intended to diagnose MRSA infection nor to guide or monitor treatment for MRSA infections. RESULT CALLED TO, READ BACK BY AND VERIFIED WITH: J THOMAS,RN @0642  07/27/16 MKELLY,MLT      Labs: Basic Metabolic Panel:  Recent Labs Lab 07/26/16 2326  NA 141  K 3.5  CL 104  CO2 28  GLUCOSE 129*  BUN 18  CREATININE 1.00  CALCIUM 9.2   Liver Function Tests:  Recent Labs Lab 07/26/16 2326  AST 22  ALT 15  ALKPHOS 58  BILITOT 0.4  PROT 6.7  ALBUMIN 3.4*   No results for input(s): LIPASE, AMYLASE in the last 168 hours. No results for input(s): AMMONIA in the last 168 hours. CBC:  Recent Labs Lab 07/26/16 2326 07/27/16 0307 07/28/16 0925  WBC 7.7 7.3 5.8  NEUTROABS 4.9  --   --   HGB 11.3* 12.5 11.5*  HCT 34.7* 38.4 34.7*  MCV 97.5 98.0 96.9  PLT 137* 170 150   Cardiac Enzymes: No results for input(s): CKTOTAL, CKMB, CKMBINDEX, TROPONINI in the last 168 hours. BNP: BNP (last 3 results) No results for input(s): BNP in the last 8760 hours.  ProBNP (last 3 results) No results for input(s): PROBNP in the last 8760 hours.  CBG:  Recent Labs Lab 07/27/16 1608 07/27/16 2020 07/28/16 0027 07/28/16 0401 07/28/16 0747  GLUCAP 93 119* 90 85 86       Signed:  Besan Ketchem  [facility] 07/28/2016, 10:57 AM

## 2016-07-28 NOTE — Progress Notes (Signed)
Called [facility] twice for report but just a long hold each time more than 15 min, asked secretary to call RN back if they have any questions

## 2016-07-28 NOTE — Discharge Instructions (Signed)
Hematemesis Hematemesis is when you vomit blood. It is a sign of bleeding in the upper part of your digestive tract. This is also called your gastrointestinal (GI) tract. Your upper GI tract includes your mouth, throat, esophagus, stomach, and the first part of your small intestine (duodenum). Hematemesis is usually caused by bleeding from your esophagus or stomach. You may suddenly vomit bright red blood. You might also vomit old blood. It may look like coffee grounds. You may also have other symptoms, such as:  Stomach pain.  Heartburn.  Black and tarry stool. Follow these instructions at home: Watch your hematemesis for any changes. The following actions may help to lessen any discomfort you are feeling:  Take medicines only as directed by your health care provider. Do not take aspirin, ibuprofen, or any other anti-inflammatory medicine without approval from your health care provider.  Rest as needed.  Drink small sips of clear liquids often, as long as you can keep them down. Try to drink enough fluids to keep your urine clear or pale yellow.  Do not drink alcohol.  Do not use any tobacco products, including cigarettes, chewing tobacco, or electronic cigarettes. If you need help quitting, ask your health care provider.  Keep all follow-up visits as directed by your health care provider. This is important. Contact a health care provider if:  The vomiting of blood worsens, or begins again after it has stopped.  You have persistent stomach pain.  You have nausea, indigestion, or heartburn.  You feel weak or dizzy. Get help right away if:  You faint or feel extremely weak.  You have a rapid heartbeat.  You are urinating less than normal or not at all.  You have persistent vomiting.  You vomit large amounts of bloody or dark material.  You vomit bright red blood.  You pass large, dark, or bloody stools.  You have chest pain or trouble breathing. This information is not  intended to replace advice given to you by your health care provider. Make sure you discuss any questions you have with your health care provider. Document Released: 05/09/2004 Document Revised: 09/07/2015 Document Reviewed: 11/24/2013 Elsevier Interactive Patient Education  2017 [employer].

## 2016-07-28 NOTE — Progress Notes (Signed)
Pt got discharged to [facility], paperwork provided to [facility], IV DC, pt was non-tele, pt left with all of her belongings in a stable condition.

## 2016-07-28 NOTE — Clinical Social Work Note (Signed)
Clinical Social Worker facilitated patient discharge including contacting patient family and facility to confirm patient discharge plans. Clinical information faxed to facility and family agreeable with plan. CSW arranged ambulance transport via [facility] to [facility].  RN to call report prior to discharge.  Clinical Social Worker will sign off for now as social work intervention is no longer needed. Please consult [place] again if new need arises.  Karlisha Mathena B. Joline Maxcy Clinical Social Work Dept Weekend Social Worker [phone] 11:38 AM

## 2016-07-28 NOTE — Clinical Social Work Placement (Signed)
   CLINICAL SOCIAL WORK PLACEMENT  NOTE  Date:  07/28/2016  Patient Details  Name: Judith Zuniga MRN: [id number] Date of Birth: 06/27/1920  Clinical Social Work is seeking post-discharge placement for this patient at the [place] level of care (*CSW will initial, date and re-position this form in  chart as items are completed):  Yes   Patient/family provided with [facility] Work Department's list of facilities offering this level of care within the geographic area requested by the patient (or if unable, by the patient's family).  Yes   Patient/family informed of their freedom to choose among providers that offer the needed level of care, that participate in Medicare, Medicaid or managed care program needed by the patient, have an available bed and are willing to accept the patient.  Yes   Patient/family informed of [facility]'s ownership interest in [facility] and [facility], as well as of the fact that they are under no obligation to receive care at these facilities.  PASRR submitted to EDS on       PASRR number received on       Existing PASRR number confirmed on 07/28/16     FL2 transmitted to all facilities in geographic area requested by pt/family on       FL2 transmitted to all facilities within larger geographic area on       Patient informed that his/her managed care company has contracts with or will negotiate with certain facilities, including the following:        Yes   Patient/family informed of bed offers received.  Patient chooses bed at  (Pt from [facility])     Physician recommends and patient chooses bed at      Patient to be transferred to  (Pt from [facility]) on 07/28/16.  Patient to be transferred to facility by  Corey Harold)     Patient family notified on 07/28/16 of transfer.  Name of family member notified:  Dtr     PHYSICIAN Please sign FL2     Additional Comment:     _______________________________________________ Serafina Mitchell, LCSWA 07/28/2016, 11:50 AM

## 2016-07-30 ENCOUNTER — Other Ambulatory Visit: Payer: Self-pay | Admitting: *Deleted

## 2016-07-30 DIAGNOSIS — I10 Essential (primary) hypertension: Secondary | ICD-10-CM | POA: Diagnosis not present

## 2016-07-30 DIAGNOSIS — Z79899 Other long term (current) drug therapy: Secondary | ICD-10-CM | POA: Diagnosis not present

## 2016-07-30 DIAGNOSIS — E785 Hyperlipidemia, unspecified: Secondary | ICD-10-CM | POA: Diagnosis not present

## 2016-07-30 DIAGNOSIS — E559 Vitamin D deficiency, unspecified: Secondary | ICD-10-CM | POA: Diagnosis not present

## 2016-07-30 DIAGNOSIS — E119 Type 2 diabetes mellitus without complications: Secondary | ICD-10-CM | POA: Diagnosis not present

## 2016-07-30 DIAGNOSIS — E039 Hypothyroidism, unspecified: Secondary | ICD-10-CM | POA: Diagnosis not present

## 2016-07-30 DIAGNOSIS — D649 Anemia, unspecified: Secondary | ICD-10-CM | POA: Diagnosis not present

## 2016-07-30 NOTE — Patient Outreach (Signed)
[facility] Care Management Post-Acute Care Coordination  07/30/2016  Judith Zuniga 23-Dec-1920 [id number]   Met with Judith Fairy, LCSW for [facility]. Reviewed patient case. She confirms that patient is a [place] resident of facility and there are no plans to discharge at this time.  RNCM will sign off case.  Royetta Crochet. Laymond Purser, RN, BSN, [place] Post-Acute Care Coordinator [phone]

## 2016-07-31 DIAGNOSIS — I482 Chronic atrial fibrillation: Secondary | ICD-10-CM | POA: Diagnosis not present

## 2016-07-31 DIAGNOSIS — Z8673 Personal history of transient ischemic attack (TIA), and cerebral infarction without residual deficits: Secondary | ICD-10-CM | POA: Diagnosis not present

## 2016-07-31 DIAGNOSIS — K920 Hematemesis: Secondary | ICD-10-CM | POA: Diagnosis not present

## 2016-07-31 DIAGNOSIS — I5032 Chronic diastolic (congestive) heart failure: Secondary | ICD-10-CM | POA: Diagnosis not present

## 2016-08-02 DIAGNOSIS — F0390 Unspecified dementia without behavioral disturbance: Secondary | ICD-10-CM | POA: Diagnosis not present

## 2016-08-02 DIAGNOSIS — Z89511 Acquired absence of right leg below knee: Secondary | ICD-10-CM | POA: Diagnosis not present

## 2016-08-02 DIAGNOSIS — I639 Cerebral infarction, unspecified: Secondary | ICD-10-CM | POA: Diagnosis not present

## 2016-08-02 DIAGNOSIS — R040 Epistaxis: Secondary | ICD-10-CM | POA: Diagnosis not present

## 2016-08-02 DIAGNOSIS — I739 Peripheral vascular disease, unspecified: Secondary | ICD-10-CM | POA: Diagnosis not present

## 2016-08-02 DIAGNOSIS — I4891 Unspecified atrial fibrillation: Secondary | ICD-10-CM | POA: Diagnosis not present

## 2016-08-02 DIAGNOSIS — D649 Anemia, unspecified: Secondary | ICD-10-CM | POA: Diagnosis not present

## 2016-08-02 DIAGNOSIS — I10 Essential (primary) hypertension: Secondary | ICD-10-CM | POA: Diagnosis not present

## 2016-08-30 DIAGNOSIS — I509 Heart failure, unspecified: Secondary | ICD-10-CM | POA: Diagnosis not present

## 2016-08-30 DIAGNOSIS — E46 Unspecified protein-calorie malnutrition: Secondary | ICD-10-CM | POA: Diagnosis not present

## 2016-08-30 DIAGNOSIS — I4891 Unspecified atrial fibrillation: Secondary | ICD-10-CM | POA: Diagnosis not present

## 2016-08-30 DIAGNOSIS — I10 Essential (primary) hypertension: Secondary | ICD-10-CM | POA: Diagnosis not present

## 2016-08-30 DIAGNOSIS — Z89511 Acquired absence of right leg below knee: Secondary | ICD-10-CM | POA: Diagnosis not present

## 2016-08-30 DIAGNOSIS — F0391 Unspecified dementia with behavioral disturbance: Secondary | ICD-10-CM | POA: Diagnosis not present

## 2016-08-30 DIAGNOSIS — I2510 Atherosclerotic heart disease of native coronary artery without angina pectoris: Secondary | ICD-10-CM | POA: Diagnosis not present

## 2016-08-30 DIAGNOSIS — I739 Peripheral vascular disease, unspecified: Secondary | ICD-10-CM | POA: Diagnosis not present

## 2016-09-03 DIAGNOSIS — E119 Type 2 diabetes mellitus without complications: Secondary | ICD-10-CM | POA: Diagnosis not present

## 2016-09-03 DIAGNOSIS — I10 Essential (primary) hypertension: Secondary | ICD-10-CM | POA: Diagnosis not present

## 2016-09-03 DIAGNOSIS — I482 Chronic atrial fibrillation: Secondary | ICD-10-CM | POA: Diagnosis not present

## 2016-09-03 DIAGNOSIS — I5032 Chronic diastolic (congestive) heart failure: Secondary | ICD-10-CM | POA: Diagnosis not present

## 2016-09-04 DIAGNOSIS — D649 Anemia, unspecified: Secondary | ICD-10-CM | POA: Diagnosis not present

## 2016-10-04 DIAGNOSIS — I509 Heart failure, unspecified: Secondary | ICD-10-CM | POA: Diagnosis not present

## 2016-10-04 DIAGNOSIS — Z89511 Acquired absence of right leg below knee: Secondary | ICD-10-CM | POA: Diagnosis not present

## 2016-10-04 DIAGNOSIS — I4891 Unspecified atrial fibrillation: Secondary | ICD-10-CM | POA: Diagnosis not present

## 2016-10-04 DIAGNOSIS — I639 Cerebral infarction, unspecified: Secondary | ICD-10-CM | POA: Diagnosis not present

## 2016-10-04 DIAGNOSIS — I2510 Atherosclerotic heart disease of native coronary artery without angina pectoris: Secondary | ICD-10-CM | POA: Diagnosis not present

## 2016-10-04 DIAGNOSIS — F0391 Unspecified dementia with behavioral disturbance: Secondary | ICD-10-CM | POA: Diagnosis not present

## 2016-10-04 DIAGNOSIS — I739 Peripheral vascular disease, unspecified: Secondary | ICD-10-CM | POA: Diagnosis not present

## 2016-10-15 DIAGNOSIS — R062 Wheezing: Secondary | ICD-10-CM | POA: Diagnosis not present

## 2016-10-15 DIAGNOSIS — E119 Type 2 diabetes mellitus without complications: Secondary | ICD-10-CM | POA: Diagnosis not present

## 2016-10-23 DIAGNOSIS — I5040 Unspecified combined systolic (congestive) and diastolic (congestive) heart failure: Secondary | ICD-10-CM | POA: Diagnosis not present

## 2016-10-23 DIAGNOSIS — Z8673 Personal history of transient ischemic attack (TIA), and cerebral infarction without residual deficits: Secondary | ICD-10-CM | POA: Diagnosis not present

## 2016-10-23 DIAGNOSIS — E119 Type 2 diabetes mellitus without complications: Secondary | ICD-10-CM | POA: Diagnosis not present

## 2016-10-23 DIAGNOSIS — I482 Chronic atrial fibrillation: Secondary | ICD-10-CM | POA: Diagnosis not present

## 2016-10-23 DIAGNOSIS — I5032 Chronic diastolic (congestive) heart failure: Secondary | ICD-10-CM | POA: Diagnosis not present

## 2016-10-26 ENCOUNTER — Encounter (HOSPITAL_COMMUNITY): Payer: Self-pay

## 2016-10-26 ENCOUNTER — Emergency Department (HOSPITAL_COMMUNITY)
Admission: EM | Admit: 2016-10-26 | Discharge: 2016-10-26 | Disposition: A | Discharge status: Home / Self Care | Payer: Medicare Other | Attending: Emergency Medicine | Admitting: Emergency Medicine

## 2016-10-26 ENCOUNTER — Emergency Department (HOSPITAL_COMMUNITY): Payer: Medicare Other

## 2016-10-26 DIAGNOSIS — Z950 Presence of cardiac pacemaker: Secondary | ICD-10-CM | POA: Insufficient documentation

## 2016-10-26 DIAGNOSIS — M6281 Muscle weakness (generalized): Secondary | ICD-10-CM | POA: Diagnosis not present

## 2016-10-26 DIAGNOSIS — I5022 Chronic systolic (congestive) heart failure: Secondary | ICD-10-CM | POA: Insufficient documentation

## 2016-10-26 DIAGNOSIS — R531 Weakness: Secondary | ICD-10-CM | POA: Diagnosis not present

## 2016-10-26 DIAGNOSIS — I110 Hypertensive heart disease with heart failure: Secondary | ICD-10-CM | POA: Insufficient documentation

## 2016-10-26 DIAGNOSIS — Z7982 Long term (current) use of aspirin: Secondary | ICD-10-CM | POA: Diagnosis not present

## 2016-10-26 DIAGNOSIS — R4182 Altered mental status, unspecified: Secondary | ICD-10-CM | POA: Diagnosis not present

## 2016-10-26 DIAGNOSIS — Z951 Presence of aortocoronary bypass graft: Secondary | ICD-10-CM | POA: Diagnosis not present

## 2016-10-26 DIAGNOSIS — R1084 Generalized abdominal pain: Secondary | ICD-10-CM | POA: Diagnosis not present

## 2016-10-26 DIAGNOSIS — Z7984 Long term (current) use of oral hypoglycemic drugs: Secondary | ICD-10-CM | POA: Insufficient documentation

## 2016-10-26 DIAGNOSIS — E119 Type 2 diabetes mellitus without complications: Secondary | ICD-10-CM | POA: Diagnosis not present

## 2016-10-26 DIAGNOSIS — R404 Transient alteration of awareness: Secondary | ICD-10-CM | POA: Diagnosis not present

## 2016-10-26 DIAGNOSIS — Z8673 Personal history of transient ischemic attack (TIA), and cerebral infarction without residual deficits: Secondary | ICD-10-CM | POA: Insufficient documentation

## 2016-10-26 DIAGNOSIS — Z7901 Long term (current) use of anticoagulants: Secondary | ICD-10-CM | POA: Diagnosis not present

## 2016-10-26 DIAGNOSIS — R0989 Other specified symptoms and signs involving the circulatory and respiratory systems: Secondary | ICD-10-CM | POA: Diagnosis not present

## 2016-10-26 DIAGNOSIS — I2510 Atherosclerotic heart disease of native coronary artery without angina pectoris: Secondary | ICD-10-CM | POA: Diagnosis not present

## 2016-10-26 DIAGNOSIS — F0390 Unspecified dementia without behavioral disturbance: Secondary | ICD-10-CM | POA: Diagnosis not present

## 2016-10-26 DIAGNOSIS — Z79899 Other long term (current) drug therapy: Secondary | ICD-10-CM | POA: Insufficient documentation

## 2016-10-26 LAB — URINALYSIS, ROUTINE W REFLEX MICROSCOPIC
Bilirubin Urine: NEGATIVE
GLUCOSE, UA: NEGATIVE mg/dL
HGB URINE DIPSTICK: NEGATIVE
Ketones, ur: NEGATIVE mg/dL
Leukocytes, UA: NEGATIVE
Nitrite: NEGATIVE
PH: 5 (ref 5.0–8.0)
Protein, ur: NEGATIVE mg/dL
SPECIFIC GRAVITY, URINE: 1.01 (ref 1.005–1.030)

## 2016-10-26 LAB — CBC
HCT: 37.8 % (ref 36.0–46.0)
HEMOGLOBIN: 12 g/dL (ref 12.0–15.0)
MCH: 31.3 pg (ref 26.0–34.0)
MCHC: 31.7 g/dL (ref 30.0–36.0)
MCV: 98.4 fL (ref 78.0–100.0)
PLATELETS: 191 10*3/uL (ref 150–400)
RBC: 3.84 MIL/uL — ABNORMAL LOW (ref 3.87–5.11)
RDW: 13.5 % (ref 11.5–15.5)
WBC: 5.9 10*3/uL (ref 4.0–10.5)

## 2016-10-26 LAB — BASIC METABOLIC PANEL
ANION GAP: 8 (ref 5–15)
BUN: 19 mg/dL (ref 6–20)
CALCIUM: 9 mg/dL (ref 8.9–10.3)
CO2: 27 mmol/L (ref 22–32)
CREATININE: 0.95 mg/dL (ref 0.44–1.00)
Chloride: 104 mmol/L (ref 101–111)
GFR calc Af Amer: 58 mL/min — ABNORMAL LOW (ref >60)
GFR, EST NON AFRICAN AMERICAN: 50 mL/min — AB (ref >60)
GLUCOSE: 207 mg/dL — AB (ref 65–99)
Potassium: 3.8 mmol/L (ref 3.5–5.1)
Sodium: 139 mmol/L (ref 135–145)

## 2016-10-26 LAB — I-STAT TROPONIN, ED: Troponin i, poc: 0.02 ng/mL (ref 0.00–0.08)

## 2016-10-26 NOTE — ED Notes (Signed)
At time of discharge, [facility] left patient's paperwork in ED. Rinky nurse at [facility] called for copy of paperwork. Faxed discharge paperwork to [facility] 3 separate times and at 2 different numbers at Rinky's request.

## 2016-10-26 NOTE — ED Notes (Signed)
Pt daughter called with update, on her way back from cafeteria.

## 2016-10-26 NOTE — Discharge Instructions (Signed)
It was our pleasure to provide your ER care today - we hope that you feel better.  Rest. Drink adequate fluids.    Eat meals regularly - consider supplementing nutrition with Boost or Ensure shakes.   Follow up with primary care doctor in the next couple days for recheck.  Return to ER if worse, new symptoms, fevers, trouble breathing, other concern.

## 2016-10-26 NOTE — ED Provider Notes (Signed)
[facility] DEPT Provider Note   CSN: [id number] Arrival date & time: 10/26/16  1551     History   Chief Complaint Chief Complaint  Patient presents with  . Weakness    HPI Judith Zuniga is a 83 y.o. female.  Patient with hx dementia, afib/pacemaker, presents w generalized weakness in past week. Symptoms gradual onset, constant, persistent. No abrupt change this afternoon. Pt alert, content. Pt denies specific c/o - level 5 caveat, dementia. Denies fall/trauma. No headache. No chest pain or sob. No cough or uri c/o. No abd pain or nvd. Ate very little today. At baseline is non ambulatory, in bed or chair. Family member indicates current mental status c/w baseline. No recent change in meds.    The history is provided by the patient, the EMS personnel and a relative. The history is limited by the condition of the patient.  Weakness     Past Medical History:  Diagnosis Date  . Atrioventricular block, complete s/p AV ablation   . CAD (coronary artery disease)    s/p redo bypass surgery in 1998 with patent graft in Jan. 2009.   Marland Kitchen Dementia   . Diabetes mellitus   . Elevated liver function tests    on amiodarone  . Hyperlipidemia   . Hypertension   . Ischemic cardiomyopathy    ischemic heart myopathy , ejection fraction of 25%  . Pacemaker -CRT- [employer] Judes   . Permanent atrial fibrillation   . Premature ventricular contractions    frequent  . Systolic heart failure    class II and euvolemic  . Type II or unspecified type diabetes mellitus without mention of complication, not stated as uncontrolled     Patient Active Problem List   Diagnosis Date Noted  . Hematemesis 07/27/2016  . Stroke ([place])   . HLD (hyperlipidemia)   . Delirium 05/05/2014  . History of stroke 05/05/2014  . Ischemic cardiomyopathy 05/05/2014  . Chronic atrial fibrillation ([place]) 05/05/2014  . Diabetes mellitus 05/05/2014  . Systolic heart failure 30/86/5784  . Chronic systolic heart failure ([place])    . DM (diabetes mellitus) type II uncontrolled, periph vascular disorder ([place])   . Orthostatic hypotension 07/28/2013  . Encounter for therapeutic drug monitoring 07/13/2013  . Multiple pulmonary nodules 12/13/2011  . Atherosclerosis of native arteries of the extremities with ulceration ([place]) 07/09/2011  . Syncope 05/01/2011  . Diastolic CHF, chronic ([place]) 10/08/2010  . Mitral regurgitation 10/08/2010  . Long term current use of anticoagulant 05/16/2010  . ATAXIA 11/14/2009  . CAROTID ARTERY DISEASE 10/17/2009  . AODM 04/09/2008  . Hyperlipidemia 04/09/2008  . Benign hypertensive heart disease with heart failure ([place]) 04/09/2008  . CAD, ARTERY BYPASS GRAFT 04/09/2008  . Atrial fibrillation ([place]) 04/09/2008  . PACEMAKER, CRT-St. Jude 04/09/2008    Past Surgical History:  Procedure Laterality Date  . AMPUTATION Right 05/11/2014   Procedure: AMPUTATION BELOW KNEE;  Surgeon: Newt Minion, MD;  Location: [facility];  Service: Orthopedics;  Laterality: Right;  . cardiac caths     multile  . CORONARY ARTERY BYPASS GRAFT  1998   CABG x5: RIMA to diagonal one, RIMA to diagonal 2, SVG to PD, SVG to PL, SVG to circ  . CORONARY ARTERY BYPASS GRAFT  1985   x3: LIMA to LAD, SVG to PD and SVG to PL  . pacer generator change out     St. Jude pacemaker    OB History    No data available  Home Medications    Prior to Admission medications   Medication Sig Start Date End Date Taking? Authorizing Provider  apixaban (ELIQUIS) 2.5 MG TABS tablet Take 2.5 mg by mouth 2 (two) times daily.    [provider]  aspirin EC 81 MG EC tablet Take 1 tablet (81 mg total) by mouth daily. 05/15/14   Caren Griffins, MD  atorvastatin (LIPITOR) 40 MG tablet Take 40 mg by mouth daily.    [provider]  bisoprolol (ZEBETA) 10 MG tablet Take 1 tablet (10 mg total) by mouth daily. 12/15/13   Larey Dresser, MD  Cholecalciferol (VITAMIN D-3) 1000 UNITS CAPS Take 2 capsules by mouth daily.     [provider]  ferrous sulfate 325 (65 FE) MG tablet Take 325 mg by mouth 2 (two) times daily with a meal.    [provider]  furosemide (LASIX) 40 MG tablet TAKE1/2 TABLET BY MOUTH EVERY DAY 02/16/14   Larey Dresser, MD  glimepiride (AMARYL) 1 MG tablet Take 1 mg by mouth daily with breakfast.  06/12/13   [provider]  HYDROcodone-acetaminophen (NORCO/VICODIN) 5-325 MG per tablet Take 1 tablet by mouth every 6 (six) hours as needed for moderate pain. 05/15/14   Caren Griffins, MD  losartan (COZAAR) 50 MG tablet Take 50 mg by mouth daily.  08/23/11   Larey Dresser, MD  Memantine HCl ER (NAMENDA XR) 28 MG CP24 Take 1 capsule by mouth daily.    [provider]  nitroGLYCERIN (NITRODUR - DOSED IN MG/24 HR) 0.2 mg/hr patch Place 0.2 mg onto the skin daily.    [provider]  pentoxifylline (TRENTAL) 400 MG CR tablet Take 400 mg by mouth 3 (three) times daily with meals.    [provider]  potassium chloride (K-DUR) 10 MEQ tablet TAKE ONE TABLET BY MOUTH ONCE DAILY 01/06/14   Larey Dresser, MD  sertraline (ZOLOFT) 50 MG tablet Take 50 mg by mouth daily.    [provider]    Family History Family History  Problem Relation Age of Onset  . Other Mother        pacemaker  . Heart disease Father   . Cancer Sister        gallbladder    Social History Social History  Substance Use Topics  . Smoking status: Never Smoker  . Smokeless tobacco: Never Used  . Alcohol use No     Allergies   Patient has no known allergies.   Review of Systems Review of Systems  Unable to perform ROS: Dementia  Constitutional: Negative for fever.  Eyes: Eye discharge:    Neurological: Positive for weakness.  level 5 caveat - dementia   Physical Exam Updated Vital Signs BP 136/81 (BP Location: Right Arm)   Pulse 78   Temp 97.8 F (36.6 C) (Oral)   Resp 18   Ht 1.6 m (5\' 3" )   Wt 46.4 kg (102 lb 3.2 oz)   SpO2 96%   BMI  18.10 kg/m   Physical Exam  Constitutional: She appears well-developed and well-nourished. No distress.  HENT:  Head: Atraumatic.  Mouth/Throat: Oropharynx is clear and moist.  Eyes: Pupils are equal, round, and reactive to light. Conjunctivae are normal. No scleral icterus.  Neck: Neck supple. No tracheal deviation present. No thyromegaly present.  No bruits. No stiffness or rigidity.   Cardiovascular: Normal rate, regular rhythm, normal heart sounds and intact distal pulses.  Exam reveals no gallop and  no friction rub.   No murmur heard. Pulmonary/Chest: Effort normal and breath sounds normal. No respiratory distress.  Abdominal: Soft. Normal appearance and bowel sounds are normal. She exhibits no distension. There is no tenderness.  Genitourinary:  Genitourinary Comments: No cva tenderness  Musculoskeletal: She exhibits no edema.  RLE amputee. Left leg, no edema.   Neurological: She is alert.  Alert, content appearing. Speech fluent. Confused/hx dementia - mental status noted c/w baseline. Moves bil ext purposefully. No focal weakness noted.   Skin: Skin is warm and dry. No rash noted. She is not diaphoretic.  Psychiatric: She has a normal mood and affect.  Nursing note and vitals reviewed.    ED Treatments / Results  Labs (all labs ordered are listed, but only abnormal results are displayed) Results for orders placed or performed during the hospital encounter of 88/41/66  Basic metabolic panel  Result Value Ref Range   Sodium 139 135 - 145 mmol/L   Potassium 3.8 3.5 - 5.1 mmol/L   Chloride 104 101 - 111 mmol/L   CO2 27 22 - 32 mmol/L   Glucose, Bld 207 (H) 65 - 99 mg/dL   BUN 19 6 - 20 mg/dL   Creatinine, Ser 0.95 0.44 - 1.00 mg/dL   Calcium 9.0 8.9 - 10.3 mg/dL   GFR calc non Af Amer 50 (L) >60 mL/min   GFR calc Af Amer 58 (L) >60 mL/min   Anion gap 8 5 - 15  CBC  Result Value Ref Range   WBC 5.9 4.0 - 10.5 K/uL   RBC 3.84 (L) 3.87 - 5.11 MIL/uL   Hemoglobin 12.0  12.0 - 15.0 g/dL   HCT 37.8 36.0 - 46.0 %   MCV 98.4 78.0 - 100.0 fL   MCH 31.3 26.0 - 34.0 pg   MCHC 31.7 30.0 - 36.0 g/dL   RDW 13.5 11.5 - 15.5 %   Platelets 191 150 - 400 K/uL  Urinalysis, Routine w reflex microscopic  Result Value Ref Range   Color, Urine STRAW (A) YELLOW   APPearance CLEAR CLEAR   Specific Gravity, Urine 1.010 1.005 - 1.030   pH 5.0 5.0 - 8.0   Glucose, UA NEGATIVE NEGATIVE mg/dL   Hgb urine dipstick NEGATIVE NEGATIVE   Bilirubin Urine NEGATIVE NEGATIVE   Ketones, ur NEGATIVE NEGATIVE mg/dL   Protein, ur NEGATIVE NEGATIVE mg/dL   Nitrite NEGATIVE NEGATIVE   Leukocytes, UA NEGATIVE NEGATIVE  I-stat troponin, ED  Result Value Ref Range   Troponin i, poc 0.02 0.00 - 0.08 ng/mL   Comment 3           Dg Chest Port 1 View  Result Date: 10/26/2016 CLINICAL DATA:  Weakness, congestion EXAM: PORTABLE CHEST 1 VIEW COMPARISON:  07/26/2016 FINDINGS: Prior CABG. Left pacer is unchanged. Heart is upper limits normal in size. No confluent opacities or effusions. No acute bony abnormality. IMPRESSION: No active disease. Electronically Signed   By: Rolm Baptise M.D.   On: 10/26/2016 16:35    EKG  EKG Interpretation  Date/Time:  Saturday October 26 2016 15:55:47 EDT Ventricular Rate:  77 PR Interval:    QRS Duration: 139 QT Interval:  431 QTC Calculation: 488 R Axis:   -90 Text Interpretation:  Electronic ventricular pacemaker Confirmed by Lajean Saver [phone]) on 10/26/2016 4:02:14 PM       Radiology No results found.  Procedures Procedures (including critical care time)  Medications Ordered in ED Medications - No data to display   Initial Impression /  Assessment and Plan / ED Course  I have reviewed the triage vital signs and the nursing notes.  Pertinent labs & imaging results that were available during my care of the patient were reviewed by me and considered in my medical decision making (see chart for details).  Iv ns.   Labs sent.  Reviewed  nursing notes and prior charts for additional history.   Recheck, pt states feels fine, no new c/o. Mental status c/w baseline.  Pt currently appears stable for d/c.     Final Clinical Impressions(s) / ED Diagnoses   Final diagnoses:  None    New Prescriptions New Prescriptions   No medications on file     Lajean Saver, MD 10/26/16 [phone]

## 2016-10-26 NOTE — ED Notes (Addendum)
Pt daughter to step out of room, will call to update pt daughter when provider reassess pt.   [name] [place]: [phone]

## 2016-10-26 NOTE — ED Notes (Signed)
ED Provider at bedside. 

## 2016-10-26 NOTE — ED Notes (Signed)
Pt d/c instructions left on nursing station, pt other paperwork transported with pt. [facility] called, d/c instructions faxed to [facility].

## 2016-10-26 NOTE — ED Triage Notes (Signed)
Pt with hx of dementia from [facility] via EMS for weakness x 1 week. EMS called by family who reports pt has had increased confusion, weakness, lack of appetite, and a wet cough for a little over a week. Pt with hx of CHF with recent change in Lasix dosage. Pt family reports productive cough with frothy sputum noted yesterday. Per EMS, recent CXR showed CHF with no acute changes. Pt on 2L O2 at baseline, facility reported pt sat of 88% on 2 L. O2 saturation 95% on RA for this nurse, 99% on 2L. NAD noted. 84 bpm, 146/73, 186 CBG. Pt alert to self at baseline.

## 2016-11-08 DIAGNOSIS — Z89511 Acquired absence of right leg below knee: Secondary | ICD-10-CM | POA: Diagnosis not present

## 2016-11-08 DIAGNOSIS — E46 Unspecified protein-calorie malnutrition: Secondary | ICD-10-CM | POA: Diagnosis not present

## 2016-11-08 DIAGNOSIS — I739 Peripheral vascular disease, unspecified: Secondary | ICD-10-CM | POA: Diagnosis not present

## 2016-11-08 DIAGNOSIS — I639 Cerebral infarction, unspecified: Secondary | ICD-10-CM | POA: Diagnosis not present

## 2016-11-08 DIAGNOSIS — I10 Essential (primary) hypertension: Secondary | ICD-10-CM | POA: Diagnosis not present

## 2016-11-08 DIAGNOSIS — I509 Heart failure, unspecified: Secondary | ICD-10-CM | POA: Diagnosis not present

## 2016-11-08 DIAGNOSIS — F0391 Unspecified dementia with behavioral disturbance: Secondary | ICD-10-CM | POA: Diagnosis not present

## 2016-11-25 DIAGNOSIS — Z9181 History of falling: Secondary | ICD-10-CM | POA: Diagnosis not present

## 2016-11-25 DIAGNOSIS — M6281 Muscle weakness (generalized): Secondary | ICD-10-CM | POA: Diagnosis not present

## 2016-11-25 DIAGNOSIS — R278 Other lack of coordination: Secondary | ICD-10-CM | POA: Diagnosis not present

## 2016-11-25 DIAGNOSIS — K920 Hematemesis: Secondary | ICD-10-CM | POA: Diagnosis not present

## 2016-11-25 DIAGNOSIS — R293 Abnormal posture: Secondary | ICD-10-CM | POA: Diagnosis not present

## 2016-11-25 DIAGNOSIS — Z89511 Acquired absence of right leg below knee: Secondary | ICD-10-CM | POA: Diagnosis not present

## 2016-11-26 DIAGNOSIS — K920 Hematemesis: Secondary | ICD-10-CM | POA: Diagnosis not present

## 2016-11-26 DIAGNOSIS — Z9181 History of falling: Secondary | ICD-10-CM | POA: Diagnosis not present

## 2016-11-26 DIAGNOSIS — Z89511 Acquired absence of right leg below knee: Secondary | ICD-10-CM | POA: Diagnosis not present

## 2016-11-26 DIAGNOSIS — R293 Abnormal posture: Secondary | ICD-10-CM | POA: Diagnosis not present

## 2016-11-26 DIAGNOSIS — R278 Other lack of coordination: Secondary | ICD-10-CM | POA: Diagnosis not present

## 2016-11-26 DIAGNOSIS — M6281 Muscle weakness (generalized): Secondary | ICD-10-CM | POA: Diagnosis not present

## 2016-11-27 DIAGNOSIS — Z9181 History of falling: Secondary | ICD-10-CM | POA: Diagnosis not present

## 2016-11-27 DIAGNOSIS — M6281 Muscle weakness (generalized): Secondary | ICD-10-CM | POA: Diagnosis not present

## 2016-11-27 DIAGNOSIS — K920 Hematemesis: Secondary | ICD-10-CM | POA: Diagnosis not present

## 2016-11-27 DIAGNOSIS — R293 Abnormal posture: Secondary | ICD-10-CM | POA: Diagnosis not present

## 2016-11-27 DIAGNOSIS — Z89511 Acquired absence of right leg below knee: Secondary | ICD-10-CM | POA: Diagnosis not present

## 2016-11-27 DIAGNOSIS — R278 Other lack of coordination: Secondary | ICD-10-CM | POA: Diagnosis not present

## 2016-11-28 DIAGNOSIS — R278 Other lack of coordination: Secondary | ICD-10-CM | POA: Diagnosis not present

## 2016-11-28 DIAGNOSIS — M6281 Muscle weakness (generalized): Secondary | ICD-10-CM | POA: Diagnosis not present

## 2016-11-28 DIAGNOSIS — R293 Abnormal posture: Secondary | ICD-10-CM | POA: Diagnosis not present

## 2016-11-28 DIAGNOSIS — K920 Hematemesis: Secondary | ICD-10-CM | POA: Diagnosis not present

## 2016-11-28 DIAGNOSIS — Z9181 History of falling: Secondary | ICD-10-CM | POA: Diagnosis not present

## 2016-11-28 DIAGNOSIS — Z89511 Acquired absence of right leg below knee: Secondary | ICD-10-CM | POA: Diagnosis not present

## 2016-11-29 DIAGNOSIS — R293 Abnormal posture: Secondary | ICD-10-CM | POA: Diagnosis not present

## 2016-11-29 DIAGNOSIS — I10 Essential (primary) hypertension: Secondary | ICD-10-CM | POA: Diagnosis not present

## 2016-11-29 DIAGNOSIS — I509 Heart failure, unspecified: Secondary | ICD-10-CM | POA: Diagnosis not present

## 2016-11-29 DIAGNOSIS — Z9181 History of falling: Secondary | ICD-10-CM | POA: Diagnosis not present

## 2016-11-29 DIAGNOSIS — I739 Peripheral vascular disease, unspecified: Secondary | ICD-10-CM | POA: Diagnosis not present

## 2016-11-29 DIAGNOSIS — I2510 Atherosclerotic heart disease of native coronary artery without angina pectoris: Secondary | ICD-10-CM | POA: Diagnosis not present

## 2016-11-29 DIAGNOSIS — K920 Hematemesis: Secondary | ICD-10-CM | POA: Diagnosis not present

## 2016-11-29 DIAGNOSIS — M6281 Muscle weakness (generalized): Secondary | ICD-10-CM | POA: Diagnosis not present

## 2016-11-29 DIAGNOSIS — F0391 Unspecified dementia with behavioral disturbance: Secondary | ICD-10-CM | POA: Diagnosis not present

## 2016-11-29 DIAGNOSIS — I639 Cerebral infarction, unspecified: Secondary | ICD-10-CM | POA: Diagnosis not present

## 2016-11-29 DIAGNOSIS — Z89511 Acquired absence of right leg below knee: Secondary | ICD-10-CM | POA: Diagnosis not present

## 2016-11-29 DIAGNOSIS — R278 Other lack of coordination: Secondary | ICD-10-CM | POA: Diagnosis not present

## 2016-12-02 DIAGNOSIS — M6281 Muscle weakness (generalized): Secondary | ICD-10-CM | POA: Diagnosis not present

## 2016-12-02 DIAGNOSIS — K920 Hematemesis: Secondary | ICD-10-CM | POA: Diagnosis not present

## 2016-12-02 DIAGNOSIS — Z89511 Acquired absence of right leg below knee: Secondary | ICD-10-CM | POA: Diagnosis not present

## 2016-12-02 DIAGNOSIS — R293 Abnormal posture: Secondary | ICD-10-CM | POA: Diagnosis not present

## 2016-12-02 DIAGNOSIS — R278 Other lack of coordination: Secondary | ICD-10-CM | POA: Diagnosis not present

## 2016-12-02 DIAGNOSIS — Z9181 History of falling: Secondary | ICD-10-CM | POA: Diagnosis not present

## 2016-12-03 DIAGNOSIS — Z9181 History of falling: Secondary | ICD-10-CM | POA: Diagnosis not present

## 2016-12-03 DIAGNOSIS — R293 Abnormal posture: Secondary | ICD-10-CM | POA: Diagnosis not present

## 2016-12-03 DIAGNOSIS — Z89511 Acquired absence of right leg below knee: Secondary | ICD-10-CM | POA: Diagnosis not present

## 2016-12-03 DIAGNOSIS — M6281 Muscle weakness (generalized): Secondary | ICD-10-CM | POA: Diagnosis not present

## 2016-12-03 DIAGNOSIS — K920 Hematemesis: Secondary | ICD-10-CM | POA: Diagnosis not present

## 2016-12-03 DIAGNOSIS — R278 Other lack of coordination: Secondary | ICD-10-CM | POA: Diagnosis not present

## 2016-12-04 DIAGNOSIS — Z89511 Acquired absence of right leg below knee: Secondary | ICD-10-CM | POA: Diagnosis not present

## 2016-12-04 DIAGNOSIS — K920 Hematemesis: Secondary | ICD-10-CM | POA: Diagnosis not present

## 2016-12-04 DIAGNOSIS — R278 Other lack of coordination: Secondary | ICD-10-CM | POA: Diagnosis not present

## 2016-12-04 DIAGNOSIS — R293 Abnormal posture: Secondary | ICD-10-CM | POA: Diagnosis not present

## 2016-12-04 DIAGNOSIS — Z9181 History of falling: Secondary | ICD-10-CM | POA: Diagnosis not present

## 2016-12-04 DIAGNOSIS — M6281 Muscle weakness (generalized): Secondary | ICD-10-CM | POA: Diagnosis not present

## 2016-12-05 DIAGNOSIS — R278 Other lack of coordination: Secondary | ICD-10-CM | POA: Diagnosis not present

## 2016-12-05 DIAGNOSIS — R293 Abnormal posture: Secondary | ICD-10-CM | POA: Diagnosis not present

## 2016-12-05 DIAGNOSIS — K920 Hematemesis: Secondary | ICD-10-CM | POA: Diagnosis not present

## 2016-12-05 DIAGNOSIS — Z89511 Acquired absence of right leg below knee: Secondary | ICD-10-CM | POA: Diagnosis not present

## 2016-12-05 DIAGNOSIS — M6281 Muscle weakness (generalized): Secondary | ICD-10-CM | POA: Diagnosis not present

## 2016-12-05 DIAGNOSIS — Z9181 History of falling: Secondary | ICD-10-CM | POA: Diagnosis not present

## 2016-12-08 DIAGNOSIS — K920 Hematemesis: Secondary | ICD-10-CM | POA: Diagnosis not present

## 2016-12-08 DIAGNOSIS — R293 Abnormal posture: Secondary | ICD-10-CM | POA: Diagnosis not present

## 2016-12-08 DIAGNOSIS — Z89511 Acquired absence of right leg below knee: Secondary | ICD-10-CM | POA: Diagnosis not present

## 2016-12-08 DIAGNOSIS — R278 Other lack of coordination: Secondary | ICD-10-CM | POA: Diagnosis not present

## 2016-12-08 DIAGNOSIS — Z9181 History of falling: Secondary | ICD-10-CM | POA: Diagnosis not present

## 2016-12-08 DIAGNOSIS — M6281 Muscle weakness (generalized): Secondary | ICD-10-CM | POA: Diagnosis not present

## 2016-12-09 DIAGNOSIS — R293 Abnormal posture: Secondary | ICD-10-CM | POA: Diagnosis not present

## 2016-12-09 DIAGNOSIS — R278 Other lack of coordination: Secondary | ICD-10-CM | POA: Diagnosis not present

## 2016-12-09 DIAGNOSIS — Z9181 History of falling: Secondary | ICD-10-CM | POA: Diagnosis not present

## 2016-12-09 DIAGNOSIS — K920 Hematemesis: Secondary | ICD-10-CM | POA: Diagnosis not present

## 2016-12-09 DIAGNOSIS — Z89511 Acquired absence of right leg below knee: Secondary | ICD-10-CM | POA: Diagnosis not present

## 2016-12-09 DIAGNOSIS — M6281 Muscle weakness (generalized): Secondary | ICD-10-CM | POA: Diagnosis not present

## 2016-12-11 DIAGNOSIS — R293 Abnormal posture: Secondary | ICD-10-CM | POA: Diagnosis not present

## 2016-12-11 DIAGNOSIS — M6281 Muscle weakness (generalized): Secondary | ICD-10-CM | POA: Diagnosis not present

## 2016-12-11 DIAGNOSIS — Z9181 History of falling: Secondary | ICD-10-CM | POA: Diagnosis not present

## 2016-12-11 DIAGNOSIS — K920 Hematemesis: Secondary | ICD-10-CM | POA: Diagnosis not present

## 2016-12-11 DIAGNOSIS — R278 Other lack of coordination: Secondary | ICD-10-CM | POA: Diagnosis not present

## 2016-12-11 DIAGNOSIS — Z89511 Acquired absence of right leg below knee: Secondary | ICD-10-CM | POA: Diagnosis not present

## 2016-12-12 DIAGNOSIS — Z9181 History of falling: Secondary | ICD-10-CM | POA: Diagnosis not present

## 2016-12-12 DIAGNOSIS — Z89511 Acquired absence of right leg below knee: Secondary | ICD-10-CM | POA: Diagnosis not present

## 2016-12-12 DIAGNOSIS — M6281 Muscle weakness (generalized): Secondary | ICD-10-CM | POA: Diagnosis not present

## 2016-12-12 DIAGNOSIS — K920 Hematemesis: Secondary | ICD-10-CM | POA: Diagnosis not present

## 2016-12-12 DIAGNOSIS — R293 Abnormal posture: Secondary | ICD-10-CM | POA: Diagnosis not present

## 2016-12-12 DIAGNOSIS — R278 Other lack of coordination: Secondary | ICD-10-CM | POA: Diagnosis not present

## 2016-12-13 DIAGNOSIS — Z89511 Acquired absence of right leg below knee: Secondary | ICD-10-CM | POA: Diagnosis not present

## 2016-12-13 DIAGNOSIS — R278 Other lack of coordination: Secondary | ICD-10-CM | POA: Diagnosis not present

## 2016-12-13 DIAGNOSIS — K920 Hematemesis: Secondary | ICD-10-CM | POA: Diagnosis not present

## 2016-12-13 DIAGNOSIS — R293 Abnormal posture: Secondary | ICD-10-CM | POA: Diagnosis not present

## 2016-12-13 DIAGNOSIS — Z9181 History of falling: Secondary | ICD-10-CM | POA: Diagnosis not present

## 2016-12-13 DIAGNOSIS — M6281 Muscle weakness (generalized): Secondary | ICD-10-CM | POA: Diagnosis not present

## 2017-01-03 DIAGNOSIS — I5032 Chronic diastolic (congestive) heart failure: Secondary | ICD-10-CM | POA: Diagnosis not present

## 2017-01-03 DIAGNOSIS — I482 Chronic atrial fibrillation: Secondary | ICD-10-CM | POA: Diagnosis not present

## 2017-01-03 DIAGNOSIS — F0390 Unspecified dementia without behavioral disturbance: Secondary | ICD-10-CM | POA: Diagnosis not present

## 2017-01-03 DIAGNOSIS — R05 Cough: Secondary | ICD-10-CM | POA: Diagnosis not present

## 2017-01-03 DIAGNOSIS — E119 Type 2 diabetes mellitus without complications: Secondary | ICD-10-CM | POA: Diagnosis not present

## 2017-01-10 DIAGNOSIS — Z89511 Acquired absence of right leg below knee: Secondary | ICD-10-CM | POA: Diagnosis not present

## 2017-01-10 DIAGNOSIS — I739 Peripheral vascular disease, unspecified: Secondary | ICD-10-CM | POA: Diagnosis not present

## 2017-01-10 DIAGNOSIS — I639 Cerebral infarction, unspecified: Secondary | ICD-10-CM | POA: Diagnosis not present

## 2017-01-10 DIAGNOSIS — I4891 Unspecified atrial fibrillation: Secondary | ICD-10-CM | POA: Diagnosis not present

## 2017-01-10 DIAGNOSIS — I2510 Atherosclerotic heart disease of native coronary artery without angina pectoris: Secondary | ICD-10-CM | POA: Diagnosis not present

## 2017-01-10 DIAGNOSIS — I509 Heart failure, unspecified: Secondary | ICD-10-CM | POA: Diagnosis not present

## 2017-01-10 DIAGNOSIS — E46 Unspecified protein-calorie malnutrition: Secondary | ICD-10-CM | POA: Diagnosis not present

## 2017-01-10 DIAGNOSIS — F0391 Unspecified dementia with behavioral disturbance: Secondary | ICD-10-CM | POA: Diagnosis not present

## 2017-01-13 DIAGNOSIS — I10 Essential (primary) hypertension: Secondary | ICD-10-CM | POA: Diagnosis not present

## 2017-01-13 DIAGNOSIS — E785 Hyperlipidemia, unspecified: Secondary | ICD-10-CM | POA: Diagnosis not present

## 2017-01-13 DIAGNOSIS — Z79899 Other long term (current) drug therapy: Secondary | ICD-10-CM | POA: Diagnosis not present

## 2017-01-13 DIAGNOSIS — D649 Anemia, unspecified: Secondary | ICD-10-CM | POA: Diagnosis not present

## 2017-01-13 DIAGNOSIS — E119 Type 2 diabetes mellitus without complications: Secondary | ICD-10-CM | POA: Diagnosis not present

## 2017-01-31 DIAGNOSIS — Z89511 Acquired absence of right leg below knee: Secondary | ICD-10-CM | POA: Diagnosis not present

## 2017-01-31 DIAGNOSIS — I10 Essential (primary) hypertension: Secondary | ICD-10-CM | POA: Diagnosis not present

## 2017-01-31 DIAGNOSIS — I4891 Unspecified atrial fibrillation: Secondary | ICD-10-CM | POA: Diagnosis not present

## 2017-01-31 DIAGNOSIS — I2510 Atherosclerotic heart disease of native coronary artery without angina pectoris: Secondary | ICD-10-CM | POA: Diagnosis not present

## 2017-01-31 DIAGNOSIS — F0391 Unspecified dementia with behavioral disturbance: Secondary | ICD-10-CM | POA: Diagnosis not present

## 2017-01-31 DIAGNOSIS — I509 Heart failure, unspecified: Secondary | ICD-10-CM | POA: Diagnosis not present

## 2017-01-31 DIAGNOSIS — E46 Unspecified protein-calorie malnutrition: Secondary | ICD-10-CM | POA: Diagnosis not present

## 2017-01-31 DIAGNOSIS — I739 Peripheral vascular disease, unspecified: Secondary | ICD-10-CM | POA: Diagnosis not present

## 2017-02-06 DIAGNOSIS — Z7984 Long term (current) use of oral hypoglycemic drugs: Secondary | ICD-10-CM | POA: Diagnosis not present

## 2017-02-06 DIAGNOSIS — E1140 Type 2 diabetes mellitus with diabetic neuropathy, unspecified: Secondary | ICD-10-CM | POA: Diagnosis not present

## 2017-02-06 DIAGNOSIS — B351 Tinea unguium: Secondary | ICD-10-CM | POA: Diagnosis not present

## 2017-02-28 DIAGNOSIS — Z89511 Acquired absence of right leg below knee: Secondary | ICD-10-CM | POA: Diagnosis not present

## 2017-02-28 DIAGNOSIS — F0391 Unspecified dementia with behavioral disturbance: Secondary | ICD-10-CM | POA: Diagnosis not present

## 2017-02-28 DIAGNOSIS — I639 Cerebral infarction, unspecified: Secondary | ICD-10-CM | POA: Diagnosis not present

## 2017-02-28 DIAGNOSIS — I482 Chronic atrial fibrillation: Secondary | ICD-10-CM | POA: Diagnosis not present

## 2017-02-28 DIAGNOSIS — I10 Essential (primary) hypertension: Secondary | ICD-10-CM | POA: Diagnosis not present

## 2017-02-28 DIAGNOSIS — I2510 Atherosclerotic heart disease of native coronary artery without angina pectoris: Secondary | ICD-10-CM | POA: Diagnosis not present

## 2017-02-28 DIAGNOSIS — I739 Peripheral vascular disease, unspecified: Secondary | ICD-10-CM | POA: Diagnosis not present

## 2017-02-28 DIAGNOSIS — E46 Unspecified protein-calorie malnutrition: Secondary | ICD-10-CM | POA: Diagnosis not present

## 2017-02-28 DIAGNOSIS — I509 Heart failure, unspecified: Secondary | ICD-10-CM | POA: Diagnosis not present

## 2017-03-13 DIAGNOSIS — E039 Hypothyroidism, unspecified: Secondary | ICD-10-CM | POA: Diagnosis not present

## 2017-03-13 DIAGNOSIS — D68318 Other hemorrhagic disorder due to intrinsic circulating anticoagulants, antibodies, or inhibitors: Secondary | ICD-10-CM | POA: Diagnosis not present

## 2017-03-13 DIAGNOSIS — D689 Coagulation defect, unspecified: Secondary | ICD-10-CM | POA: Diagnosis not present

## 2017-03-13 DIAGNOSIS — D649 Anemia, unspecified: Secondary | ICD-10-CM | POA: Diagnosis not present

## 2017-03-28 DIAGNOSIS — I639 Cerebral infarction, unspecified: Secondary | ICD-10-CM | POA: Diagnosis not present

## 2017-03-28 DIAGNOSIS — I10 Essential (primary) hypertension: Secondary | ICD-10-CM | POA: Diagnosis not present

## 2017-03-28 DIAGNOSIS — Z89511 Acquired absence of right leg below knee: Secondary | ICD-10-CM | POA: Diagnosis not present

## 2017-03-28 DIAGNOSIS — I739 Peripheral vascular disease, unspecified: Secondary | ICD-10-CM | POA: Diagnosis not present

## 2017-03-28 DIAGNOSIS — F0391 Unspecified dementia with behavioral disturbance: Secondary | ICD-10-CM | POA: Diagnosis not present

## 2017-03-28 DIAGNOSIS — E1151 Type 2 diabetes mellitus with diabetic peripheral angiopathy without gangrene: Secondary | ICD-10-CM | POA: Diagnosis not present

## 2017-03-28 DIAGNOSIS — I2510 Atherosclerotic heart disease of native coronary artery without angina pectoris: Secondary | ICD-10-CM | POA: Diagnosis not present

## 2017-03-28 DIAGNOSIS — I509 Heart failure, unspecified: Secondary | ICD-10-CM | POA: Diagnosis not present

## 2017-03-28 DIAGNOSIS — E46 Unspecified protein-calorie malnutrition: Secondary | ICD-10-CM | POA: Diagnosis not present

## 2017-08-09 ENCOUNTER — Emergency Department (HOSPITAL_COMMUNITY): Payer: Medicare Other

## 2017-08-09 ENCOUNTER — Other Ambulatory Visit: Payer: Self-pay

## 2017-08-09 ENCOUNTER — Encounter (HOSPITAL_COMMUNITY): Payer: Self-pay

## 2017-08-09 ENCOUNTER — Emergency Department (HOSPITAL_COMMUNITY)
Admission: EM | Admit: 2017-08-09 | Discharge: 2017-08-09 | Disposition: A | Discharge status: Home / Self Care | Payer: Medicare Other | Attending: Emergency Medicine | Admitting: Emergency Medicine

## 2017-08-09 DIAGNOSIS — W050XXA Fall from non-moving wheelchair, initial encounter: Secondary | ICD-10-CM | POA: Insufficient documentation

## 2017-08-09 DIAGNOSIS — W19XXXA Unspecified fall, initial encounter: Secondary | ICD-10-CM

## 2017-08-09 DIAGNOSIS — Y999 Unspecified external cause status: Secondary | ICD-10-CM | POA: Insufficient documentation

## 2017-08-09 DIAGNOSIS — E119 Type 2 diabetes mellitus without complications: Secondary | ICD-10-CM | POA: Diagnosis not present

## 2017-08-09 DIAGNOSIS — Y939 Activity, unspecified: Secondary | ICD-10-CM | POA: Insufficient documentation

## 2017-08-09 DIAGNOSIS — I5022 Chronic systolic (congestive) heart failure: Secondary | ICD-10-CM | POA: Diagnosis not present

## 2017-08-09 DIAGNOSIS — Z950 Presence of cardiac pacemaker: Secondary | ICD-10-CM | POA: Diagnosis not present

## 2017-08-09 DIAGNOSIS — Z79899 Other long term (current) drug therapy: Secondary | ICD-10-CM | POA: Diagnosis not present

## 2017-08-09 DIAGNOSIS — S0181XA Laceration without foreign body of other part of head, initial encounter: Secondary | ICD-10-CM

## 2017-08-09 DIAGNOSIS — I2510 Atherosclerotic heart disease of native coronary artery without angina pectoris: Secondary | ICD-10-CM | POA: Insufficient documentation

## 2017-08-09 DIAGNOSIS — Z89511 Acquired absence of right leg below knee: Secondary | ICD-10-CM | POA: Insufficient documentation

## 2017-08-09 DIAGNOSIS — T148XXA Other injury of unspecified body region, initial encounter: Secondary | ICD-10-CM

## 2017-08-09 DIAGNOSIS — Z7901 Long term (current) use of anticoagulants: Secondary | ICD-10-CM | POA: Insufficient documentation

## 2017-08-09 DIAGNOSIS — I110 Hypertensive heart disease with heart failure: Secondary | ICD-10-CM | POA: Insufficient documentation

## 2017-08-09 DIAGNOSIS — Y92129 Unspecified place in nursing home as the place of occurrence of the external cause: Secondary | ICD-10-CM | POA: Insufficient documentation

## 2017-08-09 DIAGNOSIS — F0390 Unspecified dementia without behavioral disturbance: Secondary | ICD-10-CM | POA: Diagnosis not present

## 2017-08-09 MED ORDER — LIDOCAINE-EPINEPHRINE (PF) 2 %-1:200000 IJ SOLN
20.0000 mL | Freq: Once | INTRAMUSCULAR | Status: AC
  Administered 2017-08-09: 20 mL
  Filled 2017-08-09: qty 20

## 2017-08-09 MED ORDER — ACETAMINOPHEN 325 MG PO TABS
650.0000 mg | ORAL_TABLET | Freq: Once | ORAL | Status: AC
  Administered 2017-08-09: 650 mg via ORAL
  Filled 2017-08-09: qty 2

## 2017-08-09 NOTE — ED Provider Notes (Signed)
Patient signed out to me by Judith Zuniga at 4 PM.  Patient had mechanical fall at nursing home.  Had face laceration that was repaired.  Negative head imaging.  Patient getting x-ray of right lower extremity in which she has a BKA in that leg.  Patient has hematoma on the stump from the fall and will r/o fx.  Patient okay for discharge, x-ray within normal limits.  X-ray showed no acute fracture.  Patient discharged from the ED in good condition.  Patient is alert and family understands discharge instructions.  Fall, initial encounter  Laceration of forehead, initial encounter  Hematoma   Dispo: discharge   Lennice Sites, DO 08/09/17 1650    Carmin Muskrat, MD 08/10/17 [phone]

## 2017-08-09 NOTE — ED Notes (Signed)
Patient verbalizes understanding of discharge instructions. Opportunity for questioning and answers were provided. Armband removed by staff, pt discharged from ED.  

## 2017-08-09 NOTE — ED Notes (Signed)
Patient transported to CT 

## 2017-08-09 NOTE — Discharge Instructions (Addendum)
Contact a health care provider if: You have a fever. You see a yellowish-white fluid (pus) coming from the wound. Get help right away if: You have redness, pain, or swelling around the wound.   Please use ice to hematoma and compressive dressing.  Pain control per your discretion

## 2017-08-09 NOTE — ED Triage Notes (Signed)
Pt brought in by [facility] from [place] for unwitnessed fall. Per staff they were in room, pt was in her wheelchair, staff turned their back and during that time pt attempted to stand and fell hitting her head. 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 Pt has hx of dementia and per facility is mentating at her baseline. Pt is on eloquis. EDP at bedside.

## 2017-08-09 NOTE — ED Provider Notes (Signed)
[facility] EMERGENCY DEPARTMENT Provider Note   CSN: [id number] Arrival date & time: 08/09/17  1245     History   Chief Complaint Chief Complaint  Patient presents with  . Fall   Level 5 caveat due to dementia HPI Judith Zuniga is a 84 y.o. female who presents from her skilled nursing facility for fall and head injury.  This was a witnessed fall.  Patient was sitting in her wheelchair and medical staff reports that the turned around.  She tried to get up and walk however patient is status post right BKA and fell forward hitting her head.  She did not lose consciousness or knocked herself out.  She does have a laceration to the forehead.  HPI  Past Medical History:  Diagnosis Date  . Atrioventricular block, complete s/p AV ablation   . CAD (coronary artery disease)    s/p redo bypass surgery in 1998 with patent graft in Jan. 2009.   Marland Kitchen Dementia   . Diabetes mellitus   . Elevated liver function tests    on amiodarone  . Hyperlipidemia   . Hypertension   . Ischemic cardiomyopathy    ischemic heart myopathy , ejection fraction of 25%  . Pacemaker -CRT- [employer]   . Permanent atrial fibrillation   . Premature ventricular contractions    frequent  . Systolic heart failure    class II and euvolemic  . Type II or unspecified type diabetes mellitus without mention of complication, not stated as uncontrolled     Patient Active Problem List   Diagnosis Date Noted  . Hematemesis 07/27/2016  . Stroke ([place])   . HLD (hyperlipidemia)   . Delirium 05/05/2014  . History of stroke 05/05/2014  . Ischemic cardiomyopathy 05/05/2014  . Chronic atrial fibrillation ([place]) 05/05/2014  . Diabetes mellitus 05/05/2014  . Systolic heart failure 95/12/3265  . Chronic systolic heart failure ([place])   . DM (diabetes mellitus) type II uncontrolled, periph vascular disorder ([place])   . Orthostatic hypotension 07/28/2013  . Encounter for therapeutic drug monitoring 07/13/2013  .  Multiple pulmonary nodules 12/13/2011  . Atherosclerosis of native arteries of the extremities with ulceration ([place]) 07/09/2011  . Syncope 05/01/2011  . Diastolic CHF, chronic ([place]) 10/08/2010  . Mitral regurgitation 10/08/2010  . Long term current use of anticoagulant 05/16/2010  . ATAXIA 11/14/2009  . CAROTID ARTERY DISEASE 10/17/2009  . AODM 04/09/2008  . Hyperlipidemia 04/09/2008  . Benign hypertensive heart disease with heart failure ([place]) 04/09/2008  . CAD, ARTERY BYPASS GRAFT 04/09/2008  . Atrial fibrillation ([place]) 04/09/2008  . PACEMAKER, CRT-[employer] 04/09/2008    Past Surgical History:  Procedure Laterality Date  . AMPUTATION Right 05/11/2014   Procedure: AMPUTATION BELOW KNEE;  Surgeon: Newt Minion, MD;  Location: [facility];  Service: Orthopedics;  Laterality: Right;  . cardiac caths     multile  . CORONARY ARTERY BYPASS GRAFT  1998   CABG x5: RIMA to diagonal one, RIMA to diagonal 2, SVG to PD, SVG to PL, SVG to circ  . CORONARY ARTERY BYPASS GRAFT  1985   x3: LIMA to LAD, SVG to PD and SVG to PL  . pacer generator change out     [employer] Jude pacemaker     OB History   None      Home Medications    Prior to Admission medications   Medication Sig Start Date End Date Taking? Authorizing Provider  apixaban (ELIQUIS) 2.5 MG TABS tablet Take 2.5  mg by mouth 2 (two) times daily.    [provider]  aspirin EC 81 MG EC tablet Take 1 tablet (81 mg total) by mouth daily. 05/15/14   Caren Griffins, MD  atorvastatin (LIPITOR) 40 MG tablet Take 40 mg by mouth daily.    [provider]  bisoprolol (ZEBETA) 10 MG tablet Take 1 tablet (10 mg total) by mouth daily. 12/15/13   Larey Dresser, MD  Cholecalciferol (VITAMIN D-3) 1000 UNITS CAPS Take 2 capsules by mouth daily.    [provider]  docusate sodium (COLACE) 100 MG capsule Take 100 mg by mouth 2 (two) times daily.    [provider]  ferrous sulfate 325 (65 FE) MG tablet Take 325 mg by  mouth 2 (two) times daily with a meal.    [provider]  furosemide (LASIX) 40 MG tablet TAKE1/2 TABLET BY MOUTH EVERY DAY Patient taking differently: Take 20-40 mg by mouth See admin instructions. Take 40 mg by mouth for one week starting 10/24/16 and stop 10/30/16 for CHF then resume Lasix 20 mg by mouth daily 02/16/14   Larey Dresser, MD  glimepiride (AMARYL) 1 MG tablet Take 1 mg by mouth daily with breakfast.  06/12/13   [provider]  HYDROcodone-acetaminophen (NORCO/VICODIN) 5-325 MG per tablet Take 1 tablet by mouth every 6 (six) hours as needed for moderate pain. 05/15/14   Caren Griffins, MD  losartan (COZAAR) 50 MG tablet Take 50 mg by mouth daily.  08/23/11   Larey Dresser, MD  Memantine HCl ER (NAMENDA XR) 28 MG CP24 Take 1 capsule by mouth daily.    [provider]  nitroGLYCERIN (NITRODUR - DOSED IN MG/24 HR) 0.2 mg/hr patch Place 0.2 mg onto the skin daily.    [provider]  oxyCODONE-acetaminophen (PERCOCET/ROXICET) 5-325 MG tablet Take 1 tablet by mouth every 6 (six) hours as needed for pain. 08/13/16   [provider]  pantoprazole (PROTONIX) 40 MG tablet Take 40 mg by mouth daily. 08/01/16   [provider]  pentoxifylline (TRENTAL) 400 MG CR tablet Take 400 mg by mouth 3 (three) times daily with meals.    [provider]  potassium chloride (K-DUR) 10 MEQ tablet TAKE ONE TABLET BY MOUTH ONCE DAILY 01/06/14   Larey Dresser, MD  sertraline (ZOLOFT) 50 MG tablet Take 50 mg by mouth daily.    [provider]    Family History Family History  Problem Relation Age of Onset  . Other Mother        pacemaker  . Heart disease Father   . Cancer Sister        gallbladder    Social History Social History   Tobacco Use  . Smoking status: Never Smoker  . Smokeless tobacco: Never Used  Substance Use Topics  . Alcohol use: No  . Drug use: No     Allergies   Patient has no known  allergies.   Review of Systems Review of Systems   Ten systems reviewed and are negative for acute change, except as noted in the HPI.    Physical Exam Updated Vital Signs BP (!) 149/80   Pulse 64   Temp (!) 97.2 F (36.2 C) (Oral)   Resp 20   Ht 5\' 4"  (1.626 m)   Wt 47.3 kg (104 lb 4.4 oz)   SpO2 100%   BMI 17.90 kg/m   Physical Exam  Constitutional: She appears well-developed and well-nourished. No  distress.  C-collar in place  HENT:  Head: Normocephalic.  Old healing bruising around the right eye orbit Laceration to the right forehead with controlled bleeding  Eyes: Conjunctivae are normal. No scleral icterus.  Neck: Normal range of motion.  Cardiovascular: Normal rate, regular rhythm and normal heart sounds. Exam reveals no gallop and no friction rub.  No murmur heard. Pulmonary/Chest: Effort normal and breath sounds normal. No respiratory distress.  Abdominal: Soft. Bowel sounds are normal. She exhibits no distension and no mass. There is no tenderness. There is no guarding.  Musculoskeletal:  Status post right BKA  Neurological: She is alert.  Skin: Skin is warm and dry. She is not diaphoretic.  Psychiatric: Her behavior is normal.  Nursing note and vitals reviewed.    ED Treatments / Results  Labs (all labs ordered are listed, but only abnormal results are displayed) Labs Reviewed - No data to display  EKG None  Radiology Ct Head Wo Contrast  Result Date: 08/09/2017 CLINICAL DATA:  Pain after fall EXAM: CT HEAD WITHOUT CONTRAST CT CERVICAL SPINE WITHOUT CONTRAST TECHNIQUE: Multidetector CT imaging of the head and cervical spine was performed following the standard protocol without intravenous contrast. Multiplanar CT image reconstructions of the cervical spine were also generated. COMPARISON:  June 14, 2016 FINDINGS: CT HEAD FINDINGS Brain: No subdural, epidural, or subarachnoid hemorrhage. The brainstem and basal cisterns are normal. Cerebellum is  normal. Ventricles and sulci are prominent but stable. White matter changes are identified most prominent the frontal lobes bilaterally, stable on the left and increased on the right in the interval. The overlying frontal cortex appears is mildly involved on the right. While this is new since March of 2018, it is likely nonacute. No acute cortical ischemia is noted. No mass effect or midline shift. Vascular: Calcified atherosclerosis is seen in the intracranial carotids. Skull: Normal. Negative for fracture or focal lesion. Sinuses/Orbits: No acute finding. Other: None. CT CERVICAL SPINE FINDINGS Alignment: No change in alignment. Mild anterolisthesis C3 versus C4 is stable. Mild kyphosis at C4 and C5 is stable. Anterolisthesis of C6 versus C7 is stable. Widening of the anterior C6-7 disc space is also stable. No acute fractures identified. Skull base and vertebrae: No acute fractures identified. Soft tissues and spinal canal: No prevertebral fluid or swelling. No visible canal hematoma. Disc levels:  Multilevel degenerative changes. Upper chest: Negative. Other: No other abnormalities are identified. IMPRESSION: 1. No acute intracranial abnormality. 2. No acute traumatic malalignment or fracture. Multilevel degenerative changes. Electronically Signed   By: Dorise Bullion III M.D   On: 08/09/2017 14:43   Ct Cervical Spine Wo Contrast  Result Date: 08/09/2017 CLINICAL DATA:  Pain after fall EXAM: CT HEAD WITHOUT CONTRAST CT CERVICAL SPINE WITHOUT CONTRAST TECHNIQUE: Multidetector CT imaging of the head and cervical spine was performed following the standard protocol without intravenous contrast. Multiplanar CT image reconstructions of the cervical spine were also generated. COMPARISON:  June 14, 2016 FINDINGS: CT HEAD FINDINGS Brain: No subdural, epidural, or subarachnoid hemorrhage. The brainstem and basal cisterns are normal. Cerebellum is normal. Ventricles and sulci are prominent but stable. White matter  changes are identified most prominent the frontal lobes bilaterally, stable on the left and increased on the right in the interval. The overlying frontal cortex appears is mildly involved on the right. While this is new since March of 2018, it is likely nonacute. No acute cortical ischemia is noted. No mass effect or midline shift. Vascular: Calcified atherosclerosis is seen in the  intracranial carotids. Skull: Normal. Negative for fracture or focal lesion. Sinuses/Orbits: No acute finding. Other: None. CT CERVICAL SPINE FINDINGS Alignment: No change in alignment. Mild anterolisthesis C3 versus C4 is stable. Mild kyphosis at C4 and C5 is stable. Anterolisthesis of C6 versus C7 is stable. Widening of the anterior C6-7 disc space is also stable. No acute fractures identified. Skull base and vertebrae: No acute fractures identified. Soft tissues and spinal canal: No prevertebral fluid or swelling. No visible canal hematoma. Disc levels:  Multilevel degenerative changes. Upper chest: Negative. Other: No other abnormalities are identified. IMPRESSION: 1. No acute intracranial abnormality. 2. No acute traumatic malalignment or fracture. Multilevel degenerative changes. Electronically Signed   By: Dorise Bullion III M.D   On: 08/09/2017 14:43    Procedures Procedures (including critical care time)  Medications Ordered in ED Medications  lidocaine-EPINEPHrine (XYLOCAINE W/EPI) 2 %-1:200000 (PF) injection 20 mL (20 mLs Infiltration Given 08/09/17 1454)  acetaminophen (TYLENOL) tablet 650 mg (650 mg Oral Given 08/09/17 1551)     LACERATION REPAIR Performed by: Margarita Mail Authorized by: Margarita Mail Consent: Verbal consent obtained. Risks and benefits: risks, benefits and alternatives were discussed Consent given by: patient Patient identity confirmed: provided demographic data Prepped and Draped in normal sterile fashion Wound explored  Laceration Location: Forehead  Laceration Length: 3 cm  No  Foreign Bodies seen or palpated  Anesthesia: local infiltration  Local anesthetic: lidocaine 2% w epinephrine  Anesthetic total: 3 ml  Irrigation method: syringe Amount of cleaning: standard  Skin closure: 5.0 vicryl  Number of sutures:   Technique: SI/HM  Patient tolerance: Patient tolerated the procedure well with no immediate complications.    Initial Impression / Assessment and Plan / ED Course  I have reviewed the triage vital signs and the nursing notes.  Pertinent labs & imaging results that were available during my care of the patient were reviewed by me and considered in my medical decision making (see chart for details).     Patient CT scans negative for acute abnormality.  Head laceration repaired.  In the interval of my first evaluation to time for laceration repaired resulting CT scans patient has had development of a large hematoma on the stump of her right leg.  I have ordered a white plain film of the tibia and fibula to evaluate for potential fracture.  She was given Tylenol and ice.  Is given sign out to Dr. Ronnald Nian who will follow up on imaging and dispo appropriately.  Final Clinical Impressions(s) / ED Diagnoses   Final diagnoses:  Fall, initial encounter  Laceration of forehead, initial encounter  Hematoma    ED Discharge Orders    None       Margarita Mail, PA-C 08/09/17 1610    Duffy Bruce, MD 08/09/17 [phone]

## 2017-08-09 NOTE — ED Notes (Signed)
EDP at bedside  

## 2017-08-31 ENCOUNTER — Emergency Department (HOSPITAL_COMMUNITY): Payer: Medicare Other

## 2017-08-31 ENCOUNTER — Other Ambulatory Visit: Payer: Self-pay

## 2017-08-31 ENCOUNTER — Inpatient Hospital Stay (HOSPITAL_COMMUNITY)
Admission: EM | Admit: 2017-08-31 | Discharge: 2017-09-03 | DRG: 291 | Disposition: A | Discharge status: Skilled Nursing Facility | Payer: Medicare Other | Attending: Family Medicine | Admitting: Family Medicine

## 2017-08-31 ENCOUNTER — Encounter (HOSPITAL_COMMUNITY): Payer: Self-pay | Admitting: Emergency Medicine

## 2017-08-31 DIAGNOSIS — J181 Lobar pneumonia, unspecified organism: Secondary | ICD-10-CM | POA: Diagnosis present

## 2017-08-31 DIAGNOSIS — I255 Ischemic cardiomyopathy: Secondary | ICD-10-CM | POA: Diagnosis present

## 2017-08-31 DIAGNOSIS — R238 Other skin changes: Secondary | ICD-10-CM

## 2017-08-31 DIAGNOSIS — I5043 Acute on chronic combined systolic (congestive) and diastolic (congestive) heart failure: Secondary | ICD-10-CM | POA: Diagnosis present

## 2017-08-31 DIAGNOSIS — J810 Acute pulmonary edema: Secondary | ICD-10-CM

## 2017-08-31 DIAGNOSIS — I110 Hypertensive heart disease with heart failure: Principal | ICD-10-CM | POA: Diagnosis present

## 2017-08-31 DIAGNOSIS — I482 Chronic atrial fibrillation, unspecified: Secondary | ICD-10-CM

## 2017-08-31 DIAGNOSIS — L909 Atrophic disorder of skin, unspecified: Secondary | ICD-10-CM

## 2017-08-31 DIAGNOSIS — Z7982 Long term (current) use of aspirin: Secondary | ICD-10-CM

## 2017-08-31 DIAGNOSIS — Z7951 Long term (current) use of inhaled steroids: Secondary | ICD-10-CM | POA: Diagnosis not present

## 2017-08-31 DIAGNOSIS — Z7984 Long term (current) use of oral hypoglycemic drugs: Secondary | ICD-10-CM | POA: Diagnosis not present

## 2017-08-31 DIAGNOSIS — E119 Type 2 diabetes mellitus without complications: Secondary | ICD-10-CM

## 2017-08-31 DIAGNOSIS — E785 Hyperlipidemia, unspecified: Secondary | ICD-10-CM | POA: Diagnosis present

## 2017-08-31 DIAGNOSIS — Z7901 Long term (current) use of anticoagulants: Secondary | ICD-10-CM

## 2017-08-31 DIAGNOSIS — K5900 Constipation, unspecified: Secondary | ICD-10-CM | POA: Diagnosis present

## 2017-08-31 DIAGNOSIS — D649 Anemia, unspecified: Secondary | ICD-10-CM

## 2017-08-31 DIAGNOSIS — D638 Anemia in other chronic diseases classified elsewhere: Secondary | ICD-10-CM | POA: Diagnosis present

## 2017-08-31 DIAGNOSIS — K920 Hematemesis: Secondary | ICD-10-CM | POA: Diagnosis present

## 2017-08-31 DIAGNOSIS — I4891 Unspecified atrial fibrillation: Secondary | ICD-10-CM | POA: Diagnosis present

## 2017-08-31 DIAGNOSIS — J189 Pneumonia, unspecified organism: Secondary | ICD-10-CM

## 2017-08-31 DIAGNOSIS — Z515 Encounter for palliative care: Secondary | ICD-10-CM | POA: Diagnosis present

## 2017-08-31 DIAGNOSIS — I272 Pulmonary hypertension, unspecified: Secondary | ICD-10-CM | POA: Diagnosis present

## 2017-08-31 DIAGNOSIS — I5032 Chronic diastolic (congestive) heart failure: Secondary | ICD-10-CM

## 2017-08-31 DIAGNOSIS — Z79899 Other long term (current) drug therapy: Secondary | ICD-10-CM | POA: Diagnosis not present

## 2017-08-31 DIAGNOSIS — Z8249 Family history of ischemic heart disease and other diseases of the circulatory system: Secondary | ICD-10-CM

## 2017-08-31 DIAGNOSIS — Y95 Nosocomial condition: Secondary | ICD-10-CM | POA: Diagnosis present

## 2017-08-31 DIAGNOSIS — E1151 Type 2 diabetes mellitus with diabetic peripheral angiopathy without gangrene: Secondary | ICD-10-CM | POA: Diagnosis present

## 2017-08-31 DIAGNOSIS — Z66 Do not resuscitate: Secondary | ICD-10-CM | POA: Diagnosis present

## 2017-08-31 DIAGNOSIS — I480 Paroxysmal atrial fibrillation: Secondary | ICD-10-CM

## 2017-08-31 DIAGNOSIS — Z8673 Personal history of transient ischemic attack (TIA), and cerebral infarction without residual deficits: Secondary | ICD-10-CM | POA: Diagnosis not present

## 2017-08-31 DIAGNOSIS — Z89511 Acquired absence of right leg below knee: Secondary | ICD-10-CM

## 2017-08-31 DIAGNOSIS — I2510 Atherosclerotic heart disease of native coronary artery without angina pectoris: Secondary | ICD-10-CM | POA: Diagnosis present

## 2017-08-31 DIAGNOSIS — I5033 Acute on chronic diastolic (congestive) heart failure: Secondary | ICD-10-CM | POA: Diagnosis not present

## 2017-08-31 DIAGNOSIS — F0390 Unspecified dementia without behavioral disturbance: Secondary | ICD-10-CM | POA: Diagnosis present

## 2017-08-31 DIAGNOSIS — R0602 Shortness of breath: Secondary | ICD-10-CM

## 2017-08-31 DIAGNOSIS — I340 Nonrheumatic mitral (valve) insufficiency: Secondary | ICD-10-CM | POA: Diagnosis not present

## 2017-08-31 DIAGNOSIS — Z951 Presence of aortocoronary bypass graft: Secondary | ICD-10-CM

## 2017-08-31 DIAGNOSIS — Z7189 Other specified counseling: Secondary | ICD-10-CM | POA: Diagnosis not present

## 2017-08-31 DIAGNOSIS — E43 Unspecified severe protein-calorie malnutrition: Secondary | ICD-10-CM | POA: Diagnosis present

## 2017-08-31 DIAGNOSIS — Z950 Presence of cardiac pacemaker: Secondary | ICD-10-CM | POA: Diagnosis not present

## 2017-08-31 DIAGNOSIS — R0902 Hypoxemia: Secondary | ICD-10-CM

## 2017-08-31 DIAGNOSIS — I7025 Atherosclerosis of native arteries of other extremities with ulceration: Secondary | ICD-10-CM | POA: Diagnosis present

## 2017-08-31 HISTORY — DX: Other skin changes: R23.8

## 2017-08-31 HISTORY — DX: Atrophic disorder of skin, unspecified: L90.9

## 2017-08-31 LAB — POC OCCULT BLOOD, ED: Fecal Occult Bld: NEGATIVE

## 2017-08-31 LAB — IRON AND TIBC
Iron: 29 ug/dL (ref 28–170)
SATURATION RATIOS: 14 % (ref 10.4–31.8)
TIBC: 214 ug/dL — AB (ref 250–450)
UIBC: 185 ug/dL

## 2017-08-31 LAB — CBC WITH DIFFERENTIAL/PLATELET
ABS IMMATURE GRANULOCYTES: 0 10*3/uL (ref 0.0–0.1)
BASOS ABS: 0 10*3/uL (ref 0.0–0.1)
Basophils Relative: 0 %
Eosinophils Absolute: 0.2 10*3/uL (ref 0.0–0.7)
Eosinophils Relative: 2 %
HEMATOCRIT: 23.9 % — AB (ref 36.0–46.0)
HEMOGLOBIN: 7.4 g/dL — AB (ref 12.0–15.0)
Immature Granulocytes: 0 %
LYMPHS ABS: 2.1 10*3/uL (ref 0.7–4.0)
LYMPHS PCT: 26 %
MCH: 30.8 pg (ref 26.0–34.0)
MCHC: 31 g/dL (ref 30.0–36.0)
MCV: 99.6 fL (ref 78.0–100.0)
MONO ABS: 0.6 10*3/uL (ref 0.1–1.0)
MONOS PCT: 7 %
NEUTROS ABS: 5.3 10*3/uL (ref 1.7–7.7)
Neutrophils Relative %: 65 %
Platelets: 191 10*3/uL (ref 150–400)
RBC: 2.4 MIL/uL — ABNORMAL LOW (ref 3.87–5.11)
RDW: 15.1 % (ref 11.5–15.5)
WBC: 8.2 10*3/uL (ref 4.0–10.5)

## 2017-08-31 LAB — BASIC METABOLIC PANEL
Anion gap: 10 (ref 5–15)
BUN: 31 mg/dL — ABNORMAL HIGH (ref 6–20)
CALCIUM: 8.4 mg/dL — AB (ref 8.9–10.3)
CO2: 26 mmol/L (ref 22–32)
CREATININE: 1.03 mg/dL — AB (ref 0.44–1.00)
Chloride: 107 mmol/L (ref 101–111)
GFR calc Af Amer: 52 mL/min — ABNORMAL LOW (ref >60)
GFR, EST NON AFRICAN AMERICAN: 45 mL/min — AB (ref >60)
GLUCOSE: 95 mg/dL (ref 65–99)
POTASSIUM: 3.7 mmol/L (ref 3.5–5.1)
SODIUM: 143 mmol/L (ref 135–145)

## 2017-08-31 LAB — BRAIN NATRIURETIC PEPTIDE: B Natriuretic Peptide: 977.5 pg/mL — ABNORMAL HIGH (ref 0.0–100.0)

## 2017-08-31 LAB — RETICULOCYTES
RBC.: 2.26 MIL/uL — ABNORMAL LOW (ref 3.87–5.11)
Retic Count, Absolute: 22.6 10*3/uL (ref 19.0–186.0)
Retic Ct Pct: 1 % (ref 0.4–3.1)

## 2017-08-31 LAB — GLUCOSE, CAPILLARY: Glucose-Capillary: 99 mg/dL (ref 65–99)

## 2017-08-31 LAB — FERRITIN: FERRITIN: 182 ng/mL (ref 11–307)

## 2017-08-31 LAB — MRSA PCR SCREENING: MRSA BY PCR: POSITIVE — AB

## 2017-08-31 LAB — I-STAT TROPONIN, ED: TROPONIN I, POC: 0.02 ng/mL (ref 0.00–0.08)

## 2017-08-31 LAB — HEMOGLOBIN A1C
Hgb A1c MFr Bld: 5.7 % — ABNORMAL HIGH (ref 4.8–5.6)
Mean Plasma Glucose: 116.89 mg/dL

## 2017-08-31 LAB — VITAMIN B12: Vitamin B-12: 328 pg/mL (ref 180–914)

## 2017-08-31 LAB — CBG MONITORING, ED: Glucose-Capillary: 92 mg/dL (ref 65–99)

## 2017-08-31 LAB — FOLATE: Folate: 20.4 ng/mL (ref >5.9)

## 2017-08-31 MED ORDER — OXYMETAZOLINE HCL 0.05 % NA SOLN: 1.0000 | Freq: Two times a day (BID) | NASAL | Status: DC | PRN

## 2017-08-31 MED ORDER — APIXABAN 2.5 MG PO TABS
2.5000 mg | ORAL_TABLET | Freq: Two times a day (BID) | ORAL | Status: DC
  Administered 2017-08-31: 2.5 mg via ORAL
  Filled 2017-08-31 (×2): qty 1

## 2017-08-31 MED ORDER — ACETAMINOPHEN 325 MG PO TABS: 650.0000 mg | ORAL_TABLET | ORAL | Status: DC | PRN

## 2017-08-31 MED ORDER — BISOPROLOL FUMARATE 10 MG PO TABS: 10.0000 mg | ORAL_TABLET | Freq: Every day | ORAL | Status: DC

## 2017-08-31 MED ORDER — FUROSEMIDE 10 MG/ML IJ SOLN
20.0000 mg | Freq: Once | INTRAMUSCULAR | Status: AC
  Administered 2017-08-31: 20 mg via INTRAVENOUS
  Filled 2017-08-31: qty 2

## 2017-08-31 MED ORDER — SENNOSIDES-DOCUSATE SODIUM 8.6-50 MG PO TABS: 1.0000 | ORAL_TABLET | Freq: Every day | ORAL | Status: DC | PRN

## 2017-08-31 MED ORDER — ONDANSETRON HCL 4 MG/2ML IJ SOLN: 4.0000 mg | Freq: Four times a day (QID) | INTRAMUSCULAR | Status: DC | PRN

## 2017-08-31 MED ORDER — HYDROCODONE-ACETAMINOPHEN 5-325 MG PO TABS
1.0000 | ORAL_TABLET | Freq: Four times a day (QID) | ORAL | Status: DC | PRN
Start: 2017-08-31 — End: 2017-09-03

## 2017-08-31 MED ORDER — ASPIRIN EC 81 MG PO TBEC
81.0000 mg | DELAYED_RELEASE_TABLET | Freq: Every day | ORAL | Status: DC
  Administered 2017-08-31: 81 mg via ORAL
  Filled 2017-08-31: qty 1

## 2017-08-31 MED ORDER — PIPERACILLIN-TAZOBACTAM IN DEX 2-0.25 GM/50ML IV SOLN
2.2500 g | Freq: Three times a day (TID) | INTRAVENOUS | Status: DC
  Administered 2017-08-31 – 2017-09-01 (×2): 2.25 g via INTRAVENOUS
  Filled 2017-08-31 (×6): qty 50

## 2017-08-31 MED ORDER — POTASSIUM CHLORIDE 20 MEQ/15ML (10%) PO SOLN
20.0000 meq | Freq: Two times a day (BID) | ORAL | Status: DC
  Administered 2017-08-31 – 2017-09-03 (×6): 20 meq via ORAL
  Filled 2017-08-31 (×6): qty 15

## 2017-08-31 MED ORDER — ATORVASTATIN CALCIUM 40 MG PO TABS
40.0000 mg | ORAL_TABLET | Freq: Every day | ORAL | Status: DC
  Filled 2017-08-31: qty 1

## 2017-08-31 MED ORDER — PANTOPRAZOLE SODIUM 40 MG PO TBEC
40.0000 mg | DELAYED_RELEASE_TABLET | Freq: Every day | ORAL | Status: DC
  Administered 2017-08-31: 40 mg via ORAL
  Filled 2017-08-31: qty 1

## 2017-08-31 MED ORDER — SERTRALINE HCL 25 MG PO TABS: 25.0000 mg | ORAL_TABLET | Freq: Every day | ORAL | Status: DC

## 2017-08-31 MED ORDER — SODIUM CHLORIDE 0.9% FLUSH
3.0000 mL | Freq: Two times a day (BID) | INTRAVENOUS | Status: DC
  Administered 2017-08-31 – 2017-09-03 (×5): 3 mL via INTRAVENOUS

## 2017-08-31 MED ORDER — PIPERACILLIN-TAZOBACTAM 3.375 G IVPB 30 MIN
3.3750 g | Freq: Once | INTRAVENOUS | Status: AC
  Administered 2017-08-31: 3.375 g via INTRAVENOUS
  Filled 2017-08-31: qty 50

## 2017-08-31 MED ORDER — VANCOMYCIN HCL 500 MG IV SOLR
500.0000 mg | INTRAVENOUS | Status: DC
  Administered 2017-09-01 – 2017-09-02 (×2): 500 mg via INTRAVENOUS
  Filled 2017-08-31 (×2): qty 500

## 2017-08-31 MED ORDER — ENOXAPARIN SODIUM 40 MG/0.4ML ~~LOC~~ SOLN
40.0000 mg | Freq: Every day | SUBCUTANEOUS | Status: DC
  Administered 2017-08-31: 40 mg via SUBCUTANEOUS
  Filled 2017-08-31: qty 0.4

## 2017-08-31 MED ORDER — LOSARTAN POTASSIUM 50 MG PO TABS
50.0000 mg | ORAL_TABLET | Freq: Every day | ORAL | Status: DC
Start: 2017-08-31 — End: 2017-08-31

## 2017-08-31 MED ORDER — SODIUM CHLORIDE 0.9 % IV SOLN: 250.0000 mL | INTRAVENOUS | Status: DC | PRN

## 2017-08-31 MED ORDER — SODIUM CHLORIDE 0.9% FLUSH: 3.0000 mL | INTRAVENOUS | Status: DC | PRN

## 2017-08-31 MED ORDER — ALBUTEROL SULFATE (2.5 MG/3ML) 0.083% IN NEBU
2.5000 mg | INHALATION_SOLUTION | RESPIRATORY_TRACT | Status: AC
  Administered 2017-08-31: 2.5 mg via RESPIRATORY_TRACT
  Filled 2017-08-31 (×2): qty 3

## 2017-08-31 MED ORDER — METOPROLOL TARTRATE 5 MG/5ML IV SOLN: 5.0000 mg | Freq: Three times a day (TID) | INTRAVENOUS | Status: DC

## 2017-08-31 MED ORDER — INSULIN ASPART 100 UNIT/ML ~~LOC~~ SOLN
0.0000 [IU] | Freq: Three times a day (TID) | SUBCUTANEOUS | Status: DC
  Administered 2017-09-03: 1 [IU] via SUBCUTANEOUS

## 2017-08-31 MED ORDER — CHLORHEXIDINE GLUCONATE CLOTH 2 % EX PADS
6.0000 | MEDICATED_PAD | Freq: Every day | CUTANEOUS | Status: DC
  Administered 2017-09-01 – 2017-09-02 (×2): 6 via TOPICAL

## 2017-08-31 MED ORDER — VANCOMYCIN HCL IN DEXTROSE 1-5 GM/200ML-% IV SOLN
1000.0000 mg | Freq: Once | INTRAVENOUS | Status: AC
  Administered 2017-08-31: 1000 mg via INTRAVENOUS
  Filled 2017-08-31: qty 200

## 2017-08-31 MED ORDER — MEMANTINE HCL ER 28 MG PO CP24
28.0000 mg | ORAL_CAPSULE | Freq: Every day | ORAL | Status: DC
  Administered 2017-09-01 – 2017-09-03 (×3): 28 mg via ORAL
  Filled 2017-08-31 (×3): qty 1

## 2017-08-31 MED ORDER — INSULIN ASPART 100 UNIT/ML ~~LOC~~ SOLN: 0.0000 [IU] | Freq: Every day | SUBCUTANEOUS | Status: DC

## 2017-08-31 MED ORDER — MAGNESIUM HYDROXIDE 400 MG/5ML PO SUSP: 30.0000 mL | Freq: Every day | ORAL | Status: DC | PRN

## 2017-08-31 MED ORDER — FERROUS SULFATE 325 (65 FE) MG PO TABS
325.0000 mg | ORAL_TABLET | Freq: Two times a day (BID) | ORAL | Status: DC
  Administered 2017-09-01 – 2017-09-03 (×5): 325 mg via ORAL
  Filled 2017-08-31 (×6): qty 1

## 2017-08-31 MED ORDER — MUPIROCIN 2 % EX OINT
1.0000 "application " | TOPICAL_OINTMENT | Freq: Two times a day (BID) | CUTANEOUS | Status: DC
  Administered 2017-08-31 – 2017-09-03 (×6): 1 via NASAL
  Filled 2017-08-31: qty 22

## 2017-08-31 MED ORDER — GUAIFENESIN 100 MG/5ML PO SOLN
15.0000 mL | Freq: Two times a day (BID) | ORAL | Status: DC | PRN
Start: 2017-08-31 — End: 2017-09-03

## 2017-08-31 NOTE — ED Provider Notes (Signed)
Patient presented to the ER with difficulty breathing.  Family reports that patient has had progressively worsening shortness of breath and cough for several days.  Face to face Exam: HEENT - PERRLA Lungs -diffuse rales, rhonchi and upper airway resonance Heart - RRR, no M/R/G Abd - S/NT/ND Neuro - alert, oriented x3  Plan: Outpatient work-up reveals elevated BNP, likely pulmonary edema.  Patient will require hospitalization for further management.   Orpah Greek, MD 08/31/17 [phone]

## 2017-08-31 NOTE — Consult Note (Addendum)
[facility] Nurse wound consult note Reason for Consult: left foot wounds, right stump wound.  I evaluated the patient in the ED space D35. The left lateral foot has a wound that measures 0.7 cm x 0.7cm x unknown depth, unclear etiology.  Wound bed is 100% yellow.  The left foot first metatarsal head, lateral aspect, has a blackened wound that measures 2 cm x 2 cm x unknown depth due to eschar; unclear etiology.  The plan for these areas is betadine to sites, allow to air dry, wrap in kerlex to protect.  To be performed twice daily.  The patient c/o pain during light touch to the areas.  Surrounding areas are slightly erythematous with peeling skin.  No odor, no drainage noted on the foam dressings present to the site.    The right stump has a wound that the patient's daughter reports started about 3 weeks ago when the patient slid out of her wheelchair and hit her stump.  The daughter reports it started as a bruise and the staff at the facility have been "draining it".  Today is has a foam dressing secured with kerlex.  The wound is gapping open and heavily covered in a blood clot; no active bleeding at the time of my eval.  The daughter reports the patient has not been seen by her surgeon since this wound has occurred.  My number one recommendation for this wound is for a surgical consult for further evaluation.  The patient's daughter states the surgery was performed here.  For now, I have ordered saline moistened gauze twice daily.  The wound measures 4 cm x 3.8 cm x 1.2 cm. Monitor the wound area(s) for worsening of condition such as: Signs/symptoms of infection,  Increase in size,  Development of or worsening of odor, Development of pain, or increased pain at the affected locations.  Notify the medical team if any of these develop.  Thank you for the consult.  Discussed plan of care with the patient and bedside nurse.  [facility] nurse will not follow at this time.  Please re-consult the [facility] team if  needed.  Val Riles, RN, MSN, CWOCN, CNS-BC, pager [phone]

## 2017-08-31 NOTE — ED Triage Notes (Signed)
Pt from [facility]. Pt was short of breath and had a chest x-ray which revealed pneumonia versus pulmonary edema.  Pt has rhonchi throughout all fields.  Baseline dementia.

## 2017-08-31 NOTE — Evaluation (Signed)
Clinical/Bedside Swallow Evaluation Patient Details  Name: Judith Zuniga MRN: [id number] Date of Birth: [birth year]  Today's Date: 08/31/2017 Time: SLP Start Time (ACUTE ONLY): 1618 SLP Stop Time (ACUTE ONLY): 1630 SLP Time Calculation (min) (ACUTE ONLY): 12 min  Past Medical History:  Past Medical History:  Diagnosis Date  . Atrioventricular block, complete s/p AV ablation   . CAD (coronary artery disease)    s/p redo bypass surgery in 1998 with patent graft in Jan. 2009.   Marland Kitchen Dementia   . Diabetes mellitus   . Elevated liver function tests    on amiodarone  . Hyperlipidemia   . Hypertension   . Ischemic cardiomyopathy    ischemic heart myopathy , ejection fraction of 25%  . Pacemaker -CRT- [employer]   . Permanent atrial fibrillation   . Premature ventricular contractions    frequent  . Skin breakdown   . Systolic heart failure    class II and euvolemic  . Type II or unspecified type diabetes mellitus without mention of complication, not stated as uncontrolled    Past Surgical History:  Past Surgical History:  Procedure Laterality Date  . AMPUTATION Right 05/11/2014   Procedure: AMPUTATION BELOW KNEE;  Surgeon: Newt Minion, MD;  Location: [facility];  Service: Orthopedics;  Laterality: Right;  . cardiac caths     multile  . CORONARY ARTERY BYPASS GRAFT  1998   CABG x5: RIMA to diagonal one, RIMA to diagonal 2, SVG to PD, SVG to PL, SVG to circ  . CORONARY ARTERY BYPASS GRAFT  1985   x3: LIMA to LAD, SVG to PD and SVG to PL  . pacer generator change out     [employer]. Jude pacemaker   HPI:  84 year old female with significant dementia, peripheral vascular disease status post right below the knee amputation, chronic atrial fibrillation and ischemic cardiomyopathy status post pacemaker placement, type 2 diabetes mellitus, hypertension presented to the emergency department with fever, and chest x-ray consistent with pneumonia versus mild vascular congestion. SLP evaluated 05/06/14 with  findings of normal oropharyngeal swallow function; regular diet and thin liquids was recommended.   Assessment / Plan / Recommendation Clinical Impression   Patient presents with delayed, weak coughing and wheezing after trials of thin liquids, purees, concerning for decreased airway protection. RN reports pt had coughing, choking after taking pills. She is alert, confused, follows some simple commands with visual cues. Per RN palliative consult pending; no family present to discuss goals of care. Recommend she remain NPO for now, with the exception of ice chips after oral care as tolerated. Will follow up next date to determine readiness for POs or instrumental study if clinically indicated/ warranted per goals of care.    SLP Visit Diagnosis: Dysphagia, unspecified (R13.10)    Aspiration Risk  Moderate aspiration risk    Diet Recommendation NPO;Ice chips PRN after oral care   Liquid Administration via: Spoon Medication Administration: Via alternative means Supervision: Full supervision/cueing for compensatory strategies    Other  Recommendations Oral Care Recommendations: Oral care QID;Oral care prior to ice chip/H20 Other Recommendations: Have oral suction available   Follow up Recommendations Other (comment)(tba)      Frequency and Duration min 2x/week  2 weeks       Prognosis Prognosis for Safe Diet Advancement: Fair Barriers to Reach Goals: Cognitive deficits      Swallow Study   General Date of Onset: 08/31/17 HPI: 84 year old female with significant dementia, peripheral vascular disease status post right below  the knee amputation, chronic atrial fibrillation and ischemic cardiomyopathy status post pacemaker placement, type 2 diabetes mellitus, hypertension presented to the emergency department with fever, and chest x-ray consistent with pneumonia versus mild vascular congestion. SLP evaluated 05/06/14 with findings of normal oropharyngeal swallow function; regular diet and  thin liquids was recommended. Type of Study: Bedside Swallow Evaluation Previous Swallow Assessment: see HPI Diet Prior to this Study: NPO Temperature Spikes Noted: No Respiratory Status: Nasal cannula History of Recent Intubation: No Behavior/Cognition: Alert;Pleasant mood Oral Cavity Assessment: Dry Oral Care Completed by SLP: No Oral Cavity - Dentition: Adequate natural dentition Self-Feeding Abilities: Total assist Patient Positioning: Upright in bed Baseline Vocal Quality: Low vocal intensity Volitional Cough: Cognitively unable to elicit Volitional Swallow: Unable to elicit    Oral/Motor/Sensory Function Overall Oral Motor/Sensory Function: Other (comment)(appears WFL; limited following of commands)   Ice Chips Ice chips: Within functional limits Presentation: Spoon   Thin Liquid Thin Liquid: Impaired Presentation: Cup;Straw Oral Phase Impairments: Reduced labial seal;Poor awareness of bolus Oral Phase Functional Implications: Prolonged oral transit Pharyngeal  Phase Impairments: Suspected delayed Swallow;Cough - Delayed    Nectar Thick Nectar Thick Liquid: Not tested   Honey Thick Honey Thick Liquid: Not tested   Puree Puree: Impaired Presentation: Spoon Pharyngeal Phase Impairments: Cough - Delayed   Solid   GO   Solid: Not tested       Deneise Lever, MS, CCC-SLP Speech-Language Pathologist [phone]  Aliene Altes 08/31/2017,4:36 PM

## 2017-08-31 NOTE — Progress Notes (Signed)
Pharmacy Antibiotic Note  Judith Zuniga is a 84 y.o. female admitted on 08/31/2017 with SOB.  Pharmacy has been consulted for vancomycin and Zosyn dosing for HAP/aspiration PNA.  SCr 1.03, CrCL 21 ml/min, afebrile, WBC WNL.  Plan: Vanc 1gm IV x 1, then 500mg  IV Q24H Zosyn 3.375gm IV x 1, then 2.25gm IV Q8H Monitor renal fxn, clinical progress, abx LOT to decide on vanc trough   Height: 5\' 4"  (162.6 cm) Weight: 91 lb (41.3 kg) IBW/kg (Calculated) : 54.7  Temp (24hrs), Avg:98.9 F (37.2 C), Min:98.9 F (37.2 C), Max:98.9 F (37.2 C)  Recent Labs  Lab 08/31/17 0640  WBC 8.2  CREATININE 1.03*    Estimated Creatinine Clearance: 21.3 mL/min (A) (by C-G formula based on SCr of 1.03 mg/dL (H)).    No Known Allergies  Vanc 5/19 >> Zosyn 5/19 >>  5/19 BCx - 5/19 sputum cx -    Alden Bensinger D. Mina Marble, PharmD, BCPS, BCCCP Pager:  [phone] 08/31/2017, 8:44 AM

## 2017-08-31 NOTE — H&P (Addendum)
History and Physical    Judith Zuniga WVP:[id number] DOB: [birth year] DOA: 08/31/2017  PCP: Wenda Low, MD Patient coming from: [facility]  Chief Complaint: sob  HPI: Judith Zuniga is a demented 84 y.o. female with medical history significant for dementia, peripheral vascular disease status post right below the knee amputation with recent I&D of traumatic hematoma, chronic A. Fib and ischemic cardiomyopathy status post pacemaker placement, type 2 diabetes, skin breakdown, hypertension, presents to the emergency department from her nursing facility with the chief complaint shortness of breath. Initial evaluation includes chest x-ray concerning for infiltrate and/or acute on chronic heart failure. [facility] are asked to admit  Information is obtained from the chart and the patient noting that information from the patient is quite unreliable due to advanced dementia. Per chart review EMS was called to facility for worsening cough and shortness of breath. Reportedly patient had hypoxia upon their arrival. Reportedly her oxygen saturation level was in the 80s. Family reports she has been on oxygen off and on for the last several days at the facility. No previous oxygen dependence. EMS provided DuoNeb labs which improved oxygen saturation level. Upon arrival oxygen saturation level low 90s. No report of any fever nausea vomiting. There is report of increased cough wet sounding but nonproductive due to poor cough effort. Reports of diarrhea. No complaints of dysuria hematuria frequency or urgency.  ED Course: in the emergency department she's afebrile hemodynamically stable not hypoxic. She is provided with IV antibiotics as well as 20 mg of Lasix. The time of admission she is nontoxic appearing  Review of Systems: As per HPI otherwise all other systems reviewed and are negative.   Ambulatory Status: unknown  Past Medical History:  Diagnosis Date  . Atrioventricular block, complete s/p  AV ablation   . CAD (coronary artery disease)    s/p redo bypass surgery in 1998 with patent graft in Jan. 2009.   Marland Kitchen Dementia   . Diabetes mellitus   . Elevated liver function tests    on amiodarone  . Hyperlipidemia   . Hypertension   . Ischemic cardiomyopathy    ischemic heart myopathy , ejection fraction of 25%  . Pacemaker -CRT- [employer]   . Permanent atrial fibrillation   . Premature ventricular contractions    frequent  . Skin breakdown   . Systolic heart failure    class II and euvolemic  . Type II or unspecified type diabetes mellitus without mention of complication, not stated as uncontrolled     Past Surgical History:  Procedure Laterality Date  . AMPUTATION Right 05/11/2014   Procedure: AMPUTATION BELOW KNEE;  Surgeon: Newt Minion, MD;  Location: [facility];  Service: Orthopedics;  Laterality: Right;  . cardiac caths     multile  . CORONARY ARTERY BYPASS GRAFT  1998   CABG x5: RIMA to diagonal one, RIMA to diagonal 2, SVG to PD, SVG to PL, SVG to circ  . CORONARY ARTERY BYPASS GRAFT  1985   x3: LIMA to LAD, SVG to PD and SVG to PL  . pacer generator change out     [employer] pacemaker    Social History   Socioeconomic History  . Marital status: Widowed    Spouse name: Not on file  . Number of children: 1  . Years of education: Not on file  . Highest education level: Not on file  Occupational History  . Occupation: Retired  Scientific laboratory technician  . Financial resource strain: Not on  file  . Food insecurity:    Worry: Not on file    Inability: Not on file  . Transportation needs:    Medical: Not on file    Non-medical: Not on file  Tobacco Use  . Smoking status: Never Smoker  . Smokeless tobacco: Never Used  Substance and Sexual Activity  . Alcohol use: No  . Drug use: No  . Sexual activity: Not on file  Lifestyle  . Physical activity:    Days per week: Not on file    Minutes per session: Not on file  . Stress: Not on file  Relationships  . Social  connections:    Talks on phone: Not on file    Gets together: Not on file    Attends religious service: Not on file    Active member of club or organization: Not on file    Attends meetings of clubs or organizations: Not on file    Relationship status: Not on file  . Intimate partner violence:    Fear of current or ex partner: Not on file    Emotionally abused: Not on file    Physically abused: Not on file    Forced sexual activity: Not on file  Other Topics Concern  . 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     No Known Allergies  Family History  Problem Relation Age of Onset  . Other Mother        pacemaker  . Heart disease Father   . Cancer Sister        gallbladder    Prior to Admission medications   Medication Sig Start Date End Date Taking? Authorizing Provider  apixaban (ELIQUIS) 2.5 MG TABS tablet Take 2.5 mg by mouth 2 (two) times daily.   Yes [provider]  aspirin EC 81 MG EC tablet Take 1 tablet (81 mg total) by mouth daily. 05/15/14  Yes Caren Griffins, MD  atorvastatin (LIPITOR) 40 MG tablet Take 40 mg by mouth daily.   Yes [provider]  bisoprolol (ZEBETA) 10 MG tablet Take 1 tablet (10 mg total) by mouth daily. 12/15/13  Yes Larey Dresser, MD  Cholecalciferol (VITAMIN D-3) 1000 UNITS CAPS Take 2 capsules by mouth daily.   Yes [provider]  ferrous sulfate 325 (65 FE) MG tablet Take 325 mg by mouth 2 (two) times daily with a meal.   Yes [provider]  furosemide (LASIX) 20 MG tablet Take 30 mg by mouth 2 (two) times daily.   Yes [provider]  glimepiride (AMARYL) 1 MG tablet Take 1 mg by mouth daily with breakfast.  06/12/13  Yes [provider]  guaiFENesin (ROBITUSSIN) 100 MG/5ML SOLN Take 15 mLs by mouth 2 (two) times daily as needed for cough or to loosen phlegm.   Yes [provider]    HYDROcodone-acetaminophen (NORCO/VICODIN) 5-325 MG per tablet Take 1 tablet by mouth every 6 (six) hours as needed for moderate pain. 05/15/14  Yes Gherghe, Vella Redhead, MD  levalbuterol (XOPENEX) 0.31 MG/3ML nebulizer solution Take 1 ampule by nebulization 3 (three) times daily.   Yes [provider]  levalbuterol (XOPENEX) 0.63 MG/3ML nebulizer solution Take 0.63 mg by nebulization every 6 (six) hours as needed for wheezing or shortness of breath.   Yes [provider]  losartan (COZAAR) 50 MG tablet Take 50 mg by mouth daily.  08/23/11  Yes Larey Dresser, MD  magnesium hydroxide (MILK OF MAGNESIA) 400 MG/5ML suspension Take 30 mLs by mouth daily as needed for mild constipation.   Yes [provider]  Memantine HCl ER (NAMENDA XR) 28 MG CP24 Take 28 mg by mouth daily.    Yes [provider]  nitroGLYCERIN (NITRODUR - DOSED IN MG/24 HR) 0.2 mg/hr patch Place 0.2 mg onto the skin daily.   Yes [provider]  oxyCODONE-acetaminophen (PERCOCET/ROXICET) 5-325 MG tablet Take 1 tablet by mouth every 6 (six) hours as needed for pain. 08/13/16  Yes [provider]  oxymetazoline (AFRIN) 0.05 % nasal spray Place 1 spray into both nostrils 2 (two) times daily as needed for congestion.   Yes [provider]  pantoprazole (PROTONIX) 40 MG tablet Take 40 mg by mouth daily. 08/01/16  Yes [provider]  Phenylephrine-DM-GG (ROBITUSSIN COUGH/COLD CF PO) Take 15 mLs by mouth 3 (three) times daily.   Yes [provider]  polyethylene glycol (MIRALAX / GLYCOLAX) packet Take 17 g by mouth daily.   Yes [provider]  potassium chloride (KLOR-CON) 20 MEQ packet Take 20 mEq by mouth 2 (two) times daily.   Yes [provider]  senna-docusate (SENOKOT-S) 8.6-50 MG tablet Take 1 tablet by mouth daily as needed for mild constipation.   Yes [provider]  sertraline (ZOLOFT) 25 MG tablet Take 25 mg by mouth daily.    Yes [provider]  sodium chloride (OCEAN) 0.65 % SOLN nasal spray Place 1 spray into both nostrils 4 (four) times daily.   Yes [provider]  UNABLE TO FIND Take 120 mLs by mouth 4 (four) times daily. Med Name: MedPass   Yes [provider]    Physical Exam: Vitals:   08/31/17 0930 08/31/17 0945 08/31/17 1000 08/31/17 1015  BP: 108/65 (!) 106/48 (!) 111/56 (!) 106/54  Pulse: (!) 38 64 69 67  Resp: (!) 27 (!) 22 16 (!) 21  Temp:      TempSrc:      SpO2: 97% 98% 99% 97%  Weight:      Height:         General:  Appears quite thin and frail appearing obviously chronically ill somewhat pale temporal wasting Eyes:  PERRL, EOMI, normal lids, iris ENT:  grossly normal hearing, lips & tongue, mucous membranes of her mouth are somewhat pale but moist Neck:  no LAD, masses or thyromegaly Cardiovascular:  Irregularly irregular, no m/r/g. No LE edema on left. Right below the knee amputation dressing dry and intact Respiratory:  No increased work of breathing. Respirations are quite shallow. Breath sounds are coarse with scattered rhonchi I hear no crackles no wheezes. Very poor cough effort. She has a moist sounding nonproductive cough Abdomen:  soft, ntnd, positive bowel sounds no guarding or rebounding Skin:  Right below the knee amputation dressing dry and intact foot with skin breakdown over her bunion in addition stage II pressure ulcer over mid lateral left foot, TTP with Lonnel Gjerde eschar no signs of any infection no drainage from these areas no drainage Musculoskeletal:  grossly normal tone BUE/BLE, good ROM, no bony abnormality Psychiatric:  grossly normal mood and affect, speech fluent and appropriate, AOx3 Neurologic:  Alert oriented to self only unable to follow simple commands not sure she is able to make her wants and needs known.  Labs on Admission: I have personally reviewed following labs and imaging studies  CBC: Recent Labs  Lab 08/31/17  0640  WBC  8.2  NEUTROABS 5.3  HGB 7.4*  HCT 23.9*  MCV 99.6  PLT 235   Basic Metabolic Panel: Recent Labs  Lab 08/31/17 0640  NA 143  K 3.7  CL 107  CO2 26  GLUCOSE 95  BUN 31*  CREATININE 1.03*  CALCIUM 8.4*   GFR: Estimated Creatinine Clearance: 21.3 mL/min (A) (by C-G formula based on SCr of 1.03 mg/dL (H)). Liver Function Tests: No results for input(s): AST, ALT, ALKPHOS, BILITOT, PROT, ALBUMIN in the last 168 hours. No results for input(s): LIPASE, AMYLASE in the last 168 hours. No results for input(s): AMMONIA in the last 168 hours. Coagulation Profile: No results for input(s): INR, PROTIME in the last 168 hours. Cardiac Enzymes: No results for input(s): CKTOTAL, CKMB, CKMBINDEX, TROPONINI in the last 168 hours. BNP (last 3 results) No results for input(s): PROBNP in the last 8760 hours. HbA1C: No results for input(s): HGBA1C in the last 72 hours. CBG: No results for input(s): GLUCAP in the last 168 hours. Lipid Profile: No results for input(s): CHOL, HDL, LDLCALC, TRIG, CHOLHDL, LDLDIRECT in the last 72 hours. Thyroid Function Tests: No results for input(s): TSH, T4TOTAL, FREET4, T3FREE, THYROIDAB in the last 72 hours. Anemia Panel: No results for input(s): VITAMINB12, FOLATE, FERRITIN, TIBC, IRON, RETICCTPCT in the last 72 hours. Urine analysis:    Component Value Date/Time   COLORURINE STRAW (A) 10/26/2016 1829   APPEARANCEUR CLEAR 10/26/2016 1829   LABSPEC 1.010 10/26/2016 1829   PHURINE 5.0 10/26/2016 1829   GLUCOSEU NEGATIVE 10/26/2016 1829   HGBUR NEGATIVE 10/26/2016 1829   BILIRUBINUR NEGATIVE 10/26/2016 1829   KETONESUR NEGATIVE 10/26/2016 1829   PROTEINUR NEGATIVE 10/26/2016 1829   UROBILINOGEN 0.2 05/05/2014 0156   NITRITE NEGATIVE 10/26/2016 1829   LEUKOCYTESUR NEGATIVE 10/26/2016 1829    Creatinine Clearance: Estimated Creatinine Clearance: 21.3 mL/min (A) (by C-G formula based on SCr of 1.03 mg/dL (H)).  Sepsis  Labs: @LABRCNTIP (procalcitonin:4,lacticidven:4) )No results found for this or any previous visit (from the past 240 hour(s)).   Radiological Exams on Admission: Dg Chest 2 View  Result Date: 08/31/2017 CLINICAL DATA:  Possible pneumonia versus pulmonary edema. Dementia. EXAM: CHEST - 2 VIEW COMPARISON:  10/26/2016 FINDINGS: Sternotomy wires and left-sided pacemaker unchanged. Slight elevation of the left hemidiaphragm. Mild left basilar opacification slightly worse and may be due to atelectasis or infection. Mild prominence of the central pulmonary vasculature likely mild degree of vascular congestion. Stable mild cardiomegaly. Remainder of the exam is unchanged. IMPRESSION: Slight worsening left base opacification likely atelectasis or infection. Mild cardiomegaly with suggestion of minimal vascular congestion. Electronically Signed   By: Marin Olp M.D.   On: 08/31/2017 07:32    EKG: Independently reviewed VENTRICULAR PACING with pvcs  Assessment/Plan Principal Problem:   HCAP (healthcare-associated pneumonia) Active Problems:   Atrial fibrillation (HCC)   Acute on chronic diastolic heart failure (HCC)   Atherosclerosis of native arteries of the extremities with ulceration (HCC)   Chronic atrial fibrillation (HCC)   Diabetes mellitus (HCC)   Anemia   Dementia   Skin breakdown   PACEMAKER, CRT-[employer]   #1. Healthcare-acquired pneumonia. Pt resident of [facility]. Chest x-ray with slight worsening of left base opacification likely atelectasis or infection as well as mild cardiomegaly with suggestion of minimal vascular congestion. He is afebrile with no leukocytosis.has a very moist sounding cough unable to produce mucus due to poor cough effort. reports of hypoxia at facility. At time of admission oxygen saturation level greater  than 90% on room air -Admit -Follow blood cultures -obtain sputum culture as able -Scheduled nebulizers -continue IV antibiotics for now  #2. Acute  on chronic diastolic heart failure. Hx cardiomyopathy as well s/p pacemaker.  BNP greater than 900. Chest x-ray with minimal vascular congestion. Echo done 3 years ago reveals an EF of 81% normal systolic function and moderate aortic stenosis and dilated atrium.home medications include Lasix, BB losartan. She is provided with 20 mg a Lasix IV in the emergency department. -Will provide additional 20 mg of Lasix in 3 hours. -Monitor intake and output -Obtain daily weights -Obtain a 2-D echo -feel the shortness of breath is more related to #1 but heart failure contributing -re-evaluate need for IV lasix tomorrow  #3. Atrial fibrillation. S/p pacemaker. [place] score 4 ekg as noted above. Rate controlled Home medications include eliquis -continue home meds -Monitor  #4.atherosclerosis of native arteries in the extremities with ulceration. Status post right below the knee amputation. Also with several areas skin breakdown left foot. Appears stable at baseline -continue home meds -wound consult  #5.diabetes.serum glucose 95 on admission. Home medications include Amaryl -Hold Amaryl for now -Sliding scale insulin for optimal control -obtain a hemoglobin A1c  #6. Anemia. Hemoglobin 7.4 on admission. Chart review indicates her hemoglobin was 12 less than a year ago. No s/sx bleeding -fobt -anemia panel -may need op follow up  #7. Dementia. Appears to be stable at baseline  #8. Skin breakdown -wound consult    DVT prophylaxis: eliquis  Code Status: dnr  Family Communication: none present  Disposition Plan: back to facility  Consults called: wound care  Admission status: inpatient    Radene Gunning MD [facility]  If 7PM-7AM, please contact night-coverage [email or web address] Password [facility]  08/31/2017, 10:44 AM

## 2017-08-31 NOTE — ED Provider Notes (Addendum)
[facility] EMERGENCY DEPARTMENT Provider Note   CSN: [id number] Arrival date & time: 08/31/17  0607     History   Chief Complaint Chief Complaint  Patient presents with  . Shortness of Breath   Level 5 caveat due to dementia History gathered by EMS, EMR, daughter who is at bedside  HPI Judith Zuniga is a 84 y.o. female BIB EMS for hypoxia and cough. Patient has a past medical history of dementia, peripheral vascular disease, status post right below-knee amputation with recent I&D of traumatic hematoma, status post pacemaker placement for chronic atrial fibrillation and ischemic cardiomyopathy, type 2 diabetes.  The patient has had a rhonchorous cough for the past several days.  Recent labs to work showed a significantly elevated BNP and chest x-ray revealed patchy infiltrates described as "pneumonia versus pulmonary edema."  She was noted to be hypoxic this morning into the high 80s.  Her daughter states that she has been on and off nasal cannula oxygen at her nursing facility for the past several days as well.  She has no previous oxygen dependence.  She was treated with a DuoNeb earlier which improved her oxygen saturation into the low 90s. She has been afebrile  HPI  Past Medical History:  Diagnosis Date  . Atrioventricular block, complete s/p AV ablation   . CAD (coronary artery disease)    s/p redo bypass surgery in 1998 with patent graft in Jan. 2009.   Marland Kitchen Dementia   . Diabetes mellitus   . Elevated liver function tests    on amiodarone  . Hyperlipidemia   . Hypertension   . Ischemic cardiomyopathy    ischemic heart myopathy , ejection fraction of 25%  . Pacemaker -CRT- [employer]   . Permanent atrial fibrillation   . Premature ventricular contractions    frequent  . Skin breakdown   . Systolic heart failure    class II and euvolemic  . Type II or unspecified type diabetes mellitus without mention of complication, not stated as uncontrolled      Patient Active Problem List   Diagnosis Date Noted  . CAP (community acquired pneumonia) 08/31/2017  . Anemia 08/31/2017  . HCAP (healthcare-associated pneumonia) 08/31/2017  . Dementia   . Skin breakdown   . Hematemesis 07/27/2016  . Stroke ([place])   . HLD (hyperlipidemia)   . Delirium 05/05/2014  . History of stroke 05/05/2014  . Ischemic cardiomyopathy 05/05/2014  . Chronic atrial fibrillation ([place]) 05/05/2014  . Diabetes mellitus ([place]) 05/05/2014  . Systolic heart failure 95/62/1308  . Chronic systolic heart failure ([place])   . DM (diabetes mellitus) type II uncontrolled, periph vascular disorder ([place])   . Orthostatic hypotension 07/28/2013  . Encounter for therapeutic drug monitoring 07/13/2013  . Multiple pulmonary nodules 12/13/2011  . Atherosclerosis of native arteries of the extremities with ulceration ([place]) 07/09/2011  . Syncope 05/01/2011  . Acute on chronic diastolic heart failure ([place]) 10/08/2010  . Mitral regurgitation 10/08/2010  . Long term current use of anticoagulant 05/16/2010  . ATAXIA 11/14/2009  . CAROTID ARTERY DISEASE 10/17/2009  . AODM 04/09/2008  . Hyperlipidemia 04/09/2008  . Benign hypertensive heart disease with heart failure ([place]) 04/09/2008  . CAD, ARTERY BYPASS GRAFT 04/09/2008  . Atrial fibrillation ([place]) 04/09/2008  . PACEMAKER, CRT-[employer] 04/09/2008    Past Surgical History:  Procedure Laterality Date  . AMPUTATION Right 05/11/2014   Procedure: AMPUTATION BELOW KNEE;  Surgeon: Newt Minion, MD;  Location: [facility];  Service: Orthopedics;  Laterality: Right;  . cardiac caths     multile  . CORONARY ARTERY BYPASS GRAFT  1998   CABG x5: RIMA to diagonal one, RIMA to diagonal 2, SVG to PD, SVG to PL, SVG to circ  . CORONARY ARTERY BYPASS GRAFT  1985   x3: LIMA to LAD, SVG to PD and SVG to PL  . pacer generator change out     St. Jude pacemaker     OB History   None      Home Medications    Prior to Admission medications    Medication Sig Start Date End Date Taking? Authorizing Provider  apixaban (ELIQUIS) 2.5 MG TABS tablet Take 2.5 mg by mouth 2 (two) times daily.   Yes [provider]  aspirin EC 81 MG EC tablet Take 1 tablet (81 mg total) by mouth daily. 05/15/14  Yes Caren Griffins, MD  atorvastatin (LIPITOR) 40 MG tablet Take 40 mg by mouth daily.   Yes [provider]  bisoprolol (ZEBETA) 10 MG tablet Take 1 tablet (10 mg total) by mouth daily. 12/15/13  Yes Larey Dresser, MD  Cholecalciferol (VITAMIN D-3) 1000 UNITS CAPS Take 2 capsules by mouth daily.   Yes [provider]  ferrous sulfate 325 (65 FE) MG tablet Take 325 mg by mouth 2 (two) times daily with a meal.   Yes [provider]  furosemide (LASIX) 20 MG tablet Take 30 mg by mouth 2 (two) times daily.   Yes [provider]  glimepiride (AMARYL) 1 MG tablet Take 1 mg by mouth daily with breakfast.  06/12/13  Yes [provider]  guaiFENesin (ROBITUSSIN) 100 MG/5ML SOLN Take 15 mLs by mouth 2 (two) times daily as needed for cough or to loosen phlegm.   Yes [provider]  HYDROcodone-acetaminophen (NORCO/VICODIN) 5-325 MG per tablet Take 1 tablet by mouth every 6 (six) hours as needed for moderate pain. 05/15/14  Yes Gherghe, Vella Redhead, MD  levalbuterol (XOPENEX) 0.31 MG/3ML nebulizer solution Take 1 ampule by nebulization 3 (three) times daily.   Yes [provider]  levalbuterol (XOPENEX) 0.63 MG/3ML nebulizer solution Take 0.63 mg by nebulization every 6 (six) hours as needed for wheezing or shortness of breath.   Yes [provider]  losartan (COZAAR) 50 MG tablet Take 50 mg by mouth daily.  08/23/11  Yes Larey Dresser, MD  magnesium hydroxide (MILK OF MAGNESIA) 400 MG/5ML suspension Take 30 mLs by mouth daily as needed for mild constipation.   Yes [provider]  Memantine HCl ER (NAMENDA XR) 28 MG CP24 Take 28 mg by mouth daily.    Yes [provider]  nitroGLYCERIN (NITRODUR - DOSED IN MG/24 HR) 0.2 mg/hr patch Place 0.2 mg onto the skin daily.   Yes [provider]  oxyCODONE-acetaminophen (PERCOCET/ROXICET) 5-325 MG tablet Take 1 tablet by mouth every 6 (six) hours as needed for pain. 08/13/16  Yes [provider]  oxymetazoline (AFRIN) 0.05 % nasal spray Place 1 spray into both nostrils 2 (two) times daily as needed for congestion.   Yes [provider]  pantoprazole (PROTONIX) 40 MG tablet Take 40 mg by mouth daily. 08/01/16  Yes [provider]  Phenylephrine-DM-GG (ROBITUSSIN COUGH/COLD CF PO) Take 15 mLs by mouth 3 (three) times daily.   Yes [provider]  polyethylene glycol (MIRALAX / GLYCOLAX) packet Take 17 g by mouth daily.   Yes [provider]  potassium chloride (KLOR-CON) 20 MEQ packet  Take 20 mEq by mouth 2 (two) times daily.   Yes [provider]  senna-docusate (SENOKOT-S) 8.6-50 MG tablet Take 1 tablet by mouth daily as needed for mild constipation.   Yes [provider]  sertraline (ZOLOFT) 25 MG tablet Take 25 mg by mouth daily.   Yes [provider]  sodium chloride (OCEAN) 0.65 % SOLN nasal spray Place 1 spray into both nostrils 4 (four) times daily.   Yes [provider]  UNABLE TO FIND Take 120 mLs by mouth 4 (four) times daily. Med Name: MedPass   Yes [provider]    Family History Family History  Problem Relation Age of Onset  . Other Mother        pacemaker  . Heart disease Father   . Cancer Sister        gallbladder    Social History Social History   Tobacco Use  . Smoking status: Never Smoker  . Smokeless tobacco: Never Used  Substance Use Topics  . Alcohol use: No  . Drug use: No     Allergies   Patient has no known allergies.   Review of Systems Review of Systems Unable to review systems due to dementia  Physical Exam Updated Vital Signs BP (!) 102/51   Pulse 62   Temp 98.9 F  (37.2 C) (Oral)   Resp (!) 21   Ht 5\' 4"  (1.626 m)   Wt 41.3 kg (91 lb)   SpO2 94%   BMI 15.62 kg/m   Physical Exam  Constitutional: She is oriented to person, place, and time. She appears well-developed and well-nourished. No distress.  HENT:  Head: Normocephalic and atraumatic.  Eyes: Conjunctivae are normal. No scleral icterus.  Neck: Normal range of motion.  Cardiovascular: Normal rate, regular rhythm and normal heart sounds. Exam reveals no gallop and no friction rub.  No murmur heard. Pulmonary/Chest: Effort normal. No respiratory distress. She has rhonchi.  Thick inspiratory and expiratory ronchi in all lung fields  Abdominal: Soft. Bowel sounds are normal. She exhibits no distension and no mass. There is no tenderness. There is no guarding.  Neurological: She is alert and oriented to person, place, and time.  Skin: Skin is warm and dry. She is not diaphoretic.  R leg s/p BKA, Large, circular open area with formed clot about 6cm in diameter. No signs of infection Left foot with Pressure ulcer over the bunion. TTP with black eschar. Stage 2 pressure ulcer over the mid lateral, left foot. No signs of infection  Psychiatric: Her behavior is normal.  Nursing note and vitals reviewed.    ED Treatments / Results  Labs (all labs ordered are listed, but only abnormal results are displayed) Labs Reviewed  BASIC METABOLIC PANEL - Abnormal; Notable for the following components:      Result Value   BUN 31 (*)    Creatinine, Ser 1.03 (*)    Calcium 8.4 (*)    GFR calc non Af Amer 45 (*)    GFR calc Af Amer 52 (*)    All other components within normal limits  CBC WITH DIFFERENTIAL/PLATELET - Abnormal; Notable for the following components:   RBC 2.40 (*)    Hemoglobin 7.4 (*)    HCT 23.9 (*)    All other components within normal limits  BRAIN NATRIURETIC PEPTIDE - Abnormal; Notable for the following components:   B Natriuretic Peptide 977.5 (*)    All other components within  normal limits  HEMOGLOBIN A1C -  Abnormal; Notable for the following components:   Hgb A1c MFr Bld 5.7 (*)    All other components within normal limits  IRON AND TIBC - Abnormal; Notable for the following components:   TIBC 214 (*)    All other components within normal limits  RETICULOCYTES - Abnormal; Notable for the following components:   RBC. 2.26 (*)    All other components within normal limits  CULTURE, BLOOD (ROUTINE X 2)  CULTURE, BLOOD (ROUTINE X 2)  CULTURE, EXPECTORATED SPUTUM-ASSESSMENT  GRAM STAIN  VITAMIN B12  FOLATE  FERRITIN  STREP PNEUMONIAE URINARY ANTIGEN  OCCULT BLOOD X 1 CARD TO LAB, STOOL  I-STAT TROPONIN, ED  POC OCCULT BLOOD, ED  CBG MONITORING, ED    EKG EKG Interpretation  Date/Time:  Sunday Aug 31 2017 06:14:59 EDT Ventricular Rate:  61 PR Interval:    QRS Duration: 149 QT Interval:  468 QTC Calculation: 472 R Axis:   -121 Text Interpretation:  VENTRICULAR PACING with pvcs Confirmed by Orpah Greek [phone]) on 08/31/2017 7:07:36 AM   Radiology Dg Chest 2 View  Result Date: 08/31/2017 CLINICAL DATA:  Possible pneumonia versus pulmonary edema. Dementia. EXAM: CHEST - 2 VIEW COMPARISON:  10/26/2016 FINDINGS: Sternotomy wires and left-sided pacemaker unchanged. Slight elevation of the left hemidiaphragm. Mild left basilar opacification slightly worse and may be due to atelectasis or infection. Mild prominence of the central pulmonary vasculature likely mild degree of vascular congestion. Stable mild cardiomegaly. Remainder of the exam is unchanged. IMPRESSION: Slight worsening left base opacification likely atelectasis or infection. Mild cardiomegaly with suggestion of minimal vascular congestion. Electronically Signed   By: Marin Olp M.D.   On: 08/31/2017 07:32    Procedures .Critical Care Performed by: Margarita Mail, PA-C Authorized by: Margarita Mail, PA-C   Critical care provider statement:    Critical care time (minutes):   40   Critical care was necessary to treat or prevent imminent or life-threatening deterioration of the following conditions:  Respiratory failure   Critical care was time spent personally by me on the following activities:  Development of treatment plan with patient or surrogate, discussions with consultants, evaluation of patient's response to treatment, examination of patient, interpretation of cardiac output measurements, ordering and performing treatments and interventions, ordering and review of laboratory studies, ordering and review of radiographic studies, pulse oximetry, re-evaluation of patient's condition and review of old charts   (including critical care time)  Medications Ordered in ED Medications  ferrous sulfate tablet 325 mg (325 mg Oral Not Given 08/31/17 0830)  guaiFENesin (ROBITUSSIN) 100 MG/5ML solution 300 mg (has no administration in time range)  HYDROcodone-acetaminophen (NORCO/VICODIN) 5-325 MG per tablet 1 tablet (has no administration in time range)  magnesium hydroxide (MILK OF MAGNESIA) suspension 30 mL (has no administration in time range)  memantine (NAMENDA XR) 24 hr capsule 28 mg (0 mg Oral Hold 08/31/17 1347)  oxymetazoline (AFRIN) 0.05 % nasal spray 1 spray (has no administration in time range)  potassium chloride 20 MEQ/15ML (10%) solution 20 mEq (20 mEq Oral Given 08/31/17 1301)  senna-docusate (Senokot-S) tablet 1 tablet (has no administration in time range)  sodium chloride flush (NS) 0.9 % injection 3 mL (has no administration in time range)  sodium chloride flush (NS) 0.9 % injection 3 mL (has no administration in time range)  0.9 %  sodium chloride infusion (has no administration in time range)  acetaminophen (TYLENOL) tablet 650 mg (has no administration in time range)  ondansetron (ZOFRAN) injection 4 mg (  has no administration in time range)  insulin aspart (novoLOG) injection 0-5 Units (has no administration in time range)  insulin aspart (novoLOG)  injection 0-9 Units (0 Units Subcutaneous Not Given 08/31/17 1342)  vancomycin (VANCOCIN) 500 mg in sodium chloride 0.9 % 100 mL IVPB (has no administration in time range)  piperacillin-tazobactam (ZOSYN) IVPB 2.25 g (has no administration in time range)  albuterol (PROVENTIL) (2.5 MG/3ML) 0.083% nebulizer solution 2.5 mg (2.5 mg Nebulization Given 08/31/17 1304)  metoprolol tartrate (LOPRESSOR) injection 5 mg (5 mg Intravenous Not Given 08/31/17 1551)  enoxaparin (LOVENOX) injection 40 mg (has no administration in time range)  furosemide (LASIX) injection 20 mg (20 mg Intravenous Given 08/31/17 0822)  vancomycin (VANCOCIN) IVPB 1000 mg/200 mL premix (0 mg Intravenous Stopped 08/31/17 0942)  piperacillin-tazobactam (ZOSYN) IVPB 3.375 g (0 g Intravenous Stopped 08/31/17 0902)  furosemide (LASIX) injection 20 mg (20 mg Intravenous Given 08/31/17 1246)     Initial Impression / Assessment and Plan / ED Course  I have reviewed the triage vital signs and the nursing notes.  Pertinent labs & imaging results that were available during my care of the patient were reviewed by me and considered in my medical decision making (see chart for details).  Clinical Course as of Sep 01 1635  Sun Aug 31, 2017  0807 Patient BNP elevated, will give 20 of IV lasix.    B Natriuretic Peptide(!): 977.5 [AH]  0807 Last HGB 11.5 05/14/2017 on patient's MAR paperwork. Discussed with daughter at bedside. She does not wish to pursue work up or treat patient's anemia with transfusion. She does have a MOST form filled out at bedside along with DNR paperwork.   Hemoglobin(!): 7.4 [AH]  0810 Daughter does wish to treat patient with ABX, supportive care and pulmonary toilet.   [AH]  [phone] EKG reviewed - no signs of ischemia   [AH]    Clinical Course User Index [AH] Margarita Mail, PA-C    Patient presents in mild respiratory distress. DDX includes ACS, pnuemonia, aspiration, pulmonary edema. EKG without changes or signs of  ischemia. Currently suspect Pulmonary edema versus pneumonia.  She is being treated as both.  She will need admission.  Her breathing has improved here in the emergency department.  Final Clinical Impressions(s) / ED Diagnoses   Final diagnoses:  HAP (hospital-acquired pneumonia)  Hypoxia  Acute pulmonary edema ([place])  Anemia, unspecified type    ED Discharge Orders    None       Margarita Mail, PA-C 08/31/17 1637    Orpah Greek, MD 09/01/17 0202    Margarita Mail, PA-C 09/15/17 2094    Orpah Greek, MD 09/15/17 [phone]

## 2017-08-31 NOTE — ED Notes (Signed)
Called pharmacy for zosyn. 

## 2017-08-31 NOTE — Progress Notes (Signed)
ANTICOAGULATION CONSULT NOTE - Initial Consult  Pharmacy Consult:  Eliquis >> Lovenox Indication: atrial fibrillation  No Known Allergies  Patient Measurements: Height: 5\' 4"  (162.6 cm) Weight: 91 lb (41.3 kg) IBW/kg (Calculated) : 54.7   Vital Signs: Temp: 98.9 F (37.2 C) (05/19 0617) Temp Source: Oral (05/19 0617) BP: 106/54 (05/19 1015) Pulse Rate: 67 (05/19 1015)  Labs: Recent Labs    08/31/17 0640  HGB 7.4*  HCT 23.9*  PLT 191  CREATININE 1.03*    Estimated Creatinine Clearance: 21.3 mL/min (A) (by C-G formula based on SCr of 1.03 mg/dL (H)).   Medical History: Past Medical History:  Diagnosis Date  . Atrioventricular block, complete s/p AV ablation   . CAD (coronary artery disease)    s/p redo bypass surgery in 1998 with patent graft in Jan. 2009.   Marland Kitchen Dementia   . Diabetes mellitus   . Elevated liver function tests    on amiodarone  . Hyperlipidemia   . Hypertension   . Ischemic cardiomyopathy    ischemic heart myopathy , ejection fraction of 25%  . Pacemaker -CRT- [employer]   . Permanent atrial fibrillation   . Premature ventricular contractions    frequent  . Skin breakdown   . Systolic heart failure    class II and euvolemic  . Type II or unspecified type diabetes mellitus without mention of complication, not stated as uncontrolled       Assessment: 89 YOF presented with breathing difficulty and started on antibiotics for HAP/aspiration PNA.  Patient has a history of Afib on Eliquis PTA and continued here (last dose 08/31/17 around 1030).  She is NPO and Pharmacy consulted to transition patient to Lovenox.  SCr 1.03 and CrCL < 30 ml/min.  Hemoglobin/hematocrit are low; plt count WNL; no bleeding reported.  Weight = 41 kg.    Goal of Therapy:  Anti-Xa level 0.6-1 units/ml 4hrs after LMWH dose given Monitor platelets by anticoagulation protocol: Yes    Plan:  Lovenox 40mg  SQ Q24H, start tonight CBC Q72H while on Lovenox   Layden Caterino D. Mina Marble,  PharmD, BCPS, [place] Pager:  [phone] 08/31/2017, 2:06 PM

## 2017-09-01 ENCOUNTER — Other Ambulatory Visit: Payer: Self-pay

## 2017-09-01 ENCOUNTER — Inpatient Hospital Stay (HOSPITAL_COMMUNITY): Payer: Medicare Other

## 2017-09-01 DIAGNOSIS — L909 Atrophic disorder of skin, unspecified: Secondary | ICD-10-CM

## 2017-09-01 DIAGNOSIS — Z515 Encounter for palliative care: Secondary | ICD-10-CM

## 2017-09-01 DIAGNOSIS — I5033 Acute on chronic diastolic (congestive) heart failure: Secondary | ICD-10-CM

## 2017-09-01 DIAGNOSIS — J810 Acute pulmonary edema: Secondary | ICD-10-CM

## 2017-09-01 DIAGNOSIS — Z7189 Other specified counseling: Secondary | ICD-10-CM

## 2017-09-01 DIAGNOSIS — J189 Pneumonia, unspecified organism: Secondary | ICD-10-CM

## 2017-09-01 DIAGNOSIS — I340 Nonrheumatic mitral (valve) insufficiency: Secondary | ICD-10-CM

## 2017-09-01 LAB — GLUCOSE, CAPILLARY
GLUCOSE-CAPILLARY: 91 mg/dL (ref 65–99)
GLUCOSE-CAPILLARY: 81 mg/dL (ref 65–99)
GLUCOSE-CAPILLARY: 86 mg/dL (ref 65–99)
Glucose-Capillary: 90 mg/dL (ref 65–99)

## 2017-09-01 LAB — COMPREHENSIVE METABOLIC PANEL
ALK PHOS: 83 U/L (ref 38–126)
ALT: 13 U/L — ABNORMAL LOW (ref 14–54)
ANION GAP: 11 (ref 5–15)
AST: 21 U/L (ref 15–41)
Albumin: 2.6 g/dL — ABNORMAL LOW (ref 3.5–5.0)
BILIRUBIN TOTAL: 0.9 mg/dL (ref 0.3–1.2)
BUN: 23 mg/dL — ABNORMAL HIGH (ref 6–20)
CALCIUM: 8.8 mg/dL — AB (ref 8.9–10.3)
CO2: 30 mmol/L (ref 22–32)
Chloride: 103 mmol/L (ref 101–111)
Creatinine, Ser: 1.12 mg/dL — ABNORMAL HIGH (ref 0.44–1.00)
GFR calc non Af Amer: 40 mL/min — ABNORMAL LOW (ref >60)
GFR, EST AFRICAN AMERICAN: 47 mL/min — AB (ref >60)
Glucose, Bld: 94 mg/dL (ref 65–99)
POTASSIUM: 3.7 mmol/L (ref 3.5–5.1)
SODIUM: 144 mmol/L (ref 135–145)
TOTAL PROTEIN: 7 g/dL (ref 6.5–8.1)

## 2017-09-01 LAB — CBC
HCT: 30 % — ABNORMAL LOW (ref 36.0–46.0)
HEMOGLOBIN: 9.3 g/dL — AB (ref 12.0–15.0)
MCH: 30.7 pg (ref 26.0–34.0)
MCHC: 31 g/dL (ref 30.0–36.0)
MCV: 99 fL (ref 78.0–100.0)
Platelets: 173 10*3/uL (ref 150–400)
RBC: 3.03 MIL/uL — ABNORMAL LOW (ref 3.87–5.11)
RDW: 14.9 % (ref 11.5–15.5)
WBC: 6.3 10*3/uL (ref 4.0–10.5)

## 2017-09-01 LAB — ECHOCARDIOGRAM COMPLETE
HEIGHTINCHES: 63 in
WEIGHTICAEL: 1920 [oz_av]

## 2017-09-01 MED ORDER — PANTOPRAZOLE SODIUM 40 MG PO TBEC
40.0000 mg | DELAYED_RELEASE_TABLET | Freq: Every day | ORAL | Status: DC
  Administered 2017-09-01 – 2017-09-03 (×3): 40 mg via ORAL
  Filled 2017-09-01 (×3): qty 1

## 2017-09-01 MED ORDER — FUROSEMIDE 10 MG/ML IJ SOLN
20.0000 mg | Freq: Once | INTRAMUSCULAR | Status: AC
  Administered 2017-09-01: 20 mg via INTRAVENOUS
  Filled 2017-09-01: qty 2

## 2017-09-01 MED ORDER — FAMOTIDINE IN NACL 20-0.9 MG/50ML-% IV SOLN
20.0000 mg | INTRAVENOUS | Status: DC
  Administered 2017-09-02 – 2017-09-03 (×2): 20 mg via INTRAVENOUS
  Filled 2017-09-01 (×2): qty 50

## 2017-09-01 MED ORDER — BISOPROLOL FUMARATE 5 MG PO TABS
10.0000 mg | ORAL_TABLET | Freq: Every day | ORAL | Status: DC
  Administered 2017-09-01 – 2017-09-03 (×3): 10 mg via ORAL
  Filled 2017-09-01 (×3): qty 2

## 2017-09-01 MED ORDER — FAMOTIDINE IN NACL 20-0.9 MG/50ML-% IV SOLN
20.0000 mg | Freq: Two times a day (BID) | INTRAVENOUS | Status: DC
  Administered 2017-09-01: 20 mg via INTRAVENOUS
  Filled 2017-09-01: qty 50

## 2017-09-01 MED ORDER — ASPIRIN EC 81 MG PO TBEC
81.0000 mg | DELAYED_RELEASE_TABLET | Freq: Every day | ORAL | Status: DC
  Administered 2017-09-01 – 2017-09-03 (×3): 81 mg via ORAL
  Filled 2017-09-01 (×3): qty 1

## 2017-09-01 MED ORDER — PIPERACILLIN-TAZOBACTAM 3.375 G IVPB
3.3750 g | Freq: Three times a day (TID) | INTRAVENOUS | Status: DC
Start: 2017-09-01 — End: 2017-09-02
  Administered 2017-09-01 – 2017-09-02 (×3): 3.375 g via INTRAVENOUS
  Filled 2017-09-01 (×4): qty 50

## 2017-09-01 MED ORDER — APIXABAN 2.5 MG PO TABS
2.5000 mg | ORAL_TABLET | Freq: Two times a day (BID) | ORAL | Status: DC
  Administered 2017-09-01 – 2017-09-03 (×5): 2.5 mg via ORAL
  Filled 2017-09-01 (×5): qty 1

## 2017-09-01 MED ORDER — ATORVASTATIN CALCIUM 40 MG PO TABS
40.0000 mg | ORAL_TABLET | Freq: Every day | ORAL | Status: DC
  Administered 2017-09-01 – 2017-09-02 (×2): 40 mg via ORAL
  Filled 2017-09-01 (×2): qty 1

## 2017-09-01 NOTE — Clinical Social Work Note (Signed)
Clinical Social Work Assessment  Patient Details  Name: Judith Zuniga MRN: [id number] Date of Birth: 11-Feb-1921  Date of referral:  09/01/17               Reason for consult:  Discharge Planning                Permission sought to share information with:  Facility Sport and exercise psychologist, Family Supports Permission granted to share information::  Yes, Verbal Permission Granted  Name::     Judith Zuniga  Agency::  [facility]  Relationship::  Daughter  Contact Information:  [phone]  Housing/Transportation Living arrangements for the past 2 months:  [place] of Information:  Medical Team, Facility, Adult Children Patient Interpreter Needed:  None Criminal Activity/Legal Involvement Pertinent to Current Situation/Hospitalization:  No - Comment as needed Significant Relationships:  Adult Children Lives with:  Facility Resident Do you feel safe going back to the place where you live?  Yes Need for family participation in patient care:  Yes (Comment)  Care giving concerns:  Patient is a long-term resident at [facility].   Social Worker assessment / plan:  Patient oriented to self only. Daughter at bedside. CSW introduced role and explained that discharge planning would be discussed. Patient's daughter confirmed she was admitted from [facility] and the plan is for her to return at discharge. No further concerns. CSW encouraged patient's daughter to contact CSW as needed. CSW will continue to follow patient and her daughter for support and facilitate discharge back to SNF once medically stable.  Employment status:  Retired Forensic scientist:  Medicare PT Recommendations:  Not assessed at this time [place] / Referral to community resources:  [place]  Patient/Family's Response to care:  Patient only oriented to self. Patient's daughter agreeable to return to SNF. Patient's daughter supportive and involved in patient's care.  Patient's daughter appreciated social work intervention.  Patient/Family's Understanding of and Emotional Response to Diagnosis, Current Treatment, and Prognosis:  Patient only oriented to self. Patient's daughter has a good understanding of the reason for admission and plan to return to SNF at discharge. Patient's daughter appears happy with hospital care.  Emotional Assessment Appearance:  Appears stated age Attitude/Demeanor/Rapport:  Unable to Assess Affect (typically observed):  Unable to Assess Orientation:  Oriented to Self Alcohol / Substance use:  Never Used Psych involvement (Current and /or in the community):  No (Comment)  Discharge Needs  Concerns to be addressed:  Care Coordination Readmission within the last 30 days:  No Current discharge risk:  Cognitively Impaired Barriers to Discharge:  Continued Medical Work up   Candie Chroman, LCSW 09/01/2017, 11:56 AM

## 2017-09-01 NOTE — Care Management Note (Signed)
Case Management Note  Patient Details  Name: Judith Zuniga MRN: [id number] Date of Birth: 09/21/1920  Subjective/Objective:    Pneumonia               Action/Plan: PCP: Wenda Low, MD; patient resides in a skilled nursing facility; SW following for placement; CM will continue to follow for progression of care.  Expected Discharge Date:      Possibly 09/05/2017            Expected Discharge Plan:  Skilled Nursing Facility  In-House Referral:  Clinical Social Work  Discharge planning Services  CM Consult  Status of Service:  In process, will continue to follow  Sherrilyn Rist [phone] 09/01/2017, 10:20 AM

## 2017-09-01 NOTE — Progress Notes (Signed)
  Speech Language Pathology Treatment: Dysphagia  Patient Details Name: Judith Zuniga MRN: [id number] DOB: 08/26/1920 Today's Date: 09/01/2017 Time: 1005-1030 SLP Time Calculation (min) (ACUTE ONLY): 25 min  Assessment / Plan / Recommendation Clinical Impression  Patient was asleep when entering room, however she woke and was very agreeable for therapy and PO trials. Patient was given apple sauce and cracker with no clearing or cough and a clear vocal quality. When given trials of thin liquids she exhibited a delayed cough and clear of throat consistently. Patient had no clearing or coughing when presented with nectar thickened liquids. Patients daughter was present and able to provide information previous diet. She was tolerating a regular diet with thin liquids at the SNF, but required full assistance with eating. Recommend a Dys 2 diet with nectar thickened liquids, medication crushed in applesauce and full assistance with feeding. Speech therapy to follow up for diet tolerance.   HPI HPI: 84 year old female with significant dementia, peripheral vascular disease status post right below the knee amputation, chronic atrial fibrillation and ischemic cardiomyopathy status post pacemaker placement, type 2 diabetes mellitus, hypertension presented to the emergency department with fever, and chest x-ray consistent with pneumonia versus mild vascular congestion. SLP evaluated 05/06/14 with findings of normal oropharyngeal swallow function; regular diet and thin liquids was recommended.      SLP Plan  Continue with current plan of care       Recommendations  Diet recommendations: Dysphagia 2 (fine chop);Nectar-thick liquid Medication Administration: Crushed with puree Supervision: Trained caregiver to feed patient Compensations: Minimize environmental distractions;Slow rate;Small sips/bites                Oral Care Recommendations: Oral care QID Follow up Recommendations: Skilled Nursing  facility SLP Visit Diagnosis: Dysphagia, oropharyngeal phase (R13.12) Plan: Continue with current plan of care       [place], MA, CCC-SLP 09/01/2017 10:57 AM

## 2017-09-01 NOTE — Progress Notes (Addendum)
[facility] PROGRESS NOTE  Judith Zuniga IRW:[id number] DOB: 1920-12-05 DOA: 08/31/2017   PCP: Wenda Low, MD     Assessment/Plan: Principal Problem:   HCAP (healthcare-associated pneumonia) Active Problems:   Atrial fibrillation ([place])   PACEMAKER, CRT-St. Jude   Acute on chronic diastolic heart failure ([place])   Atherosclerosis of native arteries of the extremities with ulceration ([place])   Chronic atrial fibrillation ([place])   Diabetes mellitus ([place])   Anemia   Dementia   Skin breakdown   84 y.o. female with medical history significant for dementia, peripheral vascular disease status post right below the knee amputation with recent I&D of traumatic hematoma, chronic A. Fib and ischemic cardiomyopathy status post pacemaker placement, type 2 diabetes, skin breakdown, hypertension, presents to the emergency department from her nursing facility with the chief complaint shortness of breath. Patient found to have  left lower lobe pneumonia, congestive heart failure  Assessment and plan Healthcare associated pneumonia Left basilar opacity, currently on vancomycin and Zosyn, will discontinue vancomycin and blood cultures remain negative in the next 24 hours Urine streptococcal antigen, sputum culture, pending collection Continue scheduled nebulizers Oxygen requirements currently stable on 2 L  Chronic atrial fibrillation -CHADS-VASC is 1 (age x2, CVA x2, gender, CHF, CAD, DM) -Warfarin held in light of hematemesis, rate is well-controlled  -As above, patient is on Eliquis, which explains why INR was subtherapeutic) -Continue bisoprolol  Acute on chronic diastolic heart failure, history of cardiomyopathy status post pacemaker, BNP 900 on admission Given Lasix in the ED, 2-D echo pending Blood pressures off therefore will diurese cautiously  hold Cozaar -Follow I/O's and daily wts -Continue lasix prn  Diabetes mellitus, type II -A1c was 6.3% in Jan '16  Hold  glimeperide  , continue sliding scale insulin  Historyof CVA /dementia Dysphagia 2 diet, nectar thick liquids recommended by speech therapy -No new deficits identified  -ASA held in light of hematemesis, resume Lipitor when able -Continue Eliquis upon discharge, currently transitioned to Lovenox  Anemia,   Hemoglobin was 12.0 on 10/26/16,  7.4 on admission, repeated this morning, now 9.3 Could be contributing to the patient's shortness of breath,   Check Fecal occult Anemia panel shows anemia of chronic disease She has a previous history of GI bleeding last year, will start patient on PPI, and trend CBC     DVT prophylaxsis Lovenox  Code Status:    DO NOT RESUSCITATE  Family Communication: Discussed in detail with the patient, all imaging results, lab results explained to the patient   Disposition Plan:  Continue treatment for pneumonia, likely due 2- 3 days      Consultants:  None  Procedures:  None  Antibiotics: Anti-infectives (From admission, onward)   Start     Dose/Rate Route Frequency Ordered Stop   09/01/17 0900  vancomycin (VANCOCIN) 500 mg in sodium chloride 0.9 % 100 mL IVPB     500 mg 100 mL/hr over 60 Minutes Intravenous Every 24 hours 08/31/17 0845     08/31/17 1400  piperacillin-tazobactam (ZOSYN) IVPB 2.25 g     2.25 g 100 mL/hr over 30 Minutes Intravenous Every 8 hours 08/31/17 0845     08/31/17 0815  vancomycin (VANCOCIN) IVPB 1000 mg/200 mL premix     1,000 mg 200 mL/hr over 60 Minutes Intravenous  Once 08/31/17 0807 08/31/17 0942   08/31/17 0815  piperacillin-tazobactam (ZOSYN) IVPB 3.375 g     3.375 g 100 mL/hr over 30 Minutes Intravenous  Once  08/31/17 0807 08/31/17 0902         HPI/Subjective: Very weak and sleepy, arouses and then mumbles  Objective: Vitals:   08/31/17 1750 08/31/17 2026 08/31/17 2346 09/01/17 0539  BP: 121/74 126/74 (!) 106/55 110/61  Pulse: 65 71 68 61  Resp: 20 18 18 18   Temp: 98 F (36.7 C) 98.2 F  (36.8 C) 97.8 F (36.6 C) 98.2 F (36.8 C)  TempSrc: Oral Oral Oral Oral  SpO2: 97% 100% 100% 100%  Weight:      Height:        Intake/Output Summary (Last 24 hours) at 09/01/2017 0746 Last data filed at 09/01/2017 0604 Gross per 24 hour  Intake 350 ml  Output 450 ml  Net -100 ml    Exam:  Examination:  General exam: Appears calm and comfortable , cachectic Respiratory system: Clear to auscultation. Respiratory effort normal. Cardiovascular system: S1 & S2 heard, RRR. No JVD, murmurs, rubs, gallops or clicks. No pedal edema. Gastrointestinal system: Abdomen is nondistended, soft and nontender. No organomegaly or masses felt. Normal bowel sounds heard. Central nervous system: Somnolent but arousable Extremities: Symmetric 5 x 5 power. Skin: No rashes, lesions or ulcers      Data Reviewed: I have personally reviewed following labs and imaging studies  Micro Results Recent Results (from the past 240 hour(s))  MRSA PCR Screening     Status: Abnormal   Collection Time: 08/31/17  5:58 PM  Result Value Ref Range Status   MRSA by PCR POSITIVE (A) NEGATIVE Final    Comment:        The GeneXpert MRSA Assay (FDA approved for NASAL specimens only), is one component of a comprehensive MRSA colonization surveillance program. It is not intended to diagnose MRSA infection nor to guide or monitor treatment for MRSA infections. RESULT CALLED TO, READ BACK BY AND VERIFIED WITH: GRASUL,RN @2017  08/31/17 BY LHOWARD Performed at [facility], [place] [address], [place], [place] [address]     Radiology Reports Dg Chest 2 View  Result Date: 08/31/2017 CLINICAL DATA:  Possible pneumonia versus pulmonary edema. Dementia. EXAM: CHEST - 2 VIEW COMPARISON:  10/26/2016 FINDINGS: Sternotomy wires and left-sided pacemaker unchanged. Slight elevation of the left hemidiaphragm. Mild left basilar opacification slightly worse and may be due to atelectasis or infection. Mild prominence of the  central pulmonary vasculature likely mild degree of vascular congestion. Stable mild cardiomegaly. Remainder of the exam is unchanged. IMPRESSION: Slight worsening left base opacification likely atelectasis or infection. Mild cardiomegaly with suggestion of minimal vascular congestion. Electronically Signed   By: Marin Olp M.D.   On: 08/31/2017 07:32   Dg Tibia/fibula Right  Result Date: 08/09/2017 CLINICAL DATA:  Unwitnessed fall with swelling and bruising over the region of patient's right below-knee amputation. EXAM: RIGHT TIBIA AND FIBULA - 2 VIEW COMPARISON:  None. FINDINGS: There is diffuse decreased bone mineralization. There are moderate tricompartmental osteoarthritic changes present. Chondrocalcinosis present over the mediolateral compartments. No evidence of acute fracture or dislocation. No significant joint effusion. Moderate calcification of the arterial structures. A few surgical clips are present within the soft tissues. IMPRESSION: No acute findings. Moderate osteoarthritic change.  Atherosclerosis. Electronically Signed   By: Marin Olp M.D.   On: 08/09/2017 16:43   Ct Head Wo Contrast  Result Date: 08/09/2017 CLINICAL DATA:  Pain after fall EXAM: CT HEAD WITHOUT CONTRAST CT CERVICAL SPINE WITHOUT CONTRAST TECHNIQUE: Multidetector CT imaging of the head and cervical spine was performed following the standard protocol  without intravenous contrast. Multiplanar CT image reconstructions of the cervical spine were also generated. COMPARISON:  June 14, 2016 FINDINGS: CT HEAD FINDINGS Brain: No subdural, epidural, or subarachnoid hemorrhage. The brainstem and basal cisterns are normal. Cerebellum is normal. Ventricles and sulci are prominent but stable. White matter changes are identified most prominent the frontal lobes bilaterally, stable on the left and increased on the right in the interval. The overlying frontal cortex appears is mildly involved on the right. While this is new since  March of 2018, it is likely nonacute. No acute cortical ischemia is noted. No mass effect or midline shift. Vascular: Calcified atherosclerosis is seen in the intracranial carotids. Skull: Normal. Negative for fracture or focal lesion. Sinuses/Orbits: No acute finding. Other: None. CT CERVICAL SPINE FINDINGS Alignment: No change in alignment. Mild anterolisthesis C3 versus C4 is stable. Mild kyphosis at C4 and C5 is stable. Anterolisthesis of C6 versus C7 is stable. Widening of the anterior C6-7 disc space is also stable. No acute fractures identified. Skull base and vertebrae: No acute fractures identified. Soft tissues and spinal canal: No prevertebral fluid or swelling. No visible canal hematoma. Disc levels:  Multilevel degenerative changes. Upper chest: Negative. Other: No other abnormalities are identified. IMPRESSION: 1. No acute intracranial abnormality. 2. No acute traumatic malalignment or fracture. Multilevel degenerative changes. Electronically Signed   By: Dorise Bullion III M.D   On: 08/09/2017 14:43   Ct Cervical Spine Wo Contrast  Result Date: 08/09/2017 CLINICAL DATA:  Pain after fall EXAM: CT HEAD WITHOUT CONTRAST CT CERVICAL SPINE WITHOUT CONTRAST TECHNIQUE: Multidetector CT imaging of the head and cervical spine was performed following the standard protocol without intravenous contrast. Multiplanar CT image reconstructions of the cervical spine were also generated. COMPARISON:  June 14, 2016 FINDINGS: CT HEAD FINDINGS Brain: No subdural, epidural, or subarachnoid hemorrhage. The brainstem and basal cisterns are normal. Cerebellum is normal. Ventricles and sulci are prominent but stable. White matter changes are identified most prominent the frontal lobes bilaterally, stable on the left and increased on the right in the interval. The overlying frontal cortex appears is mildly involved on the right. While this is new since March of 2018, it is likely nonacute. No acute cortical ischemia is  noted. No mass effect or midline shift. Vascular: Calcified atherosclerosis is seen in the intracranial carotids. Skull: Normal. Negative for fracture or focal lesion. Sinuses/Orbits: No acute finding. Other: None. CT CERVICAL SPINE FINDINGS Alignment: No change in alignment. Mild anterolisthesis C3 versus C4 is stable. Mild kyphosis at C4 and C5 is stable. Anterolisthesis of C6 versus C7 is stable. Widening of the anterior C6-7 disc space is also stable. No acute fractures identified. Skull base and vertebrae: No acute fractures identified. Soft tissues and spinal canal: No prevertebral fluid or swelling. No visible canal hematoma. Disc levels:  Multilevel degenerative changes. Upper chest: Negative. Other: No other abnormalities are identified. IMPRESSION: 1. No acute intracranial abnormality. 2. No acute traumatic malalignment or fracture. Multilevel degenerative changes. Electronically Signed   By: Dorise Bullion III M.D   On: 08/09/2017 14:43     CBC Recent Labs  Lab 08/31/17 0640  WBC 8.2  HGB 7.4*  HCT 23.9*  PLT 191  MCV 99.6  MCH 30.8  MCHC 31.0  RDW 15.1  LYMPHSABS 2.1  MONOABS 0.6  EOSABS 0.2  BASOSABS 0.0    Chemistries  Recent Labs  Lab 08/31/17 0640  NA 143  K 3.7  CL 107  CO2 26  GLUCOSE 95  BUN 31*  CREATININE 1.03*  CALCIUM 8.4*   ------------------------------------------------------------------------------------------------------------------ estimated creatinine clearance is 25.9 mL/min (A) (by C-G formula based on SCr of 1.03 mg/dL (H)). ------------------------------------------------------------------------------------------------------------------ Recent Labs    08/31/17 1015  HGBA1C 5.7*   ------------------------------------------------------------------------------------------------------------------ No results for input(s): CHOL, HDL, LDLCALC, TRIG, CHOLHDL, LDLDIRECT in the last 72  hours. ------------------------------------------------------------------------------------------------------------------ No results for input(s): TSH, T4TOTAL, T3FREE, THYROIDAB in the last 72 hours.  Invalid input(s): FREET3 ------------------------------------------------------------------------------------------------------------------ Recent Labs    08/31/17 1015  VITAMINB12 328  FOLATE 20.4  FERRITIN 182  TIBC 214*  IRON 29  RETICCTPCT 1.0    Coagulation profile No results for input(s): INR, PROTIME in the last 168 hours.  No results for input(s): DDIMER in the last 72 hours.  Cardiac Enzymes No results for input(s): CKMB, TROPONINI, MYOGLOBIN in the last 168 hours.  Invalid input(s): CK ------------------------------------------------------------------------------------------------------------------ Invalid input(s): POCBNP   CBG: Recent Labs  Lab 08/31/17 1313 08/31/17 2145  GLUCAP 92 99       Studies: Dg Chest 2 View  Result Date: 08/31/2017 CLINICAL DATA:  Possible pneumonia versus pulmonary edema. Dementia. EXAM: CHEST - 2 VIEW COMPARISON:  10/26/2016 FINDINGS: Sternotomy wires and left-sided pacemaker unchanged. Slight elevation of the left hemidiaphragm. Mild left basilar opacification slightly worse and may be due to atelectasis or infection. Mild prominence of the central pulmonary vasculature likely mild degree of vascular congestion. Stable mild cardiomegaly. Remainder of the exam is unchanged. IMPRESSION: Slight worsening left base opacification likely atelectasis or infection. Mild cardiomegaly with suggestion of minimal vascular congestion. Electronically Signed   By: Marin Olp M.D.   On: 08/31/2017 07:32      Lab Results  Component Value Date   HGBA1C 5.7 (H) 08/31/2017   HGBA1C 6.1 (H) 07/27/2016   HGBA1C 6.3 (H) 05/05/2014   Lab Results  Component Value Date   LDLCALC 63 05/05/2014   CREATININE 1.03 (H) 08/31/2017        Scheduled Meds: . Chlorhexidine Gluconate Cloth  6 each Topical Q0600  . enoxaparin (LOVENOX) injection  40 mg Subcutaneous QHS  . ferrous sulfate  325 mg Oral BID WC  . insulin aspart  0-5 Units Subcutaneous QHS  . insulin aspart  0-9 Units Subcutaneous TID WC  . memantine  28 mg Oral Daily  . metoprolol tartrate  5 mg Intravenous Q8H  . mupirocin ointment  1 application Nasal BID  . potassium chloride  20 mEq Oral BID  . sodium chloride flush  3 mL Intravenous Q12H   Continuous Infusions: . sodium chloride    . piperacillin-tazobactam (ZOSYN)  IV Stopped (09/01/17 0604)  . vancomycin       LOS: 1 day    Time spent: >30 MINS    Reyne Dumas  [facility] Pager [phone]. If 7PM-7AM, please contact night-coverage at [email or web address], password [facility] 09/01/2017, 7:46 AM  LOS: 1 day

## 2017-09-01 NOTE — Consult Note (Signed)
Consultation Note Date: 09/01/2017   Patient Name: Judith Zuniga  DOB: May 17, 1920  MRN: [id number]  Age / Sex: 84 y.o., female  PCP: Wenda Low, MD Referring Physician: Reyne Dumas, MD  Reason for Consultation: Establishing goals of care  HPI/Patient Profile: 84 y.o. female  with past medical history of dementia, peripheral vascular disease status post right below the knee amputation with recent I&D of traumatic hematoma, systolic heart failure, chronic A. Fib and ischemic cardiomyopathy status post pacemaker placement, type 2 diabetes, skin breakdown, hypertension admitted on 08/31/2017 with SOB, hypoxia r/t HCAP with overlying pulmonary edema.   Clinical Assessment and Goals of Care: I met today at Ms. Salonga' bedside with daughter, [name]. I had a good conversation with [name] as SLP was finishing their consult and recommendations for dys 2 and nectar liquids. Explained further with [name]. [name] denies any trouble with swallowing in the past but does say that her mother will only eat for her (she tries to be at [facility] for all 3 meals) but that she eats all her food when she is there. [name] does report that SNF has completed numerous CXRs over the past few months with cough/SOB suspecting pneumonia but no pneumonia on CXR - question if this points to potential aspiration issues?? She has also been mostly on 2L oxygen with occasional breaks for days to 1 week for the past 6-12 months. Sleeps often during the day. Falls asleep in her wheelchair which has led to falls. Mostly pleasantly confused.   I spoke with [name] more about the HCAP and right stump skin breakdown awaiting surgical consult recommendations. [name] is clear that she would not want her mother to have any further surgery. She is not interested in aggressive measures. Clear about DNR and no feeding tube. She understands that her mother is getting closer to end of  life and is accepting of this. She believes that her mother would be horrified at her life at this point and would not want to live this way. With this statement I did speak with [name] about considering treating the treatable as we are while hospitalized but considering no further hospitalization and focus on comfort at SNF with assistance from hospice. [name] feels the weight of these decisions and is an only child. She shares that she knows this would be the right thing to do but does not want to make decisions "to stop." I offered reassurance and support and explained that we will continue to take one day at a time and will come up with a good plan for her mother moving forward based on how she does over the next couple days. [name] is very reasonable and saddened for her mother's poor QOL but struggling with making decisions to limit care but likely just needs more time to consider options. I will continue to follow and support Ms. Rosana Berger and [place].   Primary Decision Maker NEXT OF KIN daughter [name]    SUMMARY OF RECOMMENDATIONS   - NO surgery desired, no feeding tubes  -  Will continue to discuss desired aggressiveness of care (i.e. rehospitalization, hospice, etc) - More conservative treatments desired  Code Status/Advance Care Planning:  DNR   Symptom Management:   SLP following for aspiration risk and diet recommendations dys 2, nectar.   Cough: Antibiotics and treatment per primary. Lasix as needed. Guaifenesin prn. Consider nebs as needed. Consider role of robinul with decline and transition to comfort. Again SLP following.   Palliative Prophylaxis:   Aspiration, Bowel Regimen, Delirium Protocol, Frequent Pain Assessment, Oral Care and Turn Reposition  Additional Recommendations (Limitations, Scope, Preferences):  No Artificial Feeding and No Surgical Procedures  Psycho-social/Spiritual:   Desire for further Chaplaincy support:yes  Additional Recommendations: Education  on Hospice and Grief/Bereavement Support  Prognosis:   < 6 months is very likely given declining state and especially if focus of care will be on comfort and not aggressive care or interventions.   Discharge Planning: To Be Determined      Primary Diagnoses: Present on Admission: . Atherosclerosis of native arteries of the extremities with ulceration ([place]) . Atrial fibrillation ([place]) . Chronic atrial fibrillation ([place]) . PACEMAKER, CRT-St. Jude . Acute on chronic diastolic heart failure ([place]) . Anemia . Dementia . Skin breakdown . HCAP (healthcare-associated pneumonia)   I have reviewed the medical record, interviewed the patient and family, and examined the patient. The following aspects are pertinent.  Past Medical History:  Diagnosis Date  . Atrioventricular block, complete s/p AV ablation   . CAD (coronary artery disease)    s/p redo bypass surgery in 1998 with patent graft in Jan. 2009.   Marland Kitchen Dementia   . Diabetes mellitus   . Elevated liver function tests    on amiodarone  . Hyperlipidemia   . Hypertension   . Ischemic cardiomyopathy    ischemic heart myopathy , ejection fraction of 25%  . Pacemaker -CRT- [employer] Judes   . Permanent atrial fibrillation   . Premature ventricular contractions    frequent  . Skin breakdown   . Systolic heart failure    class II and euvolemic  . Type II or unspecified type diabetes mellitus without mention of complication, not stated as uncontrolled    Social History   Socioeconomic History  . Marital status: Widowed    Spouse name: Not on file  . Number of children: 1  . Years of education: Not on file  . Highest education level: Not on file  Occupational History  . Occupation: Retired  Scientific laboratory technician  . Financial resource strain: Not on file  . Food insecurity:    Worry: Not on file    Inability: Not on file  . Transportation needs:    Medical: Not on file    Non-medical: Not on file  Tobacco Use  . Smoking status: Never  Smoker  . Smokeless tobacco: Never Used  Substance and Sexual Activity  . Alcohol use: No  . Drug use: No  . Sexual activity: Not on file  Lifestyle  . Physical activity:    Days per week: Not on file    Minutes per session: Not on file  . Stress: Not on file  Relationships  . Social connections:    Talks on phone: Not on file    Gets together: Not on file    Attends religious service: Not on file    Active member of club or organization: Not on file    Attends meetings of clubs or organizations: Not on file    Relationship status: Not on  file  Other Topics Concern  . 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    Family History  Problem Relation Age of Onset  . Other Mother        pacemaker  . Heart disease Father   . Cancer Sister        gallbladder   Scheduled Meds: . apixaban  2.5 mg Oral BID  . aspirin EC  81 mg Oral Daily  . atorvastatin  40 mg Oral q1800  . bisoprolol  10 mg Oral Daily  . Chlorhexidine Gluconate Cloth  6 each Topical Q0600  . ferrous sulfate  325 mg Oral BID WC  . insulin aspart  0-5 Units Subcutaneous QHS  . insulin aspart  0-9 Units Subcutaneous TID WC  . memantine  28 mg Oral Daily  . mupirocin ointment  1 application Nasal BID  . potassium chloride  20 mEq Oral BID  . sodium chloride flush  3 mL Intravenous Q12H   Continuous Infusions: . sodium chloride    . famotidine (PEPCID) IV Stopped (09/01/17 1000)  . piperacillin-tazobactam (ZOSYN)  IV    . vancomycin Stopped (09/01/17 1047)   PRN Meds:.sodium chloride, acetaminophen, guaiFENesin, HYDROcodone-acetaminophen, magnesium hydroxide, ondansetron (ZOFRAN) IV, oxymetazoline, senna-docusate, sodium chloride flush No Known Allergies Review of Systems  Unable to perform ROS: Dementia    Physical Exam  Constitutional: She appears well-developed. She appears lethargic. She appears ill.  HENT:    Head: Normocephalic and atraumatic.  Cardiovascular: Normal rate.  Pulmonary/Chest: No accessory muscle usage. No tachypnea. No respiratory distress. She has rhonchi. She has rales.  Cough but unable to expectorate  Abdominal: Normal appearance.  Neurological: She appears lethargic. She is disoriented.  Pleasantly confused  Nursing note and vitals reviewed.   Vital Signs: BP (!) 117/59 (BP Location: Right Arm)   Pulse 69   Temp (!) 97.5 F (36.4 C) (Oral)   Resp 18   Ht _0  (1.6 m)   Wt 54.4 kg (120 lb)   SpO2 100%   BMI 21.26 kg/m  Pain Scale: 0-10   Pain Score: 0-No pain   SpO2: SpO2: 100 % O2 Device:SpO2: 100 % O2 Flow Rate: .O2 Flow Rate (L/min): 2 L/min  IO: Intake/output summary:   Intake/Output Summary (Last 24 hours) at 09/01/2017 1313 Last data filed at 09/01/2017 0604 Gross per 24 hour  Intake 100 ml  Output 450 ml  Net -350 ml    LBM:   Baseline Weight: Weight: 41.3 kg (91 lb) Most recent weight: Weight: 54.4 kg (120 lb)     Palliative Assessment/Data: 30%     Time Total: 80 min  Greater than 50%  of this time was spent counseling and coordinating care related to the above assessment and plan.  Signed by: Vinie Sill, NP Palliative Medicine Team Pager # [phone] (M-F 8a-5p) Team Phone # [phone] (Nights/Weekends)

## 2017-09-01 NOTE — NC FL2 (Signed)
[place] LEVEL OF CARE SCREENING TOOL     IDENTIFICATION  Patient Name: Judith Zuniga Birthdate: 1920-11-28 Sex: female Admission Date (Current Location): 08/31/2017  [facility] [place] and [place] Number:  Herbalist and Address:  [facility]. [facility], [place] [address] [place], [place] [address]      Provider Number: [id number]  Attending Physician Name and Address:  Reyne Dumas, MD  Relative Name and Phone Number:       Current Level of Care: Hospital Recommended Level of Care: [place] Prior Approval Number:    Date Approved/Denied:   PASRR Number: [id number]  Discharge Plan: SNF    Current Diagnoses: Patient Active Problem List   Diagnosis Date Noted  . CAP (community acquired pneumonia) 08/31/2017  . Anemia 08/31/2017  . HCAP (healthcare-associated pneumonia) 08/31/2017  . Dementia   . Skin breakdown   . Hematemesis 07/27/2016  . Stroke ([place])   . HLD (hyperlipidemia)   . Delirium 05/05/2014  . History of stroke 05/05/2014  . Ischemic cardiomyopathy 05/05/2014  . Chronic atrial fibrillation ([place]) 05/05/2014  . Diabetes mellitus ([place]) 05/05/2014  . Systolic heart failure 43/15/4008  . Chronic systolic heart failure ([place])   . DM (diabetes mellitus) type II uncontrolled, periph vascular disorder ([place])   . Orthostatic hypotension 07/28/2013  . Encounter for therapeutic drug monitoring 07/13/2013  . Multiple pulmonary nodules 12/13/2011  . Atherosclerosis of native arteries of the extremities with ulceration ([employer]) 07/09/2011  . Syncope 05/01/2011  . Acute on chronic diastolic heart failure ([place]) 10/08/2010  . Mitral regurgitation 10/08/2010  . Long term current use of anticoagulant 05/16/2010  . ATAXIA 11/14/2009  . CAROTID ARTERY DISEASE 10/17/2009  . AODM 04/09/2008  . Hyperlipidemia 04/09/2008  . Benign hypertensive heart disease with heart failure ([place]) 04/09/2008  . CAD, ARTERY BYPASS GRAFT 04/09/2008  .  Atrial fibrillation ([place]) 04/09/2008  . PACEMAKER, CRT-St. Jude 04/09/2008    Orientation RESPIRATION BLADDER Height & Weight     Self  O2(Nasal Canula 2 L) Incontinent, External catheter Weight: 120 lb (54.4 kg) Height:  5\' 3"  (160 cm)  BEHAVIORAL SYMPTOMS/MOOD NEUROLOGICAL BOWEL NUTRITION STATUS  (None) (Dementia) Continent Diet(Currently NPO. Will update at discharge.)  AMBULATORY STATUS COMMUNICATION OF NEEDS Skin     (Responds to pain.) Other (Comment)(Non-pressure wound on left lateral foot: Foam BID. Non-pressure wound on right leg: Foam BID.)                       Personal Care Assistance Level of Assistance              Functional Limitations Info  Sight, Hearing, Speech(Unknown. Responds to pain but does not follow commands.)          SPECIAL CARE FACTORS FREQUENCY  Speech therapy             Speech Therapy Frequency: 5 x week      Contractures Contractures Info: Not present    Additional Factors Info  Code Status, Allergies, Isolation Precautions Code Status Info: DNR Allergies Info: NKDA     Isolation Precautions Info: Contact: MRSA     Current Medications (09/01/2017):  This is the current hospital active medication list Current Facility-Administered Medications  Medication Dose Route Frequency Provider Last Rate Last Dose  . 0.9 %  sodium chloride infusion  250 mL Intravenous PRN Radene Gunning, NP      . acetaminophen (TYLENOL) tablet 650 mg  650 mg  Oral Q4H PRN Radene Gunning, NP      . apixaban (ELIQUIS) tablet 2.5 mg  2.5 mg Oral BID Reyne Dumas, MD      . aspirin EC tablet 81 mg  81 mg Oral Daily Abrol, Ascencion Dike, MD      . atorvastatin (LIPITOR) tablet 40 mg  40 mg Oral q1800 Reyne Dumas, MD      . bisoprolol (ZEBETA) tablet 10 mg  10 mg Oral Daily Abrol, Ascencion Dike, MD      . Chlorhexidine Gluconate Cloth 2 % PADS 6 each  6 each Topical Q0600 Lady Deutscher, MD   6 each at 09/01/17 [phone]  . famotidine (PEPCID) IVPB 20 mg premix  20  mg Intravenous Q12H Reyne Dumas, MD   Stopped at 09/01/17 1000  . ferrous sulfate tablet 325 mg  325 mg Oral BID WC Radene Gunning, NP   325 mg at 09/01/17 7893  . guaiFENesin (ROBITUSSIN) 100 MG/5ML solution 300 mg  15 mL Oral BID PRN Radene Gunning, NP      . HYDROcodone-acetaminophen (NORCO/VICODIN) 5-325 MG per tablet 1 tablet  1 tablet Oral Q6H PRN Black, Karen M, NP      . insulin aspart (novoLOG) injection 0-5 Units  0-5 Units Subcutaneous QHS Black, Karen M, NP      . insulin aspart (novoLOG) injection 0-9 Units  0-9 Units Subcutaneous TID WC Black, Lezlie Octave, NP      . magnesium hydroxide (MILK OF MAGNESIA) suspension 30 mL  30 mL Oral Daily PRN Radene Gunning, NP      . memantine (NAMENDA XR) 24 hr capsule 28 mg  28 mg Oral Daily Radene Gunning, NP   28 mg at 09/01/17 0929  . mupirocin ointment (BACTROBAN) 2 % 1 application  1 application Nasal BID Lady Deutscher, MD   1 application at 81/01/75 [phone]  . ondansetron (ZOFRAN) injection 4 mg  4 mg Intravenous Q6H PRN Black, Lezlie Octave, NP      . oxymetazoline (AFRIN) 0.05 % nasal spray 1 spray  1 spray Each Nare BID PRN Radene Gunning, NP      . piperacillin-tazobactam (ZOSYN) IVPB 2.25 g  2.25 g Intravenous Q8H Tyrone Apple, [facility]   Stopped at 09/01/17 8527  . piperacillin-tazobactam (ZOSYN) IVPB 3.375 g  3.375 g Intravenous Q8H Abrol, Nayana, MD      . potassium chloride 20 MEQ/15ML (10%) solution 20 mEq  20 mEq Oral BID Radene Gunning, NP   20 mEq at 09/01/17 0929  . senna-docusate (Senokot-S) tablet 1 tablet  1 tablet Oral Daily PRN Radene Gunning, NP      . sodium chloride flush (NS) 0.9 % injection 3 mL  3 mL Intravenous Q12H Radene Gunning, NP   3 mL at 09/01/17 0929  . sodium chloride flush (NS) 0.9 % injection 3 mL  3 mL Intravenous PRN Black, Lezlie Octave, NP      . vancomycin (VANCOCIN) 500 mg in sodium chloride 0.9 % 100 mL IVPB  500 mg Intravenous Q24H Tyrone Apple, [facility]   Stopped at 09/01/17 1047     Discharge Medications: Please  see discharge summary for a list of discharge medications.  Relevant Imaging Results:  Relevant Lab Results:   Additional Information SS#: [id number]  Candie Chroman, LCSW

## 2017-09-01 NOTE — Progress Notes (Signed)
  Echocardiogram 2D Echocardiogram has been performed.  Judith Zuniga M 09/01/2017, 2:29 PM

## 2017-09-01 NOTE — Progress Notes (Signed)
Pharmacy Antibiotic Note  Judith Zuniga is a 84 y.o. female admitted on 08/31/2017 with SOB.  Pharmacy was consulted for vancomycin and Zosyn dosing for HAP/aspiration PNA.  D#2 for HAP/asp PNA - afebrile, WBC WNL Scr 1.12.  Her weight is actually more than initially reported,   wt updated from 41 kg to 50 kg. Re-weighed this AM to verify actual weight which is 54.4 kg. Current vancomycin dose 500 mg IV q24h remains appropriate.  I will adjust Zosyn dose for CrCl 24.9 ml/min  Plan: Continue Vanc 500mg  IV Q24H Adjust Zosyn to 3.375gm IV Q8H (infuse each dose over 4 hours, okay for CrCl >20 ml/min)  Monitor renal fxn, clinical progress, abx LOT to decide on vanc trough   Height: 5\' 3"  (160 cm) Weight: 120 lb (54.4 kg) IBW/kg (Calculated) : 52.4  Temp (24hrs), Avg:98 F (36.7 C), Min:97.8 F (36.6 C), Max:98.2 F (36.8 C)  Recent Labs  Lab 08/31/17 0640 09/01/17 0750 09/01/17 0757  WBC 8.2 6.3  --   CREATININE 1.03*  --  1.12*    Estimated Creatinine Clearance: 24.9 mL/min (A) (by C-G formula based on SCr of 1.12 mg/dL (H)).    No Known Allergies  Vanc 5/19 >> Zosyn 5/19 >>  Dose Adjustments:   Zosyn 2.25 g IV q8h (30 min infusion) adjusted 5/20 to 3.375 g IV q8h (4h infusion)  5/19 BCx -sent  5/19 sputum cx - sent 5/19 MRSA PCR: positive     Thank you for allowing pharmacy to be part of this patients care team. Nicole Cella, [place] Clinical Pharmacist Pager: [phone] 08:00-15:29: [phone] 15:30-22:15: [phone] After 22:15: [place] 09/01/2017, 11:48 AM

## 2017-09-02 DIAGNOSIS — F0390 Unspecified dementia without behavioral disturbance: Secondary | ICD-10-CM

## 2017-09-02 DIAGNOSIS — D649 Anemia, unspecified: Secondary | ICD-10-CM

## 2017-09-02 DIAGNOSIS — E43 Unspecified severe protein-calorie malnutrition: Secondary | ICD-10-CM

## 2017-09-02 LAB — BASIC METABOLIC PANEL
ANION GAP: 11 (ref 5–15)
BUN: 22 mg/dL — ABNORMAL HIGH (ref 6–20)
CALCIUM: 8.8 mg/dL — AB (ref 8.9–10.3)
CHLORIDE: 105 mmol/L (ref 101–111)
CO2: 26 mmol/L (ref 22–32)
Creatinine, Ser: 1.17 mg/dL — ABNORMAL HIGH (ref 0.44–1.00)
GFR calc non Af Amer: 38 mL/min — ABNORMAL LOW (ref >60)
GFR, EST AFRICAN AMERICAN: 44 mL/min — AB (ref >60)
GLUCOSE: 97 mg/dL (ref 65–99)
Potassium: 3.8 mmol/L (ref 3.5–5.1)
Sodium: 142 mmol/L (ref 135–145)

## 2017-09-02 LAB — CBC
HCT: 31.3 % — ABNORMAL LOW (ref 36.0–46.0)
Hemoglobin: 10.2 g/dL — ABNORMAL LOW (ref 12.0–15.0)
MCH: 31.5 pg (ref 26.0–34.0)
MCHC: 32.6 g/dL (ref 30.0–36.0)
MCV: 96.6 fL (ref 78.0–100.0)
PLATELETS: 189 10*3/uL (ref 150–400)
RBC: 3.24 MIL/uL — ABNORMAL LOW (ref 3.87–5.11)
RDW: 14.7 % (ref 11.5–15.5)
WBC: 7.4 10*3/uL (ref 4.0–10.5)

## 2017-09-02 LAB — GLUCOSE, CAPILLARY
GLUCOSE-CAPILLARY: 110 mg/dL — AB (ref 65–99)
GLUCOSE-CAPILLARY: 176 mg/dL — AB (ref 65–99)
Glucose-Capillary: 79 mg/dL (ref 65–99)
Glucose-Capillary: 125 mg/dL — ABNORMAL HIGH (ref 65–99)

## 2017-09-02 LAB — STREP PNEUMONIAE URINARY ANTIGEN: STREP PNEUMO URINARY ANTIGEN: NEGATIVE

## 2017-09-02 MED ORDER — CEFDINIR 250 MG/5ML PO SUSR
300.0000 mg | Freq: Every day | ORAL | Status: DC
  Filled 2017-09-02: qty 6

## 2017-09-02 MED ORDER — DOXYCYCLINE CALCIUM 50 MG/5ML PO SYRP
100.0000 mg | ORAL_SOLUTION | Freq: Two times a day (BID) | ORAL | Status: DC
  Administered 2017-09-02 – 2017-09-03 (×3): 100 mg via ORAL
  Filled 2017-09-02 (×4): qty 10

## 2017-09-02 MED ORDER — CEFDINIR 125 MG/5ML PO SUSR
300.0000 mg | Freq: Every day | ORAL | Status: DC
  Administered 2017-09-02 – 2017-09-03 (×2): 300 mg via ORAL
  Filled 2017-09-02 (×2): qty 15

## 2017-09-02 MED ORDER — POLYETHYLENE GLYCOL 3350 17 G PO PACK
17.0000 g | PACK | Freq: Every day | ORAL | Status: DC
  Administered 2017-09-02 – 2017-09-03 (×2): 17 g via ORAL
  Filled 2017-09-02 (×2): qty 1

## 2017-09-02 MED ORDER — LEVALBUTEROL HCL 0.63 MG/3ML IN NEBU
0.3100 mg | INHALATION_SOLUTION | Freq: Three times a day (TID) | RESPIRATORY_TRACT | Status: DC
  Administered 2017-09-02 (×2): 0.315 mg via RESPIRATORY_TRACT
  Filled 2017-09-02 (×3): qty 3

## 2017-09-02 NOTE — Progress Notes (Signed)
[facility] PROGRESS NOTE  Judith Zuniga ZOX:[id number] DOB: Dec 12, 1920 DOA: 08/31/2017   PCP: Wenda Low, MD     Assessment/Plan: Principal Problem:   HCAP (healthcare-associated pneumonia) Active Problems:   Atrial fibrillation ([place])   PACEMAKER, CRT-St. Jude   Acute on chronic diastolic heart failure ([place])   Atherosclerosis of native arteries of the extremities with ulceration ([place])   Chronic atrial fibrillation ([facility])   Diabetes mellitus ([place])   Anemia   Dementia   Skin breakdown   Protein-calorie malnutrition, severe   84 y.o. female with medical history significant for dementia, peripheral vascular disease status post right below the knee amputation with recent I&D of traumatic hematoma, chronic A. Fib and ischemic cardiomyopathy status post pacemaker placement, type 2 diabetes, skin breakdown, hypertension, presents to the emergency department from her nursing facility with the chief complaint shortness of breath. Patient found to have  left lower lobe pneumonia, congestive heart failure. Patient ;s family met with palliative care and the goal is comfort care measures. Not ready for hospice yet. Anticipate she will be ready for discharge tomorrow  Assessment and plan Healthcare associated pneumonia Left basilar opacity, initially placed on vancomycin and Zosyn, will discontinue vancomycin/Zosyn and switched to  [facility] and doxycycline She is MRSA positive, blood culture no growth so far Continue scheduled nebulizers Oxygen requirements currently stable on 2 L  anticipate discharge tomorrow if able to tolerate oral antibiotics  Chronic atrial fibrillation -CHADS-VASC is 75 (age x2, CVA x2, gender, CHF, CAD, DM) -As above, patient is on Eliquis,   -Continue bisoprolol  Acute on chronic diastolic heart failure, history of cardiomyopathy status post pacemaker, BNP 900 on admission Given Lasix in the ED, 2-D echo EF 40-45% with increased right ventricular  systolic pressure consistent with severe pulmonary hypertension Blood pressures  soft therefore will diurese cautiously  hold Cozaar -Follow I/O's and daily wts -Continue lasix prn  Diabetes mellitus, type II -A1c was 6.3% in Jan '16  Hold glimeperide  , continue sliding scale insulin  Historyof CVA /dementia Dysphagia 2 diet, nectar thick liquids recommended by speech therapy -No new deficits identified  -ASA  , Lipitor Continue Eliquis  Concomitant PPI  Anemia,   Hemoglobin was 12.0 on 10/26/16,  7.4 on admission, repeated this morning, now 9.3>10.2 Could be contributing to the patient's shortness of breath,   Anemia panel shows anemia of chronic disease She has a previous history of GI bleeding last year, will start patient on PPI, and trend CBC  Constipation Patient has not had a bowel movement will start her on MiraLAX     DVT prophylaxsis Lovenox  Code Status:    DO NOT RESUSCITATE  Family Communication: Discussed in detail with the patient's daughter by the bedside, all imaging results, lab results explained to the patient   Disposition Plan:   Anticipate discharge tomorrow to SNF      Consultants:  Palliative care  Procedures:  None  Antibiotics: Anti-infectives (From admission, onward)   Start     Dose/Rate Route Frequency Ordered Stop   09/02/17 1300  cefdinir (OMNICEF) 250 MG/5ML suspension 300 mg     300 mg Oral Daily 09/02/17 1218     09/02/17 1300  doxycycline (VIBRAMYCIN) 50 MG/5ML syrup 100 mg     100 mg Oral 2 times daily 09/02/17 1218     09/01/17 1400  piperacillin-tazobactam (ZOSYN) IVPB 3.375 g  Status:  Discontinued     3.375 g 12.5 mL/hr over  240 Minutes Intravenous Every 8 hours 09/01/17 1148 09/02/17 1218   09/01/17 0900  vancomycin (VANCOCIN) 500 mg in sodium chloride 0.9 % 100 mL IVPB  Status:  Discontinued     500 mg 100 mL/hr over 60 Minutes Intravenous Every 24 hours 08/31/17 0845 09/02/17 1218   08/31/17 1400   piperacillin-tazobactam (ZOSYN) IVPB 2.25 g  Status:  Discontinued     2.25 g 100 mL/hr over 30 Minutes Intravenous Every 8 hours 08/31/17 0845 09/01/17 1200   08/31/17 0815  vancomycin (VANCOCIN) IVPB 1000 mg/200 mL premix     1,000 mg 200 mL/hr over 60 Minutes Intravenous  Once 08/31/17 0807 08/31/17 0942   08/31/17 0815  piperacillin-tazobactam (ZOSYN) IVPB 3.375 g     3.375 g 100 mL/hr over 30 Minutes Intravenous  Once 08/31/17 0807 08/31/17 0902         HPI/Subjective: More awake and alert today than she was yesterday  Objective: Vitals:   09/02/17 0454 09/02/17 0555 09/02/17 0820 09/02/17 1145  BP: (!) 127/56   (!) 100/59  Pulse: 62   62  Resp: 18     Temp: 97.8 F (36.6 C)   98.2 F (36.8 C)  TempSrc: Oral   Oral  SpO2: 100%  99% 97%  Weight:  52.6 kg (116 lb)    Height:        Intake/Output Summary (Last 24 hours) at 09/02/2017 1325 Last data filed at 09/02/2017 0325 Gross per 24 hour  Intake 330 ml  Output 550 ml  Net -220 ml    Exam:  Examination:  General exam: Appears calm and comfortable , cachectic Respiratory system: Clear to auscultation. Respiratory effort normal. Cardiovascular system: S1 & S2 heard, RRR. No JVD, murmurs, rubs, gallops or clicks. No pedal edema. Gastrointestinal system: Abdomen is nondistended, soft and nontender. No organomegaly or masses felt. Normal bowel sounds heard. Central nervous system: Somnolent but arousable Extremities: Symmetric 5 x 5 power. Skin: No rashes, lesions or ulcers      Data Reviewed: I have personally reviewed following labs and imaging studies  Micro Results Recent Results (from the past 240 hour(s))  Culture, blood (routine x 2) Call MD if unable to obtain prior to antibiotics being given     Status: None (Preliminary result)   Collection Time: 08/31/17  8:45 AM  Result Value Ref Range Status   Specimen Description BLOOD LEFT HAND  Final   Special Requests   Final    BOTTLES DRAWN AEROBIC AND  ANAEROBIC Blood Culture adequate volume   Culture   Final    NO GROWTH 1 DAY Performed at [facility], [address], [place], [place] [address]    Report Status PENDING  Incomplete  Culture, blood (routine x 2) Call MD if unable to obtain prior to antibiotics being given     Status: None (Preliminary result)   Collection Time: 08/31/17 10:15 AM  Result Value Ref Range Status   Specimen Description BLOOD RIGHT ANTECUBITAL  Final   Special Requests   Final    BOTTLES DRAWN AEROBIC ONLY Blood Culture adequate volume   Culture   Final    NO GROWTH 1 DAY Performed at [facility], [address] [place], [place] [address]    Report Status PENDING  Incomplete  MRSA PCR Screening     Status: Abnormal   Collection Time: 08/31/17  5:58 PM  Result Value Ref Range Status   MRSA by PCR POSITIVE (A) NEGATIVE Final  Comment:        The GeneXpert MRSA Assay (FDA approved for NASAL specimens only), is one component of a comprehensive MRSA colonization surveillance program. It is not intended to diagnose MRSA infection nor to guide or monitor treatment for MRSA infections. RESULT CALLED TO, READ BACK BY AND VERIFIED WITH: GRASUL,RN '@2017'  08/31/17 BY LHOWARD Performed at [facility], [place] [address] [place], [place] [address]     Radiology Reports Dg Chest 2 View  Result Date: 08/31/2017 CLINICAL DATA:  Possible pneumonia versus pulmonary edema. Dementia. EXAM: CHEST - 2 VIEW COMPARISON:  10/26/2016 FINDINGS: Sternotomy wires and left-sided pacemaker unchanged. Slight elevation of the left hemidiaphragm. Mild left basilar opacification slightly worse and may be due to atelectasis or infection. Mild prominence of the central pulmonary vasculature likely mild degree of vascular congestion. Stable mild cardiomegaly. Remainder of the exam is unchanged. IMPRESSION: Slight worsening left base opacification likely atelectasis or infection. Mild cardiomegaly with suggestion of  minimal vascular congestion. Electronically Signed   By: Marin Olp M.D.   On: 08/31/2017 07:32   Dg Tibia/fibula Right  Result Date: 08/09/2017 CLINICAL DATA:  Unwitnessed fall with swelling and bruising over the region of patient's right below-knee amputation. EXAM: RIGHT TIBIA AND FIBULA - 2 VIEW COMPARISON:  None. FINDINGS: There is diffuse decreased bone mineralization. There are moderate tricompartmental osteoarthritic changes present. Chondrocalcinosis present over the mediolateral compartments. No evidence of acute fracture or dislocation. No significant joint effusion. Moderate calcification of the arterial structures. A few surgical clips are present within the soft tissues. IMPRESSION: No acute findings. Moderate osteoarthritic change.  Atherosclerosis. Electronically Signed   By: Marin Olp M.D.   On: 08/09/2017 16:43   Ct Head Wo Contrast  Result Date: 08/09/2017 CLINICAL DATA:  Pain after fall EXAM: CT HEAD WITHOUT CONTRAST CT CERVICAL SPINE WITHOUT CONTRAST TECHNIQUE: Multidetector CT imaging of the head and cervical spine was performed following the standard protocol without intravenous contrast. Multiplanar CT image reconstructions of the cervical spine were also generated. COMPARISON:  June 14, 2016 FINDINGS: CT HEAD FINDINGS Brain: No subdural, epidural, or subarachnoid hemorrhage. The brainstem and basal cisterns are normal. Cerebellum is normal. Ventricles and sulci are prominent but stable. White matter changes are identified most prominent the frontal lobes bilaterally, stable on the left and increased on the right in the interval. The overlying frontal cortex appears is mildly involved on the right. While this is new since March of 2018, it is likely nonacute. No acute cortical ischemia is noted. No mass effect or midline shift. Vascular: Calcified atherosclerosis is seen in the intracranial carotids. Skull: Normal. Negative for fracture or focal lesion. Sinuses/Orbits: No acute  finding. Other: None. CT CERVICAL SPINE FINDINGS Alignment: No change in alignment. Mild anterolisthesis C3 versus C4 is stable. Mild kyphosis at C4 and C5 is stable. Anterolisthesis of C6 versus C7 is stable. Widening of the anterior C6-7 disc space is also stable. No acute fractures identified. Skull base and vertebrae: No acute fractures identified. Soft tissues and spinal canal: No prevertebral fluid or swelling. No visible canal hematoma. Disc levels:  Multilevel degenerative changes. Upper chest: Negative. Other: No other abnormalities are identified. IMPRESSION: 1. No acute intracranial abnormality. 2. No acute traumatic malalignment or fracture. Multilevel degenerative changes. Electronically Signed   By: Dorise Bullion III M.D   On: 08/09/2017 14:43   Ct Cervical Spine Wo Contrast  Result Date: 08/09/2017 CLINICAL DATA:  Pain after fall EXAM: CT HEAD WITHOUT CONTRAST CT CERVICAL  SPINE WITHOUT CONTRAST TECHNIQUE: Multidetector CT imaging of the head and cervical spine was performed following the standard protocol without intravenous contrast. Multiplanar CT image reconstructions of the cervical spine were also generated. COMPARISON:  June 14, 2016 FINDINGS: CT HEAD FINDINGS Brain: No subdural, epidural, or subarachnoid hemorrhage. The brainstem and basal cisterns are normal. Cerebellum is normal. Ventricles and sulci are prominent but stable. White matter changes are identified most prominent the frontal lobes bilaterally, stable on the left and increased on the right in the interval. The overlying frontal cortex appears is mildly involved on the right. While this is new since March of 2018, it is likely nonacute. No acute cortical ischemia is noted. No mass effect or midline shift. Vascular: Calcified atherosclerosis is seen in the intracranial carotids. Skull: Normal. Negative for fracture or focal lesion. Sinuses/Orbits: No acute finding. Other: None. CT CERVICAL SPINE FINDINGS Alignment: No change  in alignment. Mild anterolisthesis C3 versus C4 is stable. Mild kyphosis at C4 and C5 is stable. Anterolisthesis of C6 versus C7 is stable. Widening of the anterior C6-7 disc space is also stable. No acute fractures identified. Skull base and vertebrae: No acute fractures identified. Soft tissues and spinal canal: No prevertebral fluid or swelling. No visible canal hematoma. Disc levels:  Multilevel degenerative changes. Upper chest: Negative. Other: No other abnormalities are identified. IMPRESSION: 1. No acute intracranial abnormality. 2. No acute traumatic malalignment or fracture. Multilevel degenerative changes. Electronically Signed   By: Dorise Bullion III M.D   On: 08/09/2017 14:43     CBC Recent Labs  Lab 08/31/17 0640 09/01/17 0750 09/02/17 0012  WBC 8.2 6.3 7.4  HGB 7.4* 9.3* 10.2*  HCT 23.9* 30.0* 31.3*  PLT 191 173 189  MCV 99.6 99.0 96.6  MCH 30.8 30.7 31.5  MCHC 31.0 31.0 32.6  RDW 15.1 14.9 14.7  LYMPHSABS 2.1  --   --   MONOABS 0.6  --   --   EOSABS 0.2  --   --   BASOSABS 0.0  --   --     Chemistries  Recent Labs  Lab 08/31/17 0640 09/01/17 0757 09/02/17 0012  NA 143 144 142  K 3.7 3.7 3.8  CL 107 103 105  CO2 '26 30 26  ' GLUCOSE 95 94 97  BUN 31* 23* 22*  CREATININE 1.03* 1.12* 1.17*  CALCIUM 8.4* 8.8* 8.8*  AST  --  21  --   ALT  --  13*  --   ALKPHOS  --  83  --   BILITOT  --  0.9  --    ------------------------------------------------------------------------------------------------------------------ estimated creatinine clearance is 23.8 mL/min (A) (by C-G formula based on SCr of 1.17 mg/dL (H)). ------------------------------------------------------------------------------------------------------------------ Recent Labs    08/31/17 1015  HGBA1C 5.7*   ------------------------------------------------------------------------------------------------------------------ No results for input(s): CHOL, HDL, LDLCALC, TRIG, CHOLHDL, LDLDIRECT in the last  72 hours. ------------------------------------------------------------------------------------------------------------------ No results for input(s): TSH, T4TOTAL, T3FREE, THYROIDAB in the last 72 hours.  Invalid input(s): FREET3 ------------------------------------------------------------------------------------------------------------------ Recent Labs    08/31/17 1015  VITAMINB12 328  FOLATE 20.4  FERRITIN 182  TIBC 214*  IRON 29  RETICCTPCT 1.0    Coagulation profile No results for input(s): INR, PROTIME in the last 168 hours.  No results for input(s): DDIMER in the last 72 hours.  Cardiac Enzymes No results for input(s): CKMB, TROPONINI, MYOGLOBIN in the last 168 hours.  Invalid input(s): CK ------------------------------------------------------------------------------------------------------------------ Invalid input(s): POCBNP   CBG: Recent Labs  Lab 09/01/17 1137 09/01/17 1644 09/01/17 2128  09/02/17 0734 09/02/17 1141  GLUCAP 91 81 86 79 110*       Studies: No results found.    Lab Results  Component Value Date   HGBA1C 5.7 (H) 08/31/2017   HGBA1C 6.1 (H) 07/27/2016   HGBA1C 6.3 (H) 05/05/2014   Lab Results  Component Value Date   LDLCALC 63 05/05/2014   CREATININE 1.17 (H) 09/02/2017       Scheduled Meds: . apixaban  2.5 mg Oral BID  . aspirin EC  81 mg Oral Daily  . atorvastatin  40 mg Oral q1800  . bisoprolol  10 mg Oral Daily  . cefdinir  300 mg Oral Daily  . Chlorhexidine Gluconate Cloth  6 each Topical Q0600  . doxycycline  100 mg Oral BID  . ferrous sulfate  325 mg Oral BID WC  . insulin aspart  0-5 Units Subcutaneous QHS  . insulin aspart  0-9 Units Subcutaneous TID WC  . levalbuterol  0.315 mg Nebulization TID  . memantine  28 mg Oral Daily  . mupirocin ointment  1 application Nasal BID  . pantoprazole  40 mg Oral Daily  . potassium chloride  20 mEq Oral BID  . sodium chloride flush  3 mL Intravenous Q12H   Continuous  Infusions: . sodium chloride    . famotidine (PEPCID) IV Stopped (09/02/17 0930)     LOS: 2 days    Time spent: >30 MINS    Reyne Dumas  [facility] Pager [phone]. If 7PM-7AM, please contact night-coverage at [email or web address], password [facility] 09/02/2017, 1:25 PM  LOS: 2 days

## 2017-09-02 NOTE — Progress Notes (Signed)
Palliative:  Ms. Depaolis continues to be pleasantly confused. She is resting when I come to visit but awakens. I ask how she is feeling today and she held up her hand to give me the "ok" sign and smiles. Offered emotional support.   I called and spoke with her daughter, [name]. [name] understands her mother's poor prognosis and I recommended palliative to continues conversations with her upon discharge (planned for tomorrow). We discussed Ms. Hagemeister' poor appetite and limited intake and trouble swallowing (she does worry about her mother being able to take po antibiotics for HCAP). [name] is aware that end of life may be coming sooner than later with HCAP and underlying progressing dementia. May be interested in obtaining hospice services soon but will need further discussions and support.   Please recommend outpatient palliative services at SNF in d/c summary.   15 min  Vinie Sill, NP Palliative Medicine Team Pager # [phone] (M-F 8a-5p) Team Phone # [phone] (Nights/Weekends)

## 2017-09-02 NOTE — Progress Notes (Signed)
Initial Nutrition Assessment  DOCUMENTATION CODES:   Severe malnutrition in context of chronic illness  INTERVENTION:   - Magic cup TID with meals, each supplement provides 290 kcal and 9 grams of protein  - RD ordered feeding assistance with all meals  NUTRITION DIAGNOSIS:   Severe Malnutrition related to chronic illness(dementia, CHF) as evidenced by moderate fat depletion, severe fat depletion, moderate muscle depletion, severe muscle depletion.  GOAL:   Patient will meet greater than or equal to 90% of their needs  MONITOR:   PO intake, Supplement acceptance, Labs, Skin, Weight trends  REASON FOR ASSESSMENT:   Low Braden    ASSESSMENT:   84 year old female who presented to ED from SNF with difficulty breathing. PMH significant for dementia, peripheral vascular disease, s/p R BKA with recent I&D of traumatic hematoma, chronic atrial fibrillation s/p pacemaker placement, and type 2 diabetes mellitus. Pt admitted for HCAP and was found to have CHF.  5/20 - SLP recommended dysphagia 2 diet, nectar-thick liquids  Palliative is following this pt.  Spoke with pt and daughter at bedside. Pt's daughter reports that pt "is not eating much." Pt's daughter tries to be with pt during mealtimes at SNF because pt needs assistance and encouragement with eating. Pt's daughter does attend the lunch meal daily. Pt's daughter has noticed that pt has recently started eating more slowly.  Per pt's daughter, pt's most recent weight was 91 lbs. Weight upon admission of 120 lbs appears to be stated rather than measured. Pt's daughter reports that prior to losing any weight, pt weighed 150 lbs but that this was "many years ago." Pt's daughter states that pt's health and weight declined after amputation 3 years ago. Pt's dementia also worsened at this time.  Per pt's daughter, pt receives at least 2 oral nutrition supplements daily at [facility] but is unsure what kind. Pt's daughter states pt enjoys ice  cream. RD to order Magic Cup TID.  Meal Completion: 10-30%  Medications reviewed and include: 325 mg ferrous sulfate BID, sliding scale Novolog, 40 mg Protonix daily, 20 mEq KCL BID  Labs reviewed: BUN 22 (H), creatinine 1.17 (H), hemoglobin 10.2 (L), HCT 31.3 (L) CBG's: 79, 86, 81 x 24 hours  NUTRITION - FOCUSED PHYSICAL EXAM:    Most Recent Value  Orbital Region  Moderate depletion  Upper Arm Region  Severe depletion  Thoracic and Lumbar Region  Severe depletion  Buccal Region  Moderate depletion  Temple Region  Severe depletion  Clavicle Bone Region  Severe depletion  Clavicle and Acromion Bone Region  Severe depletion  Scapular Bone Region  Unable to assess  Dorsal Hand  Severe depletion  Patellar Region  Severe depletion  Anterior Thigh Region  Severe depletion  Posterior Calf Region  Severe depletion  Edema (RD Assessment)  None  Hair  Reviewed  Eyes  Reviewed  Mouth  Reviewed  Skin  Reviewed  Nails  Reviewed       Diet Order:   Diet Order           DIET DYS 2 Room service appropriate? Yes; Fluid consistency: Nectar Thick  Diet effective now          EDUCATION NEEDS:   No education needs have been identified at this time  Skin:  Skin Assessment: Skin Integrity Issues: Skin Integrity Issues:: Other (Comment) Other: non-pressure wound to L foot and R leg  Last BM:  unknown/PTA  Height:   Ht Readings from Last 1 Encounters:  08/31/17 5\' 3"  (  1.6 m)    Weight:   Wt Readings from Last 1 Encounters:  09/02/17 116 lb (52.6 kg)    Ideal Body Weight:  48.9 kg  BMI:  22.03 kg/m^2  Estimated Nutritional Needs:   Kcal:  1300-1500 kcal/day  Protein:  60-75 grams/day  Fluid:  1.3-1.5 L/day    Gaynell Face, MS, RD, LDN Pager: [phone] Weekend/After Hours: [phone]

## 2017-09-02 NOTE — Progress Notes (Signed)
  Speech Language Pathology Treatment: Dysphagia  Patient Details Name: Judith Zuniga MRN: [id number] DOB: [birth year] Today's Date: 09/02/2017 Time: 6387-5643 SLP Time Calculation (min) (ACUTE ONLY): 23 min  Assessment / Plan / Recommendation Clinical Impression  Pt was seen for skilled ST targeting dysphagia goals.  Pt was asleep upon arrival but awakened easily to voice and light touch.  Pt was repositioned to maximize safe PO intake.  SLP facilitated the session with therapeutic trials of thin liquids via teaspoon following oral care to continue working towards least restrictive and most pleasurable diet for pt (Pt asking for water upon therapist's arrival and daughter reports that pt was on a regular diet and thin liquids prior to admission).  Pt had congested cough prior to administration of advanced liquids which appeared to increase in frequency and intensity post consumption of thins.  No overt s/s of aspiration were evident with nectar thick liquids.  Recommend that pt remain on current diet for now with plans to work towards safe implementation of the water protocol in between meals for pt comfort.  Pt left in bed with daughter at bedside.  All questions answered to daughter's satisfaction at this time as pt appears to have limited understanding of education due to dementia.    HPI HPI: 84 year old female with significant dementia, peripheral vascular disease status post right below the knee amputation, chronic atrial fibrillation and ischemic cardiomyopathy status post pacemaker placement, type 2 diabetes mellitus, hypertension presented to the emergency department with fever, and chest x-ray consistent with pneumonia versus mild vascular congestion. SLP evaluated 05/06/14 with findings of normal oropharyngeal swallow function; regular diet and thin liquids was recommended.      SLP Plan  Continue with current plan of care       Recommendations  Diet recommendations: Dysphagia 2 (fine  chop);Nectar-thick liquid Liquids provided via: Cup Medication Administration: Crushed with puree Supervision: Trained caregiver to feed patient Compensations: Minimize environmental distractions;Slow rate;Small sips/bites Postural Changes and/or Swallow Maneuvers: Seated upright 90 degrees                Oral Care Recommendations: Oral care QID Follow up Recommendations: Skilled Nursing facility SLP Visit Diagnosis: Dysphagia, oropharyngeal phase (R13.12) Plan: Continue with current plan of care       GO                PageSelinda Orion 09/02/2017, 12:26 PM

## 2017-09-02 NOTE — Clinical Social Work Note (Signed)
CSW notified SNF admissions coordinator that, per MD note, patient will likely return tomorrow. Patient's daughter notified and will be at the facility around 10:00 tomorrow to complete readmission paperwork.  Judith Zuniga, [place]

## 2017-09-02 NOTE — Progress Notes (Signed)
PHARMACY NOTE:  ANTIMICROBIAL RENAL DOSAGE ADJUSTMENT  Current antimicrobial regimen includes a mismatch between antimicrobial dosage and estimated renal function.  As per policy approved by the Pharmacy & Therapeutics and Medical Executive Committees, the antimicrobial dosage will be adjusted accordingly.  Current antimicrobial dosage:  Cefdinir 300 mg BID  Indication:  pneumonia  Renal Function:  Estimated Creatinine Clearance: 23.8 mL/min (A) (by C-G formula based on SCr of 1.17 mg/dL (H)). []      On intermittent HD, scheduled: []      On CRRT    Antimicrobial dosage has been changed to:  Cefdinir 300 mg daily.  Additional comments:  Cefdinir and Doxycycline administration times separated from ferrous sulfate BID with meals to try to prevent decreased absorption and potential decreased effectiveness.  Thank you for allowing pharmacy to be a part of this patient's care.  Arty Baumgartner, [facility]  Pager: [phone] 09/02/2017 1:13 PM

## 2017-09-03 LAB — BASIC METABOLIC PANEL
Anion gap: 9 (ref 5–15)
BUN: 21 mg/dL — ABNORMAL HIGH (ref 6–20)
CHLORIDE: 105 mmol/L (ref 101–111)
CO2: 31 mmol/L (ref 22–32)
Calcium: 9 mg/dL (ref 8.9–10.3)
Creatinine, Ser: 1.16 mg/dL — ABNORMAL HIGH (ref 0.44–1.00)
GFR calc Af Amer: 45 mL/min — ABNORMAL LOW (ref >60)
GFR calc non Af Amer: 39 mL/min — ABNORMAL LOW (ref >60)
GLUCOSE: 130 mg/dL — AB (ref 65–99)
POTASSIUM: 4.1 mmol/L (ref 3.5–5.1)
Sodium: 145 mmol/L (ref 135–145)

## 2017-09-03 LAB — GLUCOSE, CAPILLARY
Glucose-Capillary: 100 mg/dL — ABNORMAL HIGH (ref 65–99)
Glucose-Capillary: 144 mg/dL — ABNORMAL HIGH (ref 65–99)

## 2017-09-03 MED ORDER — POTASSIUM CHLORIDE 20 MEQ PO PACK: 20.0000 meq | PACK | Freq: Every day | ORAL | 0 refills | Status: AC

## 2017-09-03 MED ORDER — LOSARTAN POTASSIUM 25 MG PO TABS: 25.0000 mg | ORAL_TABLET | Freq: Every day | ORAL | 1 refills | Status: AC

## 2017-09-03 MED ORDER — BISOPROLOL FUMARATE 5 MG PO TABS: 5.0000 mg | ORAL_TABLET | Freq: Every day | ORAL | 1 refills | Status: AC

## 2017-09-03 MED ORDER — MIRTAZAPINE 15 MG PO TBDP
7.5000 mg | ORAL_TABLET | Freq: Every day | ORAL | 2 refills | Status: AC
Start: 2017-09-03 — End: 2018-09-03

## 2017-09-03 MED ORDER — CEFDINIR 250 MG/5ML PO SUSR: 300.0000 mg | Freq: Two times a day (BID) | ORAL | 0 refills | Status: DC

## 2017-09-03 MED ORDER — LEVALBUTEROL HCL 0.63 MG/3ML IN NEBU: 0.3100 mg | INHALATION_SOLUTION | Freq: Four times a day (QID) | RESPIRATORY_TRACT | Status: DC | PRN

## 2017-09-03 MED ORDER — HYDROCODONE-ACETAMINOPHEN 5-325 MG PO TABS: 1.0000 | ORAL_TABLET | Freq: Four times a day (QID) | ORAL | 0 refills | Status: AC | PRN

## 2017-09-03 MED ORDER — DOXYCYCLINE CALCIUM 50 MG/5ML PO SYRP: 100.0000 mg | ORAL_SOLUTION | Freq: Two times a day (BID) | ORAL | 0 refills | Status: AC

## 2017-09-03 MED ORDER — CEFDINIR 250 MG/5ML PO SUSR: 300.0000 mg | Freq: Every day | ORAL | 0 refills | Status: AC

## 2017-09-03 MED ORDER — FUROSEMIDE 20 MG PO TABS: 20.0000 mg | ORAL_TABLET | Freq: Every day | ORAL | 0 refills | Status: AC

## 2017-09-03 NOTE — Clinical Social Work Note (Signed)
Patient's daughter has completed SNF readmission paperwork. She can return today if stable.  Dayton Scrape, [place]

## 2017-09-03 NOTE — Plan of Care (Signed)
  Problem: Education: Goal: Knowledge of General Education information will improve Outcome: Progressing   Problem: Health Behavior/Discharge Planning: Goal: Ability to manage health-related needs will improve Outcome: Progressing   Problem: Clinical Measurements: Goal: Ability to maintain clinical measurements within normal limits will improve Outcome: Progressing Goal: Will remain free from infection Outcome: Progressing Goal: Diagnostic test results will improve Outcome: Progressing Goal: Respiratory complications will improve Outcome: Progressing Goal: Cardiovascular complication will be avoided Outcome: Progressing   Problem: Nutrition: Goal: Adequate nutrition will be maintained Outcome: Progressing   Problem: Coping: Goal: Level of anxiety will decrease Outcome: Progressing   Problem: Elimination: Goal: Will not experience complications related to bowel motility Outcome: Progressing Goal: Will not experience complications related to urinary retention Outcome: Progressing   Problem: Pain Managment: Goal: General experience of comfort will improve Outcome: Progressing   Problem: Safety: Goal: Ability to remain free from injury will improve Outcome: Progressing   Problem: Skin Integrity: Goal: Risk for impaired skin integrity will decrease Outcome: Progressing   

## 2017-09-03 NOTE — Discharge Instructions (Signed)
1)Wound care instructions as outlined--- A)Apply betadine to the left lateral foot wound and the left first metatarsal head wound.  Allow to air dry. Protect areas from further trauma. B)Apply saline moistened gauze to the right stump wound.  Cover with dry gauze and ABD pad. Secure with kerlex.  2) take medications as prescribed including Remeron 7.5 mg at bedtime for sleep and appetite stimulation  3) outpatient palliative care consult strongly advised at skilled [place] to liberalize diet due to poor appetite and poor oral intake overall, its okay for patient to eat whatever she wants and what ever she can tolerated without restrictions--swallowing/aspiration precautions as advised  Okay to liberalize diet due to poor appetite and poor oral intake overall, its okay for patient to eat whatever she wants and what ever she can tolerated without restrictions--swallowing/aspiration precautions as advised

## 2017-09-03 NOTE — Progress Notes (Signed)
Called report to [facility], spoke to Janett Billow [phone] [facility] called by social work  Awaiting transportation

## 2017-09-03 NOTE — Clinical Social Work Note (Signed)
CSW facilitated patient discharge including contacting patient family and facility to confirm patient discharge plans. Clinical information faxed to facility and family agreeable with plan. CSW arranged ambulance transport via [facility] to [facility]. RN to call report prior to discharge [phone] Room 403).  CSW will sign off for now as social work intervention is no longer needed. Please consult [place] again if new needs arise.  Dayton Scrape, [place]

## 2017-09-03 NOTE — Plan of Care (Signed)
  Problem: Education: Goal: Knowledge of General Education information will improve 09/03/2017 1226 by Jibreel Fedewa, Vertell Limber, RN Outcome: Adequate for Discharge 09/03/2017 1225 by Evalee Jefferson, RN Outcome: Progressing   Problem: Health Behavior/Discharge Planning: Goal: Ability to manage health-related needs will improve 09/03/2017 1226 by Misti Towle, Vertell Limber, RN Outcome: Adequate for Discharge 09/03/2017 1225 by Evalee Jefferson, RN Outcome: Progressing   Problem: Clinical Measurements: Goal: Ability to maintain clinical measurements within normal limits will improve 09/03/2017 1226 by Evalee Jefferson, RN Outcome: Adequate for Discharge 09/03/2017 1225 by Evalee Jefferson, RN Outcome: Progressing Goal: Will remain free from infection 09/03/2017 1226 by Evalee Jefferson, RN Outcome: Adequate for Discharge 09/03/2017 1225 by Evalee Jefferson, RN Outcome: Progressing Goal: Diagnostic test results will improve 09/03/2017 1226 by Evalee Jefferson, RN Outcome: Adequate for Discharge 09/03/2017 1225 by Evalee Jefferson, RN Outcome: Progressing Goal: Respiratory complications will improve 09/03/2017 1226 by Evalee Jefferson, RN Outcome: Adequate for Discharge 09/03/2017 1225 by Evalee Jefferson, RN Outcome: Progressing Goal: Cardiovascular complication will be avoided 09/03/2017 1226 by Evalee Jefferson, RN Outcome: Adequate for Discharge 09/03/2017 1225 by Evalee Jefferson, RN Outcome: Progressing   Problem: Nutrition: Goal: Adequate nutrition will be maintained 09/03/2017 1226 by Evalee Jefferson, RN Outcome: Adequate for Discharge 09/03/2017 1225 by Evalee Jefferson, RN Outcome: Progressing   Problem: Coping: Goal: Level of anxiety will decrease 09/03/2017 1226 by Evalee Jefferson, RN Outcome: Adequate for Discharge 09/03/2017 1225 by Evalee Jefferson, RN Outcome: Progressing    Problem: Elimination: Goal: Will not experience complications related to bowel motility 09/03/2017 1226 by Evalee Jefferson, RN Outcome: Adequate for Discharge 09/03/2017 1225 by Evalee Jefferson, RN Outcome: Progressing Goal: Will not experience complications related to urinary retention 09/03/2017 1226 by Evalee Jefferson, RN Outcome: Adequate for Discharge 09/03/2017 1225 by Evalee Jefferson, RN Outcome: Progressing   Problem: Pain Managment: Goal: General experience of comfort will improve 09/03/2017 1226 by Evalee Jefferson, RN Outcome: Adequate for Discharge 09/03/2017 1225 by Evalee Jefferson, RN Outcome: Progressing   Problem: Safety: Goal: Ability to remain free from injury will improve 09/03/2017 1226 by Evalee Jefferson, RN Outcome: Adequate for Discharge 09/03/2017 1225 by Evalee Jefferson, RN Outcome: Progressing   Problem: Skin Integrity: Goal: Risk for impaired skin integrity will decrease 09/03/2017 1226 by Evalee Jefferson, RN Outcome: Adequate for Discharge 09/03/2017 1225 by Evalee Jefferson, RN Outcome: Progressing

## 2017-09-03 NOTE — Progress Notes (Signed)
Pt discharged with [facility]. Report and all paperwork handed to transport. Pt has all belongs. Pt IV and telemetry removed by primary RN

## 2017-09-03 NOTE — Discharge Summary (Signed)
Judith Zuniga, is a 84 y.o. female  DOB [birth year]  MRN [id number].  Admission date:  08/31/2017  Admitting Physician  Lady Deutscher, MD  Discharge Date:  09/03/2017   Primary MD  Wenda Low, MD  Recommendations for primary care physician for things to follow:   1)Wound care instructions as outlined--- A)Apply betadine to the left lateral foot wound and the left first metatarsal head wound.  Allow to air dry. Protect areas from further trauma. B)Apply saline moistened gauze to the right stump wound.  Cover with dry gauze and ABD pad. Secure with kerlex.  2) take medications as prescribed including Remeron 7.5 mg at bedtime for sleep and appetite stimulation  3) outpatient palliative care consult strongly advised at skilled [place] to liberalize diet due to poor appetite and poor oral intake overall, its okay for patient to eat whatever she wants and what ever she can tolerated without restrictions--swallowing/aspiration precautions as advised  Admission Diagnosis  Acute pulmonary edema ([place]) [J81.0] Hypoxia [R09.02] Skin breakdown [L90.9] HAP (hospital-acquired pneumonia) [J18.9] Anemia, unspecified type [D64.9]   Discharge Diagnosis  Acute pulmonary edema (HCC) [J81.0] Hypoxia [R09.02] Skin breakdown [L90.9] HAP (hospital-acquired pneumonia) [J18.9] Anemia, unspecified type [D64.9]    Principal Problem:   HCAP (healthcare-associated pneumonia) Active Problems:   Atrial fibrillation (HCC)   PACEMAKER, CRT-St. Jude   Acute on chronic diastolic heart failure (HCC)   Atherosclerosis of native arteries of the extremities with ulceration (HCC)   Chronic atrial fibrillation (HCC)   Diabetes mellitus ([place])   Anemia   Dementia   Skin breakdown   Protein-calorie malnutrition, severe      Past Medical History:  Diagnosis Date  . Atrioventricular block, complete s/p  AV ablation   . CAD (coronary artery disease)    s/p redo bypass surgery in 1998 with patent graft in Jan. 2009.   Marland Kitchen Dementia   . Diabetes mellitus   . Elevated liver function tests    on amiodarone  . Hyperlipidemia   . Hypertension   . Ischemic cardiomyopathy    ischemic heart myopathy , ejection fraction of 25%  . Pacemaker -CRT- [employer] Judes   . Permanent atrial fibrillation   . Premature ventricular contractions    frequent  . Skin breakdown   . Systolic heart failure    class II and euvolemic  . Type II or unspecified type diabetes mellitus without mention of complication, not stated as uncontrolled     Past Surgical History:  Procedure Laterality Date  . AMPUTATION Right 05/11/2014   Procedure: AMPUTATION BELOW KNEE;  Surgeon: Newt Minion, MD;  Location: [facility];  Service: Orthopedics;  Laterality: Right;  . cardiac caths     multile  . CORONARY ARTERY BYPASS GRAFT  1998   CABG x5: RIMA to diagonal one, RIMA to diagonal 2, SVG to PD, SVG to PL, SVG to circ  . CORONARY ARTERY BYPASS GRAFT  1985   x3: LIMA to LAD, SVG to PD and SVG to  PL  . pacer generator change out     St. Jude pacemaker       HPI  from the history and physical done on the day of admission:  Patient coming from: [facility]  Chief Complaint: sob  HPI: Judith Zuniga is a demented 84 y.o. female with medical history significant for dementia, peripheral vascular disease status post right below the knee amputation with recent I&D of traumatic hematoma, chronic A. Fib and ischemic cardiomyopathy status post pacemaker placement, type 2 diabetes, skin breakdown, hypertension, presents to the emergency department from her nursing facility with the chief complaint shortness of breath. Initial evaluation includes chest x-ray concerning for infiltrate and/or acute on chronic heart failure. [facility] are asked to admit  Information is obtained from the chart and the patient noting that information from  the patient is quite unreliable due to advanced dementia. Per chart review EMS was called to facility for worsening cough and shortness of breath. Reportedly patient had hypoxia upon their arrival. Reportedly her oxygen saturation level was in the 80s. Family reports she has been on oxygen off and on for the last several days at the facility. No previous oxygen dependence. EMS provided DuoNeb labs which improved oxygen saturation level. Upon arrival oxygen saturation level low 90s. No report of any fever nausea vomiting. There is report of increased cough wet sounding but nonproductive due to poor cough effort. Reports of diarrhea. No complaints of dysuria hematuria frequency or urgency.  ED Course: in the emergency department she's afebrile hemodynamically stable not hypoxic. She is provided with IV antibiotics as well as 20 mg of Lasix. The time of admission she is nontoxic appearing    Hospital Course:   Brief summary:- 84 y.o.femalewith medical history significantfor dementia, peripheral vascular disease status post right below the knee amputation with recent I&D of traumatic hematoma, chronic A. Fib and ischemic cardiomyopathy status post pacemaker placement, type 2 diabetes, skin breakdown, hypertension, presents to the emergency department from her nursing facility with the chief complaint shortness of breath. Patient found to have  left lower lobe pneumonia, congestive heart failure. Patient's family met with palliative care and the goal is comfort care measures, but okay to treat active infection and other reversible illnesses. Not ready for hospice yet.  Discharge back to skilled nursing facility with palliative care services following  Assessment and plan  1)- Healthcare associated pneumonia -clinically much improved, hypoxia is resolved, no fevers, no leukocytosis, radiologically and clinically patient had left lower lobe pneumonia-which clinically appears much improved,  initially placed  on vancomycin and Zosyn, discharged on Omnicef and doxycycline for additional 6 days, She is MRSA positive, blood culture no growth so far.  Continue bronchodilators ` 2)Chronic Atrial Fibrillation- -CHADS-VASC is 57 (age x2, CVA x2, gender, CHF, CAD, DM)-stable, given soft blood pressures with decrease bisoprolol to 5 mg for rate control and Eliquis for anticoagulation.  Left subclavian pacemaker in situ  3)HFrEF/Severe pulmonary hypertension -admitted with acute on chronic combined systolic and diastolic heart failure, history of cardiomyopathy status post pacemaker, BNP 900 on admission, last known EF 40 to 45%, echo showed  increased right ventricular systolic pressure consistent with severe pulmonary hypertension, did well with IV diuresis, may de-escalate Lasix, losartan has been decreased to 25 mg daily due to soft BP, bisoprolol also decreased to 5 mg daily  4)Diabetes mellitus, type II- -A1c is 5.7 , may discontinue glimeperide given poor oral intake to avoid life-threatening hypoglycemia , okay to liberalize patient's diet  5)Historyof CVA /dementia- Dysphagia 2 diet, nectar thick liquids recommended by speech therapy, continue Eliquis for secondary stroke prophylaxis,, continue Lipitor, -No new acute neuro deficits identified .  At baseline patient has very severe cognitive deficits, she is oriented x0.  Continue Namenda  6)Anemia- Hemoglobin was 12.0 on 10/26/16,  7.4 on admission, repeated , now 9.3>10.2. Anemia panel shows anemia of chronic disease, She has a previous history of GI bleeding last year, treat empirically with PPI.  No evidence of active bleeding  7)Constipation-discharge ON  MiraLAX  8) social/ethics- . Patient's family met with palliative care and the goal is comfort care measures, but okay to treat active infection and other reversible illnesses. Not ready for hospice yet.  Discharge back to skilled nursing facility with palliative care services following.  Patient is  a DNR/DNI  9) moderate to severe protein caloric malnutrition/anorexia----treat empirically with Remeron 7.5 mg nightly  10)Wound Care- Wound care instructions as outlined--- A)Apply betadine to the left lateral foot wound and the left first metatarsal head wound.  Allow to air dry. Protect areas from further trauma. B)Apply saline moistened gauze to the right stump wound.  Cover with dry gauze and ABD pad. Secure with kerlex.     Discharge Condition: STABLE  Follow UP  Contact information for after-discharge care    Destination    [facility] .   Service:  Skilled Nursing Contact information: [place] [phone]               Consults obtained - palliative/speech  Diet and Activity recommendation:  As advised  Discharge Instructions     Discharge Instructions    Call MD for:  difficulty breathing, headache or visual disturbances   Complete by:  As directed    Call MD for:  persistant dizziness or light-headedness   Complete by:  As directed    Call MD for:  persistant nausea and vomiting   Complete by:  As directed    Call MD for:  redness, tenderness, or signs of infection (pain, swelling, redness, odor or green/yellow discharge around incision site)   Complete by:  As directed    Call MD for:  severe uncontrolled pain   Complete by:  As directed    Call MD for:  temperature >100.4   Complete by:  As directed    Diet general   Complete by:  As directed    Okay to liberalize diet due to poor appetite and poor oral intake overall, its okay for patient to eat whatever she wants and what ever she can tolerated without restrictions--swallowing/aspiration precautions as advised   Discharge instructions   Complete by:  As directed    1)Wound care instructions as outlined--- A)Apply betadine to the left lateral foot wound and the left first metatarsal head wound.  Allow to air dry. Protect areas  from further trauma. B)Apply saline moistened gauze to the right stump wound.  Cover with dry gauze and ABD pad. Secure with kerlex.  2) take medications as prescribed including Remeron 7.5 mg at bedtime for sleep and appetite stimulation  3) outpatient palliative care consult strongly advised at skilled [facility] to liberalize diet due to poor appetite and poor oral intake overall, its okay for patient to eat whatever she wants and what ever she can tolerated without restrictions--swallowing/aspiration precautions as advised   Increase activity slowly   Complete by:  As directed  Discharge Medications     Allergies as of 09/03/2017   No Known Allergies     Medication List    STOP taking these medications   aspirin 81 MG EC tablet   glimepiride 1 MG tablet Commonly known as:  AMARYL   oxyCODONE-acetaminophen 5-325 MG tablet Commonly known as:  PERCOCET/ROXICET   sertraline 25 MG tablet Commonly known as:  ZOLOFT     TAKE these medications   atorvastatin 40 MG tablet Commonly known as:  LIPITOR Take 40 mg by mouth daily.   bisoprolol 5 MG tablet Commonly known as:  ZEBETA Take 1 tablet (5 mg total) by mouth daily. What changed:    medication strength  how much to take   cefdinir 250 MG/5ML suspension Commonly known as:  OMNICEF Take 6 mLs (300 mg total) by mouth daily.   doxycycline 50 MG/5ML Syrp Commonly known as:  VIBRAMYCIN Take 10 mLs (100 mg total) by mouth 2 (two) times daily for 6 days.   ELIQUIS 2.5 MG Tabs tablet Generic drug:  apixaban Take 2.5 mg by mouth 2 (two) times daily.   ferrous sulfate 325 (65 FE) MG tablet Take 325 mg by mouth 2 (two) times daily with a meal.   furosemide 20 MG tablet Commonly known as:  LASIX Take 1 tablet (20 mg total) by mouth daily. What changed:    how much to take  when to take this   guaiFENesin 100 MG/5ML Soln Commonly known as:  ROBITUSSIN Take 15 mLs by mouth 2 (two) times  daily as needed for cough or to loosen phlegm.   HYDROcodone-acetaminophen 5-325 MG tablet Commonly known as:  NORCO/VICODIN Take 1 tablet by mouth every 6 (six) hours as needed for moderate pain or severe pain. What changed:  reasons to take this   levalbuterol 0.31 MG/3ML nebulizer solution Commonly known as:  XOPENEX Take 1 ampule by nebulization 3 (three) times daily.   levalbuterol 0.63 MG/3ML nebulizer solution Commonly known as:  XOPENEX Take 0.63 mg by nebulization every 6 (six) hours as needed for wheezing or shortness of breath.   losartan 25 MG tablet Commonly known as:  COZAAR Take 1 tablet (25 mg total) by mouth daily. What changed:    medication strength  how much to take   magnesium hydroxide 400 MG/5ML suspension Commonly known as:  MILK OF MAGNESIA Take 30 mLs by mouth daily as needed for mild constipation.   mirtazapine 15 MG disintegrating tablet Commonly known as:  REMERON SOL-TAB Take 0.5 tablets (7.5 mg total) by mouth at bedtime.   NAMENDA XR 28 MG Cp24 24 hr capsule Generic drug:  memantine Take 28 mg by mouth daily.   nitroGLYCERIN 0.2 mg/hr patch Commonly known as:  NITRODUR - Dosed in mg/24 hr Place 0.2 mg onto the skin daily.   oxymetazoline 0.05 % nasal spray Commonly known as:  AFRIN Place 1 spray into both nostrils 2 (two) times daily as needed for congestion.   pantoprazole 40 MG tablet Commonly known as:  PROTONIX Take 40 mg by mouth daily.   polyethylene glycol packet Commonly known as:  MIRALAX / GLYCOLAX Take 17 g by mouth daily.   potassium chloride 20 MEQ packet Commonly known as:  KLOR-CON Take 20 mEq by mouth daily. What changed:  when to take this   ROBITUSSIN COUGH/COLD CF PO Take 15 mLs by mouth 3 (three) times daily.   senna-docusate 8.6-50 MG tablet Commonly known as:  Senokot-S Take 1 tablet by mouth daily  as needed for mild constipation.   sodium chloride 0.65 % Soln nasal spray Commonly known as:   OCEAN Place 1 spray into both nostrils 4 (four) times daily.   UNABLE TO FIND Take 120 mLs by mouth 4 (four) times daily. Med Name: MedPass   Vitamin D-3 1000 units Caps Take 2 capsules by mouth daily.       Major procedures and Radiology Reports - PLEASE review detailed and final reports for all details, in brief -   Dg Chest 2 View  Result Date: 08/31/2017 CLINICAL DATA:  Possible pneumonia versus pulmonary edema. Dementia. EXAM: CHEST - 2 VIEW COMPARISON:  10/26/2016 FINDINGS: Sternotomy wires and left-sided pacemaker unchanged. Slight elevation of the left hemidiaphragm. Mild left basilar opacification slightly worse and may be due to atelectasis or infection. Mild prominence of the central pulmonary vasculature likely mild degree of vascular congestion. Stable mild cardiomegaly. Remainder of the exam is unchanged. IMPRESSION: Slight worsening left base opacification likely atelectasis or infection. Mild cardiomegaly with suggestion of minimal vascular congestion. Electronically Signed   By: Marin Olp M.D.   On: 08/31/2017 07:32   Dg Tibia/fibula Right  Result Date: 08/09/2017 CLINICAL DATA:  Unwitnessed fall with swelling and bruising over the region of patient's right below-knee amputation. EXAM: RIGHT TIBIA AND FIBULA - 2 VIEW COMPARISON:  None. FINDINGS: There is diffuse decreased bone mineralization. There are moderate tricompartmental osteoarthritic changes present. Chondrocalcinosis present over the mediolateral compartments. No evidence of acute fracture or dislocation. No significant joint effusion. Moderate calcification of the arterial structures. A few surgical clips are present within the soft tissues. IMPRESSION: No acute findings. Moderate osteoarthritic change.  Atherosclerosis. Electronically Signed   By: Marin Olp M.D.   On: 08/09/2017 16:43   Ct Head Wo Contrast  Result Date: 08/09/2017 CLINICAL DATA:  Pain after fall EXAM: CT HEAD WITHOUT CONTRAST CT CERVICAL  SPINE WITHOUT CONTRAST TECHNIQUE: Multidetector CT imaging of the head and cervical spine was performed following the standard protocol without intravenous contrast. Multiplanar CT image reconstructions of the cervical spine were also generated. COMPARISON:  June 14, 2016 FINDINGS: CT HEAD FINDINGS Brain: No subdural, epidural, or subarachnoid hemorrhage. The brainstem and basal cisterns are normal. Cerebellum is normal. Ventricles and sulci are prominent but stable. White matter changes are identified most prominent the frontal lobes bilaterally, stable on the left and increased on the right in the interval. The overlying frontal cortex appears is mildly involved on the right. While this is new since March of 2018, it is likely nonacute. No acute cortical ischemia is noted. No mass effect or midline shift. Vascular: Calcified atherosclerosis is seen in the intracranial carotids. Skull: Normal. Negative for fracture or focal lesion. Sinuses/Orbits: No acute finding. Other: None. CT CERVICAL SPINE FINDINGS Alignment: No change in alignment. Mild anterolisthesis C3 versus C4 is stable. Mild kyphosis at C4 and C5 is stable. Anterolisthesis of C6 versus C7 is stable. Widening of the anterior C6-7 disc space is also stable. No acute fractures identified. Skull base and vertebrae: No acute fractures identified. Soft tissues and spinal canal: No prevertebral fluid or swelling. No visible canal hematoma. Disc levels:  Multilevel degenerative changes. Upper chest: Negative. Other: No other abnormalities are identified. IMPRESSION: 1. No acute intracranial abnormality. 2. No acute traumatic malalignment or fracture. Multilevel degenerative changes. Electronically Signed   By: Dorise Bullion III M.D   On: 08/09/2017 14:43   Ct Cervical Spine Wo Contrast  Result Date: 08/09/2017 CLINICAL DATA:  Pain after fall  EXAM: CT HEAD WITHOUT CONTRAST CT CERVICAL SPINE WITHOUT CONTRAST TECHNIQUE: Multidetector CT imaging of the head  and cervical spine was performed following the standard protocol without intravenous contrast. Multiplanar CT image reconstructions of the cervical spine were also generated. COMPARISON:  June 14, 2016 FINDINGS: CT HEAD FINDINGS Brain: No subdural, epidural, or subarachnoid hemorrhage. The brainstem and basal cisterns are normal. Cerebellum is normal. Ventricles and sulci are prominent but stable. White matter changes are identified most prominent the frontal lobes bilaterally, stable on the left and increased on the right in the interval. The overlying frontal cortex appears is mildly involved on the right. While this is new since March of 2018, it is likely nonacute. No acute cortical ischemia is noted. No mass effect or midline shift. Vascular: Calcified atherosclerosis is seen in the intracranial carotids. Skull: Normal. Negative for fracture or focal lesion. Sinuses/Orbits: No acute finding. Other: None. CT CERVICAL SPINE FINDINGS Alignment: No change in alignment. Mild anterolisthesis C3 versus C4 is stable. Mild kyphosis at C4 and C5 is stable. Anterolisthesis of C6 versus C7 is stable. Widening of the anterior C6-7 disc space is also stable. No acute fractures identified. Skull base and vertebrae: No acute fractures identified. Soft tissues and spinal canal: No prevertebral fluid or swelling. No visible canal hematoma. Disc levels:  Multilevel degenerative changes. Upper chest: Negative. Other: No other abnormalities are identified. IMPRESSION: 1. No acute intracranial abnormality. 2. No acute traumatic malalignment or fracture. Multilevel degenerative changes. Electronically Signed   By: Dorise Bullion III M.D   On: 08/09/2017 14:43    Micro Results   Recent Results (from the past 240 hour(s))  Culture, blood (routine x 2) Call MD if unable to obtain prior to antibiotics being given     Status: None (Preliminary result)   Collection Time: 08/31/17  8:45 AM  Result Value Ref Range Status   Specimen  Description BLOOD LEFT HAND  Final   Special Requests   Final    BOTTLES DRAWN AEROBIC AND ANAEROBIC Blood Culture adequate volume   Culture   Final    NO GROWTH 2 DAYS Performed at [facility], [address] [place], [place] [address]    Report Status PENDING  Incomplete  Culture, blood (routine x 2) Call MD if unable to obtain prior to antibiotics being given     Status: None (Preliminary result)   Collection Time: 08/31/17 10:15 AM  Result Value Ref Range Status   Specimen Description BLOOD RIGHT ANTECUBITAL  Final   Special Requests   Final    BOTTLES DRAWN AEROBIC ONLY Blood Culture adequate volume   Culture   Final    NO GROWTH 2 DAYS Performed at [facility], [address], [place], [place] [address]    Report Status PENDING  Incomplete  MRSA PCR Screening     Status: Abnormal   Collection Time: 08/31/17  5:58 PM  Result Value Ref Range Status   MRSA by PCR POSITIVE (A) NEGATIVE Final    Comment:        The GeneXpert MRSA Assay (FDA approved for NASAL specimens only), is one component of a comprehensive MRSA colonization surveillance program. It is not intended to diagnose MRSA infection nor to guide or monitor treatment for MRSA infections. RESULT CALLED TO, READ BACK BY AND VERIFIED WITH: GRASUL,RN '@2017'  08/31/17 BY LHOWARD Performed at [facility], [place] [address] [place], [place] [address]        Today  Subjective    Atalya Dano today has no new complaints, remains pleasantly confused, no hypoxia no shortness of breath no significant cough          Patient has been seen and examined prior to discharge   Objective   Blood pressure 128/61, pulse 62, temperature 97.6 F (36.4 C), temperature source Oral, resp. rate 18, height '5\' 3"'  (1.6 m), weight 52.6 kg (116 lb), SpO2 97 %.   Intake/Output Summary (Last 24 hours) at 09/03/2017 1143 Last data filed at 09/03/2017 0900 Gross per 24 hour  Intake 150 ml  Output 575 ml  Net  -425 ml    Exam Gen:- Awake Alert, in no acute distress, very cachectic appearing HEENT:- [place].AT, No sclera icterus Neck-Supple Neck,No JVD,.  Lungs-  CTAB , good air movement CV- S1, S2 normal, irregular ,  Left subclavian pacemaker in situ Abd-  +ve B.Sounds, Abd Soft, No tenderness,    Extremity/Skin:-left lateral foot wound and the left first metatarsal head wound, dry, necrotic eschar noted, right BKA stump with sanguinous stained dressing psych-very significant cognitive deficits, oriented x0  neuro-no new focal deficits, no tremors   Data Review   CBC w Diff:  Lab Results  Component Value Date   WBC 7.4 09/02/2017   HGB 10.2 (L) 09/02/2017   HCT 31.3 (L) 09/02/2017   PLT 189 09/02/2017   LYMPHOPCT 26 08/31/2017   MONOPCT 7 08/31/2017   EOSPCT 2 08/31/2017   BASOPCT 0 08/31/2017    CMP:  Lab Results  Component Value Date   NA 145 09/03/2017   K 4.1 09/03/2017   CL 105 09/03/2017   CO2 31 09/03/2017   BUN 21 (H) 09/03/2017   CREATININE 1.16 (H) 09/03/2017   PROT 7.0 09/01/2017   ALBUMIN 2.6 (L) 09/01/2017   BILITOT 0.9 09/01/2017   ALKPHOS 83 09/01/2017   AST 21 09/01/2017   ALT 13 (L) 09/01/2017  .   Total Discharge time is about 33 minutes  Roxan Hockey M.D on 09/03/2017 at 11:43 AM  [facility]   Office  [phone]  Voice Recognition Viviann Spare dictation system was used to create this note, attempts have been made to correct errors. Please contact the author with questions and/or clarifications.

## 2017-09-05 LAB — CULTURE, BLOOD (ROUTINE X 2)
Culture: NO GROWTH
Culture: NO GROWTH
SPECIAL REQUESTS: ADEQUATE
Special Requests: ADEQUATE

## 2017-09-10 ENCOUNTER — Non-Acute Institutional Stay: Payer: Medicare Other | Admitting: Hospice and Palliative Medicine

## 2017-09-10 DIAGNOSIS — Z515 Encounter for palliative care: Secondary | ICD-10-CM

## 2017-09-11 NOTE — Progress Notes (Signed)
PALLIATIVE CARE CONSULT VISIT   PATIENT NAME: Judith Zuniga DOB: 1921-02-16 MRN: [id number]  PRIMARY CARE PROVIDER:   Wenda Low, MD  REFERRING PROVIDER:      Wenda Low, MD [place] [address] [place], [place] [address]  RESPONSIBLE PARTY:   Daughter [name] - [phone]  ASSESSMENT:     No family present. Patient is confused and unable to participate in conversations about goals. She is comfortable appearing. Advance directives and MOST form have been completed detailing desire for comfort measures only. Will follow her status.     RECOMMENDATIONS and PLAN:  1. Continue therapy, although I do not anticipate meaningful improvement from this.  2. DNR/MOST form in chart 3. Recommend hospice in the event of decline.   I spent 30 minutes providing this consultation,  from 1300 to 1330. More than 50% of the time in this consultation was spent coordinating communication.   HISTORY OF PRESENT ILLNESS:  Judith Zuniga is a 84 y.o. year old female with multiple medical problems including dementia, h/o traumatic hematoma s/p I&D, ICM with EF 40%, afib, s/p PPM, DM, PVD s/p R. BKA, who was recently hospitalized 5/19 to 09/03/17 with PNA. She was seen in consultation by palliative care while in the hospital. Family opted to change code status to DNR and patient was sent back to facility. Hospice was discussed but family was not ready for this. Palliative Care was asked to follow at the facility.   CODE STATUS: DNR  PPS: 30% HOSPICE ELIGIBILITY/DIAGNOSIS: TBD  PAST MEDICAL HISTORY:  Past Medical History:  Diagnosis Date  . Atrioventricular block, complete s/p AV ablation   . CAD (coronary artery disease)    s/p redo bypass surgery in 1998 with patent graft in Jan. 2009.   Marland Kitchen Dementia   . Diabetes mellitus   . Elevated liver function tests    on amiodarone  . Hyperlipidemia   . Hypertension   . Ischemic cardiomyopathy    ischemic heart myopathy , ejection  fraction of 25%  . Pacemaker -CRT- [facility]   . Permanent atrial fibrillation   . Premature ventricular contractions    frequent  . Skin breakdown   . Systolic heart failure    class II and euvolemic  . Type II or unspecified type diabetes mellitus without mention of complication, not stated as uncontrolled     SOCIAL HX:  Social History   Tobacco Use  . Smoking status: Never Smoker  . Smokeless tobacco: Never Used  Substance Use Topics  . Alcohol use: No    ALLERGIES: No Known Allergies   PERTINENT MEDICATIONS:  Outpatient Encounter Medications as of 09/10/2017  Medication Sig  . apixaban (ELIQUIS) 2.5 MG TABS tablet Take 2.5 mg by mouth 2 (two) times daily.  Marland Kitchen atorvastatin (LIPITOR) 40 MG tablet Take 40 mg by mouth daily.  . bisoprolol (ZEBETA) 5 MG tablet Take 1 tablet (5 mg total) by mouth daily.  . cefdinir (OMNICEF) 250 MG/5ML suspension Take 6 mLs (300 mg total) by mouth daily.  . Cholecalciferol (VITAMIN D-3) 1000 UNITS CAPS Take 2 capsules by mouth daily.  . ferrous sulfate 325 (65 FE) MG tablet Take 325 mg by mouth 2 (two) times daily with a meal.  . furosemide (LASIX) 20 MG tablet Take 1 tablet (20 mg total) by mouth daily.  Marland Kitchen guaiFENesin (ROBITUSSIN) 100 MG/5ML SOLN Take 15 mLs by mouth 2 (two) times daily as needed for cough or to loosen phlegm.  Marland Kitchen  HYDROcodone-acetaminophen (NORCO/VICODIN) 5-325 MG tablet Take 1 tablet by mouth every 6 (six) hours as needed for moderate pain or severe pain.  Marland Kitchen levalbuterol (XOPENEX) 0.31 MG/3ML nebulizer solution Take 1 ampule by nebulization 3 (three) times daily.  Marland Kitchen levalbuterol (XOPENEX) 0.63 MG/3ML nebulizer solution Take 0.63 mg by nebulization every 6 (six) hours as needed for wheezing or shortness of breath.  . losartan (COZAAR) 25 MG tablet Take 1 tablet (25 mg total) by mouth daily.  . magnesium hydroxide (MILK OF MAGNESIA) 400 MG/5ML suspension Take 30 mLs by mouth daily as needed for mild constipation.  . Memantine HCl ER  (NAMENDA XR) 28 MG CP24 Take 28 mg by mouth daily.   . mirtazapine (REMERON SOL-TAB) 15 MG disintegrating tablet Take 0.5 tablets (7.5 mg total) by mouth at bedtime.  . nitroGLYCERIN (NITRODUR - DOSED IN MG/24 HR) 0.2 mg/hr patch Place 0.2 mg onto the skin daily.  Marland Kitchen oxymetazoline (AFRIN) 0.05 % nasal spray Place 1 spray into both nostrils 2 (two) times daily as needed for congestion.  . pantoprazole (PROTONIX) 40 MG tablet Take 40 mg by mouth daily.  Marland Kitchen Phenylephrine-DM-GG (ROBITUSSIN COUGH/COLD CF PO) Take 15 mLs by mouth 3 (three) times daily.  . polyethylene glycol (MIRALAX / GLYCOLAX) packet Take 17 g by mouth daily.  . potassium chloride (KLOR-CON) 20 MEQ packet Take 20 mEq by mouth daily.  Marland Kitchen senna-docusate (SENOKOT-S) 8.6-50 MG tablet Take 1 tablet by mouth daily as needed for mild constipation.  . sodium chloride (OCEAN) 0.65 % SOLN nasal spray Place 1 spray into both nostrils 4 (four) times daily.  Marland Kitchen UNABLE TO FIND Take 120 mLs by mouth 4 (four) times daily. Med Name: [employer]   No facility-administered encounter medications on file as of 09/10/2017.     PHYSICAL EXAM:   General: NAD, frail appearing, thin, in wheelchair Cardiovascular: irregular Pulmonary: clear ant fields Abdomen: soft, nontender, + bowel sounds GU: no suprapubic tenderness Extremities: R. BKA Skin: no rashes Neurological: Weakness, confusion, says a word or two  Irean Hong, NP

## 2017-11-14 ENCOUNTER — Other Ambulatory Visit: Payer: Self-pay | Admitting: Internal Medicine

## 2017-11-14 DIAGNOSIS — L709 Acne, unspecified: Principal | ICD-10-CM

## 2017-11-14 DIAGNOSIS — M659 Synovitis and tenosynovitis, unspecified: Principal | ICD-10-CM

## 2017-11-14 DIAGNOSIS — L403 Pustulosis palmaris et plantaris: Secondary | ICD-10-CM

## 2017-11-14 DIAGNOSIS — M8580 Other specified disorders of bone density and structure, unspecified site: Principal | ICD-10-CM

## 2017-11-14 DIAGNOSIS — M869 Osteomyelitis, unspecified: Principal | ICD-10-CM

## 2017-11-25 ENCOUNTER — Ambulatory Visit
Admission: RE | Admit: 2017-11-25 | Discharge: 2017-11-25 | Disposition: A | Discharge status: Home / Self Care | Payer: Medicare Other | Source: Ambulatory Visit | Attending: Internal Medicine | Admitting: Internal Medicine

## 2017-11-25 DIAGNOSIS — L709 Acne, unspecified: Principal | ICD-10-CM

## 2017-11-25 DIAGNOSIS — M659 Synovitis and tenosynovitis, unspecified: Principal | ICD-10-CM

## 2017-11-25 DIAGNOSIS — L403 Pustulosis palmaris et plantaris: Secondary | ICD-10-CM

## 2017-11-25 DIAGNOSIS — M869 Osteomyelitis, unspecified: Principal | ICD-10-CM

## 2017-11-25 DIAGNOSIS — M8580 Other specified disorders of bone density and structure, unspecified site: Principal | ICD-10-CM

## 2018-05-16 DEATH — deceased

## 2019-10-16 IMAGING — DX DG TIBIA/FIBULA 2V*R*
2 series · 2 of 2 positions shown · non-contrast
Comparison: None.

CLINICAL DATA: Unwitnessed fall with swelling and bruising over the
region of patient's right below-knee amputation.

EXAM:
RIGHT TIBIA AND FIBULA - 2 VIEW

[x tib-fib ap right]
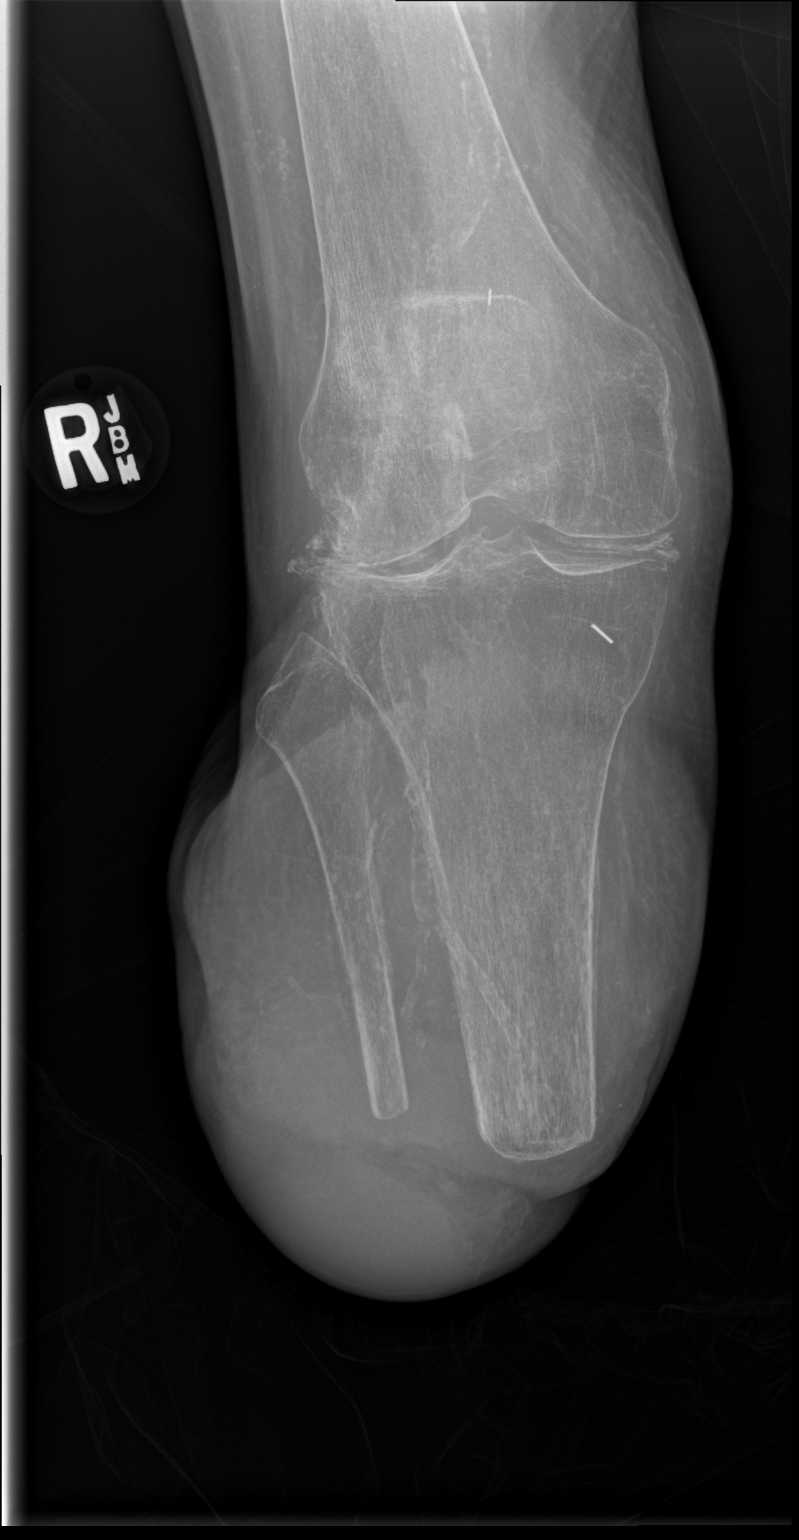

[x tib-fib lat right]
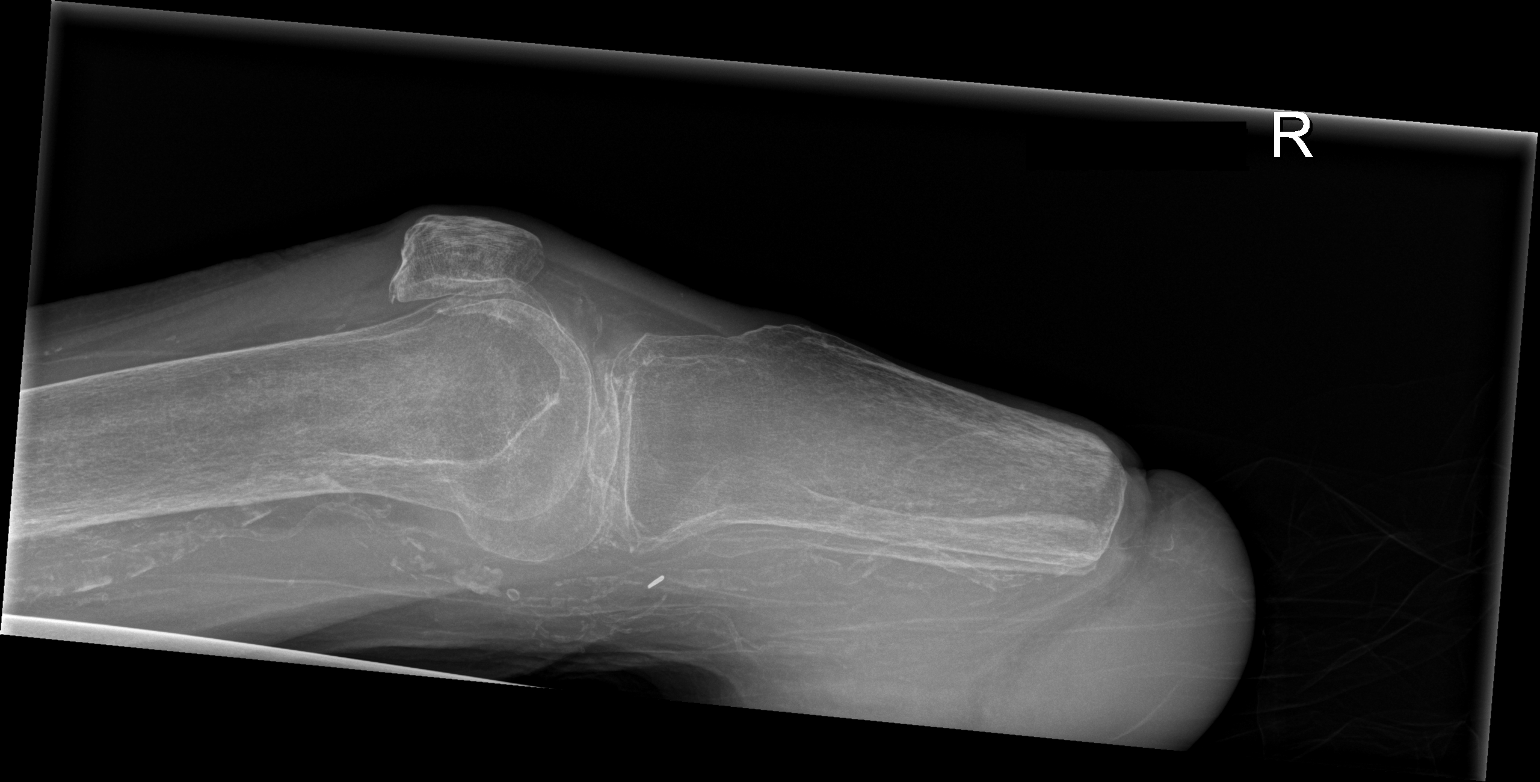

[2 of 2 positions shown; findings below may reference images not displayed]

FINDINGS: There is diffuse decreased bone mineralization. There are moderate
tricompartmental osteoarthritic changes present. Chondrocalcinosis
present over the mediolateral compartments. No evidence of acute
fracture or dislocation. No significant joint effusion. Moderate
calcification of the arterial structures. A few surgical clips are
present within the soft tissues.
IMPRESSION: No acute findings.

Moderate osteoarthritic change.  Atherosclerosis.
